# Patient Record
Sex: Male | Born: 1935 | Race: White | Hispanic: No | Marital: Married | Smoking: Former smoker
Health system: Southern US, Community
[De-identification: ages and names within clinical notes are randomized; demographics above are authoritative.]

## PROBLEM LIST (undated history)

## (undated) DIAGNOSIS — I5042 Chronic combined systolic (congestive) and diastolic (congestive) heart failure: Secondary | ICD-10-CM

## (undated) DIAGNOSIS — E785 Hyperlipidemia, unspecified: Secondary | ICD-10-CM

## (undated) DIAGNOSIS — N183 Chronic kidney disease, stage 3 unspecified: Secondary | ICD-10-CM

## (undated) DIAGNOSIS — K219 Gastro-esophageal reflux disease without esophagitis: Secondary | ICD-10-CM

## (undated) DIAGNOSIS — S68119A Complete traumatic metacarpophalangeal amputation of unspecified finger, initial encounter: Secondary | ICD-10-CM

## (undated) DIAGNOSIS — D638 Anemia in other chronic diseases classified elsewhere: Secondary | ICD-10-CM

## (undated) DIAGNOSIS — Z8719 Personal history of other diseases of the digestive system: Secondary | ICD-10-CM

## (undated) DIAGNOSIS — R410 Disorientation, unspecified: Secondary | ICD-10-CM

## (undated) DIAGNOSIS — G510 Bell's palsy: Secondary | ICD-10-CM

## (undated) DIAGNOSIS — I10 Essential (primary) hypertension: Secondary | ICD-10-CM

## (undated) DIAGNOSIS — I4821 Permanent atrial fibrillation: Secondary | ICD-10-CM

## (undated) DIAGNOSIS — J449 Chronic obstructive pulmonary disease, unspecified: Secondary | ICD-10-CM

## (undated) DIAGNOSIS — E119 Type 2 diabetes mellitus without complications: Secondary | ICD-10-CM

## (undated) DIAGNOSIS — J90 Pleural effusion, not elsewhere classified: Secondary | ICD-10-CM

## (undated) DIAGNOSIS — R0789 Other chest pain: Secondary | ICD-10-CM

## (undated) HISTORY — DX: Personal history of other diseases of the digestive system: Z87.19

## (undated) HISTORY — PX: APPENDECTOMY: SHX54

## (undated) HISTORY — DX: Type 2 diabetes mellitus without complications: E11.9

## (undated) HISTORY — DX: Bell's palsy: G51.0

## (undated) HISTORY — DX: Essential (primary) hypertension: I10

## (undated) HISTORY — DX: Chronic combined systolic (congestive) and diastolic (congestive) heart failure: I50.42

## (undated) HISTORY — DX: Hyperlipidemia, unspecified: E78.5

## (undated) HISTORY — DX: Other chest pain: R07.89

## (undated) HISTORY — DX: Chronic kidney disease, stage 3 unspecified: N18.30

## (undated) HISTORY — DX: Chronic kidney disease, stage 3 (moderate): N18.3

## (undated) HISTORY — DX: Complete traumatic metacarpophalangeal amputation of unspecified finger, initial encounter: S68.119A

## (undated) HISTORY — PX: FINGER SURGERY: SHX640

## (undated) HISTORY — DX: Pleural effusion, not elsewhere classified: J90

## (undated) HISTORY — DX: Permanent atrial fibrillation: I48.21

## (undated) HISTORY — DX: Gastro-esophageal reflux disease without esophagitis: K21.9

## (undated) HISTORY — DX: Anemia in other chronic diseases classified elsewhere: D63.8

## (undated) HISTORY — PX: ESOPHAGOGASTRODUODENOSCOPY: SHX1529

---

## 2004-01-12 ENCOUNTER — Ambulatory Visit: Payer: Self-pay | Admitting: Family Medicine

## 2004-06-11 ENCOUNTER — Ambulatory Visit: Payer: Self-pay | Admitting: Family Medicine

## 2004-09-11 ENCOUNTER — Ambulatory Visit: Payer: Self-pay

## 2005-03-28 ENCOUNTER — Ambulatory Visit: Payer: Self-pay | Admitting: Family Medicine

## 2005-06-26 ENCOUNTER — Ambulatory Visit: Payer: Self-pay | Admitting: Family Medicine

## 2005-10-07 ENCOUNTER — Ambulatory Visit: Payer: Self-pay | Admitting: Family Medicine

## 2005-12-16 ENCOUNTER — Ambulatory Visit: Payer: Self-pay

## 2006-04-30 ENCOUNTER — Ambulatory Visit: Payer: Self-pay | Admitting: Family Medicine

## 2006-08-19 ENCOUNTER — Ambulatory Visit: Payer: Self-pay | Admitting: Family Medicine

## 2006-09-11 ENCOUNTER — Ambulatory Visit: Payer: Self-pay | Admitting: Family Medicine

## 2006-09-23 ENCOUNTER — Ambulatory Visit: Payer: Self-pay | Admitting: Family Medicine

## 2006-10-24 ENCOUNTER — Ambulatory Visit: Payer: Self-pay | Admitting: Family Medicine

## 2007-01-05 ENCOUNTER — Ambulatory Visit: Payer: Self-pay | Admitting: Family Medicine

## 2007-04-01 ENCOUNTER — Ambulatory Visit: Payer: Self-pay | Admitting: Family Medicine

## 2008-10-31 ENCOUNTER — Other Ambulatory Visit: Payer: Self-pay | Admitting: Family Medicine

## 2008-12-21 ENCOUNTER — Ambulatory Visit: Payer: Self-pay | Admitting: Gastroenterology

## 2009-03-01 ENCOUNTER — Ambulatory Visit: Payer: Self-pay | Admitting: Gastroenterology

## 2009-04-06 ENCOUNTER — Other Ambulatory Visit: Payer: Self-pay | Admitting: Family Medicine

## 2009-04-20 ENCOUNTER — Other Ambulatory Visit: Payer: Self-pay | Admitting: Family Medicine

## 2009-04-25 ENCOUNTER — Ambulatory Visit: Payer: Self-pay | Admitting: Internal Medicine

## 2009-08-04 ENCOUNTER — Other Ambulatory Visit: Payer: Self-pay | Admitting: Family Medicine

## 2010-04-13 ENCOUNTER — Other Ambulatory Visit: Payer: Self-pay | Admitting: Family Medicine

## 2010-07-13 ENCOUNTER — Emergency Department: Payer: Self-pay | Admitting: Emergency Medicine

## 2010-12-17 ENCOUNTER — Other Ambulatory Visit: Payer: Self-pay | Admitting: Family Medicine

## 2010-12-21 ENCOUNTER — Emergency Department: Payer: Self-pay | Admitting: Emergency Medicine

## 2011-09-23 HISTORY — PX: COLONOSCOPY: SHX174

## 2011-10-11 ENCOUNTER — Ambulatory Visit: Payer: Self-pay | Admitting: Gastroenterology

## 2011-12-29 ENCOUNTER — Inpatient Hospital Stay: Payer: Self-pay | Admitting: Internal Medicine

## 2011-12-29 LAB — URINALYSIS, COMPLETE
Bacteria: NONE SEEN
Bilirubin,UR: NEGATIVE
Glucose,UR: 100 mg/dL (ref 0–75)
Leukocyte Esterase: NEGATIVE
Nitrite: NEGATIVE
RBC,UR: 4 /HPF (ref 0–5)
Specific Gravity: 1.02 (ref 1.003–1.030)
Squamous Epithelial: <1

## 2011-12-29 LAB — CK TOTAL AND CKMB (NOT AT ARMC)
CK, Total: 160 U/L (ref 35–232)
CK-MB: 1.5 ng/mL (ref 0.5–3.6)

## 2011-12-29 LAB — COMPREHENSIVE METABOLIC PANEL
Albumin: 3.2 g/dL — ABNORMAL LOW (ref 3.4–5.0)
Alkaline Phosphatase: 79 U/L (ref 50–136)
Anion Gap: 9 (ref 7–16)
BUN: 17 mg/dL (ref 7–18)
Bilirubin,Total: 0.8 mg/dL (ref 0.2–1.0)
Creatinine: 1.7 mg/dL — ABNORMAL HIGH (ref 0.60–1.30)
EGFR (African American): 44 — ABNORMAL LOW
SGOT(AST): 21 U/L (ref 15–37)
SGPT (ALT): 21 U/L (ref 12–78)
Sodium: 140 mmol/L (ref 136–145)
Total Protein: 7.2 g/dL (ref 6.4–8.2)

## 2011-12-29 LAB — TSH: Thyroid Stimulating Horm: 0.374 u[IU]/mL — ABNORMAL LOW

## 2011-12-29 LAB — T4, FREE: Free Thyroxine: 0.98 ng/dL (ref 0.76–1.46)

## 2011-12-29 LAB — CBC
HGB: 13.8 g/dL (ref 13.0–18.0)
MCH: 31.1 pg (ref 26.0–34.0)
RBC: 4.44 10*6/uL (ref 4.40–5.90)
WBC: 7.6 10*3/uL (ref 3.8–10.6)

## 2011-12-29 LAB — PROTIME-INR: Prothrombin Time: 13.4 secs (ref 11.5–14.7)

## 2011-12-30 DIAGNOSIS — I4891 Unspecified atrial fibrillation: Secondary | ICD-10-CM

## 2011-12-30 LAB — LIPID PANEL
Cholesterol: 97 mg/dL (ref 0–200)
HDL Cholesterol: 26 mg/dL — ABNORMAL LOW (ref 40–60)
Ldl Cholesterol, Calc: 48 mg/dL (ref 0–100)
Triglycerides: 116 mg/dL (ref 0–200)
VLDL Cholesterol, Calc: 23 mg/dL (ref 5–40)

## 2011-12-30 LAB — CBC WITH DIFFERENTIAL/PLATELET
Basophil %: 0.8 %
Eosinophil %: 0.3 %
HCT: 38.6 % — ABNORMAL LOW (ref 40.0–52.0)
Lymphocyte #: 0.8 10*3/uL — ABNORMAL LOW (ref 1.0–3.6)
MCH: 30.8 pg (ref 26.0–34.0)
MCV: 88 fL (ref 80–100)
Monocyte %: 9 %
Neutrophil #: 7.1 10*3/uL — ABNORMAL HIGH (ref 1.4–6.5)
Platelet: 190 10*3/uL (ref 150–440)
RBC: 4.38 10*6/uL — ABNORMAL LOW (ref 4.40–5.90)

## 2011-12-30 LAB — BASIC METABOLIC PANEL
BUN: 18 mg/dL (ref 7–18)
Calcium, Total: 8.2 mg/dL — ABNORMAL LOW (ref 8.5–10.1)
Chloride: 99 mmol/L (ref 98–107)
Co2: 26 mmol/L (ref 21–32)
Osmolality: 278 (ref 275–301)
Potassium: 3.7 mmol/L (ref 3.5–5.1)
Sodium: 136 mmol/L (ref 136–145)

## 2011-12-30 LAB — HEMOGLOBIN A1C: Hemoglobin A1C: 8.1 % — ABNORMAL HIGH (ref 4.2–6.3)

## 2011-12-30 LAB — CK TOTAL AND CKMB (NOT AT ARMC)
CK, Total: 118 U/L (ref 35–232)
CK-MB: 0.9 ng/mL (ref 0.5–3.6)

## 2011-12-30 LAB — URINE CULTURE

## 2011-12-30 LAB — RAPID INFLUENZA A&B ANTIGENS

## 2011-12-30 LAB — TROPONIN I: Troponin-I: 0.04 ng/mL

## 2011-12-31 LAB — MAGNESIUM: Magnesium: 1.9 mg/dL

## 2011-12-31 LAB — BASIC METABOLIC PANEL
Anion Gap: 12 (ref 7–16)
Calcium, Total: 8.4 mg/dL — ABNORMAL LOW (ref 8.5–10.1)
Co2: 25 mmol/L (ref 21–32)
Creatinine: 1.61 mg/dL — ABNORMAL HIGH (ref 0.60–1.30)
EGFR (African American): 47 — ABNORMAL LOW
Glucose: 177 mg/dL — ABNORMAL HIGH (ref 65–99)
Osmolality: 279 (ref 275–301)
Potassium: 3.2 mmol/L — ABNORMAL LOW (ref 3.5–5.1)

## 2012-01-01 LAB — MAGNESIUM: Magnesium: 2.5 mg/dL — ABNORMAL HIGH

## 2012-01-01 LAB — CBC WITH DIFFERENTIAL/PLATELET
Basophil #: 0 10*3/uL (ref 0.0–0.1)
Eosinophil #: 0.1 10*3/uL (ref 0.0–0.7)
Eosinophil %: 2.4 %
HGB: 14.4 g/dL (ref 13.0–18.0)
Lymphocyte #: 1.1 10*3/uL (ref 1.0–3.6)
MCH: 31.3 pg (ref 26.0–34.0)
MCHC: 35.2 g/dL (ref 32.0–36.0)
MCV: 89 fL (ref 80–100)
Monocyte #: 0.7 x10 3/mm (ref 0.2–1.0)
Neutrophil %: 61.4 %
Platelet: 211 10*3/uL (ref 150–440)
RBC: 4.6 10*6/uL (ref 4.40–5.90)
RDW: 14.4 % (ref 11.5–14.5)

## 2012-01-01 LAB — BASIC METABOLIC PANEL
Anion Gap: 9 (ref 7–16)
Calcium, Total: 8.8 mg/dL (ref 8.5–10.1)
Creatinine: 1.72 mg/dL — ABNORMAL HIGH (ref 0.60–1.30)
EGFR (Non-African Amer.): 37 — ABNORMAL LOW
Glucose: 198 mg/dL — ABNORMAL HIGH (ref 65–99)
Osmolality: 290 (ref 275–301)
Potassium: 3.5 mmol/L (ref 3.5–5.1)

## 2012-01-05 LAB — CULTURE, BLOOD (SINGLE)

## 2012-01-15 ENCOUNTER — Ambulatory Visit (INDEPENDENT_AMBULATORY_CARE_PROVIDER_SITE_OTHER): Payer: Medicare Other | Admitting: Cardiovascular Disease

## 2012-01-15 ENCOUNTER — Encounter: Payer: Self-pay | Admitting: Cardiovascular Disease

## 2012-01-15 VITALS — BP 142/58 | HR 68 | Ht 68.0 in | Wt 214.0 lb

## 2012-01-15 DIAGNOSIS — I4819 Other persistent atrial fibrillation: Secondary | ICD-10-CM | POA: Insufficient documentation

## 2012-01-15 DIAGNOSIS — E119 Type 2 diabetes mellitus without complications: Secondary | ICD-10-CM | POA: Insufficient documentation

## 2012-01-15 DIAGNOSIS — I10 Essential (primary) hypertension: Secondary | ICD-10-CM | POA: Insufficient documentation

## 2012-01-15 DIAGNOSIS — E785 Hyperlipidemia, unspecified: Secondary | ICD-10-CM

## 2012-01-15 DIAGNOSIS — E782 Mixed hyperlipidemia: Secondary | ICD-10-CM | POA: Insufficient documentation

## 2012-01-15 DIAGNOSIS — I4891 Unspecified atrial fibrillation: Secondary | ICD-10-CM

## 2012-01-15 NOTE — Progress Notes (Signed)
Patient ID: Patrick [name], male    DOB: 1931/11/02, 78 y.o.   MRN: [id number]  HPI Comments: Mr. Krishnamoorthy is a very pleasant 78 year old gentleman with history of hypertension, diabetes, hyperlipidemia, GERD who presented recently to the hospital October 7 with weakness, dizziness, found to be in atrial fibrillation with RVR. He was started on rate controlling medications and discharged on Cardizem 300 mg daily with anticoagulation (xarelto). He presents to establish care in our office.  He reports that he feels very well. He has been walking with his wife and denies any significant shortness of breath, edema, palpitations or tachycardia. He reports his blood pressure has been well-controlled at home. Overall he has no complaints.  CT scan of the head in the hospital showed no intracranial process Hemoglobin A1c 8.1 Total cholesterol 97, LDL 48 Creatinine 1.61, BUN 30  Echocardiogram showed ejection fraction greater than 55%, mildly dilated left atrium, otherwise normal study  MRI of the brain showed chronic changes without evidence of acute abnormalities  EKG today shows atrial fibrillation with ventricular rate 68 minute, poor R-wave progression through the anterior precordial leads   Outpatient Encounter Prescriptions as of 01/15/2012  Medication Sig Dispense Refill  . aspirin 81 MG tablet Take 81 mg by mouth daily.      Marland Kitchen diltiazem (TIAZAC) 300 MG 24 hr capsule Take 300 mg by mouth daily.      . enalapril (VASOTEC) 20 MG tablet Take 20 mg by mouth daily.      Marland Kitchen glipiZIDE (GLUCOTROL) 10 MG tablet Take 10 mg by mouth 2 (two) times daily before a meal.       . hydrALAZINE (APRESOLINE) 50 MG tablet Take 50 mg by mouth 3 (three) times daily.      . hydrochlorothiazide (HYDRODIURIL) 25 MG tablet Take 25 mg by mouth daily.      . insulin lispro protamine-insulin lispro (HUMALOG 50/50) (50-50) 100 UNIT/ML SUSP Inject 66 Units into the skin 2 (two) times daily before a meal.      .  levofloxacin (LEVAQUIN) 750 MG tablet Take 750 mg by mouth daily.      Marland Kitchen omeprazole (PRILOSEC) 40 MG capsule Take 40 mg by mouth daily.      . pravastatin (PRAVACHOL) 40 MG tablet Take 40 mg by mouth daily.      . Rivaroxaban (XARELTO) 15 MG TABS tablet Take 15 mg by mouth daily.        Review of Systems  Constitutional: Negative.   HENT: Negative.   Eyes: Negative.   Respiratory: Negative.   Cardiovascular: Negative.   Gastrointestinal: Negative.   Musculoskeletal: Negative.   Skin: Negative.   Neurological: Negative.   Hematological: Negative.   Psychiatric/Behavioral: Negative.   All other systems reviewed and are negative.    BP 142/58  Pulse 68  Ht 5\' 8"  (1.727 m)  Wt 214 lb (97.07 kg)  BMI 32.54 kg/m2  Physical Exam  Nursing note and vitals reviewed. Constitutional: He is oriented to person, place, and time. He appears well-developed and well-nourished.  HENT:  Head: Normocephalic.  Nose: Nose normal.  Mouth/Throat: Oropharynx is clear and moist.  Eyes: Conjunctivae normal are normal. Pupils are equal, round, and reactive to light.  Neck: Normal range of motion. Neck supple. No JVD present.  Cardiovascular: Normal rate, S1 normal, S2 normal, normal heart sounds and intact distal pulses.  An irregularly irregular rhythm present. Exam reveals no gallop and no friction rub.   No murmur heard. Pulmonary/Chest: Effort  normal and breath sounds normal. No respiratory distress. He has no wheezes. He has no rales. He exhibits no tenderness.  Abdominal: Soft. Bowel sounds are normal. He exhibits no distension. There is no tenderness.  Musculoskeletal: Normal range of motion. He exhibits no edema and no tenderness.  Lymphadenopathy:    He has no cervical adenopathy.  Neurological: He is alert and oriented to person, place, and time. Coordination normal.  Skin: Skin is warm and dry. No rash noted. No erythema.  Psychiatric: He has a normal mood and affect. His behavior is  normal. Judgment and thought content normal.           Assessment and Plan

## 2012-01-15 NOTE — Assessment & Plan Note (Signed)
We have encouraged continued exercise, careful diet management in an effort to lose weight. He has started walking on a regular basis.

## 2012-01-15 NOTE — Assessment & Plan Note (Signed)
Cholesterol is at goal on the current lipid regimen. No changes to the medications were made.  

## 2012-01-15 NOTE — Assessment & Plan Note (Signed)
Rate is well controlled. He is on anticoagulation. We will meet again with him in one month to discuss options. He has 3 options including medical management as he is doing now, attempted cardioversion pharmacologically, or DC cardioversion. He is currently asymptomatic.

## 2012-01-15 NOTE — Assessment & Plan Note (Signed)
Blood pressure is well controlled on today's visit. No changes made to the medications. 

## 2012-01-15 NOTE — Patient Instructions (Addendum)
You are doing well. No medication changes were made. Call the office for dizziness, shortness of breath, edema  Please call us if you have new issues that need to be addressed before your next appt.  Your physician wants you to follow-up in: 1 months.

## 2012-01-20 ENCOUNTER — Encounter: Payer: Self-pay | Admitting: Cardiovascular Disease

## 2012-01-22 ENCOUNTER — Encounter: Payer: Self-pay | Admitting: Cardiovascular Disease

## 2012-02-11 ENCOUNTER — Encounter: Payer: Self-pay | Admitting: *Deleted

## 2012-02-17 ENCOUNTER — Encounter: Payer: Self-pay | Admitting: Cardiovascular Disease

## 2012-02-17 ENCOUNTER — Ambulatory Visit (INDEPENDENT_AMBULATORY_CARE_PROVIDER_SITE_OTHER): Payer: Medicare Other | Admitting: Cardiovascular Disease

## 2012-02-17 VITALS — BP 160/78 | HR 59 | Ht 68.0 in | Wt 219.4 lb

## 2012-02-17 DIAGNOSIS — E785 Hyperlipidemia, unspecified: Secondary | ICD-10-CM

## 2012-02-17 DIAGNOSIS — E119 Type 2 diabetes mellitus without complications: Secondary | ICD-10-CM

## 2012-02-17 DIAGNOSIS — I4891 Unspecified atrial fibrillation: Secondary | ICD-10-CM

## 2012-02-17 DIAGNOSIS — I10 Essential (primary) hypertension: Secondary | ICD-10-CM

## 2012-02-17 NOTE — Assessment & Plan Note (Signed)
We have encouraged continued exercise, careful diet management in an effort to lose weight. 

## 2012-02-17 NOTE — Progress Notes (Signed)
Patient ID: Patrick [name], male    DOB: 09/15/1931, 78 y.o.   MRN: [id number]  HPI Comments: Patrick [name] is a very pleasant 78 year old gentleman with history of hypertension, diabetes, hyperlipidemia, GERD who presented recently to the hospital December 30 2011 with weakness, dizziness, found to be in atrial fibrillation with RVR. He was started on rate controlling medications and discharged on Cardizem 300 mg daily with anticoagulation (xarelto). He presents for routine followup  He reports that he feels very well. He has been walking with his wife and denies any significant shortness of breath, edema, palpitations or tachycardia.Overall he has no complaints.  He was recently seen by Dr. [name] and told to stop his HCTZ. Instead he stopped his hydralazine. He was taking 50 mg 3 times a day. He does not have any HCTZ in a bag of medications. He does report mild lower extremity edema.  CT scan of the head in the hospital showed no intracranial process Lab work October 2013, creatinine 2.2, BUN 34 Lab work August 2013 , Hemoglobin A1c 8.1 Total cholesterol 97, LDL 48  Echocardiogram showed ejection fraction greater than 55%, mildly dilated left atrium, otherwise normal study MRI of the brain showed chronic changes without evidence of acute abnormalities  EKG today shows atrial fibrillation with ventricular rate 59 minute, nonspecific ST abnormality   Outpatient Encounter Prescriptions as of 02/17/2012  Medication Sig Dispense Refill  . aspirin 81 MG tablet Take 81 mg by mouth daily.      Marland Kitchen diltiazem (TIAZAC) 300 MG 24 hr capsule Take 300 mg by mouth daily.      . enalapril (VASOTEC) 20 MG tablet Take 20 mg by mouth daily.      Marland Kitchen glipiZIDE (GLUCOTROL) 10 MG tablet Take 10 mg by mouth 2 (two) times daily before a meal.       . insulin lispro protamine-insulin lispro (HUMALOG 50/50) (50-50) 100 UNIT/ML SUSP Inject 66 Units into the skin 2 (two) times daily before a meal.      .  omeprazole (PRILOSEC) 40 MG capsule Take 40 mg by mouth daily.      . pravastatin (PRAVACHOL) 40 MG tablet Take 40 mg by mouth daily.      . Rivaroxaban (XARELTO) 15 MG TABS tablet Take 15 mg by mouth daily.      . hydrALAZINE (APRESOLINE) 50 MG tablet Take 1 tablet (50 mg total) by mouth 3 (three) times daily. He held this by accident recently   270 tablet  3    Review of Systems  Constitutional: Negative.   HENT: Negative.   Eyes: Negative.   Respiratory: Negative.   Cardiovascular: Negative.   Gastrointestinal: Negative.   Musculoskeletal: Negative.   Skin: Negative.   Neurological: Negative.   Hematological: Negative.   Psychiatric/Behavioral: Negative.   All other systems reviewed and are negative.    BP 160/78  Pulse 59  Ht 5\' 8"  (1.727 m)  Wt 219 lb 6.4 oz (99.519 kg)  BMI 33.36 kg/m2  Physical Exam  Nursing note and vitals reviewed. Constitutional: He is oriented to person, place, and time. He appears well-developed and well-nourished.  HENT:  Head: Normocephalic.  Nose: Nose normal.  Mouth/Throat: Oropharynx is clear and moist.  Eyes: Conjunctivae normal are normal. Pupils are equal, round, and reactive to light.  Neck: Normal range of motion. Neck supple. No JVD present.  Cardiovascular: Normal rate, S1 normal, S2 normal, normal heart sounds and intact distal pulses.  An irregularly irregular rhythm present.  Exam reveals no gallop and no friction rub.   No murmur heard.      Trace bilateral LE edema   Pulmonary/Chest: Effort normal and breath sounds normal. No respiratory distress. He has no wheezes. He has no rales. He exhibits no tenderness.  Abdominal: Soft. Bowel sounds are normal. He exhibits no distension. There is no tenderness.  Musculoskeletal: Normal range of motion. He exhibits no edema and no tenderness.  Lymphadenopathy:    He has no cervical adenopathy.  Neurological: He is alert and oriented to person, place, and time. Coordination normal.    Skin: Skin is warm and dry. No rash noted. No erythema.  Psychiatric: He has a normal mood and affect. His behavior is normal. Judgment and thought content normal.           Assessment and Plan

## 2012-02-17 NOTE — Patient Instructions (Addendum)
You are doing well. Please go back on hydralazine three times a day  If you get worsening edema, call the office   Please call us if you have new issues that need to be addressed before your next appt.  Your physician wants you to follow-up in: 3 months.  You will receive a reminder letter in the mail two months in advance. If you don't receive a letter, please call our office to schedule the follow-up appointment.

## 2012-02-17 NOTE — Assessment & Plan Note (Signed)
Cholesterol is at goal on the current lipid regimen. No changes to the medications were made.  

## 2012-02-17 NOTE — Assessment & Plan Note (Signed)
Blood pressure is elevated today. We have suggested he restart hydralazine 50 mg 3 times a day. He accidentally stopped this medication thinking it was HCTZ. He was told to stop the HCTZ by Dr. Juleen China.

## 2012-02-17 NOTE — Assessment & Plan Note (Signed)
Again we have discussed management of atrial fibrillation. He is not particularly interested in antiarrhythmic medication or shock/cardioversion. He feels well and we will just monitor him for now on his current medications. We have suggested if his edema gets worse, we would decrease the dose of his calcium channel blocker, possibly start metoprolol instead.

## 2012-02-18 ENCOUNTER — Encounter: Payer: Self-pay | Admitting: Cardiovascular Disease

## 2012-05-05 ENCOUNTER — Other Ambulatory Visit: Payer: Self-pay

## 2012-05-05 MED ORDER — RIVAROXABAN 15 MG PO TABS: 15.0000 mg | ORAL_TABLET | Freq: Every day | ORAL | Status: DC

## 2012-05-22 ENCOUNTER — Ambulatory Visit (INDEPENDENT_AMBULATORY_CARE_PROVIDER_SITE_OTHER): Payer: Medicare PPO | Admitting: Cardiovascular Disease

## 2012-05-22 ENCOUNTER — Encounter: Payer: Self-pay | Admitting: Cardiovascular Disease

## 2012-05-22 VITALS — BP 142/78 | HR 78 | Ht 68.0 in | Wt 232.5 lb

## 2012-05-22 DIAGNOSIS — E785 Hyperlipidemia, unspecified: Secondary | ICD-10-CM

## 2012-05-22 MED ORDER — HYDRALAZINE HCL 100 MG PO TABS: 100.0000 mg | ORAL_TABLET | Freq: Three times a day (TID) | ORAL | Status: DC

## 2012-05-22 MED ORDER — TORSEMIDE 20 MG PO TABS: 20.0000 mg | ORAL_TABLET | Freq: Two times a day (BID) | ORAL | Status: DC | PRN

## 2012-05-22 NOTE — Progress Notes (Signed)
Patient ID: Patrick [name], male    DOB: January 26, 1932, 78 y.o.   MRN: [id number]  HPI Comments: Patrick [name] is a very pleasant 79 year old gentleman with history of hypertension, diabetes, hyperlipidemia, GERD who presented recently to the hospital December 30 2011 with weakness, dizziness, found to be in atrial fibrillation with RVR. He was started on rate controlling medications and discharged on Cardizem 300 mg daily with anticoagulation (xarelto). He presents for routine followup  Overall,He reports that he feels very well. His weight is up 14 pounds from his prior clinic visit. Legs are more swollen. Denies having worsening shortness of breath. Wife did not notice his leg swelling. No other new medications. Reports his blood pressure has been reasonably well controlled.he is not interested in any cardioversion for atrial fibrillation  CT scan of the head in the hospital showed no intracranial process Lab work October 2013, creatinine 2.2, BUN 34 Lab work August 2013 , Hemoglobin A1c 8.1 Total cholesterol 97, LDL 48  Echocardiogram showed ejection fraction greater than 55%, mildly dilated left atrium, otherwise normal study MRI of the brain showed chronic changes without evidence of acute abnormalities  EKG today shows atrial fibrillation with ventricular rate 78 minute, nonspecific ST abnormality   Outpatient Encounter Prescriptions as of 05/22/2012  Medication Sig Dispense Refill  . aspirin 81 MG tablet Take 81 mg by mouth daily.      . cyclobenzaprine (FLEXERIL) 5 MG tablet Take 5 mg by mouth as needed for muscle spasms.      Marland Kitchen diltiazem (TIAZAC) 300 MG 24 hr capsule Take 300 mg by mouth daily.      . enalapril (VASOTEC) 20 MG tablet Take 20 mg by mouth daily.      Marland Kitchen glipiZIDE (GLUCOTROL) 10 MG tablet Take 10 mg by mouth 2 (two) times daily before a meal.       . hydrALAZINE (APRESOLINE) 100 MG tablet Take 1 tablet (100 mg total) by mouth 3 (three) times daily.  270 tablet  3   . insulin lispro protamine-insulin lispro (HUMALOG 50/50) (50-50) 100 UNIT/ML SUSP Inject 60 Units into the skin 2 (two) times daily before a meal.       . omeprazole (PRILOSEC) 40 MG capsule Take 40 mg by mouth daily.      . pravastatin (PRAVACHOL) 40 MG tablet Take 40 mg by mouth daily.      . Rivaroxaban (XARELTO) 15 MG TABS tablet Take 1 tablet (15 mg total) by mouth daily.  30 tablet  3  . Vitamin D, Ergocalciferol, (DRISDOL) 50000 UNITS CAPS Take 50,000 Units by mouth every 30 (thirty) days.      . [DISCONTINUED] hydrALAZINE (APRESOLINE) 50 MG tablet Take 1 tablet (50 mg total) by mouth 3 (three) times daily.  270 tablet  3    Review of Systems  Constitutional: Negative.   HENT: Negative.   Eyes: Negative.   Respiratory: Negative.   Cardiovascular: Positive for leg swelling.  Gastrointestinal: Negative.   Musculoskeletal: Negative.   Skin: Negative.   Neurological: Negative.   Psychiatric/Behavioral: Negative.   All other systems reviewed and are negative.    BP 142/78  Pulse 78  Ht 5\' 8"  (1.727 m)  Wt 232 lb 8 oz (105.461 kg)  BMI 35.36 kg/m2  Physical Exam  Nursing note and vitals reviewed. Constitutional: He is oriented to person, place, and time. He appears well-developed and well-nourished.  HENT:  Head: Normocephalic.  Nose: Nose normal.  Mouth/Throat: Oropharynx is clear and moist.  Eyes: Conjunctivae are normal. Pupils are equal, round, and reactive to light.  Neck: Normal range of motion. Neck supple. No JVD present.  Cardiovascular: Normal rate, S1 normal, S2 normal, normal heart sounds and intact distal pulses.  An irregularly irregular rhythm present. Exam reveals no gallop and no friction rub.   No murmur heard. 1-2+ pitting bilateral LE edema   Pulmonary/Chest: Effort normal and breath sounds normal. No respiratory distress. He has no wheezes. He has no rales. He exhibits no tenderness.  Abdominal: Soft. Bowel sounds are normal. He exhibits no  distension. There is no tenderness.  Musculoskeletal: Normal range of motion. He exhibits no edema and no tenderness.  Lymphadenopathy:    He has no cervical adenopathy.  Neurological: He is alert and oriented to person, place, and time. Coordination normal.  Skin: Skin is warm and dry. No rash noted. No erythema.  Psychiatric: He has a normal mood and affect. His behavior is normal. Judgment and thought content normal.      Assessment and Plan

## 2012-05-22 NOTE — Patient Instructions (Addendum)
Please increase  hydralazine to 100 mg three times a day Please start torsemide one a day with banana one a day for edema (leg swelling) Monitor the weight When you get down 10 pounds, call the office for a lab check   Please call us if you have new issues that need to be addressed before your next appt.  Your physician wants you to follow-up in: 1 month.

## 2012-05-22 NOTE — Assessment & Plan Note (Signed)
Blood pressure is well controlled on today's visit. No changes made to the medications. Adding diuretic.

## 2012-05-22 NOTE — Assessment & Plan Note (Addendum)
Heart rate is reasonably well controlled. I'm concerned about leg edema. Suspect this is from diastolic heart failure. Weight is up 14 pounds. We'll start torsemide 20 mg daily. He will take over-the-counter potassium or banana/citrus. If no improvement with diuresis, we may need to change his diltiazem as this could be a side effect of calcium channel blocker.continue on anticoagulation

## 2012-05-22 NOTE — Assessment & Plan Note (Signed)
We have encouraged continued exercise, careful diet management in an effort to lose weight. 

## 2012-05-22 NOTE — Assessment & Plan Note (Signed)
We have suggested he continue on his pravastatin

## 2012-06-10 ENCOUNTER — Telehealth: Payer: Self-pay

## 2012-06-10 NOTE — Telephone Encounter (Signed)
I called pt to assess weight loss since diuretic med change Says he is down "some" but not down 10 pounds Has appt with [place] 3/31 Will call us should his weight drop 10 pounds for a BMP check  Otherwise we will see him 3/31 as scheduled

## 2012-06-11 ENCOUNTER — Telehealth: Payer: Self-pay | Admitting: *Deleted

## 2012-06-11 NOTE — Telephone Encounter (Signed)
See below

## 2012-06-11 NOTE — Telephone Encounter (Signed)
Pt wife says that he was to call when pt lost 10 lbs he was to call dr. Abbott Pao will lose couple lbs and then gain it back. Wants to know what to do

## 2012-06-11 NOTE — Telephone Encounter (Signed)
Please advise Also, see note from 3/19

## 2012-06-16 NOTE — Telephone Encounter (Signed)
Need to track his weight since starting torsemide If there has been no improvement in his weight and still with significant leg edema, may need to increase torsemide to 20 mg twice a day with KCl 20 daily We'll need basic metabolic panel in 2 weeks Goal is to reduce his edema and keep it there Would minimize fluid intake and call back and or write down daily weights

## 2012-06-17 NOTE — Telephone Encounter (Signed)
No answer

## 2012-06-22 ENCOUNTER — Encounter: Payer: Self-pay | Admitting: Cardiovascular Disease

## 2012-06-22 ENCOUNTER — Ambulatory Visit (INDEPENDENT_AMBULATORY_CARE_PROVIDER_SITE_OTHER): Payer: Medicare PPO | Admitting: Cardiovascular Disease

## 2012-06-22 VITALS — BP 130/60 | HR 81 | Ht 68.0 in | Wt 226.2 lb

## 2012-06-22 DIAGNOSIS — E119 Type 2 diabetes mellitus without complications: Secondary | ICD-10-CM

## 2012-06-22 DIAGNOSIS — I4891 Unspecified atrial fibrillation: Secondary | ICD-10-CM

## 2012-06-22 DIAGNOSIS — I10 Essential (primary) hypertension: Secondary | ICD-10-CM

## 2012-06-22 DIAGNOSIS — E785 Hyperlipidemia, unspecified: Secondary | ICD-10-CM

## 2012-06-22 MED ORDER — METOPROLOL TARTRATE 50 MG PO TABS: 75.0000 mg | ORAL_TABLET | Freq: Two times a day (BID) | ORAL | Status: DC

## 2012-06-22 NOTE — Assessment & Plan Note (Signed)
Continue Pravachol daily

## 2012-06-22 NOTE — Assessment & Plan Note (Addendum)
Chronic atrial fibrillation, adequate rate control.he does have lower stone edema. We will hold his diltiazem when his prescription is done and change him to metoprolol tartrate twice a day. We'll start 75 mg twice a day. If Heart rate or blood pressure runs low, he would decrease the dose to 50 mg twice a day

## 2012-06-22 NOTE — Assessment & Plan Note (Signed)
We have encouraged continued exercise, careful diet management in an effort to lose weight. 

## 2012-06-22 NOTE — Patient Instructions (Addendum)
When you run out of Taztia/diltiazem, do not refill (could cause leg swelling) Then start metoprolol 75 mg twice a day (1 1/2 of the 50 mg pills)  If heart rate is too slow on metoprolol 75 twice a day,  Decrease the dose to 50 mg twice a day  Decrease the torsemide back to one a day  Please call us if you have new issues that need to be addressed before your next appt.  Your physician wants you to follow-up in: 2 months.

## 2012-06-22 NOTE — Assessment & Plan Note (Signed)
We'll hold diltiazem secondary to leg edema and start metoprolol tartrate twice a day

## 2012-06-22 NOTE — Progress Notes (Signed)
Patient ID: Patrick [name], male    DOB: 05/03/1931, 79 y.o.   MRN: [id number]  HPI Comments: Mr. Bemiss is a very pleasant 79 year old gentleman with history of hypertension, diabetes, hyperlipidemia, GERD who presented recently to the hospital December 30 2011 with weakness, dizziness, found to be in atrial fibrillation with RVR. He was started on rate controlling medications and discharged on Cardizem 300 mg daily with anticoagulation (xarelto). He presents for routine followup  On his last clinic visit, he had worsening lower extremity edema. Wife seems to think the edema could have started back in October 2013. We started him on torsemide, increasing the dose to twice a day. No significant improvement in his weight or edema. Continues to have pitting edema. No dramatic shortness of breath. Otherwise he is asymptomatic. He is not interested in cardioversion. Weight is approximately 221 pounds at home.  CT scan of the head in the hospital showed no intracranial process Lab work October 2013, creatinine 2.2, BUN 34 Lab work August 2013 , Hemoglobin A1c 8.1 Total cholesterol 97, LDL 48  Echocardiogram showed ejection fraction greater than 55%, mildly dilated left atrium, otherwise normal study MRI of the brain showed chronic changes without evidence of acute abnormalities  EKG today shows atrial fibrillation with ventricular rate 81 minute, nonspecific ST abnormality   Outpatient Encounter Prescriptions as of 06/22/2012  Medication Sig Dispense Refill  . aspirin 81 MG tablet Take 81 mg by mouth daily.      . cyclobenzaprine (FLEXERIL) 5 MG tablet Take 5 mg by mouth as needed for muscle spasms.      . enalapril (VASOTEC) 20 MG tablet Take 40 mg by mouth daily.       Marland Kitchen glipiZIDE (GLUCOTROL) 10 MG tablet Take 10 mg by mouth 2 (two) times daily before a meal.       . hydrALAZINE (APRESOLINE) 100 MG tablet Take 1 tablet (100 mg total) by mouth 3 (three) times daily.  270 tablet  3  .  insulin lispro protamine-insulin lispro (HUMALOG 50/50) (50-50) 100 UNIT/ML SUSP Inject 70 Units into the skin 2 (two) times daily before a meal.       . omeprazole (PRILOSEC) 40 MG capsule Take 40 mg by mouth daily.      . pravastatin (PRAVACHOL) 40 MG tablet Take 40 mg by mouth daily.      . Rivaroxaban (XARELTO) 15 MG TABS tablet Take 1 tablet (15 mg total) by mouth daily.  30 tablet  3  . torsemide (DEMADEX) 20 MG tablet Take 1 tablet (20 mg total) by mouth 2 (two) times daily as needed.  60 tablet  6  . Vitamin D, Ergocalciferol, (DRISDOL) 50000 UNITS CAPS Take 50,000 Units by mouth every 30 (thirty) days.      Marland Kitchen  diltiazem (TIAZAC) 300 MG 24 hr capsule Take 300 mg by mouth daily.       Review of Systems  Constitutional: Negative.   HENT: Negative.   Eyes: Negative.   Respiratory: Negative.   Cardiovascular: Positive for leg swelling.  Gastrointestinal: Negative.   Musculoskeletal: Negative.   Skin: Negative.   Neurological: Negative.   Psychiatric/Behavioral: Negative.   All other systems reviewed and are negative.    BP 130/60  Pulse 81  Ht 5\' 8"  (1.727 m)  Wt 226 lb 4 oz (102.626 kg)  BMI 34.41 kg/m2  Physical Exam  Nursing note and vitals reviewed. Constitutional: He is oriented to person, place, and time. He appears well-developed and well-nourished.  HENT:  Head: Normocephalic.  Nose: Nose normal.  Mouth/Throat: Oropharynx is clear and moist.  Eyes: Conjunctivae are normal. Pupils are equal, round, and reactive to light.  Neck: Normal range of motion. Neck supple. No JVD present.  Cardiovascular: Normal rate, S1 normal, S2 normal, normal heart sounds and intact distal pulses.  An irregularly irregular rhythm present. Exam reveals no gallop and no friction rub.   No murmur heard. 1- pitting bilateral LE edema   Pulmonary/Chest: Effort normal and breath sounds normal. No respiratory distress. He has no wheezes. He has no rales. He exhibits no tenderness.  Abdominal:  Soft. Bowel sounds are normal. He exhibits no distension. There is no tenderness.  Musculoskeletal: Normal range of motion. He exhibits no edema and no tenderness.  Lymphadenopathy:    He has no cervical adenopathy.  Neurological: He is alert and oriented to person, place, and time. Coordination normal.  Skin: Skin is warm and dry. No rash noted. No erythema.  Psychiatric: He has a normal mood and affect. His behavior is normal. Judgment and thought content normal.      Assessment and Plan

## 2012-06-22 NOTE — Telephone Encounter (Signed)
Pt in office

## 2012-08-20 ENCOUNTER — Emergency Department: Payer: Self-pay | Admitting: Emergency Medicine

## 2012-08-20 LAB — BASIC METABOLIC PANEL
Anion Gap: 7 (ref 7–16)
BUN: 28 mg/dL — ABNORMAL HIGH (ref 7–18)
Co2: 30 mmol/L (ref 21–32)
Creatinine: 2.98 mg/dL — ABNORMAL HIGH (ref 0.60–1.30)
EGFR (African American): 22 — ABNORMAL LOW
EGFR (Non-African Amer.): 19 — ABNORMAL LOW
Osmolality: 285 (ref 275–301)

## 2012-08-20 LAB — CBC
HCT: 26.3 % — ABNORMAL LOW (ref 40.0–52.0)
HGB: 8.3 g/dL — ABNORMAL LOW (ref 13.0–18.0)
MCH: 23.3 pg — ABNORMAL LOW (ref 26.0–34.0)
RBC: 3.56 10*6/uL — ABNORMAL LOW (ref 4.40–5.90)
RDW: 17.7 % — ABNORMAL HIGH (ref 11.5–14.5)
WBC: 7.8 10*3/uL (ref 3.8–10.6)

## 2012-08-24 ENCOUNTER — Encounter: Payer: Self-pay | Admitting: Cardiovascular Disease

## 2012-08-24 ENCOUNTER — Ambulatory Visit (INDEPENDENT_AMBULATORY_CARE_PROVIDER_SITE_OTHER): Payer: Medicare PPO | Admitting: Cardiovascular Disease

## 2012-08-24 VITALS — BP 118/72 | HR 112 | Ht 68.0 in | Wt 225.2 lb

## 2012-08-24 DIAGNOSIS — E119 Type 2 diabetes mellitus without complications: Secondary | ICD-10-CM

## 2012-08-24 DIAGNOSIS — I10 Essential (primary) hypertension: Secondary | ICD-10-CM

## 2012-08-24 DIAGNOSIS — E785 Hyperlipidemia, unspecified: Secondary | ICD-10-CM

## 2012-08-24 DIAGNOSIS — I4891 Unspecified atrial fibrillation: Secondary | ICD-10-CM

## 2012-08-24 MED ORDER — METOPROLOL TARTRATE 50 MG PO TABS: 50.0000 mg | ORAL_TABLET | Freq: Two times a day (BID) | ORAL | Status: DC

## 2012-08-24 NOTE — Assessment & Plan Note (Signed)
Rate is poorly controlled. Will start metoprolol tartrate 50 mg twice a day.  Hold enalapril for now as notes provided by him showed labile blood pressure sometimes down to [id number] systolic

## 2012-08-24 NOTE — Progress Notes (Signed)
Patient ID: Patrick [name], male    DOB: May 11, 1931, 79 y.o.   MRN: [id number]  HPI Comments: Patrick [name] is a very pleasant 79 year old gentleman with history of hypertension, diabetes, hyperlipidemia, GERD who presented to the hospital December 30 2011 with weakness, dizziness, found to be in atrial fibrillation with RVR. He was started on rate controlling medications and discharged on Cardizem 300 mg daily with anticoagulation (xarelto). He presents for routine followup  On a prior clinic visit, he had worsening lower extremity edema. Diltiazem was held and he was started on higher dose metoprolol. Edema has improved in followup today. He is currently not taking metoprolol or diltiazem. Heart rate has been elevated. Recent diagnosis of Bell's palsy and fall. He hit the left shin and has a bandage over this. He continues to take torsemide daily. Weight has been relatively stable in the 220 range Was previously in atrial fibrillation and not interested in cardioversion on previous office visits He has been tolerating anticoagulation at renal dosing.  CT scan of the head in the hospital showed no intracranial process Lab work October 2013, creatinine 2.2, BUN 34 Lab work August 2013 , Hemoglobin A1c 8.1 Total cholesterol 97, LDL 48  Echocardiogram showed ejection fraction greater than 55%, mildly dilated left atrium, otherwise normal study MRI of the brain showed chronic changes without evidence of acute abnormalities  EKG today shows atrial fibrillation with ventricular rate 112 minute, nonspecific ST abnormality   Outpatient Encounter Prescriptions as of 08/24/2012  Medication Sig Dispense Refill  . aspirin 81 MG tablet Take 81 mg by mouth daily.      . cyclobenzaprine (FLEXERIL) 5 MG tablet Take 5 mg by mouth as needed for muscle spasms.      Marland Kitchen erythromycin [facility]) ophthalmic ointment Place 1 application into the left eye 2 (two) times daily. 7 days.      Marland Kitchen glipiZIDE  (GLUCOTROL) 10 MG tablet Take 10 mg by mouth 2 (two) times daily before a meal.       . hydrALAZINE (APRESOLINE) 100 MG tablet Take 1 tablet (100 mg total) by mouth 3 (three) times daily.  270 tablet  3  . insulin lispro protamine-insulin lispro (HUMALOG 50/50) (50-50) 100 UNIT/ML SUSP Inject 70 Units into the skin 2 (two) times daily before a meal.       . omeprazole (PRILOSEC) 40 MG capsule Take 40 mg by mouth daily.      . pravastatin (PRAVACHOL) 40 MG tablet Take two tablets daily.      . Rivaroxaban (XARELTO) 15 MG TABS tablet Take 1 tablet (15 mg total) by mouth daily.  30 tablet  3  . torsemide (DEMADEX) 20 MG tablet Take 1 tablet (20 mg total) by mouth 2 (two) times daily as needed.  60 tablet  6  . valACYclovir (VALTREX) 1000 MG tablet Take 1,000 mg by mouth 3 (three) times daily.       . [DISCONTINUED] enalapril (VASOTEC) 20 MG tablet Take 40 mg by mouth daily.       . metoprolol (LOPRESSOR) 50 MG tablet Take 1 tablet (50 mg total) by mouth 2 (two) times daily.  60 tablet  6  . [DISCONTINUED] metoprolol (LOPRESSOR) 50 MG tablet Take 1.5 tablets (75 mg total) by mouth 2 (two) times daily.  90 tablet  6  . [DISCONTINUED] Vitamin D, Ergocalciferol, (DRISDOL) 50000 UNITS CAPS Take 50,000 Units by mouth every 30 (thirty) days.       No facility-administered encounter medications on  file as of 08/24/2012.    Review of Systems  Constitutional: Negative.   HENT: Negative.   Eyes: Negative.   Respiratory: Negative.   Gastrointestinal: Negative.   Musculoskeletal: Negative.   Skin: Negative.   Neurological: Negative.   Psychiatric/Behavioral: Negative.   All other systems reviewed and are negative.    BP 118/72  Pulse 112  Ht 5\' 8"  (1.727 m)  Wt 225 lb 4 oz (102.173 kg)  BMI 34.26 kg/m2  Physical Exam  Nursing note and vitals reviewed. Constitutional: He is oriented to person, place, and time. He appears well-developed and well-nourished.  HENT:  Head: Normocephalic.  Nose:  Nose normal.  Mouth/Throat: Oropharynx is clear and moist.  Eyes: Conjunctivae are normal. Pupils are equal, round, and reactive to light.  Neck: Normal range of motion. Neck supple. No JVD present.  Cardiovascular: S1 normal, S2 normal, normal heart sounds and intact distal pulses.  An irregularly irregular rhythm present. Tachycardia present.  Exam reveals no gallop and no friction rub.   No murmur heard. 1- pitting bilateral LE edema   Pulmonary/Chest: Effort normal and breath sounds normal. No respiratory distress. He has no wheezes. He has no rales. He exhibits no tenderness.  Abdominal: Soft. Bowel sounds are normal. He exhibits no distension. There is no tenderness.  Musculoskeletal: Normal range of motion. He exhibits no edema and no tenderness.  Lymphadenopathy:    He has no cervical adenopathy.  Neurological: He is alert and oriented to person, place, and time. Coordination normal.  Skin: Skin is warm and dry. No rash noted. No erythema.  Psychiatric: He has a normal mood and affect. His behavior is normal. Judgment and thought content normal.      Assessment and Plan

## 2012-08-24 NOTE — Assessment & Plan Note (Signed)
We have encouraged continued exercise, careful diet management in an effort to lose weight. 

## 2012-08-24 NOTE — Assessment & Plan Note (Signed)
Review of his notes of blood pressure recordings over the past several weeks show a labile pressure from Patrick [name] to 0000000 systolic. We have asked him to start the metoprolol. Call the office if blood pressure continues to run high and we would restart low-dose enalapril

## 2012-08-24 NOTE — Patient Instructions (Addendum)
Hold the enalapril Take metoprolol 50 mg twice a day Monitor the blood pressure  If the blood pressure runs high (>150), call the office We would restart enalapril   Please call us if you have new issues that need to be addressed before your next appt.  Your physician wants you to follow-up in: 3 months.  You will receive a reminder letter in the mail two months in advance. If you don't receive a letter, please call our office to schedule the follow-up appointment.

## 2012-08-24 NOTE — Assessment & Plan Note (Signed)
Suggested he  stay on his pravastatin

## 2012-11-09 ENCOUNTER — Telehealth: Payer: Self-pay

## 2012-11-09 NOTE — Telephone Encounter (Signed)
Pt aware that samples will be placed at the front for pick up.

## 2012-11-09 NOTE — Telephone Encounter (Signed)
Pt wife states pt needs some samples of Xarelto, please call.

## 2012-11-25 ENCOUNTER — Ambulatory Visit: Payer: Medicare PPO | Admitting: Cardiovascular Disease

## 2012-11-26 ENCOUNTER — Encounter: Payer: Self-pay | Admitting: Cardiovascular Disease

## 2012-11-26 ENCOUNTER — Ambulatory Visit (INDEPENDENT_AMBULATORY_CARE_PROVIDER_SITE_OTHER): Payer: Medicare PPO | Admitting: Cardiovascular Disease

## 2012-11-26 VITALS — BP 165/73 | HR 95 | Ht 68.0 in | Wt 227.2 lb

## 2012-11-26 DIAGNOSIS — N189 Chronic kidney disease, unspecified: Secondary | ICD-10-CM

## 2012-11-26 DIAGNOSIS — I509 Heart failure, unspecified: Secondary | ICD-10-CM

## 2012-11-26 DIAGNOSIS — I4891 Unspecified atrial fibrillation: Secondary | ICD-10-CM

## 2012-11-26 DIAGNOSIS — E785 Hyperlipidemia, unspecified: Secondary | ICD-10-CM

## 2012-11-26 DIAGNOSIS — E119 Type 2 diabetes mellitus without complications: Secondary | ICD-10-CM

## 2012-11-26 DIAGNOSIS — I10 Essential (primary) hypertension: Secondary | ICD-10-CM

## 2012-11-26 DIAGNOSIS — I5032 Chronic diastolic (congestive) heart failure: Secondary | ICD-10-CM

## 2012-11-26 MED ORDER — ENALAPRIL MALEATE 20 MG PO TABS: 20.0000 mg | ORAL_TABLET | Freq: Every day | ORAL | Status: DC

## 2012-11-26 NOTE — Patient Instructions (Addendum)
You are doing well. Please restart enalapril 20 mg daily  Take extra torsemide for worsening leg swelling  Please call us if you have new issues that need to be addressed before your next appt.  Your physician wants you to follow-up in: 3 months.  You will receive a reminder letter in the mail two months in advance. If you don't receive a letter, please call our office to schedule the follow-up appointment.

## 2012-11-26 NOTE — Assessment & Plan Note (Signed)
Followed by the renal service, Dr. [name]. Wife reports that she was upset after referral to hematology oncology. Reason for this is uncertain. Wife reports he is feeling better on iron pills.

## 2012-11-26 NOTE — Assessment & Plan Note (Signed)
Recommended that he stay on torsemide twice a day. Weight is essentially stable though he does have pitting edema. We'll not push the torsemide given underlying renal dysfunction. If we climbs any further, edema gets worse, would give additional torsemide in the morning.

## 2012-11-26 NOTE — Progress Notes (Signed)
Patient ID: Patrick [name], male    DOB: 1931-06-14, 79 y.o.   MRN: [id number]  HPI Comments: Mr. Osorto is a very pleasant 79 year old gentleman with history of hypertension, diabetes, hyperlipidemia, GERD who presented to the hospital December 30 2011 with weakness, dizziness, found to be in atrial fibrillation with RVR. He was started on rate controlling medications and discharged on Cardizem 300 mg daily with anticoagulation (xarelto). He presents for routine followup   he had worsening lower extremity edema. Diltiazem was held and he was started on higher dose metoprolol. Previous diagnosis of Bell's palsy and fall.  He continues to take torsemide daily. Weight has been relatively stable in the 220 range  not interested in cardioversion on previous office visits He has been tolerating anticoagulation at renal dosing.  complaining about the cost of the medication. He did not qualify for assistance with xarelto. followed by the renal service in [place], Dr. [name] He was told recently that he needed to see hematology/oncology by renal. He and his wife are uncertain why  this is needed. The wife is quite upset and requesting another physician for a second opinion for his renal issues.  Blood pressures have been running high at home. Enalapril was stopped on his previous clinic visit when metoprolol was increased. Her pressures at that time were labile, sometimes low. Since then, blood pressures have been running high, typically [id number] systolic.  Previous CT scan of the head in the hospital showed no intracranial process Lab work October 2013, creatinine 2.2, BUN 34 Lab work August 2013 , Hemoglobin A1c 8.1 Total cholesterol 97, LDL 48  Echocardiogram showed ejection fraction greater than 55%, mildly dilated left atrium, otherwise normal study MRI of the brain showed chronic changes without evidence of acute abnormalities  EKG today shows atrial fibrillation with ventricular  rate 95 minute, nonspecific ST abnormality   Outpatient Encounter Prescriptions as of 11/26/2012  Medication Sig Dispense Refill  . aspirin 81 MG tablet Take 81 mg by mouth daily.      . cyclobenzaprine (FLEXERIL) 5 MG tablet Take 5 mg by mouth as needed for muscle spasms.      . Ferrous Sulfate (IRON) 325 (65 FE) MG TABS Take by mouth. Take one tablet twice a day.      Marland Kitchen glipiZIDE (GLUCOTROL) 10 MG tablet Take 10 mg by mouth 2 (two) times daily before a meal.       . hydrALAZINE (APRESOLINE) 100 MG tablet Take 1 tablet (100 mg total) by mouth 3 (three) times daily.  270 tablet  3  . insulin lispro protamine-insulin lispro (HUMALOG 50/50) (50-50) 100 UNIT/ML SUSP Inject 70 Units into the skin 2 (two) times daily before a meal.       . omeprazole (PRILOSEC) 40 MG capsule Take 40 mg by mouth daily.      . pravastatin (PRAVACHOL) 40 MG tablet Take two tablets daily.      . Rivaroxaban (XARELTO) 15 MG TABS tablet Take 1 tablet (15 mg total) by mouth daily.  30 tablet  3  . torsemide (DEMADEX) 20 MG tablet Take 1 tablet (20 mg total) by mouth 2 (two) times daily as needed.  60 tablet  6    Review of Systems  Constitutional: Negative.   HENT: Negative.   Eyes: Negative.   Respiratory: Negative.   Cardiovascular: Positive for leg swelling.  Gastrointestinal: Negative.   Musculoskeletal: Negative.   Skin: Negative.   Neurological: Negative.   Psychiatric/Behavioral: Negative.   All  other systems reviewed and are negative.    BP 165/73  Pulse 95  Ht 5\' 8"  (1.727 m)  Wt 227 lb 4 oz (103.08 kg)  BMI 34.56 kg/m2  Physical Exam  Nursing note and vitals reviewed. Constitutional: He is oriented to person, place, and time. He appears well-developed and well-nourished.  HENT:  Head: Normocephalic.  Nose: Nose normal.  Mouth/Throat: Oropharynx is clear and moist.  Eyes: Conjunctivae are normal. Pupils are equal, round, and reactive to light.  Neck: Normal range of motion. Neck supple. No  JVD present.  Cardiovascular: Normal rate, S1 normal, S2 normal, normal heart sounds and intact distal pulses.  An irregularly irregular rhythm present. Exam reveals no gallop and no friction rub.   No murmur heard. 1- pitting bilateral LE edema   Pulmonary/Chest: Effort normal and breath sounds normal. No respiratory distress. He has no wheezes. He has no rales. He exhibits no tenderness.  Abdominal: Soft. Bowel sounds are normal. He exhibits no distension. There is no tenderness.  Musculoskeletal: Normal range of motion. He exhibits no edema and no tenderness.  Lymphadenopathy:    He has no cervical adenopathy.  Neurological: He is alert and oriented to person, place, and time. Coordination normal.  Skin: Skin is warm and dry. No rash noted. No erythema.  Psychiatric: He has a normal mood and affect. His behavior is normal. Judgment and thought content normal.      Assessment and Plan

## 2012-11-26 NOTE — Assessment & Plan Note (Signed)
No changes to the medications were made.

## 2012-11-26 NOTE — Assessment & Plan Note (Signed)
Heart rate better controlled on higher dose metoprolol. No changes made to his metoprolol. He will stay on anticoagulation. We will try to help with samples. Denied by assistance program with Xarelto.

## 2012-11-26 NOTE — Assessment & Plan Note (Signed)
Blood pressure running high. We will restart enalapril 20 mg daily. He has followup with renal service in October. Suggested he closely monitor his blood pressure at home and call our office if systolic pressures run more than 140.

## 2012-11-26 NOTE — Assessment & Plan Note (Signed)
We have encouraged continued exercise, careful diet management in an effort to lose weight. 

## 2012-11-29 ENCOUNTER — Inpatient Hospital Stay: Payer: Self-pay | Admitting: Internal Medicine

## 2012-11-29 LAB — CBC
HCT: 37.9 % — ABNORMAL LOW (ref 40.0–52.0)
HGB: 13.1 g/dL (ref 13.0–18.0)
MCH: 30.5 pg (ref 26.0–34.0)
MCHC: 34.5 g/dL (ref 32.0–36.0)
MCV: 88 fL (ref 80–100)
Platelet: 242 10*3/uL (ref 150–440)
RBC: 4.29 10*6/uL — ABNORMAL LOW (ref 4.40–5.90)
RDW: 22.3 % — ABNORMAL HIGH (ref 11.5–14.5)
WBC: 12.2 10*3/uL — ABNORMAL HIGH (ref 3.8–10.6)

## 2012-11-29 LAB — BASIC METABOLIC PANEL
BUN: 16 mg/dL (ref 7–18)
Calcium, Total: 9 mg/dL (ref 8.5–10.1)
Chloride: 105 mmol/L (ref 98–107)
EGFR (African American): 44 — ABNORMAL LOW
EGFR (Non-African Amer.): 38 — ABNORMAL LOW
Potassium: 3.3 mmol/L — ABNORMAL LOW (ref 3.5–5.1)
Sodium: 141 mmol/L (ref 136–145)

## 2012-11-29 LAB — TROPONIN I: Troponin-I: 0.05 ng/mL

## 2012-11-29 LAB — PRO B NATRIURETIC PEPTIDE: B-Type Natriuretic Peptide: 4711 pg/mL — ABNORMAL HIGH (ref 0–450)

## 2012-11-30 DIAGNOSIS — I509 Heart failure, unspecified: Secondary | ICD-10-CM

## 2012-11-30 DIAGNOSIS — I517 Cardiomegaly: Secondary | ICD-10-CM

## 2012-11-30 DIAGNOSIS — I4891 Unspecified atrial fibrillation: Secondary | ICD-10-CM

## 2012-11-30 LAB — URINALYSIS, COMPLETE
Bacteria: NONE SEEN
Bilirubin,UR: NEGATIVE
Leukocyte Esterase: NEGATIVE
Nitrite: NEGATIVE
Protein: 30
RBC,UR: 2 /HPF (ref 0–5)
WBC UR: NONE SEEN /HPF (ref 0–5)

## 2012-11-30 LAB — CBC WITH DIFFERENTIAL/PLATELET
Basophil #: 0 10*3/uL (ref 0.0–0.1)
Basophil %: 0.5 %
Eosinophil #: 0.1 10*3/uL (ref 0.0–0.7)
Eosinophil %: 0.6 %
HCT: 34.4 % — ABNORMAL LOW (ref 40.0–52.0)
HGB: 12 g/dL — ABNORMAL LOW (ref 13.0–18.0)
MCH: 31 pg (ref 26.0–34.0)
MCHC: 35 g/dL (ref 32.0–36.0)
MCV: 89 fL (ref 80–100)
Monocyte #: 0.6 x10 3/mm (ref 0.2–1.0)
Neutrophil #: 7.3 10*3/uL — ABNORMAL HIGH (ref 1.4–6.5)
Neutrophil %: 79.5 %
RBC: 3.88 10*6/uL — ABNORMAL LOW (ref 4.40–5.90)
WBC: 9.2 10*3/uL (ref 3.8–10.6)

## 2012-11-30 LAB — MAGNESIUM: Magnesium: 1.9 mg/dL

## 2012-11-30 LAB — TROPONIN I: Troponin-I: 0.06 ng/mL — ABNORMAL HIGH

## 2012-11-30 LAB — LIPID PANEL
HDL Cholesterol: 24 mg/dL — ABNORMAL LOW (ref 40–60)
Ldl Cholesterol, Calc: 46 mg/dL (ref 0–100)
Triglycerides: 140 mg/dL (ref 0–200)

## 2012-11-30 LAB — TSH: Thyroid Stimulating Horm: 0.531 u[IU]/mL

## 2012-11-30 LAB — HEMOGLOBIN A1C: Hemoglobin A1C: 9 % — ABNORMAL HIGH (ref 4.2–6.3)

## 2012-12-01 ENCOUNTER — Telehealth: Payer: Self-pay

## 2012-12-01 DIAGNOSIS — I10 Essential (primary) hypertension: Secondary | ICD-10-CM

## 2012-12-01 DIAGNOSIS — I4891 Unspecified atrial fibrillation: Secondary | ICD-10-CM

## 2012-12-01 DIAGNOSIS — I509 Heart failure, unspecified: Secondary | ICD-10-CM

## 2012-12-01 LAB — BASIC METABOLIC PANEL
Anion Gap: 7 (ref 7–16)
BUN: 21 mg/dL — ABNORMAL HIGH (ref 7–18)
Chloride: 103 mmol/L (ref 98–107)
Creatinine: 1.85 mg/dL — ABNORMAL HIGH (ref 0.60–1.30)
EGFR (African American): 39 — ABNORMAL LOW
Glucose: 84 mg/dL (ref 65–99)
Osmolality: 283 (ref 275–301)
Potassium: 2.7 mmol/L — ABNORMAL LOW (ref 3.5–5.1)

## 2012-12-01 NOTE — Telephone Encounter (Signed)
Patient contacted regarding discharge from [facility] on 12/01/12.  Patient understands to follow up with provider Valeria Batman, PA on 12/09/12 at 1:00 at [facility] office. Patient understands discharge instructions? yes Patient understands medications and regiment? yes Patient understands to bring all medications to this visit? yes

## 2012-12-03 ENCOUNTER — Ambulatory Visit: Payer: Self-pay | Admitting: Hematology and Oncology

## 2012-12-03 LAB — CBC CANCER CENTER
Basophil #: 0.1 x10 3/mm (ref 0.0–0.1)
Basophil %: 1.4 %
Eosinophil #: 0.2 x10 3/mm (ref 0.0–0.7)
Eosinophil %: 3 %
HCT: 38.7 % — ABNORMAL LOW (ref 40.0–52.0)
HGB: 12.9 g/dL — ABNORMAL LOW (ref 13.0–18.0)
Lymphocyte #: 1.2 x10 3/mm (ref 1.0–3.6)
Lymphocyte %: 16.6 %
MCH: 30.2 pg (ref 26.0–34.0)
MCV: 91 fL (ref 80–100)
Monocyte #: 0.7 x10 3/mm (ref 0.2–1.0)
Neutrophil #: 5.1 x10 3/mm (ref 1.4–6.5)
Neutrophil %: 69.8 %
RBC: 4.26 10*6/uL — ABNORMAL LOW (ref 4.40–5.90)
RDW: 20.7 % — ABNORMAL HIGH (ref 11.5–14.5)
WBC: 7.3 x10 3/mm (ref 3.8–10.6)

## 2012-12-09 ENCOUNTER — Ambulatory Visit (INDEPENDENT_AMBULATORY_CARE_PROVIDER_SITE_OTHER): Payer: Medicare PPO | Admitting: Physician Assistant

## 2012-12-09 ENCOUNTER — Encounter: Payer: Self-pay | Admitting: Physician Assistant

## 2012-12-09 VITALS — BP 120/64 | HR 77 | Ht 65.0 in | Wt 222.5 lb

## 2012-12-09 DIAGNOSIS — I509 Heart failure, unspecified: Secondary | ICD-10-CM

## 2012-12-09 DIAGNOSIS — I4891 Unspecified atrial fibrillation: Secondary | ICD-10-CM

## 2012-12-09 DIAGNOSIS — I10 Essential (primary) hypertension: Secondary | ICD-10-CM

## 2012-12-09 DIAGNOSIS — E119 Type 2 diabetes mellitus without complications: Secondary | ICD-10-CM

## 2012-12-09 DIAGNOSIS — E785 Hyperlipidemia, unspecified: Secondary | ICD-10-CM

## 2012-12-09 DIAGNOSIS — I5032 Chronic diastolic (congestive) heart failure: Secondary | ICD-10-CM

## 2012-12-09 NOTE — Assessment & Plan Note (Addendum)
Well-compensated. Euvolemic weight ~ 220 lbs. Praised on non-pharmacologic CHF management in salt/fluid restriction and compression stockings. He continues to weigh himself daily. For pharmacologic management, continue torsemide 20mg  BID. Fluid develops in his abdomen and suspect this diuretic is a good option. Advised to take an extra tablet PRN for weight gain (> 3 lbs in one day/> 5 lbs in two days), SOB, abdominal/LE edema. Discussed need to control BP. Advised follow-up with PCP for sleep study. He has poor insight into his personal health issue relying on family for management. Discussed importance and accountability for his own health maintenance. Continue home health to assist with this.

## 2012-12-09 NOTE — Assessment & Plan Note (Signed)
ACEi held on recent admission due to CKD. Advised to stop carvedilol. He takes Lopressor for rate-control. He will monitor BP with these changes. Continue hydralazine. If BP elevates on this regimen, consider adding amlodipine.

## 2012-12-09 NOTE — Patient Instructions (Addendum)
Please continue torsemide as prescribed. Take an extra tablet for weight gain above 220 lbs, for shortness of breath and for abdominal or leg swelling. Continue to limit salt intake (less than 2 gram of sodium/day) and fluid intake (less than 2 liters of water/day).   Stop taking carvedilol. Continue to monitor your blood pressure and ensure it is well-controlled (less than 140/90). If your blood pressure stays elevated consistently, call our office for further management.  Continue Xarelto as prescribed.   Please follow-up with Dr. Nilda Simmer in 1-2 weeks for diabetes management.   Please follow-up with Primary care doctor for sleep study  We will see you back in 1 month.

## 2012-12-09 NOTE — Progress Notes (Addendum)
Patient ID: Patrick [name], male   DOB: Feb 10, 1932, 79 y.o.   MRN: [id number]            Date:  12/09/2012   ID:  Patrick [name], DOB Nov 29, 1931, MRN [id number]  PCP:  Maryland Pink, MD  Primary Cardiologist:  Johnny Bridge, MD   History of Present Illness:  Patrick [name] is a 79 y.o. 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   He was seen in consultation for newly diagnosed a-fib with RVR 12/2011. This was rate-controlled on metoprolol and diltiazem. Xarelto 15mg  PO was started for anticoagulation. Echo at that time indicated EF 50-55%, mild LVH, mild LA dilatation. Torsemide was started earlier this year given 14 lbs weight gain and LE edema. Edema persisted and diltiazem was held. Metoprolol was increased for rate-control as he had remained in a-fib. He has declined DCCV previously. He last followed up with Dr. [name] 9/4. Weight had been stable ~ 220 lbs. BP elevated. ACEi was added.   He developed progressive SOB and PND since that time. He admitted to fluid and salt indiscretion. He was admitted, diuresed with IV Lasix. There was a mild troponemia on blood work which was felt to be secondary to CHF. He denied chest pain. He was counseled on salt restriction, discharged on home torsemide and with home health PT/CHF RN services.   2D echo 12/10/12- EF 60-65%, mod LVH, mild biatrial enlargement.  His nephrologist Dr. [name] recommended hem/onc consult for anemia. He followed up w/ hem/onc 9/11. 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 No treatment initiated.   Labwork at [facility]  TSH 0.531  Hgb A1C 9.0%  LDL 46  HDL 24  TG 140  TC 98   He has felt well since discharge. Weight has been stable- 219 lbs today. He has limited salt and  fluid intake. He continues to wear compression stockings.  He continues to take torsemide and Xarelto as prescribed. He is taking two beta blockers- carvedilol and metoprolol tartrate. BP has been stable at home. No lightheadedness, fatigue or syncope.   EKG: atrial fibrillation, 77 bpm, no ST/T changes  Wt Readings from Last 3 Encounters:  12/09/12 222 lb 8 oz (100.925 kg)  11/26/12 227 lb 4 oz (103.08 kg)  08/24/12 225 lb 4 oz (102.173 kg)     Past Medical History  Diagnosis Date  . Hypertension   . Diabetes mellitus without complication   . Hyperlipidemia   . History of gastroesophageal reflux (GERD)   . Arrhythmia   . A-fib   . Bell's palsy     Current Outpatient Prescriptions  Medication Sig Dispense Refill  . aspirin 81 MG tablet Take 81 mg by mouth daily.      . carvedilol (COREG) 3.125 MG tablet Take 3.125 mg by mouth 2 (two) times daily with a meal.      . cyclobenzaprine (FLEXERIL) 5 MG tablet Take 5 mg by mouth as needed for muscle spasms.      . Ferrous Sulfate (IRON) 325 (65 FE) MG TABS Take by mouth. Take one tablet twice a day.      Marland Kitchen glipiZIDE (GLUCOTROL) 10 MG tablet Take 10 mg by mouth 2 (two) times daily before a meal.       . hydrALAZINE (APRESOLINE) 100 MG tablet Take 1  tablet (100 mg total) by mouth 3 (three) times daily.  270 tablet  3  . insulin lispro protamine-insulin lispro (HUMALOG 50/50) (50-50) 100 UNIT/ML SUSP Inject 70 Units into the skin 2 (two) times daily before a meal.       . metoprolol (LOPRESSOR) 50 MG tablet Take 75 mg by mouth 2 (two) times daily.      Marland Kitchen omeprazole (PRILOSEC) 40 MG capsule Take 40 mg by mouth daily.      . potassium chloride (KLOR-CON) 20 MEQ packet Take 20 mEq by mouth daily.      . pravastatin (PRAVACHOL) 40 MG tablet Take two tablets daily.      . Rivaroxaban (XARELTO) 15 MG TABS tablet Take 1 tablet (15 mg total) by mouth daily.  30 tablet  3  . torsemide (DEMADEX) 20 MG tablet Take 1 tablet (20 mg total) by mouth 2  (two) times daily as needed.  60 tablet  6   No current facility-administered medications for this visit.    Allergies:   No Known Allergies  Social History:  The patient  reports that he quit smoking about 42 years ago. His smoking use included Cigars. He does not have any smokeless tobacco history on file. He reports that he does not drink alcohol or use illicit drugs.   Family History:  Family History  Problem Relation Age of Onset  . Heart attack Brother     Review of Systems: General: negative for chills, fever, night sweats or weight changes.  Cardiovascular: negative for chest pain, dyspnea on exertion, edema, orthopnea, palpitations, paroxysmal nocturnal dyspnea or shortness of breath Dermatological:  negative for rash Respiratory:  negative for cough or wheezing Urologic:  negative for hematuria Abdominal:  negative for nausea, vomiting, diarrhea, bright red blood per rectum, melena, or hematemesis Neurologic:  negative for visual changes, syncope, or dizziness All other systems reviewed and are otherwise negative except as noted above.  PHYSICAL EXAM: VS:  Ht 5\' 5"  (1.651 m)  Wt 222 lb 8 oz (100.925 kg)  BMI 37.03 kg/m2 Well nourished, well developed, in no acute distress HEENT: normal, PERRL Neck: no JVD or bruits Cardiac:  Irregularly irregular, normal S1, S2; no murmur or gallops Lungs:  clear to auscultation bilaterally, no wheezing, rhonchi or rales Abd: soft, nontender, no hepatomegaly, normoactive BS x 4 quads Ext: compression stockings applied, no edema, cyanosis or clubbing Skin: warm and dry, cap refill < 2 sec Neuro:  CNs 2-12 intact, no focal abnormalities noted Musculoskeletal: strength and tone appropriate for age  Psych: normal affect

## 2012-12-09 NOTE — Assessment & Plan Note (Signed)
Well-controlled on recent admission. Continue statin.

## 2012-12-09 NOTE — Assessment & Plan Note (Signed)
Rate-controlled on EKG today. Continue Lopressor and Xarelto.

## 2012-12-09 NOTE — Assessment & Plan Note (Addendum)
Hgb A1C 9.0% on recent admission indicating poor glycemic control. Follow-up w/ Dr. Nilda Simmer for further management.

## 2012-12-10 ENCOUNTER — Encounter: Payer: Self-pay | Admitting: *Deleted

## 2012-12-23 ENCOUNTER — Ambulatory Visit: Payer: Self-pay | Admitting: Hematology and Oncology

## 2012-12-23 ENCOUNTER — Ambulatory Visit: Payer: Self-pay | Admitting: Family Medicine

## 2012-12-25 ENCOUNTER — Emergency Department: Payer: Self-pay | Admitting: Emergency Medicine

## 2013-01-11 ENCOUNTER — Encounter: Payer: Self-pay | Admitting: Cardiovascular Disease

## 2013-01-11 ENCOUNTER — Ambulatory Visit (INDEPENDENT_AMBULATORY_CARE_PROVIDER_SITE_OTHER): Payer: Medicare PPO | Admitting: Cardiovascular Disease

## 2013-01-11 VITALS — BP 120/72 | HR 87 | Ht 66.0 in | Wt 219.2 lb

## 2013-01-11 DIAGNOSIS — I509 Heart failure, unspecified: Secondary | ICD-10-CM

## 2013-01-11 DIAGNOSIS — I10 Essential (primary) hypertension: Secondary | ICD-10-CM

## 2013-01-11 DIAGNOSIS — I4891 Unspecified atrial fibrillation: Secondary | ICD-10-CM

## 2013-01-11 DIAGNOSIS — I5032 Chronic diastolic (congestive) heart failure: Secondary | ICD-10-CM

## 2013-01-11 DIAGNOSIS — E119 Type 2 diabetes mellitus without complications: Secondary | ICD-10-CM

## 2013-01-11 DIAGNOSIS — E785 Hyperlipidemia, unspecified: Secondary | ICD-10-CM

## 2013-01-11 DIAGNOSIS — N189 Chronic kidney disease, unspecified: Secondary | ICD-10-CM

## 2013-01-11 NOTE — Assessment & Plan Note (Signed)
There is a recent climb in his BUN up to 40. We have suggested he continue on torsemide twice a day with only once a day dosing on Saturdays, no diuretics on Sundays.

## 2013-01-11 NOTE — Assessment & Plan Note (Signed)
Heart rate well-controlled. He is on anticoagulation. He's not interested in cardioversion.

## 2013-01-11 NOTE — Progress Notes (Signed)
Patient ID: Patrick [name], male    DOB: 07-Sep-1931, 79 y.o.   MRN: [id number]  HPI Comments: Patrick [name] is a very pleasant 79 year old gentleman with history of hypertension, diabetes, hyperlipidemia, GERD who presented to the hospital December 30 2011 with weakness, dizziness, found to be in atrial fibrillation with RVR. He was started on rate controlling medications and discharged on Cardizem 300 mg daily with anticoagulation (xarelto). He has significant lower extremity edema with Cardizem and this was weaned off within increase in his beta blocker . He presents for routine followup Previous diagnosis of Bell's palsy   Overall he reports he is doing well. Weight has been relatively stable. 102 weeks ago, he did dip down at least 10 pounds, now back up several pounds. He is taking torsemide twice a day. He denies any significant lower extremity edema. Tolerating anticoagulation with no bleeding. He reports his blood pressure and heart rate have been well controlled at home.   He is not interested in cardioversion on previous office visits He has been tolerating anticoagulation at renal dosing.  CT scan of the head in the hospital showed no intracranial process Lab work October 2013, creatinine 2.2, BUN 34 Lab work August 2013 , Hemoglobin A1c 8.1 Total cholesterol 97, LDL 48  recent creatinine 2.2, BUN in the 40s which is higher than his baseline BUN in the 20s. Wife reports he does not drink very much  Echocardiogram showed ejection fraction greater than 55%, mildly dilated left atrium, otherwise normal study MRI of the brain showed chronic changes without evidence of acute abnormalities  EKG today shows atrial fibrillation with ventricular rate 87 minute, nonspecific ST abnormality   Outpatient Encounter Prescriptions as of 01/11/2013  Medication Sig Dispense Refill  . aspirin 81 MG tablet Take 81 mg by mouth daily.      . cyclobenzaprine (FLEXERIL) 5 MG tablet Take 5 mg by  mouth as needed for muscle spasms.      . Ferrous Sulfate (IRON) 325 (65 FE) MG TABS Take by mouth. Take one tablet twice a day.      Marland Kitchen glipiZIDE (GLUCOTROL) 10 MG tablet Take 10 mg by mouth 2 (two) times daily before a meal.       . hydrALAZINE (APRESOLINE) 100 MG tablet Take 1 tablet (100 mg total) by mouth 3 (three) times daily.  270 tablet  3  . insulin lispro protamine-insulin lispro (HUMALOG 50/50) (50-50) 100 UNIT/ML SUSP Inject 70 Units into the skin 2 (two) times daily before a meal.       . metoprolol (LOPRESSOR) 50 MG tablet Take 75 mg by mouth 2 (two) times daily.      Marland Kitchen omeprazole (PRILOSEC) 40 MG capsule Take 40 mg by mouth daily.      . pravastatin (PRAVACHOL) 40 MG tablet Take two tablets daily.      . Rivaroxaban (XARELTO) 15 MG TABS tablet Take 1 tablet (15 mg total) by mouth daily.  30 tablet  3  . torsemide (DEMADEX) 20 MG tablet Take 1 tablet (20 mg total) by mouth 2 (two) times daily as needed.  60 tablet  6  . [DISCONTINUED] carvedilol (COREG) 3.125 MG tablet Take 3.125 mg by mouth 2 (two) times daily with a meal.      . [DISCONTINUED] potassium chloride (KLOR-CON) 20 MEQ packet Take 20 mEq by mouth daily.       No facility-administered encounter medications on file as of 01/11/2013.    Review of Systems  Constitutional:  Negative.   HENT: Negative.   Eyes: Negative.   Respiratory: Negative.   Gastrointestinal: Negative.   Musculoskeletal: Negative.   Skin: Negative.   Neurological: Negative.   Psychiatric/Behavioral: Negative.   All other systems reviewed and are negative.    BP 120/72  Pulse 87  Ht 5\' 6"  (1.676 m)  Wt 219 lb 4 oz (99.451 kg)  BMI 35.4 kg/m2  Physical Exam  Nursing note and vitals reviewed. Constitutional: He is oriented to person, place, and time. He appears well-developed and well-nourished.  HENT:  Head: Normocephalic.  Nose: Nose normal.  Mouth/Throat: Oropharynx is clear and moist.  Eyes: Conjunctivae are normal. Pupils are  equal, round, and reactive to light.  Neck: Normal range of motion. Neck supple. No JVD present.  Cardiovascular: Normal rate, S1 normal, S2 normal, normal heart sounds and intact distal pulses.  An irregularly irregular rhythm present. Exam reveals no gallop and no friction rub.   No murmur heard. 1- pitting bilateral LE edema   Pulmonary/Chest: Effort normal and breath sounds normal. No respiratory distress. He has no wheezes. He has no rales. He exhibits no tenderness.  Abdominal: Soft. Bowel sounds are normal. He exhibits no distension. There is no tenderness.  Musculoskeletal: Normal range of motion. He exhibits no edema and no tenderness.  Lymphadenopathy:    He has no cervical adenopathy.  Neurological: He is alert and oriented to person, place, and time. Coordination normal.  Skin: Skin is warm and dry. No rash noted. No erythema.  Psychiatric: He has a normal mood and affect. His behavior is normal. Judgment and thought content normal.      Assessment and Plan

## 2013-01-11 NOTE — Assessment & Plan Note (Signed)
Encouraged him to stay on his pravastatin 

## 2013-01-11 NOTE — Assessment & Plan Note (Signed)
We have encouraged continued exercise, careful diet management in an effort to lose weight. 

## 2013-01-11 NOTE — Assessment & Plan Note (Signed)
Followed by Dr. [name]. Recent climb in his BUN. Creatinine stable 2.2 Suggested he back down slightly on his diuretics

## 2013-01-11 NOTE — Assessment & Plan Note (Signed)
Blood pressure is well controlled on today's visit. No changes made to the medications. 

## 2013-01-11 NOTE — Patient Instructions (Signed)
Lab work looks like kidneys are getting dry Please take only on torsemide on Saturday, none on Sunday Take the two on Monday through Friday  Please call us if you have new issues that need to be addressed before your next appt.  Your physician wants you to follow-up in: 6 months.  You will receive a reminder letter in the mail two months in advance. If you don't receive a letter, please call our office to schedule the follow-up appointment.

## 2013-02-16 ENCOUNTER — Other Ambulatory Visit: Payer: Self-pay | Admitting: Cardiovascular Disease

## 2013-02-16 ENCOUNTER — Other Ambulatory Visit: Payer: Self-pay | Admitting: *Deleted

## 2013-02-16 MED ORDER — TORSEMIDE 20 MG PO TABS: ORAL_TABLET | ORAL | Status: DC

## 2013-02-16 NOTE — Telephone Encounter (Signed)
Requested Prescriptions   Signed Prescriptions Disp Refills  . torsemide (DEMADEX) 20 MG tablet 60 tablet 6    Sig: TAKE ONE TABLET BY MOUTH TWICE DAILY AS NEEDED    Authorizing Provider: Minna Merritts    Ordering User: Britt Bottom

## 2013-02-25 ENCOUNTER — Ambulatory Visit: Payer: Medicare PPO | Admitting: Cardiovascular Disease

## 2013-04-14 ENCOUNTER — Telehealth: Payer: Self-pay

## 2013-04-14 NOTE — Telephone Encounter (Signed)
Patrick [name] called requesting samples of Xarelto 15 mg. Left samples at front desk for her daughter to pick up tomorrow.

## 2013-04-14 NOTE — Telephone Encounter (Signed)
Pt wife called and has a question regarding pt Xarelto

## 2013-05-10 ENCOUNTER — Encounter: Payer: Self-pay | Admitting: Cardiovascular Disease

## 2013-05-10 ENCOUNTER — Ambulatory Visit (INDEPENDENT_AMBULATORY_CARE_PROVIDER_SITE_OTHER): Payer: Medicare HMO | Admitting: Cardiovascular Disease

## 2013-05-10 VITALS — BP 208/90 | HR 73 | Ht 68.0 in | Wt 230.2 lb

## 2013-05-10 DIAGNOSIS — E785 Hyperlipidemia, unspecified: Secondary | ICD-10-CM

## 2013-05-10 DIAGNOSIS — R0602 Shortness of breath: Secondary | ICD-10-CM

## 2013-05-10 DIAGNOSIS — I4891 Unspecified atrial fibrillation: Secondary | ICD-10-CM

## 2013-05-10 DIAGNOSIS — E119 Type 2 diabetes mellitus without complications: Secondary | ICD-10-CM

## 2013-05-10 DIAGNOSIS — I10 Essential (primary) hypertension: Secondary | ICD-10-CM

## 2013-05-10 DIAGNOSIS — I5032 Chronic diastolic (congestive) heart failure: Secondary | ICD-10-CM

## 2013-05-10 DIAGNOSIS — I509 Heart failure, unspecified: Secondary | ICD-10-CM

## 2013-05-10 MED ORDER — CLONIDINE HCL 0.1 MG PO TABS: 0.1000 mg | ORAL_TABLET | Freq: Two times a day (BID) | ORAL | Status: DC

## 2013-05-10 NOTE — Assessment & Plan Note (Signed)
Improved on recheck. He did not take his morning hydralazine. We have recommended he start clonidine 0.1 mg twice a day

## 2013-05-10 NOTE — Assessment & Plan Note (Signed)
No changes to the medications were made.

## 2013-05-10 NOTE — Progress Notes (Signed)
Patient ID: Patrick [name], male    DOB: 30-Dec-1931, 80 y.o.   MRN: [id number]  HPI Comments: Patrick [name] is a very pleasant 80 year old gentleman with history of hypertension, diabetes, hyperlipidemia, GERD who presented to the hospital December 30 2011 with weakness, dizziness, found to be in atrial fibrillation with RVR. He was started on rate controlling medications and discharged on Cardizem 300 mg daily with anticoagulation (xarelto). He has significant lower extremity edema with Cardizem and this was weaned off within increase in his beta blocker . He presents for routine followup Previous diagnosis of Bell's palsy   In followup today, he reports that he has had periods of shortness of breath. Wife reports that last night he was short of breath. He may have been missing some of his blood pressure medications. Blood pressure today in the office is [id number] systolic. 160 on repeat by myself. He did not take his hydralazine this morning. She has been giving him diuretic twice a day. No significant leg edema, no significant abdominal swelling or PND or orthopnea. Otherwise he feels well. He does report having sadness as previously lost his grandson this month several years ago.  Previously not interested in cardioversion  He has been tolerating anticoagulation at renal dosing.  CT scan of the head in the hospital showed no intracranial process Lab work October 2013, creatinine 2.2, BUN 34 Lab work August 2013 , Hemoglobin A1c 8.1 Total cholesterol 97, LDL 48 creatinine 2.2, BUN in the 40s which is higher than his baseline BUN in the 20s. Wife reports he does not drink very much  Echocardiogram showed ejection fraction greater than 55%, mildly dilated left atrium, otherwise normal study MRI of the brain showed chronic changes without evidence of acute abnormalities  EKG today shows atrial fibrillation with ventricular rate 73 minute, nonspecific ST abnormality   Outpatient Encounter  Prescriptions as of 05/10/2013  Medication Sig  . aspirin 81 MG tablet Take 81 mg by mouth daily.  . cyclobenzaprine (FLEXERIL) 5 MG tablet Take 5 mg by mouth as needed for muscle spasms.  . Ferrous Sulfate (IRON) 325 (65 FE) MG TABS Take by mouth. Take one tablet twice a day.  Marland Kitchen glipiZIDE (GLUCOTROL) 10 MG tablet Take 10 mg by mouth 2 (two) times daily before a meal.   . hydrALAZINE (APRESOLINE) 100 MG tablet Take 1 tablet (100 mg total) by mouth 3 (three) times daily.  . Insulin Lispro Prot & Lispro (HUMALOG MIX 75/25 ) Inject 70 Units into the skin 2 (two) times daily.  . metoprolol (LOPRESSOR) 50 MG tablet Take 75 mg by mouth 2 (two) times daily.  Marland Kitchen omeprazole (PRILOSEC) 40 MG capsule Take 40 mg by mouth daily.  . pravastatin (PRAVACHOL) 40 MG tablet Take two tablets daily.  . Rivaroxaban (XARELTO) 15 MG TABS tablet Take 1 tablet (15 mg total) by mouth daily.  Marland Kitchen torsemide (DEMADEX) 20 MG tablet TAKE ONE TABLET BY MOUTH TWICE DAILY AS NEEDED  . torsemide (DEMADEX) 20 MG tablet TAKE ONE TABLET BY MOUTH TWICE DAILY AS NEEDED  . [DISCONTINUED] insulin lispro protamine-insulin lispro (HUMALOG 50/50) (50-50) 100 UNIT/ML SUSP Inject 70 Units into the skin 2 (two) times daily before a meal.     Review of Systems  Constitutional: Negative.   HENT: Negative.   Eyes: Negative.   Respiratory: Positive for shortness of breath.   Cardiovascular: Negative.   Gastrointestinal: Negative.   Endocrine: Negative.   Musculoskeletal: Negative.   Skin: Negative.   Allergic/Immunologic: Negative.  Neurological: Negative.   Hematological: Negative.   Psychiatric/Behavioral: Negative.        Tearful prior to exam  All other systems reviewed and are negative.    BP 208/90  Pulse 73  Ht 5\' 8"  (1.727 m)  Wt 230 lb 4 oz (104.441 kg)  BMI 35.02 kg/m2  SpO2 94% Repeat blood pressure 160/100 Physical Exam  Nursing note and vitals reviewed. Constitutional: He is oriented to person, place, and time.  He appears well-developed and well-nourished.  Obese  HENT:  Head: Normocephalic.  Nose: Nose normal.  Mouth/Throat: Oropharynx is clear and moist.  Eyes: Conjunctivae are normal. Pupils are equal, round, and reactive to light.  Neck: Normal range of motion. Neck supple. No JVD present.  Cardiovascular: Normal rate, S1 normal, S2 normal, normal heart sounds and intact distal pulses.  An irregularly irregular rhythm present. Exam reveals no gallop and no friction rub.   No murmur heard. Trace pitting bilateral LE edema   Pulmonary/Chest: Effort normal and breath sounds normal. No respiratory distress. He has no wheezes. He has no rales. He exhibits no tenderness.  Abdominal: Soft. Bowel sounds are normal. He exhibits no distension. There is no tenderness.  Musculoskeletal: Normal range of motion. He exhibits edema. He exhibits no tenderness.  Lymphadenopathy:    He has no cervical adenopathy.  Neurological: He is alert and oriented to person, place, and time. Coordination normal.  Skin: Skin is warm and dry. No rash noted. No erythema.  Psychiatric: He has a normal mood and affect. His behavior is normal. Judgment and thought content normal.      Assessment and Plan

## 2013-05-10 NOTE — Assessment & Plan Note (Signed)
We have encouraged continued exercise, careful diet management in an effort to lose weight. 

## 2013-05-10 NOTE — Assessment & Plan Note (Signed)
Recommended that he stay on torsemide twice a day. He continues to have trace lower extremity edema

## 2013-05-10 NOTE — Patient Instructions (Signed)
You are doing well. Please start clonidine one pill twice a day Monitor the blood pressure, Call the office if it runs high  Please call us if you have new issues that need to be addressed before your next appt.  Your physician wants you to follow-up in: 3 months.  You will receive a reminder letter in the mail two months in advance. If you don't receive a letter, please call our office to schedule the follow-up appointment.

## 2013-05-10 NOTE — Assessment & Plan Note (Signed)
Heart rate relatively well controlled. Recommended he stay on his anticoagulation

## 2013-05-13 ENCOUNTER — Encounter: Payer: Self-pay | Admitting: *Deleted

## 2013-07-01 ENCOUNTER — Telehealth: Payer: Self-pay

## 2013-07-01 NOTE — Telephone Encounter (Signed)
Pt wife called has a question regarding Xarelto

## 2013-07-01 NOTE — Telephone Encounter (Signed)
Spoke w/ pt's wife.  She is inquiring if we have any samples of xarelto that we can provide for pt.  Advised her that I am leaving samples of xarelto 15 mg at the front desk to pick up at her convenience.

## 2013-07-16 ENCOUNTER — Other Ambulatory Visit: Payer: Self-pay | Admitting: Cardiovascular Disease

## 2013-07-19 ENCOUNTER — Other Ambulatory Visit: Payer: Self-pay

## 2013-07-19 ENCOUNTER — Other Ambulatory Visit: Payer: Self-pay | Admitting: Cardiovascular Disease

## 2013-07-19 MED ORDER — HYDRALAZINE HCL 100 MG PO TABS: ORAL_TABLET | ORAL | Status: DC

## 2013-07-21 ENCOUNTER — Other Ambulatory Visit: Payer: Self-pay | Admitting: Cardiovascular Disease

## 2013-07-25 ENCOUNTER — Other Ambulatory Visit: Payer: Self-pay | Admitting: Cardiovascular Disease

## 2013-08-10 ENCOUNTER — Encounter: Payer: Self-pay | Admitting: Cardiovascular Disease

## 2013-08-10 ENCOUNTER — Ambulatory Visit (INDEPENDENT_AMBULATORY_CARE_PROVIDER_SITE_OTHER): Payer: Medicare HMO | Admitting: Cardiovascular Disease

## 2013-08-10 VITALS — BP 130/70 | HR 68 | Ht 68.0 in | Wt 220.5 lb

## 2013-08-10 DIAGNOSIS — E119 Type 2 diabetes mellitus without complications: Secondary | ICD-10-CM

## 2013-08-10 DIAGNOSIS — I4891 Unspecified atrial fibrillation: Secondary | ICD-10-CM

## 2013-08-10 DIAGNOSIS — E785 Hyperlipidemia, unspecified: Secondary | ICD-10-CM

## 2013-08-10 DIAGNOSIS — I5032 Chronic diastolic (congestive) heart failure: Secondary | ICD-10-CM

## 2013-08-10 DIAGNOSIS — I10 Essential (primary) hypertension: Secondary | ICD-10-CM

## 2013-08-10 DIAGNOSIS — I509 Heart failure, unspecified: Secondary | ICD-10-CM

## 2013-08-10 NOTE — Patient Instructions (Signed)
You are doing well. No medication changes were made.  Please call us if you have new issues that need to be addressed before your next appt.  Your physician wants you to follow-up in: 6 months.  You will receive a reminder letter in the mail two months in advance. If you don't receive a letter, please call our office to schedule the follow-up appointment.   

## 2013-08-10 NOTE — Assessment & Plan Note (Signed)
Recommended that he continue on his pravastatin. Most recent lipid panel not available

## 2013-08-10 NOTE — Assessment & Plan Note (Signed)
We have encouraged continued exercise, careful diet management in an effort to lose weight. 

## 2013-08-10 NOTE — Progress Notes (Signed)
Patient ID: Patrick [name], male    DOB: [birth year], 80 y.o.   MRN: [id number]  HPI Comments: Mr. Turlington is a very pleasant 80 year old gentleman with history of hypertension, diabetes, hyperlipidemia, GERD who presented to the hospital December 30 2011 with weakness, dizziness, found to be in atrial fibrillation with RVR. He was started on rate controlling medications and discharged on Cardizem 300 mg daily with anticoagulation (xarelto). He has significant lower extremity edema with Cardizem and this was weaned off within increase in his beta blocker . He presents for routine followup Previous diagnosis of Bell's palsy   His last clinic visit, he reported having significant shortness of breath at rest and with exertion. He was started on clonidine 0.1 mg twice a day for his blood pressure. In followup today, he reports that he feels well with no complaints. He denies having significant shortness of breath with exertion. Blood pressure is typically well controlled on his current medications. He denies having any lower extremity edema.   diuretic twice a day. No significant leg edema, no significant  PND or orthopnea.  Previously not interested in cardioversion  He has been tolerating anticoagulation at renal dosing.  CT scan of the head in the hospital showed no intracranial process Lab work October 2013, creatinine 2.2, BUN 34 Lab work August 2013 , Hemoglobin A1c 8.1 Total cholesterol 97, LDL 48 creatinine 2.2, BUN in the 40s which is higher than his baseline BUN in the 20s. Wife reports he does not drink very much  Echocardiogram showed ejection fraction greater than 55%, mildly dilated left atrium, otherwise normal study MRI of the brain showed chronic changes without evidence of acute abnormalities  EKG today shows atrial fibrillation with ventricular rate 68 minute, nonspecific ST abnormality   Outpatient Encounter Prescriptions as of 08/10/2013  Medication Sig  . aspirin  81 MG tablet Take 81 mg by mouth daily.  . cloNIDine (CATAPRES) 0.1 MG tablet Take 1 tablet (0.1 mg total) by mouth 2 (two) times daily.  . enalapril (VASOTEC) 5 MG tablet Take 5 mg by mouth daily.   . Ferrous Sulfate (IRON) 325 (65 FE) MG TABS Take by mouth. Take one tablet twice a day.  Marland Kitchen glipiZIDE (GLUCOTROL) 10 MG tablet Take 10 mg by mouth 2 (two) times daily before a meal.   . hydrALAZINE (APRESOLINE) 100 MG tablet TAKE ONE TABLET BY MOUTH THREE TIMES DAILY  . Insulin Lispro Prot & Lispro (HUMALOG MIX 75/25 [place]) Inject 70 Units into the skin 2 (two) times daily.  . metoprolol (LOPRESSOR) 50 MG tablet TAKE ONE & ONE-HALF TABLETS (75MG ) BY MOUTH TWICE DAILY  . omeprazole (PRILOSEC) 40 MG capsule Take 40 mg by mouth daily.  . pravastatin (PRAVACHOL) 40 MG tablet Take two tablets daily.  . Rivaroxaban (XARELTO) 15 MG TABS tablet Take 1 tablet (15 mg total) by mouth daily.  Marland Kitchen torsemide (DEMADEX) 20 MG tablet TAKE ONE TABLET BY MOUTH TWICE DAILY AS NEEDED  . Vitamin D, Ergocalciferol, (DRISDOL) 50000 UNITS CAPS capsule Take 50,000 Units by mouth every 7 (seven) days.   . [DISCONTINUED] cyclobenzaprine (FLEXERIL) 5 MG tablet Take 5 mg by mouth as needed for muscle spasms.  . [DISCONTINUED] hydrALAZINE (APRESOLINE) 100 MG tablet TAKE ONE TABLET BY MOUTH THREE TIMES DAILY  . [DISCONTINUED] metoprolol (LOPRESSOR) 50 MG tablet Take 75 mg by mouth 2 (two) times daily.  . [DISCONTINUED] metoprolol (LOPRESSOR) 50 MG tablet TAKE ONE & ONE-HALF TABLETS (75MG ) BY MOUTH TWICE DAILY  Review of Systems  Constitutional: Negative.   HENT: Negative.   Eyes: Negative.   Cardiovascular: Negative.   Gastrointestinal: Negative.   Endocrine: Negative.   Musculoskeletal: Negative.   Skin: Negative.   Allergic/Immunologic: Negative.   Neurological: Negative.   Hematological: Negative.   Psychiatric/Behavioral: Negative.   All other systems reviewed and are negative.   BP 130/70  Pulse 68  Ht 5\' 8"   (1.727 m)  Wt 220 lb 8 oz (100.018 kg)  BMI 33.53 kg/m2  Physical Exam  Nursing note and vitals reviewed. Constitutional: He is oriented to person, place, and time. He appears well-developed and well-nourished.  Obese  HENT:  Head: Normocephalic.  Nose: Nose normal.  Mouth/Throat: Oropharynx is clear and moist.  Eyes: Conjunctivae are normal. Pupils are equal, round, and reactive to light.  Neck: Normal range of motion. Neck supple. No JVD present.  Cardiovascular: Normal rate, S1 normal, S2 normal, normal heart sounds and intact distal pulses.  An irregularly irregular rhythm present. Exam reveals no gallop and no friction rub.   No murmur heard. Trace pitting bilateral LE edema   Pulmonary/Chest: Effort normal and breath sounds normal. No respiratory distress. He has no wheezes. He has no rales. He exhibits no tenderness.  Abdominal: Soft. Bowel sounds are normal. He exhibits no distension. There is no tenderness.  Musculoskeletal: Normal range of motion. He exhibits edema. He exhibits no tenderness.  Lymphadenopathy:    He has no cervical adenopathy.  Neurological: He is alert and oriented to person, place, and time. Coordination normal.  Skin: Skin is warm and dry. No rash noted. No erythema.  Psychiatric: He has a normal mood and affect. His behavior is normal. Judgment and thought content normal.      Assessment and Plan

## 2013-08-10 NOTE — Assessment & Plan Note (Signed)
Recommended that he continue on his diuretics. He appears relatively euvolemic

## 2013-08-10 NOTE — Assessment & Plan Note (Signed)
Blood pressure is well controlled on today's visit. No changes made to the medications. 

## 2013-08-10 NOTE — Assessment & Plan Note (Signed)
Heart rate is relatively well-controlled. Tolerating anticoagulation

## 2013-09-22 NOTE — Telephone Encounter (Signed)
This encounter was created in error - please disregard.

## 2013-10-21 ENCOUNTER — Telehealth: Payer: Self-pay

## 2013-10-21 NOTE — Telephone Encounter (Signed)
Would make sure his sleep is adequate as lack of sleep can contribute to poor energy  Could try  General multi vitamin to start with Suggest he have primary care check his testosterone and thyroid level

## 2013-10-21 NOTE — Telephone Encounter (Signed)
Pt thinks he needs some vitamins, due to lack of energy, and is not sure what to get, he gets tired easily. Please call and advise.

## 2013-10-22 NOTE — Telephone Encounter (Signed)
Left detailed message on pt's vm w/ Dr. Donivan Scull recommendation.  Asked pt to call w/ any questions or concerns.

## 2013-11-10 ENCOUNTER — Other Ambulatory Visit: Payer: Self-pay | Admitting: *Deleted

## 2013-11-10 NOTE — Telephone Encounter (Signed)
Error

## 2013-12-23 ENCOUNTER — Ambulatory Visit: Payer: Self-pay | Admitting: Oncology

## 2014-01-02 ENCOUNTER — Inpatient Hospital Stay: Payer: Self-pay | Admitting: Internal Medicine

## 2014-01-02 LAB — COMPREHENSIVE METABOLIC PANEL
ALBUMIN: 3.1 g/dL — AB (ref 3.4–5.0)
ALK PHOS: 92 U/L
ALT: 17 U/L
Anion Gap: 3 — ABNORMAL LOW (ref 7–16)
BILIRUBIN TOTAL: 0.7 mg/dL (ref 0.2–1.0)
BUN: 18 mg/dL (ref 7–18)
CALCIUM: 8.5 mg/dL (ref 8.5–10.1)
CO2: 30 mmol/L (ref 21–32)
Chloride: 108 mmol/L — ABNORMAL HIGH (ref 98–107)
Creatinine: 2 mg/dL — ABNORMAL HIGH (ref 0.60–1.30)
EGFR (African American): 42 — ABNORMAL LOW
EGFR (Non-African Amer.): 34 — ABNORMAL LOW
GLUCOSE: 179 mg/dL — AB (ref 65–99)
Osmolality: 288 (ref 275–301)
Potassium: 3.7 mmol/L (ref 3.5–5.1)
SGOT(AST): 18 U/L (ref 15–37)
Sodium: 141 mmol/L (ref 136–145)
Total Protein: 7.3 g/dL (ref 6.4–8.2)

## 2014-01-02 LAB — CBC
HCT: 26.9 % — AB (ref 40.0–52.0)
HGB: 8.1 g/dL — AB (ref 13.0–18.0)
MCH: 24.6 pg — AB (ref 26.0–34.0)
MCHC: 30 g/dL — ABNORMAL LOW (ref 32.0–36.0)
MCV: 82 fL (ref 80–100)
PLATELETS: 309 10*3/uL (ref 150–440)
RBC: 3.29 10*6/uL — ABNORMAL LOW (ref 4.40–5.90)
RDW: 17.9 % — ABNORMAL HIGH (ref 11.5–14.5)
WBC: 6.6 10*3/uL (ref 3.8–10.6)

## 2014-01-02 LAB — CK TOTAL AND CKMB (NOT AT ARMC)
CK, TOTAL: 74 U/L
CK, Total: 84 U/L
CK-MB: 1.8 ng/mL (ref 0.5–3.6)
CK-MB: 1.6 ng/mL (ref 0.5–3.6)

## 2014-01-02 LAB — TROPONIN I
TROPONIN-I: 0.03 ng/mL
TROPONIN-I: 0.02 ng/mL
Troponin-I: 0.02 ng/mL

## 2014-01-02 LAB — PRO B NATRIURETIC PEPTIDE: B-Type Natriuretic Peptide: 2708 pg/mL — ABNORMAL HIGH (ref 0–450)

## 2014-01-03 ENCOUNTER — Encounter: Payer: Self-pay | Admitting: Nurse Practitioner

## 2014-01-03 ENCOUNTER — Other Ambulatory Visit: Payer: Self-pay | Admitting: Nurse Practitioner

## 2014-01-03 DIAGNOSIS — I5033 Acute on chronic diastolic (congestive) heart failure: Secondary | ICD-10-CM

## 2014-01-03 DIAGNOSIS — D649 Anemia, unspecified: Secondary | ICD-10-CM

## 2014-01-03 DIAGNOSIS — I341 Nonrheumatic mitral (valve) prolapse: Secondary | ICD-10-CM

## 2014-01-03 DIAGNOSIS — I10 Essential (primary) hypertension: Secondary | ICD-10-CM

## 2014-01-03 DIAGNOSIS — N183 Chronic kidney disease, stage 3 (moderate): Secondary | ICD-10-CM

## 2014-01-03 LAB — CBC WITH DIFFERENTIAL/PLATELET
BASOS ABS: 0.1 10*3/uL (ref 0.0–0.1)
BASOS PCT: 1 %
Eosinophil #: 0.2 10*3/uL (ref 0.0–0.7)
Eosinophil %: 2.9 %
HCT: 24.3 % — AB (ref 40.0–52.0)
HGB: 7.6 g/dL — ABNORMAL LOW (ref 13.0–18.0)
Lymphocyte #: 1 10*3/uL (ref 1.0–3.6)
Lymphocyte %: 15.7 %
MCH: 25 pg — ABNORMAL LOW (ref 26.0–34.0)
MCHC: 31.2 g/dL — ABNORMAL LOW (ref 32.0–36.0)
MCV: 80 fL (ref 80–100)
Monocyte #: 0.4 x10 3/mm (ref 0.2–1.0)
Monocyte %: 6.5 %
NEUTROS ABS: 4.5 10*3/uL (ref 1.4–6.5)
NEUTROS PCT: 73.9 %
Platelet: 283 10*3/uL (ref 150–440)
RBC: 3.03 10*6/uL — AB (ref 4.40–5.90)
RDW: 17.8 % — ABNORMAL HIGH (ref 11.5–14.5)
WBC: 6.1 10*3/uL (ref 3.8–10.6)

## 2014-01-03 LAB — COMPREHENSIVE METABOLIC PANEL
Albumin: 2.6 g/dL — ABNORMAL LOW (ref 3.4–5.0)
Alkaline Phosphatase: 75 U/L
Anion Gap: 7 (ref 7–16)
BUN: 16 mg/dL (ref 7–18)
Bilirubin,Total: 0.7 mg/dL (ref 0.2–1.0)
CHLORIDE: 108 mmol/L — AB (ref 98–107)
CO2: 29 mmol/L (ref 21–32)
Calcium, Total: 8.1 mg/dL — ABNORMAL LOW (ref 8.5–10.1)
Creatinine: 2.02 mg/dL — ABNORMAL HIGH (ref 0.60–1.30)
GFR CALC AF AMER: 41 — AB
GFR CALC NON AF AMER: 34 — AB
GLUCOSE: 106 mg/dL — AB (ref 65–99)
OSMOLALITY: 288 (ref 275–301)
POTASSIUM: 3.5 mmol/L (ref 3.5–5.1)
SGOT(AST): 13 U/L — ABNORMAL LOW (ref 15–37)
SGPT (ALT): 12 U/L — ABNORMAL LOW
Sodium: 144 mmol/L (ref 136–145)
Total Protein: 6.4 g/dL (ref 6.4–8.2)

## 2014-01-03 LAB — PRO B NATRIURETIC PEPTIDE: B-Type Natriuretic Peptide: 2853 pg/mL — ABNORMAL HIGH (ref 0–450)

## 2014-01-03 LAB — FERRITIN: Ferritin (ARMC): 48 ng/mL (ref 8–388)

## 2014-01-03 LAB — LIPID PANEL
Cholesterol: 81 mg/dL (ref 0–200)
HDL: 16 mg/dL — AB (ref 40–60)
LDL CHOLESTEROL, CALC: 43 mg/dL (ref 0–100)
Triglycerides: 111 mg/dL (ref 0–200)
VLDL Cholesterol, Calc: 22 mg/dL (ref 5–40)

## 2014-01-03 LAB — IRON AND TIBC
IRON SATURATION: 10 %
IRON: 33 ug/dL — AB (ref 65–175)
Iron Bind.Cap.(Total): 339 ug/dL (ref 250–450)
Unbound Iron-Bind.Cap.: 306 ug/dL

## 2014-01-03 LAB — LACTATE DEHYDROGENASE: LDH: 154 U/L (ref 85–241)

## 2014-01-03 LAB — HEMOGLOBIN A1C: HEMOGLOBIN A1C: 6.3 % (ref 4.2–6.3)

## 2014-01-03 LAB — FOLATE: Folic Acid: 5.4 ng/mL (ref 3.1–100.0)

## 2014-01-03 LAB — TSH: Thyroid Stimulating Horm: 0.851 u[IU]/mL

## 2014-01-04 LAB — CBC WITH DIFFERENTIAL/PLATELET
BASOS ABS: 0.1 10*3/uL (ref 0.0–0.1)
Basophil %: 1.1 %
Eosinophil #: 0.2 10*3/uL (ref 0.0–0.7)
Eosinophil %: 2.9 %
HCT: 24.4 % — AB (ref 40.0–52.0)
HGB: 7.7 g/dL — ABNORMAL LOW (ref 13.0–18.0)
Lymphocyte #: 0.8 10*3/uL — ABNORMAL LOW (ref 1.0–3.6)
Lymphocyte %: 11.6 %
MCH: 25.3 pg — AB (ref 26.0–34.0)
MCHC: 31.7 g/dL — ABNORMAL LOW (ref 32.0–36.0)
MCV: 80 fL (ref 80–100)
MONO ABS: 0.5 x10 3/mm (ref 0.2–1.0)
MONOS PCT: 7.7 %
NEUTROS ABS: 5 10*3/uL (ref 1.4–6.5)
NEUTROS PCT: 76.7 %
PLATELETS: 266 10*3/uL (ref 150–440)
RBC: 3.05 10*6/uL — ABNORMAL LOW (ref 4.40–5.90)
RDW: 17.9 % — ABNORMAL HIGH (ref 11.5–14.5)
WBC: 6.6 10*3/uL (ref 3.8–10.6)

## 2014-01-04 LAB — CREATININE, SERUM: CREATINE, SERUM: 2.3

## 2014-01-04 LAB — BASIC METABOLIC PANEL
ANION GAP: 6 — AB (ref 7–16)
BUN: 22 mg/dL — ABNORMAL HIGH (ref 7–18)
CHLORIDE: 103 mmol/L (ref 98–107)
CO2: 32 mmol/L (ref 21–32)
CREATININE: 2.3 mg/dL — AB (ref 0.60–1.30)
Calcium, Total: 8.2 mg/dL — ABNORMAL LOW (ref 8.5–10.1)
EGFR (Non-African Amer.): 29 — ABNORMAL LOW
GFR CALC AF AMER: 35 — AB
GLUCOSE: 122 mg/dL — AB (ref 65–99)
Osmolality: 286 (ref 275–301)
POTASSIUM: 3.5 mmol/L (ref 3.5–5.1)
SODIUM: 141 mmol/L (ref 136–145)

## 2014-01-07 LAB — CULTURE, BLOOD (SINGLE)

## 2014-01-10 ENCOUNTER — Encounter: Payer: Self-pay | Admitting: Physician Assistant

## 2014-01-10 NOTE — Progress Notes (Signed)
Patient Name: Patrick [name], Patrick [name] 1931-07-03, MRN [id number]  Date of Encounter: 01/12/2014  Primary Care Provider:  Maryland Pink, MD Primary Cardiologist:  Dr. [name], MD  Patient Profile:  80 y.o. male with history below here for hospital follow up after his recent admission to [facility] from 10/11-10/13 for dyspnea and acute on chronic diastolic CHF.    Problem List:   Past Medical History  Diagnosis Date  . Hypertension   . Diabetes mellitus without complication   . Hyperlipidemia   . History of gastroesophageal reflux (GERD)   . CKD (chronic kidney disease), stage III     a. stage III-IV  . Permanent atrial fibrillation     a. Dx 12/2011, Rate-controlled, chronic Xarelto (renal dosing).  . Bell's palsy   . Chronic diastolic CHF (congestive heart failure)     a. 11/2012 Echo: EF 60-65%, mod conc LVH, mildly dil LA/RA, mild Ao sclerosis w/o stenosis; b. stage III-IV    . Anemia of chronic disease     a. plan of oncology to start Procrit - received this during admission 12/2013   Past Surgical History  Procedure Laterality Date  . Finger surgery      right hand  . Colonoscopy  09/2011  . Esophagogastroduodenoscopy    . Appendectomy       Allergies:  No Known Allergies   HPI:  80 y.o. male with the above problem list who presents for hospital follow up.   He has a h/o permanent afib dating back to 12/2011 and has been on renal-dosed xarelto since.  He has been well rate-controlled using metoprolol.  He was previously on diltiazem but did not tolerate it 2/2 worsening lower extremity swelling.  He also has a h/o diast CHF with echo in 11/2012 showing nl LV fxn.  He has a h/o CKD III and is followed by nephrology as an outpt.  His wife indicates that his torsemide dose was cut back recently to 5 days/wk in the setting of rising creatinine.  Pt weighs himself daily and says that he has been running between 215-218, which is normal for him.  He presented to [facility]  on 10/12 with increased DOE. pBNP was found to be 2708. CXR showed mild CHF with bilateral pleural effusions. He was diuresed with IV Lasix 40 mg BID with good response. He does have a narrow euvolemic window 2/2 his CKD. He was restarted back on his home dose of torsemide 20 mg bid with double dose prn weight gain. H/H has been drifting down over the past year, has been seen by Oncology in the past with a plan for Procrit once hgb <10. Procrit and iron were given this past admission.    Today he states he is breathing much better since being discharged from the hospital. He is tolerating his medications without issues. His weight is at baseline. Activity level continues to increase.    Home Medications:  Prior to Admission medications   Medication Sig Start Date End Date Taking? Authorizing Provider  aspirin 81 MG tablet Take 81 mg by mouth daily.    Historical Provider, MD  cloNIDine (CATAPRES) 0.1 MG tablet Take 1 tablet (0.1 mg total) by mouth 2 (two) times daily. 05/10/13   Minna Merritts, MD  enalapril (VASOTEC) 5 MG tablet Take 5 mg by mouth daily.  07/16/13   Historical Provider, MD  Ferrous Sulfate (IRON) 325 (65 FE) MG TABS Take by mouth. Take one tablet twice a day.  Historical Provider, MD  glipiZIDE (GLUCOTROL) 10 MG tablet Take 10 mg by mouth 2 (two) times daily before a meal.     Historical Provider, MD  hydrALAZINE (APRESOLINE) 100 MG tablet TAKE ONE TABLET BY MOUTH THREE TIMES DAILY 07/19/13   Minna Merritts, MD  Insulin Lispro Prot & Lispro (HUMALOG MIX 75/25 [place]) Inject 70 Units into the skin 2 (two) times daily.    Historical Provider, MD  metoprolol (LOPRESSOR) 50 MG tablet TAKE ONE & ONE-HALF TABLETS (75MG ) BY MOUTH TWICE DAILY 07/21/13   Minna Merritts, MD  omeprazole (PRILOSEC) 40 MG capsule Take 40 mg by mouth daily.    Historical Provider, MD  pravastatin (PRAVACHOL) 40 MG tablet Take two tablets daily.    Historical Provider, MD  Rivaroxaban (XARELTO) 15 MG TABS  tablet Take 1 tablet (15 mg total) by mouth daily. 05/05/12   Minna Merritts, MD  torsemide (DEMADEX) 20 MG tablet TAKE ONE TABLET BY MOUTH TWICE DAILY AS NEEDED 02/16/13   Minna Merritts, MD  Vitamin D, Ergocalciferol, (DRISDOL) 50000 UNITS CAPS capsule Take 50,000 Units by mouth every 7 (seven) days.  07/29/13   Historical Provider, MD     Weights: Filed Weights   01/11/14 1512  Weight: 216 lb 8 oz (98.204 kg)     Review of Systems:  All other systems reviewed and are otherwise negative except as noted above.  Physical Exam:  Blood pressure 118/62, pulse 58, height 5\' 8"  (1.727 m), weight 216 lb 8 oz (98.204 kg).  General: Pleasant, NAD Psych: Normal affect. Neuro: Alert and oriented X 3. Moves all extremities spontaneously. HEENT: Normal  Neck: Supple without bruits or JVD. Lungs:  Resp regular and unlabored, CTA. Heart: RRR no s3, s4, or murmurs. Abdomen: Soft, non-tender, non-distended, BS + x 4.  Extremities: No clubbing, cyanosis or edema. DP/PT/Radials 2+ and equal bilaterally.   Accessory Clinical Findings:  EKG - a-fib, 58, nonspecific st/t changes  Assessment & Plan:  1. Chronic diastolic CHF: -Weight is at baseline -Appears euvolemic on exam today -Continue current medications  2. Anemia of chronic disease: -Received iron and Procrit while admitted -Has follow up appointment with Hem/Onc -Start low dose GentlesSorb - further management per Hem/Onc  3. CKD stage III-IV: -Followed by nephrology as an outpatient   4. HTN: -Controlled -Continue current medications  5. HLD: -Pravachol 40 mg   6. DM2: -Per PCP   Christell Faith, PA-C [place], [place] [address] [phone] [place] 01/12/2014, 1:03 PM

## 2014-01-11 ENCOUNTER — Encounter: Payer: Self-pay | Admitting: Physician Assistant

## 2014-01-11 ENCOUNTER — Ambulatory Visit (INDEPENDENT_AMBULATORY_CARE_PROVIDER_SITE_OTHER): Payer: Medicare HMO | Admitting: Physician Assistant

## 2014-01-11 VITALS — BP 118/62 | HR 58 | Ht 68.0 in | Wt 216.5 lb

## 2014-01-11 DIAGNOSIS — E1129 Type 2 diabetes mellitus with other diabetic kidney complication: Secondary | ICD-10-CM

## 2014-01-11 DIAGNOSIS — N058 Unspecified nephritic syndrome with other morphologic changes: Secondary | ICD-10-CM

## 2014-01-11 DIAGNOSIS — I10 Essential (primary) hypertension: Secondary | ICD-10-CM

## 2014-01-11 DIAGNOSIS — I5032 Chronic diastolic (congestive) heart failure: Secondary | ICD-10-CM

## 2014-01-11 DIAGNOSIS — D638 Anemia in other chronic diseases classified elsewhere: Secondary | ICD-10-CM

## 2014-01-11 DIAGNOSIS — E785 Hyperlipidemia, unspecified: Secondary | ICD-10-CM

## 2014-01-11 DIAGNOSIS — N183 Chronic kidney disease, stage 3 unspecified: Secondary | ICD-10-CM

## 2014-01-11 DIAGNOSIS — I4891 Unspecified atrial fibrillation: Secondary | ICD-10-CM

## 2014-01-11 NOTE — Patient Instructions (Signed)
Your physician recommends that you continue on your current medications as directed. Please refer to the Current Medication list given to you today.  Please try Gentlesorb iron 18 mg supplement from the local vitamin store  Please keep your follow up appt w/ Dr. [name] on 11/17

## 2014-01-12 ENCOUNTER — Ambulatory Visit: Payer: Self-pay | Admitting: Family

## 2014-01-23 ENCOUNTER — Ambulatory Visit: Payer: Self-pay | Admitting: Oncology

## 2014-01-25 ENCOUNTER — Ambulatory Visit: Payer: Self-pay | Admitting: Oncology

## 2014-02-02 ENCOUNTER — Ambulatory Visit: Payer: Self-pay | Admitting: Family

## 2014-02-04 DIAGNOSIS — D638 Anemia in other chronic diseases classified elsewhere: Secondary | ICD-10-CM | POA: Insufficient documentation

## 2014-02-07 LAB — CBC CANCER CENTER
BASOS ABS: 0.1 x10 3/mm (ref 0.0–0.1)
BASOS PCT: 1.2 %
Eosinophil #: 0.2 x10 3/mm (ref 0.0–0.7)
Eosinophil %: 3.1 %
HCT: 28.4 % — ABNORMAL LOW (ref 40.0–52.0)
HGB: 9 g/dL — AB (ref 13.0–18.0)
LYMPHS ABS: 0.8 x10 3/mm — AB (ref 1.0–3.6)
Lymphocyte %: 13.3 %
MCH: 26.7 pg (ref 26.0–34.0)
MCHC: 31.6 g/dL — ABNORMAL LOW (ref 32.0–36.0)
MCV: 84 fL (ref 80–100)
MONO ABS: 0.3 x10 3/mm (ref 0.2–1.0)
MONOS PCT: 5 %
Neutrophil #: 4.4 x10 3/mm (ref 1.4–6.5)
Neutrophil %: 77.4 %
PLATELETS: 255 x10 3/mm (ref 150–440)
RBC: 3.36 10*6/uL — ABNORMAL LOW (ref 4.40–5.90)
RDW: 20.5 % — ABNORMAL HIGH (ref 11.5–14.5)
WBC: 5.7 x10 3/mm (ref 3.8–10.6)

## 2014-02-07 LAB — IRON AND TIBC
Iron Bind.Cap.(Total): 374 ug/dL (ref 250–450)
Iron Saturation: 12 %
Iron: 44 ug/dL — ABNORMAL LOW (ref 65–175)
UNBOUND IRON-BIND. CAP.: 330 ug/dL

## 2014-02-07 LAB — FERRITIN: FERRITIN (ARMC): 72 ng/mL (ref 8–388)

## 2014-02-08 ENCOUNTER — Ambulatory Visit (INDEPENDENT_AMBULATORY_CARE_PROVIDER_SITE_OTHER): Payer: Commercial Managed Care - HMO | Admitting: Cardiovascular Disease

## 2014-02-08 ENCOUNTER — Encounter: Payer: Self-pay | Admitting: Cardiovascular Disease

## 2014-02-08 VITALS — BP 140/60 | HR 53 | Ht 68.0 in | Wt 213.5 lb

## 2014-02-08 DIAGNOSIS — I5032 Chronic diastolic (congestive) heart failure: Secondary | ICD-10-CM

## 2014-02-08 DIAGNOSIS — E785 Hyperlipidemia, unspecified: Secondary | ICD-10-CM

## 2014-02-08 DIAGNOSIS — I4891 Unspecified atrial fibrillation: Secondary | ICD-10-CM

## 2014-02-08 DIAGNOSIS — I10 Essential (primary) hypertension: Secondary | ICD-10-CM

## 2014-02-08 DIAGNOSIS — E1159 Type 2 diabetes mellitus with other circulatory complications: Secondary | ICD-10-CM

## 2014-02-08 DIAGNOSIS — D638 Anemia in other chronic diseases classified elsewhere: Secondary | ICD-10-CM

## 2014-02-08 NOTE — Assessment & Plan Note (Signed)
Blood pressure is well controlled on today's visit. No changes made to the medications. 

## 2014-02-08 NOTE — Progress Notes (Signed)
Patient ID: Patrick [name], male    DOB: [birth year], 80 y.o.   MRN: [id number]  HPI Comments: Patrick [name] is a very pleasant 80 year old gentleman with history of hypertension, diabetes, hyperlipidemia, GERD who presented to the hospital December 30 2011 with weakness, dizziness, found to be in atrial fibrillation with RVR,  started on rate controlling medications and discharged on Cardizem 300 mg daily with anticoagulation (xarelto). He has significant lower extremity edema with Cardizem and this was weaned off within increase in his beta blocker . He presents for routine followup Previous diagnosis of Bell's palsy  Hospital admission 01/03/2014 with shortness of breath, diastolic CHF, pleural effusions, anemia, responding well to diuresis  He presents today for routine follow-up of his diastolic CHF and atrial fibrillation His wife reports that his weight has been stable. He has been taking torsemide 20 mg twice a day with occasional extra torsemide for weight gain, leg swelling, abdominal bloating In general he reports that he feels well. Recent blood work was reviewed from 02/07/2014, [facility], showing hematocrit 28, hemoglobin 9.0. Iron level is low He is trying to get preapproved for what sounds like Procrit  Otherwise he denies any significant chest pain, shortness of breath with exertion. Denies any orthostatic type symptoms  Seen by CHF clinic on 01/12/2014. Note was reviewed. No new medication changes at that time  EKG shows atrial fibrillation with ventricular rate 53 bpm, no significant ST or T-wave changes  Past medical history presented to [facility] on 10/12 with increased DOE. pBNP was found to be 2708. CXR showed mild CHF with bilateral pleural effusions. He was diuresed with IV Lasix 40 mg BID with good response. He does have a narrow euvolemic window 2/2 his CKD. He was restarted back on his home dose of torsemide 20 mg bid with double dose prn weight gain. H/H has been  drifting down over the past year, has been seen by Oncology in the past with a plan for Procrit once hgb <10. Procrit and iron were given this past admission.    Previously not interested in cardioversion  He has been tolerating anticoagulation at renal dosing.  CT scan of the head in the hospital showed no intracranial process Lab work October 2013, creatinine 2.2, BUN 34 Lab work August 2013 , Hemoglobin A1c 8.1 Total cholesterol 97, LDL 48 creatinine 2.2, BUN in the 40s which is higher than his baseline BUN in the 20s. Wife reports he does not drink very much  Echocardiogram showed ejection fraction greater than 55%, mildly dilated left atrium, otherwise normal study MRI of the brain showed chronic changes without evidence of acute abnormalities   Outpatient Encounter Prescriptions as of 02/08/2014  Medication Sig  . aspirin 81 MG tablet Take 81 mg by mouth daily.  . cloNIDine (CATAPRES) 0.1 MG tablet Take 1 tablet (0.1 mg total) by mouth 2 (two) times daily.  . enalapril (VASOTEC) 5 MG tablet Take 5 mg by mouth daily.   . Ferrous Sulfate (IRON) 325 (65 FE) MG TABS Take by mouth. Take one tablet twice a day.  Marland Kitchen glipiZIDE (GLUCOTROL) 10 MG tablet Take 10 mg by mouth 2 (two) times daily before a meal.   . hydrALAZINE (APRESOLINE) 100 MG tablet TAKE ONE TABLET BY MOUTH THREE TIMES DAILY  . Insulin Lispro Prot & Lispro (HUMALOG MIX 75/25 [place]) Inject 70 Units into the skin 2 (two) times daily.  . metoprolol (LOPRESSOR) 50 MG tablet Take 1 tablet (50 mg total) by mouth 2 (two)  times daily.  Marland Kitchen omeprazole (PRILOSEC) 40 MG capsule Take 40 mg by mouth daily.  . pravastatin (PRAVACHOL) 40 MG tablet Take two tablets daily.  . Rivaroxaban (XARELTO) 15 MG TABS tablet Take 1 tablet (15 mg total) by mouth daily.  Marland Kitchen torsemide (DEMADEX) 20 MG tablet TAKE ONE TABLET BY MOUTH TWICE DAILY AS NEEDED  . Vitamin D, Ergocalciferol, (DRISDOL) 50000 UNITS CAPS capsule Take 50,000 Units by mouth every 7 (seven)  days.   . [DISCONTINUED] metoprolol (LOPRESSOR) 50 MG tablet TAKE ONE & ONE-HALF TABLETS (75MG ) BY MOUTH TWICE DAILY   Social history  reports that he quit smoking about 43 years ago. His smoking use included Cigars. He does not have any smokeless tobacco history on file. He reports that he does not drink alcohol or use illicit drugs.  Review of Systems  Constitutional: Negative.   HENT: Negative.   Respiratory: Negative.   Cardiovascular: Negative.   Gastrointestinal: Negative.   Musculoskeletal: Negative.   Neurological: Negative.   Psychiatric/Behavioral: Negative.   All other systems reviewed and are negative.   BP 140/60 mmHg  Pulse 53  Ht 5\' 8"  (1.727 m)  Wt 213 lb 8 oz (96.843 kg)  BMI 32.47 kg/m2  Physical Exam  Constitutional: He is oriented to person, place, and time. He appears well-developed and well-nourished.  HENT:  Head: Normocephalic.  Nose: Nose normal.  Mouth/Throat: Oropharynx is clear and moist.  Eyes: Conjunctivae are normal. Pupils are equal, round, and reactive to light.  Neck: Normal range of motion. Neck supple. No JVD present.  Cardiovascular: Normal rate, regular rhythm, S1 normal, S2 normal, normal heart sounds and intact distal pulses.  An irregularly irregular rhythm present. Exam reveals no gallop and no friction rub.   No murmur heard. Pulmonary/Chest: Effort normal and breath sounds normal. No respiratory distress. He has no wheezes. He has no rales. He exhibits no tenderness.  Abdominal: Soft. Bowel sounds are normal. He exhibits no distension. There is no tenderness.  Musculoskeletal: Normal range of motion. He exhibits no edema or tenderness.  Lymphadenopathy:    He has no cervical adenopathy.  Neurological: He is alert and oriented to person, place, and time. Coordination normal.  Skin: Skin is warm and dry. No rash noted. No erythema.  Psychiatric: He has a normal mood and affect. His behavior is normal. Judgment and thought content  normal.      Assessment and Plan   Nursing note and vitals reviewed.

## 2014-02-08 NOTE — Assessment & Plan Note (Signed)
Encouraged him to stay on his pravastatin 

## 2014-02-08 NOTE — Patient Instructions (Signed)
You are doing well.  Please decrease the metoprolol down to 1 pill twice a day (down from 1 12/ pills twice a day)  Please call us if you have new issues that need to be addressed before your next appt.  Your physician wants you to follow-up in: 6 months.  You will receive a reminder letter in the mail two months in advance. If you don't receive a letter, please call our office to schedule the follow-up appointment.

## 2014-02-08 NOTE — Assessment & Plan Note (Signed)
Weight is stable on torsemide 20 mg twice a day with extra torsemide as needed for shortness of breath No further medication changes at this time

## 2014-02-08 NOTE — Assessment & Plan Note (Addendum)
Heart rate is slowed today. We will decrease the metoprolol down to 50 mg twice a day. Continue anticoagulation Details of the EKG were discussed with the patient and his wife

## 2014-02-08 NOTE — Assessment & Plan Note (Signed)
Continues to be anemic with most recent hematocrit 28. He has follow-up with oncology/hematology Recommended he start a low-dose iron supplemental pill

## 2014-02-08 NOTE — Assessment & Plan Note (Signed)
We have encouraged continued exercise, careful diet management in an effort to lose weight. 

## 2014-02-22 ENCOUNTER — Ambulatory Visit: Payer: Self-pay | Admitting: Oncology

## 2014-03-21 ENCOUNTER — Other Ambulatory Visit: Payer: Self-pay | Admitting: *Deleted

## 2014-03-21 MED ORDER — TORSEMIDE 20 MG PO TABS: ORAL_TABLET | ORAL | Status: DC

## 2014-03-21 NOTE — Telephone Encounter (Signed)
Please fill through [facility]

## 2014-04-06 DIAGNOSIS — I10 Essential (primary) hypertension: Secondary | ICD-10-CM | POA: Diagnosis not present

## 2014-04-06 DIAGNOSIS — D631 Anemia in chronic kidney disease: Secondary | ICD-10-CM | POA: Diagnosis not present

## 2014-04-06 DIAGNOSIS — N189 Chronic kidney disease, unspecified: Secondary | ICD-10-CM | POA: Diagnosis not present

## 2014-04-06 DIAGNOSIS — N184 Chronic kidney disease, stage 4 (severe): Secondary | ICD-10-CM | POA: Diagnosis not present

## 2014-04-06 DIAGNOSIS — E1122 Type 2 diabetes mellitus with diabetic chronic kidney disease: Secondary | ICD-10-CM | POA: Diagnosis not present

## 2014-04-11 ENCOUNTER — Ambulatory Visit: Payer: Self-pay | Admitting: Family

## 2014-04-11 DIAGNOSIS — K219 Gastro-esophageal reflux disease without esophagitis: Secondary | ICD-10-CM | POA: Diagnosis not present

## 2014-04-11 DIAGNOSIS — I4891 Unspecified atrial fibrillation: Secondary | ICD-10-CM | POA: Diagnosis not present

## 2014-04-11 DIAGNOSIS — I5042 Chronic combined systolic (congestive) and diastolic (congestive) heart failure: Secondary | ICD-10-CM | POA: Diagnosis not present

## 2014-04-11 DIAGNOSIS — N289 Disorder of kidney and ureter, unspecified: Secondary | ICD-10-CM | POA: Diagnosis not present

## 2014-04-11 DIAGNOSIS — I110 Hypertensive heart disease with heart failure: Secondary | ICD-10-CM | POA: Diagnosis not present

## 2014-04-11 DIAGNOSIS — E785 Hyperlipidemia, unspecified: Secondary | ICD-10-CM | POA: Diagnosis not present

## 2014-04-11 DIAGNOSIS — E119 Type 2 diabetes mellitus without complications: Secondary | ICD-10-CM | POA: Diagnosis not present

## 2014-04-11 DIAGNOSIS — G510 Bell's palsy: Secondary | ICD-10-CM | POA: Diagnosis not present

## 2014-04-11 DIAGNOSIS — D649 Anemia, unspecified: Secondary | ICD-10-CM | POA: Diagnosis not present

## 2014-05-02 ENCOUNTER — Ambulatory Visit: Payer: Self-pay | Admitting: Oncology

## 2014-05-02 DIAGNOSIS — Z79899 Other long term (current) drug therapy: Secondary | ICD-10-CM | POA: Diagnosis not present

## 2014-05-02 DIAGNOSIS — G510 Bell's palsy: Secondary | ICD-10-CM | POA: Diagnosis not present

## 2014-05-02 DIAGNOSIS — D631 Anemia in chronic kidney disease: Secondary | ICD-10-CM | POA: Diagnosis not present

## 2014-05-02 DIAGNOSIS — I4891 Unspecified atrial fibrillation: Secondary | ICD-10-CM | POA: Diagnosis not present

## 2014-05-02 DIAGNOSIS — K219 Gastro-esophageal reflux disease without esophagitis: Secondary | ICD-10-CM | POA: Diagnosis not present

## 2014-05-02 DIAGNOSIS — N189 Chronic kidney disease, unspecified: Secondary | ICD-10-CM | POA: Diagnosis not present

## 2014-05-02 DIAGNOSIS — E119 Type 2 diabetes mellitus without complications: Secondary | ICD-10-CM | POA: Diagnosis not present

## 2014-05-02 DIAGNOSIS — E611 Iron deficiency: Secondary | ICD-10-CM | POA: Diagnosis not present

## 2014-05-02 DIAGNOSIS — E785 Hyperlipidemia, unspecified: Secondary | ICD-10-CM | POA: Diagnosis not present

## 2014-05-02 LAB — CBC CANCER CENTER
BASOS PCT: 1.4 %
Basophil #: 0.1 x10 3/mm (ref 0.0–0.1)
EOS PCT: 3.8 %
Eosinophil #: 0.2 x10 3/mm (ref 0.0–0.7)
HCT: 28.4 % — AB (ref 40.0–52.0)
HGB: 9.4 g/dL — AB (ref 13.0–18.0)
Lymphocyte #: 0.7 x10 3/mm — ABNORMAL LOW (ref 1.0–3.6)
Lymphocyte %: 17.8 %
MCH: 28 pg (ref 26.0–34.0)
MCHC: 33 g/dL (ref 32.0–36.0)
MCV: 85 fL (ref 80–100)
MONO ABS: 0.2 x10 3/mm (ref 0.2–1.0)
Monocyte %: 5.7 %
NEUTROS PCT: 71.3 %
Neutrophil #: 3 x10 3/mm (ref 1.4–6.5)
Platelet: 295 x10 3/mm (ref 150–440)
RBC: 3.34 10*6/uL — AB (ref 4.40–5.90)
RDW: 16.3 % — ABNORMAL HIGH (ref 11.5–14.5)
WBC: 4.1 x10 3/mm (ref 3.8–10.6)

## 2014-05-02 LAB — IRON AND TIBC
IRON BIND. CAP.(TOTAL): 365 ug/dL (ref 250–450)
IRON: 57 ug/dL — AB (ref 65–175)
Iron Saturation: 16 %
UNBOUND IRON-BIND. CAP.: 308 ug/dL

## 2014-05-02 LAB — FERRITIN: Ferritin (ARMC): 93 ng/mL (ref 8–388)

## 2014-05-09 DIAGNOSIS — I10 Essential (primary) hypertension: Secondary | ICD-10-CM | POA: Diagnosis not present

## 2014-05-09 DIAGNOSIS — N184 Chronic kidney disease, stage 4 (severe): Secondary | ICD-10-CM | POA: Diagnosis not present

## 2014-05-16 DIAGNOSIS — I10 Essential (primary) hypertension: Secondary | ICD-10-CM | POA: Diagnosis not present

## 2014-05-16 DIAGNOSIS — R809 Proteinuria, unspecified: Secondary | ICD-10-CM | POA: Diagnosis not present

## 2014-05-16 DIAGNOSIS — E1122 Type 2 diabetes mellitus with diabetic chronic kidney disease: Secondary | ICD-10-CM | POA: Diagnosis not present

## 2014-05-16 DIAGNOSIS — N184 Chronic kidney disease, stage 4 (severe): Secondary | ICD-10-CM | POA: Diagnosis not present

## 2014-05-24 ENCOUNTER — Ambulatory Visit
Admit: 2014-05-24 | Disposition: A | Discharge status: Home / Self Care | Payer: Self-pay | Attending: Oncology | Admitting: Oncology

## 2014-05-26 DIAGNOSIS — E1122 Type 2 diabetes mellitus with diabetic chronic kidney disease: Secondary | ICD-10-CM | POA: Diagnosis not present

## 2014-05-26 DIAGNOSIS — I10 Essential (primary) hypertension: Secondary | ICD-10-CM | POA: Diagnosis not present

## 2014-05-26 DIAGNOSIS — N184 Chronic kidney disease, stage 4 (severe): Secondary | ICD-10-CM | POA: Diagnosis not present

## 2014-05-26 DIAGNOSIS — D631 Anemia in chronic kidney disease: Secondary | ICD-10-CM | POA: Diagnosis not present

## 2014-05-30 DIAGNOSIS — E611 Iron deficiency: Secondary | ICD-10-CM | POA: Diagnosis not present

## 2014-05-30 DIAGNOSIS — I4891 Unspecified atrial fibrillation: Secondary | ICD-10-CM | POA: Diagnosis not present

## 2014-05-30 DIAGNOSIS — Z79899 Other long term (current) drug therapy: Secondary | ICD-10-CM | POA: Diagnosis not present

## 2014-05-30 DIAGNOSIS — E119 Type 2 diabetes mellitus without complications: Secondary | ICD-10-CM | POA: Diagnosis not present

## 2014-05-30 DIAGNOSIS — D631 Anemia in chronic kidney disease: Secondary | ICD-10-CM | POA: Diagnosis not present

## 2014-05-30 DIAGNOSIS — K219 Gastro-esophageal reflux disease without esophagitis: Secondary | ICD-10-CM | POA: Diagnosis not present

## 2014-05-30 DIAGNOSIS — E785 Hyperlipidemia, unspecified: Secondary | ICD-10-CM | POA: Diagnosis not present

## 2014-05-30 DIAGNOSIS — G510 Bell's palsy: Secondary | ICD-10-CM | POA: Diagnosis not present

## 2014-05-30 DIAGNOSIS — N189 Chronic kidney disease, unspecified: Secondary | ICD-10-CM | POA: Diagnosis not present

## 2014-06-08 DIAGNOSIS — E1129 Type 2 diabetes mellitus with other diabetic kidney complication: Secondary | ICD-10-CM | POA: Diagnosis not present

## 2014-06-15 DIAGNOSIS — N184 Chronic kidney disease, stage 4 (severe): Secondary | ICD-10-CM | POA: Diagnosis not present

## 2014-06-15 DIAGNOSIS — E1122 Type 2 diabetes mellitus with diabetic chronic kidney disease: Secondary | ICD-10-CM | POA: Diagnosis not present

## 2014-06-15 DIAGNOSIS — Z794 Long term (current) use of insulin: Secondary | ICD-10-CM | POA: Diagnosis not present

## 2014-06-15 DIAGNOSIS — R634 Abnormal weight loss: Secondary | ICD-10-CM | POA: Diagnosis not present

## 2014-06-23 ENCOUNTER — Ambulatory Visit
Admit: 2014-06-23 | Disposition: A | Discharge status: Home / Self Care | Payer: Self-pay | Attending: Family | Admitting: Family

## 2014-06-23 DIAGNOSIS — Z79899 Other long term (current) drug therapy: Secondary | ICD-10-CM | POA: Diagnosis not present

## 2014-06-23 DIAGNOSIS — K219 Gastro-esophageal reflux disease without esophagitis: Secondary | ICD-10-CM | POA: Diagnosis not present

## 2014-06-23 DIAGNOSIS — I5030 Unspecified diastolic (congestive) heart failure: Secondary | ICD-10-CM | POA: Diagnosis not present

## 2014-06-23 DIAGNOSIS — E785 Hyperlipidemia, unspecified: Secondary | ICD-10-CM | POA: Diagnosis not present

## 2014-06-23 DIAGNOSIS — I10 Essential (primary) hypertension: Secondary | ICD-10-CM | POA: Diagnosis not present

## 2014-06-23 DIAGNOSIS — Z7982 Long term (current) use of aspirin: Secondary | ICD-10-CM | POA: Diagnosis not present

## 2014-06-23 DIAGNOSIS — Z794 Long term (current) use of insulin: Secondary | ICD-10-CM | POA: Diagnosis not present

## 2014-06-23 DIAGNOSIS — I4891 Unspecified atrial fibrillation: Secondary | ICD-10-CM | POA: Diagnosis not present

## 2014-06-23 DIAGNOSIS — E119 Type 2 diabetes mellitus without complications: Secondary | ICD-10-CM | POA: Diagnosis not present

## 2014-06-24 ENCOUNTER — Ambulatory Visit
Admit: 2014-06-24 | Disposition: A | Discharge status: Home / Self Care | Payer: Self-pay | Attending: Oncology | Admitting: Oncology

## 2014-06-27 DIAGNOSIS — E119 Type 2 diabetes mellitus without complications: Secondary | ICD-10-CM | POA: Diagnosis not present

## 2014-06-27 DIAGNOSIS — K219 Gastro-esophageal reflux disease without esophagitis: Secondary | ICD-10-CM | POA: Diagnosis not present

## 2014-06-27 DIAGNOSIS — Z79899 Other long term (current) drug therapy: Secondary | ICD-10-CM | POA: Diagnosis not present

## 2014-06-27 DIAGNOSIS — G510 Bell's palsy: Secondary | ICD-10-CM | POA: Diagnosis not present

## 2014-06-27 DIAGNOSIS — E785 Hyperlipidemia, unspecified: Secondary | ICD-10-CM | POA: Diagnosis not present

## 2014-06-27 DIAGNOSIS — N189 Chronic kidney disease, unspecified: Secondary | ICD-10-CM | POA: Diagnosis not present

## 2014-06-27 DIAGNOSIS — D631 Anemia in chronic kidney disease: Secondary | ICD-10-CM | POA: Diagnosis not present

## 2014-06-27 DIAGNOSIS — E611 Iron deficiency: Secondary | ICD-10-CM | POA: Diagnosis not present

## 2014-06-27 DIAGNOSIS — I4891 Unspecified atrial fibrillation: Secondary | ICD-10-CM | POA: Diagnosis not present

## 2014-06-27 LAB — CANCER CENTER HEMOGLOBIN: HGB: 8.4 g/dL — ABNORMAL LOW (ref 13.0–18.0)

## 2014-07-05 DIAGNOSIS — I10 Essential (primary) hypertension: Secondary | ICD-10-CM | POA: Diagnosis not present

## 2014-07-05 DIAGNOSIS — N184 Chronic kidney disease, stage 4 (severe): Secondary | ICD-10-CM | POA: Diagnosis not present

## 2014-07-05 DIAGNOSIS — R809 Proteinuria, unspecified: Secondary | ICD-10-CM | POA: Diagnosis not present

## 2014-07-05 DIAGNOSIS — N2581 Secondary hyperparathyroidism of renal origin: Secondary | ICD-10-CM | POA: Diagnosis not present

## 2014-07-05 DIAGNOSIS — D631 Anemia in chronic kidney disease: Secondary | ICD-10-CM | POA: Diagnosis not present

## 2014-07-12 NOTE — Discharge Summary (Signed)
PATIENT NAME:  Patrick [name], Patrick [name] MR#:  [id number] DATE OF BIRTH:  [birth year]  DATE OF ADMISSION:  12/29/2011 DATE OF DISCHARGE:  01/01/2012  DISCHARGE DIAGNOSES:  1. Weakness/dizziness with negative complete neurological work-up, improving with therapy. 2. New onset atrial fibrillation, rate controlled on Cardizem and beta blocker, on full dose anticoagulation with Xarelto.  3. Pneumonia, improving with antibiotic.  4. Malignant hypertension, now resolved.  5. Chronic kidney disease stage II to III. Creatinine close to baseline now.  6. Diabetes type 2, Metformin stopped considering poor creatinine clearance. Started on glipizide and recommended outpatient endocrine evaluation.  7. Hyponatremia/hypokalemia repleted and resolved.   SECONDARY DIAGNOSES:  1. Hypertension.  2. Diabetes.  3. Hyperlipidemia.  4. History of gastroesophageal reflux disease.   CONSULTANTS:  1. Gurney Maxin, MD - Neurology. 2. Ida Rogue, MD - Cardiology. 3. Physical Therapy.   PROCEDURES/RADIOLOGICAL DATA: CT scan of the head without contrast on 12/29/2011 showed no acute intracranial process.   Chest x-ray on 12/29/2011 showed possible left-sided pneumonia.   Chest x-ray on 12/30/2011 showed left lower lobe pneumonia.   MRI of the brain without contrast on 12/30/2011 showed chronic and involutional changes without evidence of acute abnormality.   2-D echocardiogram on 12/30/2011 showed normal LV systolic function, ejection fraction more than 55%, and mild concentric left ventricular hypertrophy.   MAJOR LABORATORY PANEL: Urinalysis on admission was negative.   Blood cultures x2 were negative, on 12/30/2011.   Urine culture grew 3000 colonies of gram-positive cocci.   Influenza A and B were negative.   HISTORY AND SHORT HOSPITAL COURSE: The patient is a 81 year old male with the above-mentioned medical problems who was admitted for weakness and dizziness. The patient had acute onset gait  disturbances for which neurological consultation was obtained along with CT scan of the head and MRI of the brain, as per their recommendation, which were all negative. Neurology did not feel etiology for this was related to pure neurological event. They recommended medical management of other problems including pneumonia and atrial fibrillation, which was done. Cardiology consultation was obtained with Dr. [name] considering new onset atrial fibrillation. He recommended anticoagulation with Xarelto and the patient was started on Cardizem. His rate was well controlled on Cardizem and beta blocker. He was doing much better. He also had some malignant hypertension which has resolved. Considering his poor creatinine clearance, his metformin was stopped and was started on new diabetes medication (glipizide). He also had some hyponatremia and hypokalemia which was repleted and resolved. He was doing much better on 01/01/2012 and was discharged home in stable condition.   DISCHARGE PHYSICAL EXAMINATION:   VITAL SIGNS: On the date of discharge his temperature was 98.6, heart rate 81 per minute, respirations 18 per minute, blood pressure 137/74 mmHg, and he was saturating 96% on room air.  CARDIOVASCULAR: Irregularly irregular heart sounds. No murmurs, rubs, or gallops.   LUNGS: Clear to auscultation bilaterally. No wheezing, rales, rhonchi, or crepitation.   ABDOMEN: Soft, benign.   NEUROLOGIC: Nonfocal examination.   All other physical examination remained at baseline.   DISCHARGE MEDICATIONS:  1. Hydrochlorothiazide 25 mg p.o. daily. 2. Cardizem 300 mg p.o. daily. 3. Aspirin 81 mg p.o. daily.  4. Omeprazole 40 mg p.o. daily.  5. Enalapril 20 mg p.o. daily.  6. Pravastatin 40 mg p.o. at bedtime.  7. Xarelto 15 mg p.o. daily. 8. Glipizide 10 mg p.o. daily. 9. Levaquin 750 mg p.o. every 48 hours to finish rest of course. 10. Hydralazine 50 mg  p.o. three times daily.  DISCHARGE DIET: Low sodium,  1800 ADA.   DISCHARGE ACTIVITY: As tolerated.   DISCHARGE INSTRUCTIONS AND FOLLOW-UP: The patient was instructed to follow-up with his primary care physician, Dr. Maryland Pink, in 1 to 2 weeks. He was instructed to follow-up with Dr. [name] from cardiology in 1 to 2 weeks and [place] in 4 to 6 weeks. He was instructed to stop metformin and get CBC and basic metabolic panel check in one week with results forwarded to primary care physician and kidney doctors. He was also                  requested to follow up with [facility] endocrine physician in 2 to 3 weeks for diabetes mellitus evaluation and management.   TOTAL TIME DISCHARGING THIS PATIENT: 55 minutes.  ____________________________ Lucina Mellow. Manuella Ghazi, MD vss:slb D: 01/05/2012 09:33:32 ET T: 01/05/2012 12:56:51 ET JOB#: [id number]  cc: Almir Botts S. Manuella Ghazi, MD, <Dictator> Irven Easterly. Kary Kos, MD Minna Merritts, MD [facility] [place] [facility] [place] Nakayama, MD [facility] Endocrinology Lucina Mellow [facility] MD ELECTRONICALLY SIGNED 01/05/2012 16:09

## 2014-07-12 NOTE — Consult Note (Signed)
Referring Physician:  Epifanio Lesches :   Primary Care Physician:  Epifanio Lesches : [facility], [facility], [address], [place], [place] [address]  Jarome Lamas : [facility], [address] [place], [place] [address], [phone]  Reason for Consult:  Admit Date: 29-Dec-2011   Chief Complaint: weakness   History of Present Illness:  History of Present Illness:   PATIENT NAME:  Patrick [name], Patrick [name] [id number] OF BIRTH:  1931-10-11 OF ADMISSION:  12/29/2011 COMPLAINT: Weakness and dizziness.  OF PRESENT ILLNESS: man with HTN, HLP, and DM presented to  because of generalized weakness and dizziness.   had abrupt onset difficulty walking yesterday.  Normally his walking is normal without assistive device.  This was first noticed when they were coming home from church and the patient did not come in the house.  They found him stuck in his car unable to get out of it on his own power.  The patient was taken to the ER and found to have afib w RVR.  There were no other associated symptoms with his abrupt onset inability to ambulate.  He denies any focal weakness or numbness.  There was no double vision, vision cut, dysarthria.  He says that his legs just felt heavy like he couldnt move them.  Since that time he has had difficulty standing up due to poor balance and generalized weakness.  Nothing seems to make the symptoms better or worse.  The symptoms are constant.  There is no headache.   says he has had events in the past where he loses his balance and starts walking too quickly.       MEDICAL HISTORY:  1. Hypertension. 2. Diabetes. High cholesterol. History of gastroesophageal reflux disease.  SURGICAL HISTORY:  1. Fingers on the right hand. 2. Colonoscopy this year, in July, and esophagogastroduodenoscopy. The patient's colonoscopy showed some diverticula in sigmoid colon and polyps in the descending colon. The patient's EGD showed Barrett's  esophagus. Otherwise normal study. HISTORY: The patient was a previous smoker, quit now. No alcohol. No drugs. Lives with his family.  HISTORY:  No known FH of neurologic disease. MEDICATIONS: 1. Aspirin 81 mg daily.  2. Cardizem 300 mg extended-release daily.  Enalapril 20 mg daily.  HCTZ 25 mg daily. Metformin 1 gram p.o. twice a day. Omeprazole 40 mg daily. Pravastatin 40 mg daily.  No known drug allergies.   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  Normocephalic and atraumatic. exam without pharmacologic dilation shows normal disc size, appearance and C/D ratio.  There is no papilledema. and S2 sounds are within normal limits, without murmurs, gallops, or rubs. - Normal- NormalDrift - Absent bilaterally.- He is able to stand with two assist and cannot balance without holding on to two persons for support.  We did not attempt ambulation given his difficulty with standing.right hand is disfigured due to a prior machine injury while working at [employer]. is excellent.   Shoulder abduction (deltoid/supraspinatus, axillary/suprascapular n, C5)   Elbow flexion (biceps brachii, musculoskeletal n, C5-6)   Elbow extension (triceps, radial n, C7)   Finger adduction (interossei, ulnar n, T1)    Hip flexion (iliopsoas, L1/L2)   Knee flexion (hamstrings, sciatic n, L5/S1)    Knee extension (quadriceps, femoral n, L3/4)   Ankle dorsiflexion (tibialis anterior, deep fibular n, L4/5)   Ankle plantarflexion (gastroc, tibial n, S1)  STATUS.is oriented to person, place and time.  Recent and remote memory are intact.  Attention span  and concentration are intact.  Naming, repetition, comprehension and expressive speech are within normal limits, he is noted to be edentulous, his edentulous speech is normal per his family.  Patient's fund of knowledge is within normal limits for educational level. NERVES.   CN II (normal visual acuity and visual fields)   CN III, IV, VI (extraocular muscles are intact)   CN V  (facial sensation is intact bilaterally)   CN VII (facial strength is intact bilaterally)   CN VIII (hearing is intact bilaterally)   CN IX/X (palate elevates midline, normal phonation)   CN XI (shoulder shrug strength is normal and symmetric)   CN XII (tongue protrudes midline) to pain and temp bilaterally (spinothalamic tracts)to position and vibration bilaterally (dorsal columns) are 1+ throughout bilaterally.   to nose testing is within normal limits bilaterally.  Heel to shin testing is also within normal limits.    MR BRAIN WO CONTRAST  - Dec 30 2011  2:11PM   Multiplanar and multisequence imaging of the brain was without the administration of gadolinium. The diffusion weighted imaging demonstrates no evidence of foci of architectural distortion. There is no evidence of nor extra-axial fluid collections. There is diffuse increased signal within the subcortical, deep, and periventricular white matter A focal area of increased T2 signal projects in the medial  of the basal ganglia on the right. There is no evidence of or tonsillar herniation. There is mild diffuse cortical The sella and parasellar regions and structures as well as the angle regions and visualized portions of the 7th and 7th nerves demonstrate no signal abnormalities. The cerebellum and demonstrate no signal abnormalities. Note; the area of lacunar which appears to project within the medial base of the basal alternatively may project anteriorly within the right lobe of the   Chronic and involutional changes without evidence of acute   81 year old man with HTN, HLP, and DM presented to [facility] because of generalized weakness and dizziness.  He had abrupt onset difficulty walking yesterday.  Normally his walking is normal without assistive device.  The patient does not appear to have any focal neurologic deficits on exam.  His strength is excellent and there are no abnormal cerebellar findings.  He does not have vertigo or headache to  suggest vertiginous migraine or posterior circulation infarction.  The persistence of his symptoms with a normal MRI makes a cerebrovascular etiology unlikely.  There is no sensory level or paraparesis in the lower extremities to suggest a cord lesion such as a cord infarction.  His TSH is slightly low but his thyroxine is within normal limits.  He has been in afib with RVR.  His creatinine was elevated upon admission suggesting dehydration.  He has carried a low grade fever since admission.  His metabolic panel shows elevated blood sugars.   Based on the findings so far, and in the setting of a normal motor, sensory and cerebellar exam, and with a normal brain MRI, that his symptoms are unlikely to be the effect of a primary neurologic condition.  I think his worsening ambulation is more likely to be due to generalized weakness as a result of the combination of viral illness with persistent fevers and dehydration.   Brain MRI/A - I have personally viewed the MRI and agree with findings that there does not appear to be an acute stroke.  There is a focal area of increased T2 signal projects in the medial aspect of the basal ganglia on the right side.  There  is no acute stroke to explain his symptoms.   Please check a CKCarotid doppler to rule out carotid artery stenosisPlease check for any new medications that have been recently addedCycle cardiac enzymesPatient needs PT daily Melrose Nakayama, MD     ROS:   General denies complaints    HEENT no complaints    Lungs no complaints    Cardiac no complaints    GI no complaints    GU no complaints    Musculoskeletal no complaints    Extremities no complaints    Skin no complaints    Psych no complaints   Past Medical/Surgical Hx:  Diabetes:   Right Finger Amputation:   Home Medications: Medication Instructions Last Modified Date/Time  hydrochlorothiazide 25 mg oral tablet 1 tab(s) orally once a day 06-Oct-13 14:56  diltiazem 300 mg/24 hours oral  capsule, extended release 1 cap(s) orally once a day 06-Oct-13 14:56  aspirin 81 mg oral tablet 1 tab(s) orally once a day 06-Oct-13 14:56  omeprazole 40 mg oral delayed release capsule 1 cap(s) orally once a day 06-Oct-13 14:56  enalapril 20 mg oral tablet 1 tab(s) orally once a day 06-Oct-13 14:56  metformin 1000 mg oral tablet 1 tab(s) orally 2 times a day 06-Oct-13 14:56  pravastatin 40 mg oral tablet 1 tab(s) orally once a day (at bedtime) 06-Oct-13 14:56   Allergies:  No Known Allergies:   Vital Signs: **Vital Signs.:   07-Oct-13 15:18   Vital Signs Type Routine   Temperature Temperature (F) 99.2   Celsius 37.3   Temperature Source Oral   Pulse Pulse 72   Respirations Respirations 18   Systolic BP Systolic BP 681   Diastolic BP (mmHg) Diastolic BP (mmHg) 82   Mean BP 106   Pulse Ox % Pulse Ox % 94   Pulse Ox Activity Level  At rest   Oxygen Delivery 4L   Lab Results:  Thyroid:  06-Oct-13 15:19    Thyroxine, Free 0.98 (Result(s) reported on 29 Dec 2011 at 08:21PM.)  Hepatic:  06-Oct-13 15:19    Bilirubin, Total 0.8   Alkaline Phosphatase 79   SGPT (ALT) 21   SGOT (AST) 21   Total Protein, Serum 7.2   Albumin, Serum  3.2  Routine Micro:  06-Oct-13 15:57    Micro Text Report URINE CULTURE   ORGANISM 1                3000 CFU/ML GRAM POSITIVE COCCI   COMMENT                   -   ANTIBIOTIC                        Organism Name GRAM POSITIVE COCCI   Organism Quantity 3000 CFU/ML   Specimen Source CC   Organism 1 3000 CFU/ML GRAM POSITIVE COCCI   Culture Comment - (Result(s) reported on 30 Dec 2011 at 11:19AM.)  07-Oct-13 11:14    Micro Text Report INFLUENZA A+B ANTIGENS   COMMENT                   NEGATIVE FOR INFLUENZA A (ANTIGEN ABSENT)   COMMENT                   NEGATIVE FOR INFLUENZA B (ANTIGEN ABSENT)   ANTIBIOTIC                        Comment  1.. NEGATIVE FOR INFLUENZA A (ANTIGEN ABSENT) A negative result does not exclude influenza. Correlation with  clinical impression is required.   Comment 2.. NEGATIVE FOR INFLUENZA B (ANTIGEN ABSENT)  Result(s) reported on 30 Dec 2011 at 01:04PM.  Cardiology:  06-Oct-13 17:51    Ventricular Rate 106   Atrial Rate 101   QRS Duration 72   QT 338   QTc 448   R Axis 8   T Axis 148   ECG interpretation Atrial fibrillation with rapid ventricular response ST & T wave abnormality, consider lateral ischemia or digitalis effect Abnormal ECG When compared with ECG of 17-Apr-2010 22:44, Atrial fibrillation has replaced Sinus rhythm ST now depressed in Lateral leads Nonspecific T wave abnormality no longer evident in Anterior leads T wave inversion now evident in Lateral leads ----------unconfirmed---------- Confirmed by OVERREAD, NOT ([id number]), editor PEARSON, BARBARA ([id number]) on 12/30/2011 8:47:30 AM  07-Oct-13 08:14    Echo Doppler  Interpretation Summary   Essentially a normal study The left ventricular ejection fraction is  normal. Ejection Fraction = >55%. There is mild concentric left  ventricular hypertrophy. The right ventricular systolic function is  normal. The left atrium is mildly dilated. Right ventricular  systolic pressure is normal.  Left Ventricle   The left ventricle is normal in size.   There is mild concentric left ventricular hypertrophy.   Proximal septal thickening is noted.   The left ventricular wall motion is normal.   The left ventricular ejection fraction is normal.   Ejection Fraction = >55%.   The transmitral spectral Doppler flow pattern is normal for age.  Right Ventricle   The right ventricle is normal size.   The right ventricular systolic function is normal.  Atria   The left atrium is mildly dilated.   Right atrial size is normal.   There is no Doppler evidence for an atrial septal defect.  Mitral Valve   The mitral valve leaflets appear normal. There is no evidence of  stenosis, fluttering, or prolapse.   There is no mitral valve stenosis.   There is trace  mitral regurgitation.  Tricuspid Valve   The tricuspid valve is normal.   There is trace tricuspid regurgitation.   Right ventricular systolic pressure is normal.  Aortic Valve   The aortic valve opens well.   No aortic regurgitation is present.   No hemodynamically significant valvular aortic stenosis.  Pulmonic Valve   The pulmonic valve leaflets are thin and pliable; valve motion is  normal.   Trace pulmonic valvular regurgitation.  Great Vessels   The aortic root is normal size.   The pulmonary is not well visualized.  Pericardium/Pleural   There is no pleural effusion.   No pericardial effusion.  MMode 2D Measurements and Calculations   RVDd: 2.8 cm   IVSd: 1.6 cm   LVIDd: 3.8 cm   LVIDs: 2.6 cm   LVPWd: 1.5 cm   FS: 31 %   EF(Teich): 59 %   Ao root diam: 3.4 cm   LA dimension: 4.6 cm   LVOT diam: 2.0 cm  Doppler Measurements and Calculations   MV E point: 138 cm/sec   MV A point: 61 cm/sec   MV E/A: 2.3    MV dec time: 0.21 sec   Ao V2 max: 105 cm/sec   Ao max PG: 4.0 mmHg   AVA(V,D): 3.1 cm2   LV max PG: 4.0 mmHg   LV V1 max: 105 cm/sec   PA V2 max: 106 cm/sec  PA max PG: 4.34mHg   TR Max vel: 198 cm/sec   TR Max PG: 16 mmHg  Reading Physician: GIda Rogue Sonographer: HSherrie SportInterpreting Physician:  TIda Rogue  electronically signed on  12-30-2011 08:55:08 Requesting Physician: GIda Rogue Routine Chem:  06-Oct-13 15:19    Glucose, Serum  144   BUN 17   Creatinine (comp)  1.70   Sodium, Serum 140   Potassium, Serum 3.5   Chloride, Serum 104   CO2, Serum 27   Calcium (Total), Serum  8.4   Anion Gap 9   Osmolality (calc) 283   eGFR (African American)  44   eGFR (Non-African American)  38 (eGFR values <616mmin/1.73 m2 may be an indication of chronic kidney disease (CKD). Calculated eGFR is useful in patients with stable renal function. The eGFR calculation will not be reliable in acutely ill patients when serum  creatinine is changing rapidly. It is not useful in  patients on dialysis. The eGFR calculation may not be applicable to patients at the low and high extremes of body sizes, pregnant women, and vegetarians.)   Magnesium, Serum  1.4 (1.8-2.4 THERAPEUTIC RANGE: 4-7 mg/dL TOXIC: > 10 mg/dL  -----------------------)    23:27    Result Comment troponin - RESULTS VERIFIED BY REPEAT TESTING.  - mpg c/ kacey woodell @ 0010 12/30/11  - READ-BACK PROCESS PERFORMED.  Result(s) reported on 30 Dec 2011 at 12:10AM.  07-Oct-13 06:17    Glucose, Serum  182   BUN 18   Creatinine (comp)  1.56   Sodium, Serum 136   Potassium, Serum 3.7   Chloride, Serum 99   CO2, Serum 26   Calcium (Total), Serum  8.2   Anion Gap 11   Osmolality (calc) 278   eGFR (African American)  49   eGFR (Non-African American)  42 (eGFR values <602min/1.73 m2 may be an indication of chronic kidney disease (CKD). Calculated eGFR is useful in patients with stable renal function. The eGFR calculation will not be reliable in acutely ill patients when serum creatinine is changing rapidly. It is not useful in  patients on dialysis. The eGFR calculation may not be applicable to patients at the low and high extremes of body sizes, pregnant women, and vegetarians.)   Magnesium, Serum  1.7 (1.8-2.4 THERAPEUTIC RANGE: 4-7 mg/dL TOXIC: > 10 mg/dL  -----------------------)   Hemoglobin A1c ([facility])  8.1 (The American Diabetes Association recommends that a primary goal of therapy should be <7% and that physicians should reevaluate the treatment regimen in patients with HbA1c values consistently >8%.)   Cholesterol, Serum 97   Triglycerides, Serum 116   HDL (INHOUSE)  26   VLDL Cholesterol Calculated 23   LDL Cholesterol Calculated 48 (Result(s) reported on 30 Dec 2011 at 06:58AM.)  Cardiac:  06-Oct-13 15:19    CK, Total 160   CPK-MB, Serum 1.5 (Result(s) reported on 29 Dec 2011 at 04:31PM.)   Troponin I 0.04 (0.00-0.05 0.05  ng/mL or less: NEGATIVE  Repeat testing in 3-6 hrs  if clinically indicated. >0.05 ng/mL: POTENTIAL  MYOCARDIAL INJURY. Repeat  testing in 3-6 hrs if  clinically indicated. NOTE: An increase or decrease  of 30% or more on serial  testing suggests a  clinically important change)    23:27    CK, Total 135   CPK-MB, Serum 0.9 (Result(s) reported on 30 Dec 2011 at 12:01AM.)   Troponin I  0.07 (0.00-0.05 0.05 ng/mL or less: NEGATIVE  Repeat testing in 3-6 hrs  if clinically indicated. >0.05 ng/mL: POTENTIAL  MYOCARDIAL INJURY. Repeat  testing in 3-6 hrs if  clinically indicated. NOTE: An increase or decrease  of 30% or more on serial  testing suggests a  clinically important change)  07-Oct-13 06:17    CK, Total 118   CPK-MB, Serum 0.8 (Result(s) reported on 30 Dec 2011 at 07:03AM.)   Troponin I 0.04 (0.00-0.05 0.05 ng/mL or less: NEGATIVE  Repeat testing in 3-6 hrs  if clinically indicated. >0.05 ng/mL: POTENTIAL  MYOCARDIAL INJURY. Repeat  testing in 3-6 hrs if  clinically indicated. NOTE: An increase or decrease  of 30% or more on serial  testing suggests a  clinically important change)  Routine UA:  06-Oct-13 15:57    Color (UA) Yellow   Clarity (UA) Clear   Glucose (UA) 100 mg/dL   Bilirubin (UA) Negative   Ketones (UA) Negative   Specific Gravity (UA) 1.020   Blood (UA) 1+   pH (UA) 5.0   Protein (UA) 75 mg/dL   Nitrite (UA) Negative   Leukocyte Esterase (UA) Negative (Result(s) reported on 29 Dec 2011 at 04:45PM.)   RBC (UA) 4 /HPF   WBC (UA) 2 /HPF   Bacteria (UA) NONE SEEN   Epithelial Cells (UA) <1 /HPF   Mucous (UA) PRESENT (Result(s) reported on 29 Dec 2011 at 04:45PM.)  Routine Coag:  06-Oct-13 15:19    Prothrombin 13.4   INR 1.0 (INR reference interval applies to patients on anticoagulant therapy. A single INR therapeutic range for coumarins is not optimal for all indications; however, the suggested range for most indications is 2.0 -  3.0. Exceptions to the INR Reference Range may include: Prosthetic heart valves, acute myocardial infarction, prevention of myocardial infarction, and combinations of aspirin and anticoagulant. The need for a higher or lower target INR must be assessed individually. Reference: The Pharmacology and Management of the Vitamin K  antagonists: the seventh ACCP Conference on Antithrombotic and Thrombolytic Therapy. FMBWG.6659 Sept:126 (3suppl): [id number]. A HCT value >55% may artifactually increase the PT.  In one study,  the increase was an average of 25%. Reference:  "Effect on Routine and Special Coagulation Testing Values of Citrate Anticoagulant Adjustment in Patients with High HCT Values." American Journal of Clinical Pathology 2006;126:400-405.)  Routine Hem:  06-Oct-13 15:19    WBC (CBC) 7.6   RBC (CBC) 4.44   Hemoglobin (CBC) 13.8   Hematocrit (CBC)  39.1   Platelet Count (CBC) 201 (Result(s) reported on 29 Dec 2011 at 03:57PM.)   MCV 88   MCH 31.1   MCHC 35.2   RDW 14.2  07-Oct-13 06:17    WBC (CBC) 8.7   RBC (CBC)  4.38   Hemoglobin (CBC) 13.5   Hematocrit (CBC)  38.6   Platelet Count (CBC) 190   MCV 88   MCH 30.8   MCHC 35.0   RDW 14.4   Neutrophil % 81.1   Lymphocyte % 8.8   Monocyte % 9.0   Eosinophil % 0.3   Basophil % 0.8   Neutrophil #  7.1   Lymphocyte #  0.8   Monocyte # 0.8   Eosinophil # 0.0   Basophil # 0.1 (Result(s) reported on 30 Dec 2011 at 06:33AM.)   Radiology Results: MRI:    07-Oct-13 14:11, MRI Brain Without Contrast   MRI Brain Without Contrast    REASON FOR EXAM:    ataxia  COMMENTS:       PROCEDURE: MR  - MR BRAIN WO CONTRAST  -  Dec 30 2011  2:11PM     RESULT:     Technique: Multiplanar and multisequence imaging of the brain was   obtained without the administration of gadolinium.    Findings: The diffusion weighted imaging demonstrates no evidence of   abnormal foci of architectural distortion. There is no evidence of    intra-axial nor extra-axial fluid collections. There is diffuse increased   T2 signal within the subcortical, deep, and periventricular white matter   regions. A focal area of increased T2 signal projects in the medial     aspect of the basal ganglia on the right. There is no evidence of   subfalcine or tonsillar herniation. There is mild diffuse cortical   atrophy. The sella and parasellar regions and structures as well as the   cerebellopontine angle regions and visualized portions of the 7th and 7th   cranial nerves demonstrate no signal abnormalities. The cerebellum and   pons demonstrate no signal abnormalities. Note; the area of lacunar   infarction which appears to project within the medial base of the basal   ganglia alternatively may project anteriorly within the right lobe of the   thalamus.    IMPRESSION:  Chronic and involutional changes without evidence of acute   abnormalities.    Thank you for the opportunity to contribute to the care of your patient.       Verified By: Mikki Santee, M.D., MD  CT:    06-Oct-13 16:18, CT Head Without Contrast   CT Head Without Contrast    REASON FOR EXAM:    weakness dizziness  COMMENTS:       PROCEDURE: CT  - CT HEAD WITHOUT CONTRAST  - Dec 29 2011  4:18PM     RESULT: Comparison:  None    Technique: Multiple axial images from the foramen magnum to the vertex   were obtained without IV contrast.    Findings:      There is no evidence of mass effect, midline shift, or extra-axial fluid   collections.  There is no evidence of a space-occupying lesion or   intracranial hemorrhage. There is no evidence of a cortical-based area of     acute infarction. 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   There is generalized cerebral atrophy. There is periventricular white   matter low attenuation likely secondary to microangiopathy.    The ventricles and sulci are appropriate for the patient's age. The basal    cisterns are patent.    Visualized portions of the orbits are unremarkable. There is mild left   maxillary sinus mucosal thickening. Cerebrovascular atherosclerotic   calcifications are noted.    The osseous structures are unremarkable.    IMPRESSION:      No acute intracranial process.        Dictation Site: 1          Verified By: Jennette Banker, M.D., MD   Electronic Signatures: Anabel Bene (MD)  (Signed 07-Oct-13 18:26)  Authored: REFERRING PHYSICIAN, Primary Care Physician, Consult, History of Present Illness, Review of Systems, PAST MEDICAL/SURGICAL HISTORY, HOME MEDICATIONS, ALLERGIES, NURSING VITAL SIGNS, LAB RESULTS, RADIOLOGY RESULTS   Last Updated: 07-Oct-13 18:26 by Anabel Bene (MD)

## 2014-07-12 NOTE — H&P (Signed)
PATIENT NAME:  Patrick [name], Patrick [name] MR#:  [id number] DATE OF BIRTH:  04-17-1931  DATE OF ADMISSION:  12/29/2011  PRIMARY CARE PHYSICIAN: [place] Pink, MD  ER PHYSICIAN: Arman Filter, MD  CHIEF COMPLAINT: Weakness and dizziness.   HISTORY OF PRESENT ILLNESS: The patient is a 81 year old male with history of hypertension, diabetes, hyperlipidemia, and gastroesophageal reflux disease who came in because he was feeling very weak and dizzy. The patient is actually having the problem weakness and walking very unsteady for two to three weeks. Family went to church and the patient came back home and she was trying to fix his lunch, but the patient never came home. He was trying to get out of the car and could not get up with weakness and the patient's neighbor tried to help him and they recommended him to go to the ER. The patient denies any chest pain, no trouble breathing, no palpitations, no slurred speech, no headache, no nausea or vomiting, felt dizzy, and has been having weakness but right now he feels fine. The patient was evaluated by the ER doctor and the patient was found to be in atrial fibrillation with RVR with heart rate up to 120s. The patient received Cardizem, about 10 mg IV, and he is going in and out of atrial fibrillation during my visit. The patient denies any other complaints. He has been having some cough and low-grade temperature is noted in the emergency room, although the patient denies any fever at home.   PAST MEDICAL HISTORY:  1. Hypertension. 2. Diabetes. 3. High cholesterol. 4. History of gastroesophageal reflux disease.   ALLERGIES: No known drug allergies.   SOCIAL HISTORY: The patient was a previous smoker, quit now. No alcohol. No drugs. Lives with his family.    PAST SURGICAL HISTORY:  1. Fingers on the right hand. 2. Colonoscopy this year, in July, and esophagogastroduodenoscopy. The patient's colonoscopy showed some diverticula in sigmoid colon and polyps in  the descending colon. The patient's EGD showed Barrett's esophagus. Otherwise normal study.  HOME MEDICATIONS: 1. Aspirin 81 mg daily.  2. Cardizem 300 mg extended-release daily.  3. Enalapril 20 mg daily.  4. HCTZ 25 mg daily. 5. Metformin 1 gram p.o. twice a day. 6. Omeprazole 40 mg daily. 7. Pravastatin 40 mg daily.   REVIEW OF SYSTEMS: CONSTITUTIONAL: Denies any fever, fatigue, or weakness. EYES: No blurred vision. ENT: No tinnitus. No ear pain. No epistaxis. No difficulty swallowing. RESPIRATORY: Has been having cough. CARDIOVASCULAR: No chest pain. No orthopnea. The patient does have some pedal edema. GI: Denies nausea, vomiting, or abdominal pain. GU: No dysuria. ENDOCRINE: Has diabetes and referred by primary doctor to Dr. Gabriel Carina. Denies any nocturia. No thyroid problems. No heat or cold intolerance. INTEGUMENT: No skin rashes. MUSCULOSKELETAL: No joint pain. NEUROLOGIC: No numbness or weakness. No history of transient ischemic attacks. No seizures. PSYCH: No anxiety or insomnia.   PHYSICAL EXAMINATION:   VITALS: Temperature initially 100.2, heart rate during my visit was 90, but in between he had an episode of 120 beats per minute, according to the ER doctor. Initial blood pressure 199/90 and repeat 171/83. The patient's blood pressure during my visit was 100/95 and heart rate 105.  GENERAL: Alert, awake, and oriented, answering questions appropriately.   HEAD/EYES: Head atraumatic, normocephalic. Pupils are equal and reacting to light. Extraocular movements intact.   ENT: No tympanic membrane congestion. No turbinate hypertrophy. No oropharyngeal erythema.   NECK: No JVD. No carotid bruit. Normal range of motion. The  patient has no thyroid enlargement and no lymphadenopathy.   LUNGS: Clear to auscultation. No wheeze. No rales.   HEART: S1 and S2 irregularly, irregular. PMI not displaced.   ABDOMEN: Soft, nontender, and nondistended. Bowel sounds present. No hernias. No  organomegaly. The patient has no CVA tenderness.   EXTREMITIES: 1+ pitting edema present up to knees. No cyanosis and no clubbing.   NEUROLOGICAL: Cranial nerves II through XII intact. Power is 5/5 in the upper and lower extremities. Sensation is intact. Deep tendon reflexes 2+ bilaterally.    PSYCH: Mood and affect are within normal limits.   LABS/RADIOLOGIC STUDIES: EKG showed atrial fibrillation with RVR, about beats during my visit.   CAT scan of the head showed no acute process. History of old external capsule lacunar infarct and generalized cerebral atrophy.   Urinalysis: Clear, yellow-colored urine, leukocyte esterase negative, bacteria negative, WBC 7.6, hemoglobin 13.8, hematocrit 39.1, and platelets 201.   Electrolytes: Sodium 140, potassium 3.5, chloride 104, bicarbonate 27, BUN 17, creatinine 1.70, and glucose 144. LFTs are within normal limits. Magnesium 1.4. TSH 0.374. Troponin 0.04. INR 1.   The patient's creatinine last year, in September, was 1.90.   ASSESSMENT AND PLAN:  1. This is a 81 year old male with weakness and dizziness probably secondary to uncontrolled hypertension and also atrial fibrillation with rapid ventricular response. The patient is already on high dose Cardizem at home. We will add small dose beta blockers and also get free T4 and TSH to evaluate for hypothyroidism. Blood pressure medications are restarted which include his HCTZ and enalapril. Monitor him on telemetry and see how he responds. Start the patient on full dose anticoagulation with Lovenox and discuss with cardiologist in the morning, Dr. Kathlyn Sacramento, for consultation for atrial fibrillation with rapid ventricular response. Obtain echocardiogram for atrial fibrillation.  2. Chronic kidney disease. Baseline is about 1.9, so there is not much change, so we will continue lisinopril and HCTZ as well.  3. Diabetes mellitus, type II. Continue metformin along with fingersticks before meals and at  bedtime.  4. Hyperlipidemia. Continue statins.  5. Barrett's esophagus. Continue PPIs.  6. Weakness and dizziness. The patient is unstable. So we will get physical therapy evaluation and see what they say. The patient is going to be admitted to telemetry.   TIME SPENT: About 60 minutes.  ____________________________ Epifanio Lesches, MD sk:slb D: 12/29/2011 19:32:48 ET T: 12/30/2011 07:22:09 ET JOB#: [id number]  cc: Epifanio Lesches, MD, <Dictator> Irven Easterly. Kary Kos, MD Epifanio Lesches MD ELECTRONICALLY SIGNED 01/23/2012 8:37

## 2014-07-12 NOTE — Consult Note (Signed)
General Aspect 81 year old male with history of hypertension, diabetes, hyperlipidemia, and gastroesophageal reflux disease who came in to the hospital because he was feeling very weak and dizzy. Cardiology was consulted for atrial fibrillation.  The family reports he has been weak with  unsteady gait, particularly yesterday. Some SOB, fever, SOB last thursday.   The day of admission, the family went to church and the patient came back home and she was trying to fix his lunch, but the patient never came home. He was trying to get out of the car and could not get up secondary to weakness. The patient's neighbor tried to help him and they recommended him to go to the ER.   The patient denies any chest pain, no trouble breathing, no palpitations, no slurred speech, no headache, no nausea or vomiting. He felt dizzy, and has been having weakness. The patient was evaluated by the ER doctor and the patient was found to be in atrial fibrillation with RVR with heart rate up to 120s. The patient received Diltiazem 10 mg IV. He had cough and low-grade temperature  noted in the emergency room.  Family reports the main changes with him include gait instability (leaning forward), weaklness, cough and fever.    Present Illness . ALLERGIES: No known drug allergies.   SOCIAL HISTORY: The patient was a previous smoker, quit now. No alcohol. No drugs. Lives with his family.   Family hx: NO cardiac dx reported  PAST SURGICAL HISTORY:  1. Fingers on the right hand. 2. Colonoscopy this year, in July, and esophagogastroduodenoscopy. The patient's colonoscopy showed some diverticula in sigmoid colon and polyps in the descending colon. The patient's EGD showed Barrett???s esophagus. Otherwise normal study.   Physical Exam:   GEN well developed, well nourished, no acute distress    HEENT red conjunctivae    NECK supple  No masses    RESP normal resp effort  rhonchi  left base    CARD Irregular rate and  rhythm  No murmur    ABD denies tenderness  soft    EXTR negative edema    SKIN normal to palpation    NEURO motor/sensory function intact    PSYCH alert, A+O to time, place, person   Review of Systems:   Subjective/Chief Complaint cough, weak    General: Weakness    Skin: No Complaints    ENT: No Complaints    Eyes: No Complaints    Neck: No Complaints    Respiratory: No Complaints    Cardiovascular: No Complaints    Gastrointestinal: No Complaints    Genitourinary: No Complaints    Vascular: No Complaints    Musculoskeletal: No Complaints    Neurologic: No Complaints    Hematologic: No Complaints    Endocrine: No Complaints    Psychiatric: No Complaints    Review of Systems: All other systems were reviewed and found to be negative    Medications/Allergies Reviewed Medications/Allergies reviewed     Diabetes:    Right Finger Amputation:        Admit Diagnosis:   AFIB WITH RVR WEAKNESS UNSTEADYGAIT: 30-Dec-2011, Active, AFIB WITH RVR WEAKNESS UNSTEADYGAIT      Chronic Problem:   Tobacco abuse: (305.1) Active, ICD9, Tobacco use disorder  Home Medications: Medication Instructions Status  hydrochlorothiazide 25 mg oral tablet 1 tab(s) orally once a day Active  diltiazem 300 mg/24 hours oral capsule, extended release 1 cap(s) orally once a day Active  aspirin 81 mg oral tablet 1  tab(s) orally once a day Active  omeprazole 40 mg oral delayed release capsule 1 cap(s) orally once a day Active  enalapril 20 mg oral tablet 1 tab(s) orally once a day Active  metformin 1000 mg oral tablet 1 tab(s) orally 2 times a day Active  pravastatin 40 mg oral tablet 1 tab(s) orally once a day (at bedtime) Active   Lab Results:  Thyroid:  06-Oct-13 15:19    Thyroxine, Free 0.98 (Result(s) reported on 29 Dec 2011 at 08:21PM.)   Thyroid Stimulating Hormone  0.374 (0.45-4.50 (International Unit)  ----------------------- Pregnant patients have  different reference   ranges for TSH:  - - - - - - - - - -  Pregnant, first trimetser:  0.36 - 2.50 uIU/mL)  Hepatic:  06-Oct-13 15:19    Bilirubin, Total 0.8   Alkaline Phosphatase 79   SGPT (ALT) 21   SGOT (AST) 21   Total Protein, Serum 7.2   Albumin, Serum  3.2  Routine Chem:  06-Oct-13 15:19    Glucose, Serum  144   BUN 17   Creatinine (comp)  1.70   Sodium, Serum 140   Potassium, Serum 3.5   Chloride, Serum 104   CO2, Serum 27   Calcium (Total), Serum  8.4   Anion Gap 9   Osmolality (calc) 283   eGFR (African American)  44   eGFR (Non-African American)  38 (eGFR values <8m/min/1.73 m2 may be an indication of chronic kidney disease (CKD). Calculated eGFR is useful in patients with stable renal function. The eGFR calculation will not be reliable in acutely ill patients when serum creatinine is changing rapidly. It is not useful in  patients on dialysis. The eGFR calculation may not be applicable to patients at the low and high extremes of body sizes, pregnant women, and vegetarians.)   Magnesium, Serum  1.4 (1.8-2.4 THERAPEUTIC RANGE: 4-7 mg/dL TOXIC: > 10 mg/dL  -----------------------)  Cardiac:  06-Oct-13 15:19    CK, Total 160   CPK-MB, Serum 1.5 (Result(s) reported on 29 Dec 2011 at 04:31PM.)   Troponin I 0.04 (0.00-0.05 0.05 ng/mL or less: NEGATIVE  Repeat testing in 3-6 hrs  if clinically indicated. >0.05 ng/mL: POTENTIAL  MYOCARDIAL INJURY. Repeat  testing in 3-6 hrs if  clinically indicated. NOTE: An increase or decrease  of 30% or more on serial  testing suggests a  clinically important change)  Routine Coag:  06-Oct-13 15:19    Prothrombin 13.4   INR 1.0 (INR reference interval applies to patients on anticoagulant therapy. A single INR therapeutic range for coumarins is not optimal for all indications; however, the suggested range for most indications is 2.0 - 3.0. Exceptions to the INR Reference Range may include: Prosthetic heart valves, acute myocardial  infarction, prevention of myocardial infarction, and combinations of aspirin and anticoagulant. The need for a higher or lower target INR must be assessed individually. Reference: The Pharmacology and Management of the Vitamin K  antagonists: the seventh ACCP Conference on Antithrombotic and Thrombolytic Therapy. CGQBVQ.9450Sept:126 (3suppl): [id number] A HCT value >55% may artifactually increase the PT.  In one study,  the increase was an average of 25%. Reference:  "Effect on Routine and Special Coagulation Testing Values of Citrate Anticoagulant Adjustment in Patients with High HCT Values." American Journal of Clinical Pathology 2006;126:400-405.)  Routine Hem:  06-Oct-13 15:19    WBC (CBC) 7.6   RBC (CBC) 4.44   Hemoglobin (CBC) 13.8   Hematocrit (CBC)  39.1   Platelet Count (CBC)  201 (Result(s) reported on 29 Dec 2011 at 03:57PM.)   MCV 88   MCH 31.1   MCHC 35.2   RDW 14.2   EKG:   Interpretation EKG shows atrial fibrillation with rate 106 bpm, nonspecific ST ABn    No Known Allergies:   Vital Signs/Nurse's Notes: **Vital Signs.:   07-Oct-13 15:18   Vital Signs Type Routine   Temperature Temperature (F) 99.2   Celsius 37.3   Temperature Source Oral   Pulse Pulse 72   Respirations Respirations 18   Systolic BP Systolic BP 492   Diastolic BP (mmHg) Diastolic BP (mmHg) 82   Mean BP 106   Pulse Ox % Pulse Ox % 94   Pulse Ox Activity Level  At rest   Oxygen Delivery 4L     Impression 81 year old male with history of hypertension, diabetes, hyperlipidemia, and gastroesophageal reflux disease who came in to the hospital because he was feeling very weak and dizzy. Cardiology was consulted for atrial fibrillation.  1) Atrial fibrillation: Rate well controlled  He did receive double dose of cardizem last night and this AM (300 mg x 2), also started on metoprolol 50 BID. Rate is slow this evening (low 60s) --Will decrease metoprolol dose for tonight and place hold  parameters. -Hold lovenox, start xarelto renal dose 15 mg daily --Will likely need outpt cardioversion if still in atrial fin in 4 weeks  2) Cough/fever,  CXR with LLL infiltrate concerning for bronchitis Dr. Trena Platt note wanting levaquin Will start ABX tonight  3) gait instability: secondary to dehydration, possible bronchitis Less likely atrial fib though unable to definatively exclude atrial fib as cause --Will likely need hydration (possibly just PO), ABX, if no improvement, will need rehab  4) HTN: Will monitor for now. Cardizem x 2 doses given last night and this AM, metoprolol added to other meds.   Electronic Signatures: Ida Rogue (MD)  (Signed 07-Oct-13 19:04)  Authored: General Aspect/Present Illness, History and Physical Exam, Review of System, Past Medical History, Health Issues, Home Medications, Labs, EKG , Allergies, Vital Signs/Nurse's Notes, Impression/Plan   Last Updated: 07-Oct-13 19:04 by Ida Rogue (MD)

## 2014-07-15 NOTE — Discharge Summary (Signed)
PATIENT NAME:  Patrick [name], Patrick [name] MR#:  [id number] DATE OF BIRTH:  [birth year]  DATE OF ADMISSION:  11/29/2012 DATE OF DISCHARGE:  12/01/2012  DISCHARGE DIAGNOSES: 1. Acute on chronic systolic heart failure secondary to dietary noncompliance with high salt intake. 2.  Chronic atrial fibrillation.   3.  Chronic kidney disease stage III. 4.  Diabetes mellitus type 2.  5.  Obesity.  DISCHARGE MEDICATIONS:  Omeprazole 40 mg p.o. daily, Xarelto 15 mg p.o. daily, pravastatin 40 mg 2 tablets daily, torsemide 20 mg p.o. b.i.d., Humalog  Mix 50-50, 70 units b.i.d.; aspirin 81 mg daily, glipizide XL 10 mg p.o. b.i.d., hydralazine 100 mg p.o. t.i.d., Coreg 3.125 mg p.o. b.i.d., KCl  20 mEq p.o. daily, metoprolol  50 mg  p.o. b.i.d., iron 65 mg p.o. b.i.d.   CONSULTATIONS: Cardiology consult with Dr. [name].  The patient was discharged home with home physical therapy and also home nurse for CHF education.   HOSPITAL COURSE: A 81 year old male patient with history of congestive heart failure, admitted because of shortness of breath.  The patient also had some pedal edema on admission. The patient's chest x-ray was consistent with some pulmonary edema. BNP was 4711, troponins have been negative. The patient was started on IV Lasix, admitted to telemetry, continued on oxygen, continued on Coreg and enalapril and Xarelto. The patient was seen by Dr. [name]. Troponins were slightly up at 0.06, . Echocardiogram showed EF of 60% to 65% with LVH, normal systolic function. Seen by cardiology and recommended  to keep in hospital for a day, which we did.  The patient diuresed well and trouble breathing improved. The patient was discharged home with torsemide and had a nurse for CHF education, and he also was weak, so we had to have a physical therapy evaluation and discharged home with home physical therapy. The patient has a history of dietary noncompliance with consuming a lot of salt and fluid as well.  The patient's  baseline weight is 220 pounds, and when he came in it was 227 pounds. The patient advised to be strict about his diet with less than 2 grams of sodium and less than 2 liters of fluid, and the patient followed up with Dr. [name]. The patient will have further testing as an outpatient. Slight elevation of troponins thought to be secondary to CHF rather than ischemia.  2.  Persistent Afib,  rate controlled.  The patient is on Xarelto and metoprolol. Continue.  3.  Chronic kidney disease stage III, follows up with Dr. Juleen China and stable at 1.8.  The patient is on diuretics and ACE inhibitors.    4.  High blood pressure, stable with Coreg and torsemide and enalapril and hydralazine.   The patient was discharged home in stable condition. Arranged home physical therapy and R.N. services.   TIME SPENT: More than 30 minutes.    ____________________________ Epifanio Lesches, MD sk:dmm D: 12/03/2012 21:36:00 ET T: 12/03/2012 22:14:36 ET JOB#: [id number]  cc: Minna Merritts, MD Irven Easterly. Kary Kos, MD Epifanio Lesches, MD, <Dictator> Epifanio Lesches MD ELECTRONICALLY SIGNED 12/16/2012 22:16

## 2014-07-15 NOTE — H&P (Signed)
PATIENT NAME:  Patrick [name], Patrick [name] MR#:  [id number] DATE OF BIRTH:  Jan 22, 1932  DATE OF ADMISSION:  11/29/2012  PRIMARY CARE PHYSICIAN:  Dr. Kary Kos  REFERRING PHYSICIAN: Dr. Jimmye Norman  CHIEF COMPLAINT: Shortness of breath.   HISTORY OF PRESENT ILLNESS: A 81 year old Caucasian male with a history of AFib, hypertension, diabetes, high cholesterol, GERD, Bell's palsy. Presented in the ED with shortness of breath today. The patient was fine until today when patient went to [employer], suddenly felt shortness of breath, but the patient denies any cough, sputum or hematemesis. No chest pain, palpitation, orthopnea, or nocturnal dyspnea. The patient denies any weight gain or leg edema, but he admitted that he drinks a lot of ice water recently. Has some stress from home.   PAST MEDICAL HISTORY: Hypertension, diabetes, AFib, on Xarelto, high cholesterol, GERD, Bell's palsy.   SURGICAL HISTORY:  Right hand surgery.   ALLERGIES:  None.   SOCIAL HISTORY:  Quit smoking a long time ago. No alcohol drinking or illicit drugs.  FAMILY HISTORY:  Diabetes, hypertension, but no heart attack or stroke.   HOME MEDICATIONS:  Aspirin 81 mg p.o. daily, enalapril 20 mg p.o. daily, glipizide 10 mg p.o. b.i.d., Humalog mix 50/50, 70 units subcu b.i.d., hydralazine 100 mg p.o. t.i.d., omeprazole 40 mg p.o. daily, pravastatin 40 mg p.o. 2 tabs once a day at bedtime, Xarelto 15 mg p.o. once a day, torsemide 20 mg p.o. b.i.d.    REVIEW OF SYSTEMS: CONSTITUTIONAL: The patient denies any fever or chills. No headache or dizziness, but has generalized weakness.  EYES: No double vision or blurry vision. EARS, NOSE, THROAT: No postnasal drip, slurred speech or dysphagia, but has Bell's palsy, which was diagnosed 2 months ago.  CARDIOVASCULAR: No chest pain, palpitation, orthopnea, nocturnal dyspnea. No leg edema.  PULMONARY: No cough, sputum, but has shortness of breath. No hemoptysis.  GASTROINTESTINAL: No abdominal pain,  nausea, vomiting or diarrhea. No melena or bloody stool.  GENITOURINARY: No dysuria, hematuria or incontinence.  SKIN: No rash or jaundice.  NEUROLOGY: No syncope, loss of consciousness, or seizure.  HEMATOLOGY: No easy bruising or bleeding.  ENDOCRINE: No polyuria, polydipsia, heat or cold intolerance.   VITAL SIGNS: Temperature 98.3, blood pressure 142/70, pulse 76, respirations 22, O2 saturation 95% in room air.   PHYSICAL EXAMINATION: GENERAL: The patient is alert, awake, oriented, in no acute distress.  HEENT: Pupils round, equal, react to light and accommodation. Neck is supple. No JVD or carotid bruit. No lymphadenopathy. No thyromegaly. Moist oral mucosa. Clear pharynx. Left side Bell's palsy.  CARDIOVASCULAR: S1, S2. Irregular rate and rhythm. No murmur or gallops.  PULMONARY: Bilateral air entry with recent mild rales bilateral. No use of accessory muscle to breathe.  ABDOMEN: Soft, obese. Bowel sounds present. No distention, no tenderness, no organomegaly.  EXTREMITIES: Bilateral leg edema, but no clubbing or cyanosis. No calf tenderness. Bilateral pedal pulses present.  SKIN: No rash or jaundice.  NEUROLOGY: Alert and oriented x 3. No focal deficit. Power 5/5. Sensation intact.   LABORATORY DATA:  ABG showed a pH of 7.46 pCO2 of 39. Troponin 0.05. WBC 12.2, hemoglobin 13.1, platelets 242. Glucose 188, BUN 16, creatinine 1.67, sodium 141, potassium 3.3, chloride 105. BNP 4711. CK 111. Chest x-ray shows mild cardiomyopathy with pulmonary edema suggestive of congestive heart failure. EKG showed AFib with PVCs at 70 BPM.  IMPRESSIONS: 1.  New onset congestive heart failure.  2.  Atrial fibrillation, controlled.  3.  Possible acute renal failure.  4.  Hyponatremia.  5.  Hypokalemia.  6.  Hypertension.  7.  Diabetes.  8.  Hyperlipidemia.  9.  Obesity.   PLAN OF TREATMENT:   1.  The patient will be admitted to telemetry floor. We will continue O2 by nasal cannula. We will start  Lasix 40 mg IV q. 12 hours. We will add Coreg and continue enalapril, statin, and continue Xarelto. Get echocardiogram and Cardiology consult from Dr. [name].   2.  We will give potassium and follow BMP and magnesium level.   3.  For diabetes, we will start a sliding scale. Continue Humalog, but hold glipizide.   Discussed patient's condition and plan of treatment with the patient, patient's wife and son. The patient wants FULL CODE.   TIME SPENT: About 65 minutes.    ____________________________ Demetrios Loll, MD qc:mr D: 11/29/2012 18:50:09 ET T: 11/29/2012 19:32:59 ET JOB#: [id number]  cc: Demetrios Loll, MD, <Dictator> Demetrios Loll MD ELECTRONICALLY SIGNED 11/30/2012 10:58

## 2014-07-15 NOTE — Consult Note (Signed)
General Aspect Patrick [name] is a 81yo male w/ PMHx s/f persistent/permanent atrial fibrillation, HFpEF, CKD (stage III), uncontrolled DM2, HTN, HLD, GERD and Bell's palsy who was admitted to [facility] yesterday for decompensated CHF.   He was seen in consultation for newly diagnosed a-fib with RVR 12/2011. This was rate-controlled on metorpolol and diltiazem. Xarelto 15mg  PO was started for anticoagulation. Echo at that time indicated EF 50-55%, mild LVH, mild LA dilatation. Torsemide was started earlier this year given 14 lbs weight gain and LE edema. Edema persisted and diltiazem was held. Metoprolol was increased for rate-control as he had remained in a-fib. He has declined DCCV previously. He last followed up with Dr. [name] 4 days ago. Weight had been stable ~ 220 lbs. BP elevated. ACEi was added. His nephrologst Dr. [name] recommended hem/onc consult for unclear reasons. The patient is on iron. The recommendation was made to add extra torsemide in the AM if edema worsened.  Since then, he reports experiencing progressive DOE and PND. LE edema stable. He denies orthopnea. He does not weigh himself daily. BP has been well-controlled at home per wife. HR 70-80s. Denies chest pain, palpitations or syncope. No fevers or chills. Wife reports the patient does snore at night. No prior sleep study. Denies EtOH, tobacco or elicit drug use. He continues to take torsemide and rivaroxaban as prescribed.   Present Illness In the ED, EKG revealed atrial fibrillation, HR 80s, trace PVC. Initial trop-I WNL. BNP elevated at 4711. BUN 16/Cr 1.67 (Cr 2.2 in 12/2011, 2.98 in 07/2012). CXR indicated mild cardiomegaly and pulmonary edema. No evidence of consolidation, pneumonitis felt to be less likely. He was admitted by the medicine service and started on Lasix 40mg  IV BID. UOP thus far: - 1075 mL. TSH returned normal. A subsequent troponin returned mildly elevated at 0.06. LDL 46, HDL 24, TG 140, TC 98. Cardiology has been  consulted for CHF. Echo pending.   PAST MEDICAL HISTORY: Hypertension, diabetes, AFib, on Xarelto, high cholesterol, GERD, Bell???s palsy, h/o R hand trauma/finger amputation   SURGICAL HISTORY:  Right hand surgery.   ALLERGIES:  None.   SOCIAL HISTORY:  Quit smoking 45 years ago. No alcohol drinking or illicit drugs. Used to work in a [employer] until a work accident left him disabled ([name]).  FAMILY HISTORY:  Diabetes, hypertension, but no heart attack or stroke.   Physical Exam:  GEN no acute distress, obese   HEENT pink conjunctivae, PERRL, hearing intact to voice, left facial droop   NECK supple  No masses  trachea midline  JVP 7 cm   RESP normal resp effort  no use of accessory muscles  trace bibasilar rales, no wheezes or rhonchi   CARD Irregular rate and rhythm  Normal, S1, S2  No murmur   ABD denies tenderness  soft  normal BS   EXTR negative cyanosis/clubbing, trace-1+ bilateral pitting pretibial edema   SKIN normal to palpation, No rashes   NEURO follows commands, motor/sensory function intact, left facial droop   PSYCH alert, A+O to time, place, person   Review of Systems:  Subjective/Chief Complaint shortness of breath   General: Fatigue   Respiratory: Short of breath  + PND   Cardiovascular: Dyspnea   Gastrointestinal: No Complaints   Review of Systems: All other systems were reviewed and found to be negative   Home Medications: Medication Instructions Status  omeprazole 40 mg oral delayed release capsule 1 cap(s) orally once a day Active  rivaroxaban 15  mg oral tablet 1 tab(s) orally once a day Active  pravastatin 40 mg oral tablet 2 tabs (80mg ) orally once a day (at bedtime). Active  torsemide 20 mg oral tablet 1 tab(s) orally 2 times a day, As Needed Active  HumaLOG Mix 50/50 50 units-50 units/mL subcutaneous suspension 70 unit(s) subcutaneous 2 times a day Active  aspirin 81 mg oral delayed release tablet 1 tab(s) orally once a day  Active  enalapril 20 mg oral tablet 1 tab(s) orally once a day Active  glipiZIDE extended release 10 mg oral tablet, extended release 1 tab(s) orally 2 times a day (before meals) Active  hydrALAZINE 100 mg oral tablet 1 tab(s) orally 3 times a day Active   Lab Results:  Routine Chem:  07-Sep-14 17:10   B-Type Natriuretic Peptide [facility])  4711 (Result(s) reported on 29 Nov 2012 at 05:53PM.)  08-Sep-14 03:06   Hemoglobin A1c ([facility])  9.0 (The American Diabetes Association recommends that a primary goal of therapy should be <7% and that physicians should reevaluate the treatment regimen in patients with HbA1c values consistently >8%.)  Cardiac:  07-Sep-14 17:10   Troponin I 0.05 (0.00-0.05 0.05 ng/mL or less: NEGATIVE  Repeat testing in 3-6 hrs  if clinically indicated. >0.05 ng/mL: POTENTIAL  MYOCARDIAL INJURY. Repeat  testing in 3-6 hrs if  clinically indicated. NOTE: An increase or decrease  of 30% or more on serial  testing suggests a  clinically important change)  CK, Total 111  CPK-MB, Serum 1.4 (Result(s) reported on 29 Nov 2012 at 05:53PM.)  08-Sep-14 03:06   Troponin I  0.06 (0.00-0.05 0.05 ng/mL or less: NEGATIVE  Repeat testing in 3-6 hrs  if clinically indicated. >0.05 ng/mL: POTENTIAL  MYOCARDIAL INJURY. Repeat  testing in 3-6 hrs if  clinically indicated. NOTE: An increase or decrease  of 30% or more on serial  testing suggests a  clinically important change)  Routine UA:  08-Sep-14 06:58   Epithelial Cells (UA) <1 /HPF   EKG:  Interpretation atrial fibrillation, trace PVC, no ST/T change   Rate 80    No Known Allergies:   Vital Signs/Nurse's Notes: **Vital Signs.:   08-Sep-14 08:06  Vital Signs Type Routine  Temperature Temperature (F) 98.2  Celsius 36.7  Temperature Source oral  Pulse Pulse 75  Pulse source if not from Vital Sign Device per cardiac monitor  Respirations Respirations 18  Systolic BP Systolic BP [id number]  Diastolic BP (mmHg)  Diastolic BP (mmHg) 76  Mean BP 103  Pulse Ox % Pulse Ox % 95  Pulse Ox Activity Level  At rest  Oxygen Delivery 2L    Impression 81yo male w/ PMHx s/f persistent/permanent atrial fibrillation, HFpEF, CKD (stage III), uncontrolled DM2, HTN, HLD, GERD and Bell's palsy who was admitted to [facility] yesterday for decompensated CHF.   1. HFpEF The patient has a history of chronic diastolic CHF, likely secondary to HTN, DM2. EF 50-55%, mild LVH, mild LA dilatation in 12/2011. He was clinically stable on follow-up 4 days ago. Since then, he has developed progessive DOE and PND. He reports dietary indiscretion- consumes a significant amount of salt and fluid. No chest pain, fevers, chills.  A-fib has been rate-controlled. SBP 150-160s. Admission weight 227 lbs (baseline ~ 220 lbs). Started on Lasix IV yesterday. Total I/O - 1075 mL. Weight down 7 lbs. + JVD and bibasilar rales on exam. Suspect the patient's decompensation is related to dietary indiscretion.       -- Give Lasix 40mg  IV x  1, then switch back to home PO torsemide regimen      -- Review 2D echo      -- He will need CHF education to include- < 2 g/day sodium restriction, < 2 L/day fluid restriction, daily weights, compression stockings, leg elevation      -- Treat hypertension      -- Recommend outpatient sleep study to eval for underlying OSA      -- Check one more troponin to monitor trend. Suspect mild elevation is due to CHF over ischemia. If DOE persists, consider outpatient stress testing.  2. Persistent atrial fibrillation Remains rate-controlled on metoprolol. No bleeding on Xarelto. Tolerates atrial fibrillation well. Should this lead to further CHF decompensation or become symptomatic, consider AAD and attempt at [facility].  3. CKD, stage III Actually improved from previous admissions.       -- Continue to monitor renal function with diuretic, ACEi therapy      -- Continued follow-up with nephrology  4. HTN Goal < 140/90. Running  150-160/70-80s.       -- Increase carvedilol to 6.25 mg BID   Plan 5. Type 2 DM Hgb A1C 9.0% indicating poor control. Suspect dietary indiscretion contributing. Metformin discontinued d/t renal function. Consider adding second oral agent or starting long acting insulin.   6. Hyperlipidemia      -- Continue statin   Electronic Signatures for Addendum Section:  Kathlyn Sacramento (MD) (Signed Addendum 08-Sep-14 13:24)  The patient was seen and examined. Agree with the above. He presented with acute on chronic diastolic heart failure. He is still mildly fluid overloaded. No chest pain. Echo showed normal LVSF. I doubt that his A-fib is the cause of decompensation given that he had this since last year.  Continue diuresis. Continue rate control and anticoagulation for A-fib. Consider an outpatient sleep study. Needs heart failure education and low sodium diet.   Electronic Signatures: Meriel Pica (PA-C)  (Signed 08-Sep-14 10:15)  Authored: General Aspect/Present Illness, History and Physical Exam, Review of System, Home Medications, Labs, EKG , Allergies, Vital Signs/Nurse's Notes, Impression/Plan Kathlyn Sacramento (MD)  (Signed 08-Sep-14 13:24)  Co-Signer: General Aspect/Present Illness, Home Medications, Allergies, Impression/Plan   Last Updated: 08-Sep-14 13:24 by Kathlyn Sacramento (MD)

## 2014-07-16 NOTE — Consult Note (Signed)
General Aspect PCP: Irish Lack, MD Primary Cardiologist: Johnny Bridge, MD  _____________  Pt Profile: 81 y/o male with a h/o permanent afib, CKD III, HTN, and chronic diastolic chf, who was admitted with dyspnea and CHF. _____________  Past Medical History ??? Hypertension  ??? Diabetes mellitus without complication  ??? Hyperlipidemia  ??? History of gastroesophageal reflux (GERD)  ??? CKD (chronic kidney disease), stage III  ??? Permanent atrial fibrillation    a. Dx 12/2011, Rate-controlled, chronic Xarelto (renal dosing). ??? Bell's palsy  ??? Chronic diastolic CHF (congestive heart failure)    a. 11/2012 Echo: EF 60-65%, mod conc LVH, mildly dil LA/RA, mild Ao sclerosis w/o stenosis. ??? Anemia of chronic disease                              a. Eval by oncology 11/2012 with plan to initate procrit when Hgb < 10.  Past Surgical History ??? Finger surgery     right hand ??? Colonoscopy  09/2011 ??? Esophagogastroduodenoscopy   ??? Appendectomy    _____________   Family History ??? Heart attack Brother  ??? Hypertension   _____________   Social History ??? Marital Status: Married   Number of Children: N/A ??? Years of Education: N/A  ??? Smoking status: Former Smoker -- 0.25 packs/day for 10 years   Types: Cigars   Quit date: 05/06/1970 ??? Smokeless tobacco: Not on file ??? Alcohol Use: No ??? Drug Use: No  Social History Narrative  Lives in [place] with his wife. _____________   Present Illness 81 y/o male with the above complex problem list.  He has a h/o permanent afib dating back to 12/2011 and has been on renal-dosed xarelto since.  He has been well rate-controlled using metoprolol.  He was previously on diltiazem but did not tolerate it 2/2 worsening lower extremity swelling.  He also has a h/o diast CHF with echo in 11/2012 showing nl LV fxn.  He has a h/o CKD III and is followed by nephrology as an outpt.  His wife indicates that his torsemide dose was cut  back recently to 5 days/wk in the setting of rising creatinine.  Pt weighs himself daily and says that he has been running between 215-218, which is normal for him.  Beginning on Friday, he began to note some DOE, which worsened on Saturday and was worse still on Sunday morning.  His wt hasn't changed and he denies any change to his chronic 2 pillow orthopnea, n, v, dizziness, sycope, edema, early satiety, pnd, or chest pain.  He was very SOB when he was getting ready for church tomorrow and while @ church, his wife asked a local paramedic to check on him.  It was felt that he required hospitalization and he was transported to the ED.  There, ECG was non-acute.  Trop was nl.  BNP was elevated @ 2708.  CXR showed mild CHF with bilat pleural effusions.  He was hypertensive with systolics in the 765'Y.  H/H were 8.1/26.9 respectively (normocytic/hypochromic).  He was admitted for further eval and placed on IV lasix.  Since admission, breathing has improved some but he remains dyspneic with minimal activity (sitting up in bed).   Physical Exam:  GEN thin, pleasant, nad.   HEENT pink conjunctivae, moist oral mucosa   NECK supple  no bruits. jvd to jaw.   RESP normal resp effort  bibasilar crackles.  mildly diminished breath sounds bilat.  CARD Regular rate and rhythm  Normal, S1, S2   ABD denies tenderness  soft  normal BS   EXTR negative cyanosis/clubbing, negative edema   SKIN normal to palpation   NEURO cranial nerves intact, motor/sensory function intact   PSYCH alert, A+O to time, place, person   Review of Systems:  General: Weakness   Skin: No Complaints   ENT: No Complaints   Eyes: No Complaints   Neck: No Complaints   Respiratory: Short of breath   Cardiovascular: Dyspnea  chronic 2 pillow orthopnea.   Gastrointestinal: No Complaints   Genitourinary: polyuria.   Vascular: No Complaints   Musculoskeletal: No Complaints   Neurologic: No Complaints   Hematologic:  No Complaints   Endocrine: No Complaints   Psychiatric: No Complaints   Review of Systems: All other systems were reviewed and found to be negative   Medications/Allergies Reviewed Medications/Allergies reviewed   Home Medications: Medication Instructions Status  Ultram tablet 50 mg 1 tab(s) orally every 4 hours as needed  Active  carvedilol 3.125 mg oral tablet 1 tab(s) orally 2 times a day Active  potassium chloride 20 mEq oral tablet, extended release 1 tab(s) orally once a day Active  omeprazole 40 mg oral delayed release capsule 1 cap(s) orally once a day Active  rivaroxaban 15 mg oral tablet 1 tab(s) orally once a day Active  pravastatin 40 mg oral tablet 2 tabs (46m) orally once a day (at bedtime). Active  torsemide 20 mg oral tablet 1 tab(s) orally 2 times a day, As Needed Active  HumaLOG Mix 50/50 50 units-50 units/mL subcutaneous suspension 70 unit(s) subcutaneous 2 times a day Active  aspirin 81 mg oral delayed release tablet 1 tab(s) orally once a day Active  glipiZIDE extended release 10 mg oral tablet, extended release 1 tab(s) orally 2 times a day (before meals) Active  hydrALAZINE 100 mg oral tablet 1 tab(s) orally 3 times a day Active  metoprolol tartrate 50 mg oral tablet 1.5 tab(s) orally 2 times a day Active  Iron 670m1 tab(s) orally 2 times a day Active   Lab Results:  Thyroid:  12-Oct-15 04:33   Thyroid Stimulating Hormone 0.851 (0.45-4.50 (IU = International Unit)  ----------------------- Pregnant patients have  different reference  ranges for TSH:  - - - - - - - - - -  Pregnant, first trimetser:  0.36 - 2.50 uIU/mL)  Hepatic:  12-Oct-15 04:33   Bilirubin, Total 0.7  Alkaline Phosphatase 75 (46-116 NOTE: New Reference Range 10/12/13)  SGPT (ALT)  12 (14-63 NOTE: New Reference Range 10/12/13)  SGOT (AST)  13  Total Protein, Serum 6.4  Albumin, Serum  2.6  Routine Chem:  12-Oct-15 04:33   Ferritin ([facility]) 48 (Result(s) reported on 03 Jan 2014  at 05:53AM.)  Iron Binding Capacity (TIBC) 339  Unbound Iron Binding Capacity 306  Iron, Serum  33  Iron Saturation 10 (Result(s) reported on 03 Jan 2014 at 0581:85UD  Folic Acid, Serum 5.4 (Result(s) reported on 03 Jan 2014 at 05:53AM.)  LDH, Serum 154 (Result(s) reported on 03 Jan 2014 at 05:53AM.)  Cholesterol, Serum 81  Triglycerides, Serum 111  HDL (INHOUSE)  16  VLDL Cholesterol Calculated 22  LDL Cholesterol Calculated 43 (Result(s) reported on 03 Jan 2014 at 05:53AM.)  Glucose, Serum  106  BUN 16  Creatinine (comp)  2.02  Sodium, Serum 144  Potassium, Serum 3.5  Chloride, Serum  108  CO2, Serum 29  Calcium (Total), Serum  8.1  Osmolality (calc)  288  eGFR (African American)  41  eGFR (Non-African American)  34 (eGFR values <44m/min/1.73 m2 may be an indication of chronic kidney disease (CKD). Calculated eGFR, using the MRDR Study equation, is useful in  patients with stable renal function. The eGFR calculation will not be reliable in acutely ill patients when serum creatinine is changing rapidly. It is not useful in patients on dialysis. The eGFR calculation may not be applicable to patients at the low and high extremes of body sizes, pregnant women, and vetetarians.)  Anion Gap 7  B-Type Natriuretic Peptide ([facility]  2853 (Result(s) reported on 03 Jan 2014 at 05:54AM.)  Cardiac:  11-Oct-15 13:07   Troponin I 0.02 (0.00-0.05 0.05 ng/mL or less: NEGATIVE  Repeat testing in 3-6 hrs  if clinically indicated. >0.05 ng/mL: POTENTIAL  MYOCARDIAL INJURY. Repeat  testing in 3-6 hrs if  clinically indicated. NOTE: An increase or decrease  of 30% or more on serial  testing suggests a  clinically important change)    17:41   Troponin I 0.03 (0.00-0.05 0.05 ng/mL or less: NEGATIVE  Repeat testing in 3-6 hrs  if clinically indicated. >0.05 ng/mL: POTENTIAL  MYOCARDIAL INJURY. Repeat  testing in 3-6 hrs if  clinically indicated. NOTE: An increase or decrease  of 30%  or more on serial  testing suggests a  clinically important change)    21:20   Troponin I 0.02 (0.00-0.05 0.05 ng/mL or less: NEGATIVE  Repeat testing in 3-6 hrs  if clinically indicated. >0.05 ng/mL: POTENTIAL  MYOCARDIAL INJURY. Repeat  testing in 3-6 hrs if  clinically indicated. NOTE: An increase or decrease  of 30% or more on serial  testing suggests a  clinically important change)  Routine Hem:  12-Oct-15 04:33   WBC (CBC) 6.1  RBC (CBC)  3.03  Hemoglobin (CBC)  7.6  Hematocrit (CBC)  24.3  Platelet Count (CBC) 283  MCV 80  MCH  25.0  MCHC  31.2  RDW  17.8  Neutrophil % 73.9  Lymphocyte % 15.7  Monocyte % 6.5  Eosinophil % 2.9  Basophil % 1.0  Neutrophil # 4.5  Lymphocyte # 1.0  Monocyte # 0.4  Eosinophil # 0.2  Basophil # 0.1 (Result(s) reported on 03 Jan 2014 at 05:07AM.)   EKG:  EKG Interp. by me   Interpretation afib, 70, no acute st/t changes.   Radiology Results: XRay:    11-Oct-15 12:00, Chest PA and Lateral  Chest PA and Lateral   REASON FOR EXAM:    Shortness of Breath  COMMENTS:   May transport without cardiac monitor    PROCEDURE: DXR - DXR CHEST PA (OR AP) AND LATERAL  - Jan 02 2014 12:00PM     CLINICAL DATA:  Shortness of breath and history of atrial  fibrillation, hypertension and diabetes.    EXAM:  CHEST - 2 VIEW    COMPARISON:  11/29/2012    FINDINGS:  The heart size is stable and within normal limits. There is evidence  of mild interstitial edema/CHF. There are small bilateral pleural  effusions. No focal airspace consolidation. Mild bibasilar  atelectasis.     IMPRESSION:  Mild CHF with small bilateral pleural effusions.      Electronically Signed    By: GAletta EdouardM.D.    On: 01/02/2014 12:47         Verified By: GAzzie Roup M.D.,    No Known Allergies:   Vital Signs/Nurse's Notes: **Vital Signs.:   12-Oct-15 04:55  Vital  Signs Type Routine  Temperature Temperature (F) 98.2  Celsius 36.7   Temperature Source oral  Pulse Pulse 62  Respirations Respirations 18  Systolic BP Systolic BP 935  Diastolic BP (mmHg) Diastolic BP (mmHg) 76  Mean BP 101  Pulse Ox % Pulse Ox % 96  Pulse Ox Activity Level  At rest  Oxygen Delivery 2L    Impression 1.  Acute on chronic diastolic CHF:  Pt presented with a 2 day h/o progressive DOE and was found to be hypertensive (didn't take his meds yesterday morning) with evidence of volume overload on exam and mild CHF on CXR.  His troponins have been normal and thus far, he has been responding to IV lasix.  He has a h/o CKD III-IV and is followed by nephrology with recent adjustment of his outpt torsemide dosing.  It appears that he has a very narrow euvolemic window, complicated by CKD.  On exam today, he has bibasilar crackles with JVP to his jaw.  BP's are better but remain elevated in the 150's this morning.  HR's stable.  Cont IV diuresis.  I reviewed his home meds with he and his wife. He is not on carvedilol @ home (prev reported in [facility]).  He is on metoprolol, which has been increased to 140m bid here.  He is also on clonidine 0.163mBID @ home, along with enalapril 41m79maily.  Hold off on enalapril in the setting of active diuresis and CKD with plan to resume once euvolemic provided that renal fxn remains wnl.  2.  Acute on chronic anemia:  H/H drifting down over the past year - 13.1/37.9 in 11/2012 and 7.6/24.3 this morning.  Anemia very likely playing a role in dyspnea.  He was seen by oncology in 11/2012 with plan to initiate procrit once Hgb < 10.  He does not appear to have been seen since.  If not already done, in setting of chronic anticoagulation w/ xarelto, would check for FOB and consider GI eval.  3.  HTN Urgency:  Pt presented with BP's in the 190's.  He did not take his meds yesterday morning prior to admission.  See #1.  Resume clonidine.  Cont hydralazine and IV lasix.    4.  CKD III-IV:  Follow creat with diuresis.  Takes torsemide @  home.  ? procrit.  5.  DM II:  Per IM.  6.  HL:  On pravachol.  LDL 43 (total chol only 81).  LFT's wnl.   Electronic Signatures for Addendum Section:  AriKathlyn SacramentoD) (Signed Addendum 12-Oct-15 17:44)  The patient was seen and examined. AGree with the above. He still seems fluid overloaded. Continue IV diuresis.   Electronic Signatures: AriKathlyn SacramentoD)  (Signed 12-Oct-15 17:44)  Co-Signer: General Aspect/Present Illness, History and Physical Exam, Home Medications, Allergies BerRogelia MireP)  (Signed 12-Oct-15 11:26)  Authored: General Aspect/Present Illness, History and Physical Exam, Review of System, Home Medications, Labs, EKG , Radiology, Allergies, Vital Signs/Nurse's Notes, Impression/Plan   Last Updated: 12-Oct-15 17:44 by AriKathlyn SacramentoD)

## 2014-07-16 NOTE — Consult Note (Signed)
Note Type Consult   Subjective: Chief Complaint/Diagnosis:   Anemia secondary to chronic renal insufficiency. HPI:   Patient is an 81 year old male who was admitted to the hospital with increasing shortness of breath for 2 days secondary to underlying congestive heart failure. He continues to have shortness of breath, but has improved since admission. He has chronic weakness and fatigue. He otherwise feels well. He has no neurologic complaints. He denies any weight loss. He denies any recent fevers or illnesses. He denies any chest pain. He has no nausea, vomiting, constipation, or diarrhea. He has no urinary complaints. Patient otherwise feels well and offers no further specific complaints.   Review of Systems:  Performance Status (ECOG): 1  Review of Systems:   As per HPI. Otherwise, 10 point system review was negative.   Allergies:  No Known Allergies:   PFSH: Additional Past Medical and Surgical History: CHF, hypertension, diabetes, atrial fibrillation, hyperlipidemia, GERD, Bell's palsy, chronic renal insufficiency.    Social history: Distant tobacco use, unclear how much. Denies alcohol.    Family history: Diabetes, hypertension.   Home Medications: Medication Instructions Last Modified Date/Time  Ultram tablet 50 mg 1 tab(s) orally every 4 hours as needed  03-Oct-14 09:23  carvedilol 3.125 mg oral tablet 1 tab(s) orally 2 times a day 11-Oct-15 16:12  potassium chloride 20 mEq oral tablet, extended release 1 tab(s) orally once a day 11-Sep-14 15:20  omeprazole 40 mg oral delayed release capsule 1 cap(s) orally once a day 11-Sep-14 15:20  rivaroxaban 15 mg oral tablet 1 tab(s) orally once a day 11-Sep-14 15:20  pravastatin 40 mg oral tablet 2 tabs (69m) orally once a day (at bedtime). 11-Sep-14 15:20  torsemide 20 mg oral tablet 1 tab(s) orally 2 times a day, As Needed 11-Sep-14 15:20  HumaLOG Mix 50/50 50 units-50 units/mL subcutaneous suspension 70 unit(s) subcutaneous 2  times a day 11-Oct-15 16:12  aspirin 81 mg oral delayed release tablet 1 tab(s) orally once a day 11-Oct-15 16:12  glipiZIDE extended release 10 mg oral tablet, extended release 1 tab(s) orally 2 times a day (before meals) 11-Oct-15 16:12  hydrALAZINE 100 mg oral tablet 1 tab(s) orally 3 times a day 11-Sep-14 15:20  metoprolol tartrate 50 mg oral tablet 1.5 tab(s) orally 2 times a day 11-Sep-14 15:20  Iron 681m1 tab(s) orally 2 times a day 11-Sep-14 15:20   Vital Signs:  :: vital signs stable, patient afebrile.   Physical Exam:  General: well developed, well nourished, and in no acute distress  Mental Status: normal affect  Eyes: anicteric sclera  Head, Ears, Nose,Throat: Normocephalic, moist mucous membranes, clear oropharynx without erythema or thrush.  Neck, Thyroid: No palpable lymphadenopathy, thyroid midline without nodules.  Respiratory: clear to auscultation bilaterally  Cardiovascular: regular rate and rhythm, no murmur, rub, or gallop  Gastrointestinal: soft, nondistended, nontender, no organomegaly.  normal active bowel sounds  Musculoskeletal: No edema  Skin: No rash or petechiae noted  Neurological: alert, answering all questions appropriately.  Cranial nerves grossly intact   Laboratory Results: Thyroid:  12-Oct-15 04:33   Thyroid Stimulating Hormone 0.851 (0.45-4.50 (IU = International Unit)  ----------------------- Pregnant patients have  different reference  ranges for TSH:  - - - - - - - - - -  Pregnant, first trimetser:  0.36 - 2.50 uIU/mL)  Hepatic:  12-Oct-15 04:33   Bilirubin, Total 0.7  Alkaline Phosphatase 75 (46-116 NOTE: New Reference Range 10/12/13)  SGPT (ALT)  12 (14-63 NOTE: New Reference Range 10/12/13)  SGOT (AST)  13  Total Protein, Serum 6.4  Albumin, Serum  2.6  Routine Chem:  12-Oct-15 04:33   Ferritin ([facility]) 48 (Result(s) reported on 03 Jan 2014 at 05:53AM.)  Iron Binding Capacity (TIBC) 339  Unbound Iron Binding Capacity 306   Iron, Serum  33  Iron Saturation 10 (Result(s) reported on 03 Jan 2014 at 16:10RU.)  Folic Acid, Serum 5.4 (Result(s) reported on 03 Jan 2014 at 05:53AM.)  LDH, Serum 154 (Result(s) reported on 03 Jan 2014 at 05:53AM.)  Cholesterol, Serum 81  Triglycerides, Serum 111  HDL (INHOUSE)  16  VLDL Cholesterol Calculated 22  LDL Cholesterol Calculated 43 (Result(s) reported on 03 Jan 2014 at 05:53AM.)  Glucose, Serum  106  BUN 16  Creatinine (comp)  2.02  Sodium, Serum 144  Potassium, Serum 3.5  Chloride, Serum  108  CO2, Serum 29  Calcium (Total), Serum  8.1  Osmolality (calc) 288  eGFR (African American)  41  eGFR (Non-African American)  34 (eGFR values <56m/min/1.73 m2 may be an indication of chronic kidney disease (CKD). Calculated eGFR, using the MRDR Study equation, is useful in  patients with stable renal function. The eGFR calculation will not be reliable in acutely ill patients when serum creatinine is changing rapidly. It is not useful in patients on dialysis. The eGFR calculation may not be applicable to patients at the low and high extremes of body sizes, pregnant women, and vetetarians.)  Anion Gap 7  B-Type Natriuretic Peptide ([facility]  2853 (Result(s) reported on 03 Jan 2014 at 05:54AM.)  Hemoglobin A1c ([facility] 6.3 (The American Diabetes Association recommends that a primary goal of therapy should be <7% and that physicians should reevaluate the treatment regimen in patients with HbA1c values consistently >8%.)  Routine Hem:  12-Oct-15 04:33   WBC (CBC) 6.1  RBC (CBC)  3.03  Hemoglobin (CBC)  7.6  Hematocrit (CBC)  24.3  Platelet Count (CBC) 283  MCV 80  MCH  25.0  MCHC  31.2  RDW  17.8  Neutrophil % 73.9  Lymphocyte % 15.7  Monocyte % 6.5  Eosinophil % 2.9  Basophil % 1.0  Neutrophil # 4.5  Lymphocyte # 1.0  Monocyte # 0.4  Eosinophil # 0.2  Basophil # 0.1 (Result(s) reported on 03 Jan 2014 at 05:07AM.)   Assessment and Plan: Impression:   chronic  anemia secondary to renal insufficiency as well as iron deficiency. Plan:   1. Anemia: [place]  Both patient's iron stores and hemoglobin are decreased. B12, folate, and hemolysis labs are negative or within normal limits. He will benefit from 510 mg IV Feraheme today. He will also benefit from 40,000 units subcutaneous Procrit which will be given tomorrow. No further intervention is needed. [place] repeat laboratory work, further evaluation, and consideration of additional Procrit. Patient and his wife expressed understanding and were in agreement with this plan. consult, call with questions.  Electronic Signatures: FDelight Hoh(MD)  (Signed 12-Oct-15 16:09)  Authored: Note Type, CC/HPI, Review of Systems, ALLERGIES, Patient Family Social History, HOME MEDICATIONS, Vital Signs, Physical Exam, Lab Results Review, Assessment and Plan   Last Updated: 12-Oct-15 16:09 by FDelight Hoh(MD)

## 2014-07-16 NOTE — Discharge Summary (Signed)
PATIENT NAME:  Patrick [name], Patrick [name] MR#:  [id number] DATE OF BIRTH:  [birth year]  DATE OF ADMISSION:  01/02/2014 DATE OF DISCHARGE:  01/04/2014  DISCHARGE DIAGNOSES: 1. Acute hypoxic respiratory failure secondary to diastolic congestive heart failure exacerbation.  2. Chronic atrial fibrillation.  3. Diastolic congestive heart failure with preserved ejection fraction.  4. Hypertension.  5. Insulin-dependent diabetes mellitus.  6. Chronic kidney disease stage 4 with a baseline creatinine of 1.9.  7. Anemia of chronic disease due to chronic kidney disease.  8. Generalized weakness.   CONSULTATIONS:  1. Dr. Jacqulyn Cane, cardiology.  2. Dr. Delight Hoh, hematology/oncology.  3. Dr. Ida Rogue, cardiology.   PROCEDURES: Chest x-ray on 01/02/2014 shows mild CHF with small bilateral pleural effusions. Heart size stable and within normal limits.   A 2-D echocardiogram performed on 01/02/2014 shows left ventricular ejection fraction 60% to 65%. Normal global left ventricular systolic function. Normal right ventricular size and systolic function. Does have moderately elevated pulmonary artery systolic pressure of 99991111 mmHg.   BRIEF HISTORY OF PRESENT ILLNESS: This is a very pleasant, 81 year old man with past medical history of diastolic congestive heart failure, chronic atrial fibrillation on Xarelto and chronic kidney disease stage 4, presents to the emergency room with a chief complaint of 2 days of shortness of breath. Reports compliance with medication. No chest pain. Initially hypoxic with oxygen saturations at 83% on room air. Responded well to oxygen via nasal cannula. Chest x-ray showed pulmonary edema. Initially hypertensive with blood pressures in the 193 to 98 range. Admitted for acute congestive heart failure exacerbation.    HOSPITAL COURSE AND TREATMENT: 1. Acute hypoxic respiratory failure secondary to CHF exacerbation. Diuresed well with Lasix 40 mg IV twice a day. On  day of discharge, oxygen saturation greater than 90% on room air at rest and with ambulation.  2. Acute diastolic congestive heart failure exacerbation: Repeat 2-D echocardiogram continues to show preserved ejection fraction with elevated right-sided pressures. He diuresed well as mentioned above with 40 mg Lasix twice a day. He will continue on aspirin, beta blocker, statin and Xarelto. He was followed during this hospitalization by cardiology. He has been referred to the CHF clinic and will benefit from additional monitoring of weights and medication adjustment.  3. Chronic kidney disease stage 4 with a baseline creatinine of 1.9. This patient has a very brittle volume status and walks a fine balance between being in volume overload with CHF exacerbation versus with acute kidney injury. Prior to admission, he had been taking his torsemide twice a day Monday through Friday and holding on Saturday and Sunday. After discussion with cardiology, we agreed we could try torsemide twice a day Monday through Friday with 1 dose on Saturday, holding the second Saturday dose and holding this medication on Sunday. He will be monitored closely by the CHF clinic and by his cardiologist. At the time of discharge, his creatinine was up after aggressive diuresis at 2.3. He will have a BNP checked in 2 to 3 days after discharge to follow renal function.  4. Chronic atrial fibrillation: Rate was controlled throughout hospitalization. He will continue on home regimen of metoprolol and Xarelto.  5. Hypertension: Initially, very hypertensive on presentation, responded well to diuresis. The blood pressure is controlled at the time of discharge on metoprolol, which was increased up to 100 mg p.o. b.i.d. as well as hydralazine.  6. Anemia of chronic disease due to chronic kidney disease stage 4: The patient was seen by hematology/oncology during this  hospitalization and has received Procrit. He will need to follow up with oncology in  1 month to follow hemoglobin levels. Certainly anemia is contributing to his shortness of breath and difficulty with volume status.  7. Generalized weakness. The patient was seen by physical therapy during this admission and home health physical therapy was recommended.   DISCHARGE PHYSICAL EXAMINATION: VITAL SIGNS: Temperature 98.1, pulse 72, respirations 18, blood pressure 150/68, oxygenation 95% at rest with room air and 91% with exertion on room air.  GENERAL: No acute distress.  CARDIOVASCULAR: Irregular, rate controlled. No murmurs, rubs or gallops. No peripheral edema. Peripheral pulses are 1+.  RESPIRATORY: Lungs are clear to auscultation bilaterally with good air movement.  ABDOMEN: Soft, nontender, obese. Bowel sounds are normal.   LABORATORY DATA: Sodium 141, potassium 3.5, chloride 103, bicarbonate 32, BUN 22, creatinine 2.3. LFTs normal with the exception of a decreased serum albumin at 2.6, decreased AST and ALT. Cardiac enzymes negative x 3. TSH normal at 0.85. Hemoglobin 7.7, white blood cells 6.6, MCV 80, platelets 256,000. B12 is 231. Erythropoietin level 230. Haptoglobin 209.   CONDITION ON DISCHARGE: Stable.   DISPOSITION: The patient is discharged to home with home health physical therapy.   DISCHARGE MEDICATIONS: 1. Omeprazole 40 mg 1 capsule once a day.  2. Rivaroxaban 15 mg 1 tablet once a day.  3. Pravastatin 40 mg 2 tablets once a day.  4. Humalog mix 50/50 - 70 units subcutaneously twice a day.  5. Aspirin 81 mg 1 tablet once a day.  6. Glipizide 10 mg oral tablet extended release 1 tablet twice a day before meals.  7. Hydralazine 100 mg 1 tablet orally 3 times a day.  8. Potassium chloride 20 mEq oral tablet 1 tablet once a day.  9. Iron 65 mg 1 tablet orally 2 times a day.  10. Ultram tablet 50 mg 1 tablet every 4 hours as needed.  11. Torsemide 20 mg 1 tablet orally 2 times a day on Monday through Friday and 1 tablet once a day on Saturday and none on Sunday.   12. Clonidine 0.1 mg oral tablet 1 tablet orally twice a day.  13. Metoprolol tartrate 50 mg 2 tablets orally twice a day.   DISCHARGE INSTRUCTIONS: Per CHF clinic for daily weights.   DIET: Low-sodium low-fat diet.   ACTIVITY: Per physical therapy recommendations. No restrictions.   FOLLOW-UP:  1. One to 2 weeks with cardiology.  2. One to 2 weeks with primary care.  3. Four to 6 weeks with hematology/oncology.   TIME SPENT ON DISCHARGE: 40 minutes.      ____________________________ Earleen Newport. Volanda Napoleon, MD cpw:TT D: 01/05/2014 16:06:50 ET T: 01/05/2014 17:31:56 ET JOB#: [id number]  cc: Barnetta Chapel P. Volanda Napoleon, MD, <Dictator> Aldean Jewett MD ELECTRONICALLY SIGNED 01/06/2014 15:02

## 2014-07-16 NOTE — H&P (Signed)
PATIENT NAME:  Patrick [name], VANDERVOORT MR#:  [id number] DATE OF BIRTH:  1931-06-04  DATE OF ADMISSION:  01/02/2014  PRIMARY CARE PHYSICIAN: Lovie Macadamia, MD  EMERGENCY ROOM PHYSICIAN: Latina Craver, MD  PRIMARY CARDIOLOGIST: Minna Merritts, MD/Muhammad A. Fletcher Anon, MD  PRIMARY ONCOLOGIST:  Rae Halsted Ramiah, MD  CHIEF COMPLAINT: Shortness of breath.   HISTORY OF PRESENT ILLNESS: The patient is an 81 year old Caucasian male with past medical history of diastolic congestive heart failure, chronic atrial fibrillation on Xarelto and  multiple other medical problems, is presenting to the ED with a chief complaint of 2 day history of shortness of breath. He reports that he is compliant with his medication. Denies any chest pain. Shortness of breath is progressively getting, though he is complaint with his medications and diet. Today, he became extremely short of breath, but denies any weight gain. The patient is brought into the ED. Initial pulse oximetry was 83% on room air. The patient is placed on 2 liters of oxygen and oxygen went up to 93% to 94%. Chest x-ray has revealed pulmonary edema. The patient was given 1 dose of Lasix IV, and hospitalist team is called to admit the patient. The patient's initial blood pressure is elevated at 193/98. No family members at bedside during my examination. The patient is resting comfortably. He reports that his shortness of breath is better after the Lasix was given. No other complaints.   PAST MEDICAL HISTORY:  1.  Hypertension.  2.  Insulin-dependent diabetes mellitus.  3.  Chronic atrial fibrillation, on Xarelto. 4.  Hyperlipidemia.  5.  GERD. 6.  Bell palsy.  7.  Hyperparathyroidism ? 8.  Chronic renal insufficiency, baseline creatinine seems to be at 1.9.  9.  Chronic history of anemia, probably from underlying chronic kidney disease, recently seen by oncology.   PAST SURGICAL HISTORY: Hernia repair  ALLERGIES: No known drug allergies.    PSYCHOSOCIAL HISTORY: Lives at home with wife. Quit smoking . Denies any alcohol or illicit drug usage.   FAMILY HISTORY: Diabetes mellitus, hypertension runs in his family.   HOME MEDICATIONS: Aspirin 81 mg p.o. once daily, Xarelto 50 mg p.o. once daily, torsemide 20 mg 1 tablet p.o. 2 times a day, Ultram 50 mg p.o. every 4 hours as needed, pravastatin 40 mg 2 tablets p.o. once daily, potassium chloride 20 mEq 1 tablet p.o. once daily, omeprazole 40 mg 1 capsule p.o. once daily, metoprolol tartrate 50 mg 1-1/2 tablet p.o. 2 times a day, iron 65 mg 1 tablet p.o. 2 times a day, hydralazine 100 mg 1 tablet p.o. 3 times a day, Humalog 50/50 seventy units subcutaneously 2 times a day, glipizide extended release 10 mg 1 tablet p.o. 2 times a day, Coreg 3.125 mg 1 tablet p.o. 2 times a day.   REVIEW OF SYSTEMS: CONSTITUTIONAL: Denies any fever. Complaining of fatigue and weakness.  EYES: Denies blurry vision, double vision, glaucoma.  EARS, NOSE AND THROAT: Denies epistaxis, discharge, snoring.  RESPIRATION: Denies cough. Denies any wheezing. Complaining of shortness of breath. Denies any painful respiration.  CARDIOVASCULAR: No chest pain, palpitations or syncope.  GASTROINTESTINAL: Denies nausea, vomiting, diarrhea, abdominal pain.  GENITOURINARY: No dysuria, hematuria, renal calculi.  ENDOCRINE: Denies polyuria, nocturia. According to the records, has a chronic history of hyperparathyroidism.   HEMATOLOGIC AND LYMPHATIC: Has chronic anemia, which is probably from chronic kidney disease. Currently on iron supplements.  INTEGUMENTARY: No acne, rash or lesions.  MUSCULOSKELETAL: No joint pain in the neck and back.  Denies any shoulder pain, gout. NEUROLOGICAL: Denies vertigo, ataxia, dementia.  PSYCHIATRIC: Flat mood and affect.  PHYSICAL EXAMINATION:  VITAL SIGNS: Temperature afebrile, pulse 77, respirations 18, blood pressure 193/98, pulse 96% on 2 liters.  GENERAL APPEARANCE: Not in any acute  distress. Morbidly obese.  HEENT: Normocephalic, atraumatic. Pupils are equally reacting to light and accommodation. No scleral icterus. No conjunctival injection. No sinus tenderness. No postnasal drip. Moist mucous membranes.  NECK: Supple. Positive 2+ JVD. Range of motion is intact.  LUNGS: Positive rhonchi. No anterior chest wall tenderness.  CARDIOVASCULAR: Irregularly irregular. No murmurs.  GASTROINTESTINAL: Soft, obese. Bowel sounds are positive in all 4 quadrants. Nontender, nondistended. No hepatosplenomegaly. No masses felt.  NEUROLOGIC: Awake, alert and oriented x 3. Cranial nerves II-XII are grossly intact. Motor and sensory are intact. Reflexes are 2+.  EXTREMITIES: Trace edema is present. No cyanosis. No clubbing.  SKIN: Warm to touch. Normal turgor. No rashes. No lesions.  MUSCULOSKELETAL: No joint effusion, tenderness, erythema.  PSYCHIATRIC: Normal mood and affect.   LABORATORIES AND IMAGING STUDIES: Troponin 0.02. LFTs are normal except albumin at 3.1. WBC 6.6, hemoglobin 8.1, hematocrit 26.9, platelets are 309,000, MCV 82. Glucose 179. BNP 2708. BUN 18, creatinine 2.00. Sodium and potassium are normal. Chloride is 108, GFR of 42, anion gap is at 3. Serum osmolality and calcium are normal. Chest x-ray: Mild CHF with pulmonary edema. Twelve-lead EKG: Atrial fibrillation at 70, nonspecific anterior changes.   ASSESSMENT AND PLAN: An 81 year old Caucasian male presenting to the ED with a chief complaint of shortness of breath for the past 2 days. The patient was hypoxemic at 83% of pulse oximetry in the ED, ejection fraction of 50% to 55% according to the old echocardiogram.  1.  Acute hypoxic respiratory failure secondary to acute diastolic congestive heart failure. Previous ejection fraction was at 50% to 55%. We will admit him to telemetry. We will provide him Lasix 40 mg IV q. 12 hours. We will obtain 2D echocardiogram as it was not recently done. We will check daily weights and  monitor intake and output strictly. The patient will be benefited with outpatient chf clinic followup. Cardiology consult is placed with Dr. [name]. We will continue aspirin, beta blocker and statin. We will continue Xarelto, which is his home medication. Cycle cardiac biomarkers.  2.  Chronic atrial fibrillation, rate controlled. Continue beta blocker, metoprolol and Xarelto.  3.  Hypertension. Blood pressure is elevated. Metoprolol dose is increased to 100 mg p.o. b.i.d. Continue hydralazine. The patient is also on Coreg, which is also beta blocker, we will hold it. It needs to be clarified by cardiology.  4.  Insulin-dependent diabetes mellitus. We will continue insulin sliding scale and his home medication Humalog 50/50 seventy units subcutaneous q. 12 hours.  5.  Chronic kidney disease. The patient seems to be close to his baseline creatinine of 1.9. Sees nephrology as an outpatient.  6.  Chronic anemia, probably from underlying chronic renal insufficiency. The patient was recently evaluated by [place] regarding chronic anemia. The plan is to start him on Procrit when hemoglobin is less than 10. As hemoglobin is at 8, we will consult oncology at this time.   Diagnosis and plan of care was discussed in detail with the patient. He is aware of  the plan.   TOTAL TIME SPENT ON ADMISSION: Fifty minutes.   He is full code. Wife is the medical power of attorney.   ____________________________ Nicholes Mango, MD ag:TT D: 01/02/2014 15:40:03 ET T:  01/02/2014 16:18:01 ET JOB#: [id number]  cc: Nicholes Mango, MD, <Dictator> Irven Easterly. Kary Kos, MD Mertie Clause. Fletcher Anon, MD  Nicholes Mango MD ELECTRONICALLY SIGNED 01/03/2014 15:49

## 2014-07-25 ENCOUNTER — Ambulatory Visit: Payer: Medicare HMO

## 2014-07-25 ENCOUNTER — Other Ambulatory Visit: Payer: Medicare HMO

## 2014-08-02 DIAGNOSIS — N184 Chronic kidney disease, stage 4 (severe): Secondary | ICD-10-CM | POA: Diagnosis not present

## 2014-08-02 DIAGNOSIS — D631 Anemia in chronic kidney disease: Secondary | ICD-10-CM | POA: Diagnosis not present

## 2014-08-02 DIAGNOSIS — R269 Unspecified abnormalities of gait and mobility: Secondary | ICD-10-CM | POA: Diagnosis not present

## 2014-08-02 DIAGNOSIS — I10 Essential (primary) hypertension: Secondary | ICD-10-CM | POA: Diagnosis not present

## 2014-08-02 DIAGNOSIS — N186 End stage renal disease: Secondary | ICD-10-CM | POA: Diagnosis not present

## 2014-08-02 DIAGNOSIS — E1122 Type 2 diabetes mellitus with diabetic chronic kidney disease: Secondary | ICD-10-CM | POA: Diagnosis not present

## 2014-08-08 ENCOUNTER — Other Ambulatory Visit: Payer: Self-pay | Admitting: Oncology

## 2014-08-08 ENCOUNTER — Encounter: Payer: Self-pay | Admitting: Cardiovascular Disease

## 2014-08-08 ENCOUNTER — Inpatient Hospital Stay: Payer: Commercial Managed Care - HMO

## 2014-08-08 ENCOUNTER — Ambulatory Visit (INDEPENDENT_AMBULATORY_CARE_PROVIDER_SITE_OTHER): Payer: Commercial Managed Care - HMO | Admitting: Cardiovascular Disease

## 2014-08-08 ENCOUNTER — Inpatient Hospital Stay: Payer: Commercial Managed Care - HMO | Attending: Oncology

## 2014-08-08 VITALS — BP 89/55 | HR 67 | Ht 68.0 in | Wt 189.5 lb

## 2014-08-08 VITALS — BP 102/64 | HR 67

## 2014-08-08 DIAGNOSIS — E611 Iron deficiency: Secondary | ICD-10-CM | POA: Diagnosis not present

## 2014-08-08 DIAGNOSIS — Z79899 Other long term (current) drug therapy: Secondary | ICD-10-CM | POA: Diagnosis not present

## 2014-08-08 DIAGNOSIS — N183 Chronic kidney disease, stage 3 (moderate): Secondary | ICD-10-CM

## 2014-08-08 DIAGNOSIS — K219 Gastro-esophageal reflux disease without esophagitis: Secondary | ICD-10-CM | POA: Insufficient documentation

## 2014-08-08 DIAGNOSIS — I5032 Chronic diastolic (congestive) heart failure: Secondary | ICD-10-CM | POA: Insufficient documentation

## 2014-08-08 DIAGNOSIS — D631 Anemia in chronic kidney disease: Secondary | ICD-10-CM | POA: Insufficient documentation

## 2014-08-08 DIAGNOSIS — D638 Anemia in other chronic diseases classified elsewhere: Secondary | ICD-10-CM

## 2014-08-08 DIAGNOSIS — R5382 Chronic fatigue, unspecified: Secondary | ICD-10-CM | POA: Insufficient documentation

## 2014-08-08 DIAGNOSIS — I129 Hypertensive chronic kidney disease with stage 1 through stage 4 chronic kidney disease, or unspecified chronic kidney disease: Secondary | ICD-10-CM | POA: Insufficient documentation

## 2014-08-08 DIAGNOSIS — I951 Orthostatic hypotension: Secondary | ICD-10-CM | POA: Insufficient documentation

## 2014-08-08 DIAGNOSIS — E119 Type 2 diabetes mellitus without complications: Secondary | ICD-10-CM | POA: Insufficient documentation

## 2014-08-08 DIAGNOSIS — Z7982 Long term (current) use of aspirin: Secondary | ICD-10-CM | POA: Insufficient documentation

## 2014-08-08 DIAGNOSIS — N189 Chronic kidney disease, unspecified: Secondary | ICD-10-CM | POA: Diagnosis not present

## 2014-08-08 DIAGNOSIS — I10 Essential (primary) hypertension: Secondary | ICD-10-CM | POA: Diagnosis not present

## 2014-08-08 DIAGNOSIS — I482 Chronic atrial fibrillation: Secondary | ICD-10-CM | POA: Insufficient documentation

## 2014-08-08 DIAGNOSIS — I4891 Unspecified atrial fibrillation: Secondary | ICD-10-CM | POA: Diagnosis not present

## 2014-08-08 DIAGNOSIS — E785 Hyperlipidemia, unspecified: Secondary | ICD-10-CM

## 2014-08-08 DIAGNOSIS — R531 Weakness: Secondary | ICD-10-CM | POA: Insufficient documentation

## 2014-08-08 LAB — HEMOGLOBIN: Hemoglobin: 9.4 g/dL — ABNORMAL LOW (ref 13.0–18.0)

## 2014-08-08 MED ORDER — EPOETIN ALFA 40000 UNIT/ML IJ SOLN: 40000.0000 [IU] | Freq: Once | INTRAMUSCULAR | Status: AC

## 2014-08-08 MED ORDER — EPOETIN ALFA 40000 UNIT/ML IJ SOLN
40000.0000 [IU] | Freq: Once | INTRAMUSCULAR | Status: AC
  Administered 2014-08-08: 40000 [IU] via SUBCUTANEOUS

## 2014-08-08 NOTE — Assessment & Plan Note (Signed)
Heart rate well controlled on metoprolol. Tolerating anticoagulation. Samples provided today

## 2014-08-08 NOTE — Patient Instructions (Signed)
Blood pressure is low today Hold the clonidine and hydralazine and enalapril tonight and tomorrow  Please hold torsemide for weight less than 200 pounds  For weight 200 to 205 take one torsemide daily  For weight 206 to 210, take torsemide 1 pill twice a day For weight >210, call the office   For blood pressure <120, Hold the hydralazine  Please call us if you have new issues that need to be addressed before your next appt.  Your physician wants you to follow-up in: 1 month.

## 2014-08-08 NOTE — Assessment & Plan Note (Signed)
Suggested he stay on his pravastatin

## 2014-08-08 NOTE — Assessment & Plan Note (Signed)
Blood pressure is very low on today's visit, creatinine high, weight has dropped significantly. I suspect he is dehydrated. He almost had syncope in the parking lot. He was caught by his wife when standing up. We have recommended he hold the torsemide for weight less than 200 pounds (that would be up 10 pounds from today's visit) I suspect his baseline creatinine is closer to 2.5 as of was in November 2015. We have recommended he take torsemide 20 mg daily for weight 200 pounds up to 205 pounds. Take torsemide 20 mg twice a day for weight 206 up to 210 and call for weight more than 210 pounds We have given him instructions on holding some of his blood pressure medications today and until his blood pressure improves. He will hold hydralazine for systolic pressure less than 120

## 2014-08-08 NOTE — Assessment & Plan Note (Signed)
Creatinine well above his baseline. I suspect this is from over diuresis. Given low blood pressure, dramatic weight drop, we'll hold his torsemide until weight improves.

## 2014-08-08 NOTE — Progress Notes (Signed)
Patient ID: Patrick [name], male    DOB: Mar 25, 1932, 81 y.o.   MRN: [id number]  HPI Comments: Mr. Valle is a very pleasant 81 year old gentleman with history of hypertension, diabetes, hyperlipidemia, GERD who presented to the hospital December 30 2011 with weakness, dizziness, found to be in atrial fibrillation with RVR,  started on rate controlling medications and discharged on Cardizem 300 mg daily with anticoagulation (xarelto). He has significant lower extremity edema with Cardizem and this was weaned off within increase in his beta blocker . He presents for routine followup of his atrial fibrillation Previous diagnosis of Bell's palsy  Hospital admission 01/03/2014 with shortness of breath, diastolic CHF, pleural effusions, anemia, responding well to diuresis  In follow-up today, he was brought in on wheelchair as he had an accident out in the parking lot. His wife reports that he went down and she was unable to hold him. He did not hurt himself. ER nurses in the parking lot evaluated him and then one of her nurses brought him in.   In examination room, blood pressure is very low. He reports that he feels well with no complaints. He has been on high-dose torsemide since his last clinic visit. Weight is down significantly from 213 pounds down to 190 pounds on today's visit. Systolic pressure [id number], repeat confirmed by myself. Fluids given by mouth in the office. He is scheduled to go to cancer clinic today after his visit with [place]  EKG on today's visit shows atrial fibrillation with ventricular rate 67 bpm, PVCs lab work reviewed with him from 06/08/2014 showing climb in his creatinine up to 4.1, BUN 42. This is above his baseline creatinine 2.5 in November 2015    other past medical history  Seen by CHF clinic on 01/12/2014. Note was reviewed. No new medication changes at that time  presented to [facility] on 10/12 with increased DOE. pBNP was found to be 2708. CXR showed mild CHF with  bilateral pleural effusions. He was diuresed with IV Lasix 40 mg BID with good response. He does have a narrow euvolemic window 2/2 his CKD. He was restarted back on his home dose of torsemide 20 mg bid with double dose prn weight gain. H/H has been drifting down over the past year, has been seen by Oncology in the past with a plan for Procrit once hgb <10. Procrit and iron were given this past admission.    Previously not interested in cardioversion  He has been tolerating anticoagulation at renal dosing.  CT scan of the head in the hospital showed no intracranial process Lab work October 2013, creatinine 2.2, BUN 34 Lab work August 2013 , Hemoglobin A1c 8.1 Total cholesterol 97, LDL 48 creatinine 2.2, BUN in the 40s which is higher than his baseline BUN in the 20s. Wife reports he does not drink very much  Echocardiogram showed ejection fraction greater than 55%, mildly dilated left atrium, otherwise normal study MRI of the brain showed chronic changes without evidence of acute abnormalities  Allergies  Allergen Reactions  . No Known Allergies     Current Outpatient Prescriptions on File Prior to Visit  Medication Sig Dispense Refill  . aspirin 81 MG tablet Take 81 mg by mouth daily.    . cloNIDine (CATAPRES) 0.1 MG tablet Take 1 tablet (0.1 mg total) by mouth 2 (two) times daily. 60 tablet 11  . docusate sodium (COLACE) 100 MG capsule Take 100 mg by mouth 2 (two) times daily.    . enalapril (  VASOTEC) 5 MG tablet Take 5 mg by mouth daily.     . Ferrous Sulfate (IRON) 325 (65 FE) MG TABS Take by mouth. Take one tablet twice a day.    . hydrALAZINE (APRESOLINE) 100 MG tablet TAKE ONE TABLET BY MOUTH THREE TIMES DAILY 270 tablet 0  . Insulin Lispro Prot & Lispro (HUMALOG MIX 75/25 [place]) Inject 70 Units into the skin 2 (two) times daily.    . metoprolol (LOPRESSOR) 50 MG tablet Take 1 tablet (50 mg total) by mouth 2 (two) times daily. 180 tablet 3  . omeprazole (PRILOSEC) 40 MG capsule Take  40 mg by mouth daily.    . pravastatin (PRAVACHOL) 40 MG tablet Take two tablets daily.    . Rivaroxaban (XARELTO) 15 MG TABS tablet Take 1 tablet (15 mg total) by mouth daily. 30 tablet 3  . torsemide (DEMADEX) 20 MG tablet TAKE ONE TABLET BY MOUTH TWICE DAILY AS NEEDED (Patient taking differently: Take 20 mg by mouth. Take one tablet twice daily M-TH, once daily on Friday and Sunday and none on Saturday) 180 tablet 3  . Vitamin D, Ergocalciferol, (DRISDOL) 50000 UNITS CAPS capsule Take 50,000 Units by mouth every 30 (thirty) days.      No current facility-administered medications on file prior to visit.    Past Medical History  Diagnosis Date  . Hypertension   . Diabetes mellitus without complication   . Hyperlipidemia   . History of gastroesophageal reflux (GERD)   . CKD (chronic kidney disease), stage III     a. stage III-IV  . Permanent atrial fibrillation     a. Dx 12/2011, Rate-controlled, chronic Xarelto (renal dosing).  . Bell's palsy   . Chronic diastolic CHF (congestive heart failure)     a. 11/2012 Echo: EF 60-65%, mod conc LVH, mildly dil LA/RA, mild Ao sclerosis w/o stenosis; b. stage III-IV    . Anemia of chronic disease     a. plan of oncology to start Procrit - received this during admission 12/2013  . Atrial fibrillation   . Hyperlipidemia   . GERD (gastroesophageal reflux disease)   . Bell's palsy   . Amputated finger     Past Surgical History  Procedure Laterality Date  . Finger surgery      right hand  . Colonoscopy  09/2011  . Esophagogastroduodenoscopy    . Appendectomy      Social History  reports that he quit smoking about 44 years ago. His smoking use included Cigars. He has never used smokeless tobacco. He reports that he does not drink alcohol or use illicit drugs.  Family History family history includes Heart attack in his brother; Hypertension in an other family member.  Review of Systems  HENT: Negative.   Respiratory: Negative.    Cardiovascular: Negative.   Gastrointestinal: Negative.   Musculoskeletal: Negative.   Neurological: Positive for dizziness and weakness.  Psychiatric/Behavioral: Negative.   All other systems reviewed and are negative.   BP 89/55 mmHg  Pulse 67  Ht 5\' 8"  (1.727 m)  Wt 189 lb 8 oz (85.957 kg)  BMI 28.82 kg/m2  Physical Exam  Constitutional: He is oriented to person, place, and time. He appears well-developed and well-nourished.  HENT:  Head: Normocephalic.  Nose: Nose normal.  Mouth/Throat: Oropharynx is clear and moist.  Eyes: Conjunctivae are normal. Pupils are equal, round, and reactive to light.  Neck: Normal range of motion. Neck supple. No JVD present.  Cardiovascular: Normal rate, S1 normal, S2 normal,  normal heart sounds and intact distal pulses.  An irregularly irregular rhythm present. Exam reveals no gallop and no friction rub.   No murmur heard. Pulmonary/Chest: Effort normal and breath sounds normal. No respiratory distress. He has no wheezes. He has no rales. He exhibits no tenderness.  Abdominal: Soft. Bowel sounds are normal. He exhibits no distension. There is no tenderness.  Musculoskeletal: Normal range of motion. He exhibits no edema or tenderness.  Lymphadenopathy:    He has no cervical adenopathy.  Neurological: He is alert and oriented to person, place, and time. Coordination normal.  Skin: Skin is warm and dry. No rash noted. No erythema.  Psychiatric: He has a normal mood and affect. His behavior is normal. Judgment and thought content normal.      Assessment and Plan   Nursing note and vitals reviewed.

## 2014-08-08 NOTE — Assessment & Plan Note (Signed)
Followed by the cancer clinic. Baseline chronic anemia

## 2014-08-09 ENCOUNTER — Telehealth: Payer: Self-pay | Admitting: *Deleted

## 2014-08-09 NOTE — Telephone Encounter (Signed)
Pt wife calling stating that nurse told pt to call every day with BP readings.  This morning at 6am it was 122/59 Just letting us know.

## 2014-08-10 NOTE — Telephone Encounter (Signed)
S/w wife Vickie and informed her of Dr. Donivan Scull instructions. Pt wife verbalized understanding with no further questions.

## 2014-08-10 NOTE — Telephone Encounter (Signed)
Okay to give enalapril and metoprolol today Would hold the clonidine one more day Hold diuretic, torsemide until weight is more than 200 pounds

## 2014-08-11 ENCOUNTER — Telehealth: Payer: Self-pay | Admitting: *Deleted

## 2014-08-11 NOTE — Telephone Encounter (Signed)
Patient's wife called and his bp is 166/78 am  And 172/90 at 4:33 pm today. Is this a normal bp? Please

## 2014-08-11 NOTE — Telephone Encounter (Signed)
Pt's wife concerned about an elevated BP reading.  She will continue to monitor and report readings as advised.

## 2014-08-12 NOTE — Telephone Encounter (Signed)
Spoke w/ pt's wife.  Advised her of Dr. Donivan Scull recommendation.  She verbalizes understanding and will call back w/ BP readings next week.

## 2014-08-12 NOTE — Telephone Encounter (Signed)
Would go back on his regular blood pressure medications including the ones I stopped on his visit Would continue to hold the torsemide until his weight improved as we talked about or he gets ankle edema that is getting worse  Blood pressure was low before because he was dehydrated Now that he is hydrated more, blood pressure has gone back up If blood pressure spikes through the weekend even on his pills, may have to restart his torsemide daily

## 2014-08-12 NOTE — Telephone Encounter (Signed)
Left message for pt to call back  °

## 2014-08-23 ENCOUNTER — Inpatient Hospital Stay (HOSPITAL_BASED_OUTPATIENT_CLINIC_OR_DEPARTMENT_OTHER): Payer: Commercial Managed Care - HMO | Admitting: Oncology

## 2014-08-23 ENCOUNTER — Inpatient Hospital Stay: Payer: Commercial Managed Care - HMO

## 2014-08-23 VITALS — BP 167/76 | HR 63 | Temp 96.6°F | Resp 20 | Wt 191.0 lb

## 2014-08-23 DIAGNOSIS — E119 Type 2 diabetes mellitus without complications: Secondary | ICD-10-CM

## 2014-08-23 DIAGNOSIS — D631 Anemia in chronic kidney disease: Secondary | ICD-10-CM

## 2014-08-23 DIAGNOSIS — I129 Hypertensive chronic kidney disease with stage 1 through stage 4 chronic kidney disease, or unspecified chronic kidney disease: Secondary | ICD-10-CM | POA: Diagnosis not present

## 2014-08-23 DIAGNOSIS — R531 Weakness: Secondary | ICD-10-CM

## 2014-08-23 DIAGNOSIS — N183 Chronic kidney disease, stage 3 (moderate): Secondary | ICD-10-CM

## 2014-08-23 DIAGNOSIS — I482 Chronic atrial fibrillation: Secondary | ICD-10-CM

## 2014-08-23 DIAGNOSIS — D638 Anemia in other chronic diseases classified elsewhere: Secondary | ICD-10-CM

## 2014-08-23 DIAGNOSIS — E611 Iron deficiency: Secondary | ICD-10-CM | POA: Diagnosis not present

## 2014-08-23 DIAGNOSIS — Z7982 Long term (current) use of aspirin: Secondary | ICD-10-CM

## 2014-08-23 DIAGNOSIS — Z79899 Other long term (current) drug therapy: Secondary | ICD-10-CM | POA: Diagnosis not present

## 2014-08-23 DIAGNOSIS — K219 Gastro-esophageal reflux disease without esophagitis: Secondary | ICD-10-CM

## 2014-08-23 DIAGNOSIS — R5382 Chronic fatigue, unspecified: Secondary | ICD-10-CM

## 2014-08-23 DIAGNOSIS — N189 Chronic kidney disease, unspecified: Secondary | ICD-10-CM | POA: Diagnosis not present

## 2014-08-23 DIAGNOSIS — I5032 Chronic diastolic (congestive) heart failure: Secondary | ICD-10-CM

## 2014-08-23 LAB — HEMOGLOBIN: HEMOGLOBIN: 8.2 g/dL — AB (ref 13.0–18.0)

## 2014-08-23 NOTE — Progress Notes (Signed)
B/p remains elevated. Pt denies any complications. Dr Grayland Ormond notified, pt to skip today and return next week for injection.

## 2014-08-24 ENCOUNTER — Inpatient Hospital Stay: Payer: Commercial Managed Care - HMO | Attending: Oncology

## 2014-08-24 VITALS — BP 113/62 | HR 68 | Resp 18

## 2014-08-24 DIAGNOSIS — Z79899 Other long term (current) drug therapy: Secondary | ICD-10-CM | POA: Insufficient documentation

## 2014-08-24 DIAGNOSIS — D638 Anemia in other chronic diseases classified elsewhere: Secondary | ICD-10-CM

## 2014-08-24 DIAGNOSIS — N183 Chronic kidney disease, stage 3 (moderate): Secondary | ICD-10-CM | POA: Insufficient documentation

## 2014-08-24 MED ORDER — EPOETIN ALFA 40000 UNIT/ML IJ SOLN
40000.0000 [IU] | Freq: Once | INTRAMUSCULAR | Status: AC
  Administered 2014-08-24: 40000 [IU] via SUBCUTANEOUS
  Filled 2014-08-24: qty 1

## 2014-08-25 DIAGNOSIS — R809 Proteinuria, unspecified: Secondary | ICD-10-CM | POA: Diagnosis not present

## 2014-08-25 DIAGNOSIS — N2581 Secondary hyperparathyroidism of renal origin: Secondary | ICD-10-CM | POA: Diagnosis not present

## 2014-08-25 DIAGNOSIS — N184 Chronic kidney disease, stage 4 (severe): Secondary | ICD-10-CM | POA: Diagnosis not present

## 2014-08-25 DIAGNOSIS — I10 Essential (primary) hypertension: Secondary | ICD-10-CM | POA: Diagnosis not present

## 2014-08-25 DIAGNOSIS — D631 Anemia in chronic kidney disease: Secondary | ICD-10-CM | POA: Diagnosis not present

## 2014-08-25 DIAGNOSIS — E1122 Type 2 diabetes mellitus with diabetic chronic kidney disease: Secondary | ICD-10-CM | POA: Diagnosis not present

## 2014-09-19 ENCOUNTER — Inpatient Hospital Stay: Payer: Commercial Managed Care - HMO

## 2014-09-19 ENCOUNTER — Encounter: Payer: Self-pay | Admitting: Cardiovascular Disease

## 2014-09-19 ENCOUNTER — Ambulatory Visit (INDEPENDENT_AMBULATORY_CARE_PROVIDER_SITE_OTHER): Payer: Commercial Managed Care - HMO | Admitting: Cardiovascular Disease

## 2014-09-19 VITALS — BP 106/64 | HR 64

## 2014-09-19 VITALS — BP 124/62 | HR 65 | Ht 68.0 in | Wt 182.0 lb

## 2014-09-19 DIAGNOSIS — D638 Anemia in other chronic diseases classified elsewhere: Secondary | ICD-10-CM

## 2014-09-19 DIAGNOSIS — I5032 Chronic diastolic (congestive) heart failure: Secondary | ICD-10-CM | POA: Diagnosis not present

## 2014-09-19 DIAGNOSIS — I4891 Unspecified atrial fibrillation: Secondary | ICD-10-CM | POA: Diagnosis not present

## 2014-09-19 DIAGNOSIS — Z79899 Other long term (current) drug therapy: Secondary | ICD-10-CM | POA: Diagnosis not present

## 2014-09-19 DIAGNOSIS — I10 Essential (primary) hypertension: Secondary | ICD-10-CM | POA: Diagnosis not present

## 2014-09-19 DIAGNOSIS — N183 Chronic kidney disease, stage 3 (moderate): Secondary | ICD-10-CM

## 2014-09-19 LAB — HEMOGLOBIN: Hemoglobin: 9.5 g/dL — ABNORMAL LOW (ref 13.0–18.0)

## 2014-09-19 MED ORDER — DOXAZOSIN MESYLATE 4 MG PO TABS: 4.0000 mg | ORAL_TABLET | Freq: Every day | ORAL | Status: DC

## 2014-09-19 MED ORDER — EPOETIN ALFA 40000 UNIT/ML IJ SOLN
40000.0000 [IU] | Freq: Once | INTRAMUSCULAR | Status: AC
  Administered 2014-09-19: 40000 [IU] via SUBCUTANEOUS
  Filled 2014-09-19: qty 1

## 2014-09-19 NOTE — Progress Notes (Signed)
Patient ID: Patrick [name], male    DOB: 15-Nov-1931, 81 y.o.   MRN: [id number]  HPI Comments: Patrick [name] is a very pleasant 81 year old gentleman with history of hypertension, diabetes, hyperlipidemia, GERD who presented to the hospital December 30 2011 with weakness, dizziness, found to be in atrial fibrillation with RVR,  started on rate controlling medications and discharged on Cardizem 300 mg daily with anticoagulation (xarelto). He has significant lower extremity edema with Cardizem and this was weaned off within increase in his beta blocker . He presents for routine followup of his atrial fibrillation Previous diagnosis of Bell's palsy  Hospital admission 01/03/2014 with shortness of breath, diastolic CHF, pleural effusions, anemia, responding well to diuresis He presents today for follow-up of his atrial fibrillation  On his last clinic visit, he was hypotensive, weight was down, diuretics were held for suspected dehydration. On today's visit, weight continues to decline down to 182 pounds down from 187 pounds   no further lightheaded episodes, wife reports he is doing well he is not eating very much but is adequately hydrated She is not giving torsemide, only for significant weight gain on none recently He has close follow-up with the [place] for anemia Blood pressure very elevated in the morning, 160 up to 180, improved in the afternoon  EKG on today's visit shows atrial fibrillation with ventricular rate 65 bpm, no significant ST or T-wave changes  06/08/2014 showing climb in his creatinine up to 4.1, BUN 42. This is above his baseline creatinine 2.5 in November 2015    other past medical history  Seen by CHF clinic on 01/12/2014. Note was reviewed. No new medication changes at that time  presented to [facility] on 10/12 with increased DOE. pBNP was found to be 2708. CXR showed mild CHF with bilateral pleural effusions. He was diuresed with IV Lasix 40 mg BID with good  response. He does have a narrow euvolemic window 2/2 his CKD. He was restarted back on his home dose of torsemide 20 mg bid with double dose prn weight gain. H/H has been drifting down over the past year, has been seen by Oncology in the past with a plan for Procrit once hgb <10. Procrit and iron were given this past admission.    Previously not interested in cardioversion  He has been tolerating anticoagulation at renal dosing.  CT scan of the head in the hospital showed no intracranial process Lab work October 2013, creatinine 2.2, BUN 34 Lab work August 2013 , Hemoglobin A1c 8.1 Total cholesterol 97, LDL 48 creatinine 2.2, BUN in the 40s which is higher than his baseline BUN in the 20s. Wife reports he does not drink very much  Echocardiogram showed ejection fraction greater than 55%, mildly dilated left atrium, otherwise normal study MRI of the brain showed chronic changes without evidence of acute abnormalities  Allergies  Allergen Reactions  . No Known Allergies     Current Outpatient Prescriptions on File Prior to Visit  Medication Sig Dispense Refill  . aspirin 81 MG tablet Take 81 mg by mouth daily.    . cloNIDine (CATAPRES) 0.1 MG tablet Take 1 tablet (0.1 mg total) by mouth 2 (two) times daily. 60 tablet 11  . docusate sodium (COLACE) 100 MG capsule Take 100 mg by mouth 2 (two) times daily.    . enalapril (VASOTEC) 5 MG tablet Take 5 mg by mouth daily.     . Ferrous Sulfate (IRON) 325 (65 FE) MG TABS Take by mouth.  Take one tablet twice a day.    . hydrALAZINE (APRESOLINE) 100 MG tablet TAKE ONE TABLET BY MOUTH THREE TIMES DAILY 270 tablet 0  . Insulin Lispro Prot & Lispro (HUMALOG MIX 75/25 [place]) Inject 70 Units into the skin 2 (two) times daily.    . metoprolol (LOPRESSOR) 50 MG tablet Take 1 tablet (50 mg total) by mouth 2 (two) times daily. 180 tablet 3  . omeprazole (PRILOSEC) 40 MG capsule Take 40 mg by mouth daily.    . pravastatin (PRAVACHOL) 40 MG tablet Take two  tablets daily.    . Rivaroxaban (XARELTO) 15 MG TABS tablet Take 1 tablet (15 mg total) by mouth daily. 30 tablet 3  . torsemide (DEMADEX) 20 MG tablet TAKE ONE TABLET BY MOUTH TWICE DAILY AS NEEDED 180 tablet 3  . Vitamin D, Ergocalciferol, (DRISDOL) 50000 UNITS CAPS capsule Take 50,000 Units by mouth every 30 (thirty) days.      Current Facility-Administered Medications on File Prior to Visit  Medication Dose Route Frequency Provider Last Rate Last Dose  . epoetin alfa (EPOGEN,PROCRIT) injection 40,000 Units  40,000 Units Subcutaneous Once Lloyd Huger, MD        Past Medical History  Diagnosis Date  . Hypertension   . Diabetes mellitus without complication   . Hyperlipidemia   . History of gastroesophageal reflux (GERD)   . CKD (chronic kidney disease), stage III     a. stage III-IV  . Permanent atrial fibrillation     a. Dx 12/2011, Rate-controlled, chronic Xarelto (renal dosing).  . Bell's palsy   . Chronic diastolic CHF (congestive heart failure)     a. 11/2012 Echo: EF 60-65%, mod conc LVH, mildly dil LA/RA, mild Ao sclerosis w/o stenosis; b. stage III-IV    . Anemia of chronic disease     a. plan of oncology to start Procrit - received this during admission 12/2013  . Atrial fibrillation   . Hyperlipidemia   . GERD (gastroesophageal reflux disease)   . Bell's palsy   . Amputated finger     Past Surgical History  Procedure Laterality Date  . Finger surgery      right hand  . Colonoscopy  09/2011  . Esophagogastroduodenoscopy    . Appendectomy      Social History  reports that he quit smoking about 44 years ago. His smoking use included Cigars. He has never used smokeless tobacco. He reports that he does not drink alcohol or use illicit drugs.  Family History family history includes Heart attack in his brother; Hypertension in an other family member.  Review of Systems  Respiratory: Negative.   Cardiovascular: Negative.   Gastrointestinal: Negative.    Musculoskeletal: Negative.   Neurological: Negative.   Psychiatric/Behavioral: Negative.   All other systems reviewed and are negative.   BP 124/62 mmHg  Pulse 65  Ht 5\' 8"  (1.727 m)  Wt 182 lb (82.555 kg)  BMI 27.68 kg/m2  Physical Exam  Constitutional: He is oriented to person, place, and time. He appears well-developed and well-nourished.  HENT:  Head: Normocephalic.  Nose: Nose normal.  Mouth/Throat: Oropharynx is clear and moist.  Eyes: Conjunctivae are normal. Pupils are equal, round, and reactive to light.  Neck: Normal range of motion. Neck supple. No JVD present.  Cardiovascular: Normal rate, S1 normal, S2 normal, normal heart sounds and intact distal pulses.  An irregularly irregular rhythm present. Exam reveals no gallop and no friction rub.   No murmur heard. Pulmonary/Chest: Effort normal  and breath sounds normal. No respiratory distress. He has no wheezes. He has no rales. He exhibits no tenderness.  Abdominal: Soft. Bowel sounds are normal. He exhibits no distension. There is no tenderness.  Musculoskeletal: Normal range of motion. He exhibits no edema or tenderness.  Lymphadenopathy:    He has no cervical adenopathy.  Neurological: He is alert and oriented to person, place, and time. Coordination normal.  Skin: Skin is warm and dry. No rash noted. No erythema.  Psychiatric: He has a normal mood and affect. His behavior is normal. Judgment and thought content normal.      Assessment and Plan   Nursing note and vitals reviewed.

## 2014-09-19 NOTE — Assessment & Plan Note (Signed)
Followed by oncology 

## 2014-09-19 NOTE — Assessment & Plan Note (Signed)
He appears relatively euvolemic. Taking torsemide only as needed Weight continues to drop likely from decreased by mouth intake, anorexia

## 2014-09-19 NOTE — Patient Instructions (Addendum)
You are doing well.  We will check labs today, BMP  Please start cardura at night (dozasoxin) for blood pressure and frequent urination  Please call us if you have new issues that need to be addressed before your next appt.  Your physician wants you to follow-up in: 6 months.  You will receive a reminder letter in the mail two months in advance. If you don't receive a letter, please call our office to schedule the follow-up appointment.

## 2014-09-19 NOTE — Assessment & Plan Note (Signed)
Heart rate well controlled. No changes to his medications. Encouraged him to stay on anticoagulation

## 2014-09-19 NOTE — Assessment & Plan Note (Signed)
Blood pressure elevated in the mornings. We'll add Cardura 4 mg daily at bedtime given his nocturia

## 2014-09-20 ENCOUNTER — Ambulatory Visit: Payer: Commercial Managed Care - HMO

## 2014-09-20 ENCOUNTER — Telehealth: Payer: Self-pay | Admitting: Cardiovascular Disease

## 2014-09-20 ENCOUNTER — Other Ambulatory Visit: Payer: Commercial Managed Care - HMO

## 2014-09-20 ENCOUNTER — Ambulatory Visit: Payer: Medicare HMO | Admitting: Family

## 2014-09-20 LAB — BASIC METABOLIC PANEL
BUN/Creatinine Ratio: 9 — ABNORMAL LOW (ref 10–22)
BUN: 23 mg/dL (ref 8–27)
CALCIUM: 9.1 mg/dL (ref 8.6–10.2)
CO2: 23 mmol/L (ref 18–29)
CREATININE: 2.45 mg/dL — AB (ref 0.76–1.27)
Chloride: 103 mmol/L (ref 97–108)
GFR calc Af Amer: 27 mL/min/{1.73_m2} — ABNORMAL LOW (ref >59)
GFR, EST NON AFRICAN AMERICAN: 24 mL/min/{1.73_m2} — AB (ref >59)
Glucose: 150 mg/dL — ABNORMAL HIGH (ref 65–99)
POTASSIUM: 4.4 mmol/L (ref 3.5–5.2)
Sodium: 141 mmol/L (ref 134–144)

## 2014-09-20 NOTE — Progress Notes (Signed)
Patrick [name]  [phone] [phone]  ID: EDIS WARRENFELTZ OB: 09-07-1931  MR#: [id number]  [id number]  Patient Care Team: [place] Pink, MD as PCP - General (Family Medicine)  CHIEF COMPLAINT:  Chief Complaint  Patient presents with  . Follow-up    Anemia, procrit    INTERVAL HISTORY: Patient returns to clinic today for further evaluation, laboratory work, and consideration of additional Procrit. He currently feels well and at his baseline. He does not complain of shortness of breath today. He has chronic weakness and fatigue. He has no neurologic complaints. He denies any weight loss. He denies any recent fevers or illnesses. He denies any chest pain. He has no nausea, vomiting, constipation, or diarrhea. He has no urinary complaints. Patient offers no further specific complaints.   REVIEW OF SYSTEMS:   Review of Systems  Constitutional: Positive for malaise/fatigue.  Respiratory: Negative.   Cardiovascular: Negative.   Neurological: Positive for weakness.    As per HPI. Otherwise, a complete review of systems is negatve.  PAST MEDICAL HISTORY: Past Medical History  Diagnosis Date  . Hypertension   . Diabetes mellitus without complication   . Hyperlipidemia   . History of gastroesophageal reflux (GERD)   . CKD (chronic kidney disease), stage III     a. stage III-IV  . Permanent atrial fibrillation     a. Dx 12/2011, Rate-controlled, chronic Xarelto (renal dosing).  . Bell's palsy   . Chronic diastolic CHF (congestive heart failure)     a. 11/2012 Echo: EF 60-65%, mod conc LVH, mildly dil LA/RA, mild Ao sclerosis w/o stenosis; b. stage III-IV    . Anemia of chronic disease     a. plan of oncology to start Procrit - received this during admission 12/2013  . Atrial fibrillation   . Hyperlipidemia   . GERD (gastroesophageal reflux disease)   . Bell's palsy   . Amputated finger     PAST SURGICAL HISTORY: Past Surgical  History  Procedure Laterality Date  . Finger surgery      right hand  . Colonoscopy  09/2011  . Esophagogastroduodenoscopy    . Appendectomy      FAMILY HISTORY Family History  Problem Relation Age of Onset  . Heart attack Brother   . Hypertension         ADVANCED DIRECTIVES:    HEALTH MAINTENANCE: History  Substance Use Topics  . Smoking status: Former Smoker -- 0.25 packs/day for 10 years    Types: Cigars    Quit date: 05/06/1970  . Smokeless tobacco: Never Used  . Alcohol Use: No     Colonoscopy:  PAP:  Bone density:  Lipid panel:  Allergies  Allergen Reactions  . No Known Allergies     Current Outpatient Prescriptions  Medication Sig Dispense Refill  . aspirin 81 MG tablet Take 81 mg by mouth daily.    . cloNIDine (CATAPRES) 0.1 MG tablet Take 1 tablet (0.1 mg total) by mouth 2 (two) times daily. 60 tablet 11  . docusate sodium (COLACE) 100 MG capsule Take 100 mg by mouth 2 (two) times daily.    . enalapril (VASOTEC) 5 MG tablet Take 5 mg by mouth daily.     . Ferrous Sulfate (IRON) 325 (65 FE) MG TABS Take by mouth. Take one tablet twice a day.    . hydrALAZINE (APRESOLINE) 100 MG tablet TAKE ONE TABLET BY MOUTH THREE TIMES DAILY 270 tablet 0  . Insulin Lispro Prot & Lispro (HUMALOG MIX  75/25 [place]) Inject 70 Units into the skin 2 (two) times daily.    . metoprolol (LOPRESSOR) 50 MG tablet Take 1 tablet (50 mg total) by mouth 2 (two) times daily. 180 tablet 3  . omeprazole (PRILOSEC) 40 MG capsule Take 40 mg by mouth daily.    . pravastatin (PRAVACHOL) 40 MG tablet Take two tablets daily.    . Rivaroxaban (XARELTO) 15 MG TABS tablet Take 1 tablet (15 mg total) by mouth daily. 30 tablet 3  . Vitamin D, Ergocalciferol, (DRISDOL) 50000 UNITS CAPS capsule Take 50,000 Units by mouth every 30 (thirty) days.     Marland Kitchen doxazosin (CARDURA) 4 MG tablet Take 1 tablet (4 mg total) by mouth at bedtime. 90 tablet 3  . torsemide (DEMADEX) 20 MG tablet TAKE ONE TABLET BY MOUTH  TWICE DAILY AS NEEDED 180 tablet 3   No current facility-administered medications for this visit.   Facility-Administered Medications Ordered in Other Visits  Medication Dose Route Frequency Provider Last Rate Last Dose  . epoetin alfa (EPOGEN,PROCRIT) injection 40,000 Units  40,000 Units Subcutaneous Once Lloyd Huger, MD        OBJECTIVE: Filed Vitals:   08/23/14 1535  BP: 167/76  Pulse: 63  Temp: 96.6 F (35.9 C)  Resp: 20     Body mass index is 29.05 kg/(m^2).    ECOG FS:1 - Symptomatic but completely ambulatory  General: Well-developed, well-nourished, no acute distress. Eyes: anicteric sclera. Lungs: Clear to auscultation bilaterally. Heart: Regular rate and rhythm. No rubs, murmurs, or gallops. Abdomen: Soft, nontender, nondistended. No organomegaly noted, normoactive bowel sounds. Musculoskeletal: No edema, cyanosis, or clubbing. Neuro: Alert, answering all questions appropriately. Cranial nerves grossly intact. Skin: No rashes or petechiae noted. Psych: Normal affect.   LAB RESULTS:  Lab Results  Component Value Date   NA 141 09/19/2014   K 4.4 09/19/2014   CL 103 09/19/2014   CO2 23 09/19/2014   GLUCOSE 150* 09/19/2014   BUN 23 09/19/2014   CREATININE 2.45* 09/19/2014   CALCIUM 9.1 09/19/2014   PROT 6.4 01/03/2014   ALBUMIN 2.6* 01/03/2014   AST 13* 01/03/2014   ALT 12* 01/03/2014   ALKPHOS 75 01/03/2014   GFRNONAA 24* 09/19/2014   GFRAA 27* 09/19/2014    Lab Results  Component Value Date   WBC 4.1 05/02/2014   NEUTROABS 3.0 05/02/2014   HGB 9.5* 09/19/2014   HCT 28.4* 05/02/2014   MCV 85 05/02/2014   PLT 295 05/02/2014     STUDIES: No results found.  ASSESSMENT: Chronic anemia secondary to renal insufficiency as well as iron deficiency.  PLAN:    1. Anemia: Patient's hemoglobin is less than 10.0 therefore he will benefit from 40,000 units of subcutaneous Procrit today. Return to clinic every 4 weeks for consideration of Procrit if  his hemoglobin is below 10.0. Patient's iron stores are still mildly decreased and he will likely need IV Feraheme in the future. He did receive one infusion of IV Feraheme in mid October 2015.  Patient and his wife expressed understanding and were in agreement with this plan. 2. Chronic renal insufficiency: Patient's creatinine is approximately his baseline. Treatment per nephrology.   Patient expressed understanding and was in agreement with this plan. He also understands that He can call clinic at any time with any questions, concerns, or complaints.   No matching staging information was found for the patient.  Lloyd Huger, MD   09/20/2014 5:51 PM

## 2014-09-20 NOTE — Telephone Encounter (Signed)
Patient wife wants lab results when available.

## 2014-10-05 ENCOUNTER — Other Ambulatory Visit: Payer: Self-pay

## 2014-10-05 ENCOUNTER — Emergency Department: Payer: Commercial Managed Care - HMO

## 2014-10-05 ENCOUNTER — Encounter: Payer: Self-pay | Admitting: Emergency Medicine

## 2014-10-05 ENCOUNTER — Telehealth: Payer: Self-pay | Admitting: *Deleted

## 2014-10-05 ENCOUNTER — Emergency Department
Admission: EM | Admit: 2014-10-05 | Discharge: 2014-10-05 | Disposition: A | Discharge status: Home / Self Care | Payer: Commercial Managed Care - HMO | Attending: Emergency Medicine | Admitting: Emergency Medicine

## 2014-10-05 DIAGNOSIS — I129 Hypertensive chronic kidney disease with stage 1 through stage 4 chronic kidney disease, or unspecified chronic kidney disease: Secondary | ICD-10-CM | POA: Diagnosis not present

## 2014-10-05 DIAGNOSIS — E119 Type 2 diabetes mellitus without complications: Secondary | ICD-10-CM | POA: Insufficient documentation

## 2014-10-05 DIAGNOSIS — Z87891 Personal history of nicotine dependence: Secondary | ICD-10-CM | POA: Diagnosis not present

## 2014-10-05 DIAGNOSIS — N183 Chronic kidney disease, stage 3 (moderate): Secondary | ICD-10-CM | POA: Insufficient documentation

## 2014-10-05 DIAGNOSIS — J90 Pleural effusion, not elsewhere classified: Secondary | ICD-10-CM | POA: Insufficient documentation

## 2014-10-05 DIAGNOSIS — Z79899 Other long term (current) drug therapy: Secondary | ICD-10-CM | POA: Insufficient documentation

## 2014-10-05 DIAGNOSIS — Z7982 Long term (current) use of aspirin: Secondary | ICD-10-CM | POA: Insufficient documentation

## 2014-10-05 DIAGNOSIS — R0602 Shortness of breath: Secondary | ICD-10-CM | POA: Diagnosis not present

## 2014-10-05 DIAGNOSIS — J810 Acute pulmonary edema: Secondary | ICD-10-CM

## 2014-10-05 LAB — CBC WITH DIFFERENTIAL/PLATELET
Basophils Absolute: 0 10*3/uL (ref 0–0.1)
Basophils Relative: 1 %
EOS PCT: 2 %
Eosinophils Absolute: 0.1 10*3/uL (ref 0–0.7)
HCT: 27.2 % — ABNORMAL LOW (ref 40.0–52.0)
Hemoglobin: 9 g/dL — ABNORMAL LOW (ref 13.0–18.0)
LYMPHS PCT: 18 %
Lymphs Abs: 0.7 10*3/uL — ABNORMAL LOW (ref 1.0–3.6)
MCH: 27.6 pg (ref 26.0–34.0)
MCHC: 33 g/dL (ref 32.0–36.0)
MCV: 83.6 fL (ref 80.0–100.0)
MONOS PCT: 4 %
Monocytes Absolute: 0.2 10*3/uL (ref 0.2–1.0)
NEUTROS ABS: 3 10*3/uL (ref 1.4–6.5)
NEUTROS PCT: 75 %
PLATELETS: 255 10*3/uL (ref 150–440)
RBC: 3.25 MIL/uL — AB (ref 4.40–5.90)
RDW: 17.4 % — ABNORMAL HIGH (ref 11.5–14.5)
WBC: 4 10*3/uL (ref 3.8–10.6)

## 2014-10-05 LAB — BASIC METABOLIC PANEL
Anion gap: 5 (ref 5–15)
BUN: 24 mg/dL — ABNORMAL HIGH (ref 6–20)
CO2: 24 mmol/L (ref 22–32)
Calcium: 8.1 mg/dL — ABNORMAL LOW (ref 8.9–10.3)
Chloride: 109 mmol/L (ref 101–111)
Creatinine, Ser: 2.34 mg/dL — ABNORMAL HIGH (ref 0.61–1.24)
GFR, EST AFRICAN AMERICAN: 28 mL/min — AB (ref >60)
GFR, EST NON AFRICAN AMERICAN: 24 mL/min — AB (ref >60)
Glucose, Bld: 138 mg/dL — ABNORMAL HIGH (ref 65–99)
POTASSIUM: 4 mmol/L (ref 3.5–5.1)
SODIUM: 138 mmol/L (ref 135–145)

## 2014-10-05 LAB — TROPONIN I: Troponin I: <0.03 ng/mL (ref <0.031)

## 2014-10-05 NOTE — Telephone Encounter (Signed)
EMT calling from patients home, stating wife called for pt is been having SOB for the past few days.  He is wanting to come see [place] for this. Please advise.

## 2014-10-05 NOTE — Telephone Encounter (Signed)
Spoke w/ EMT. He advised of pt's vitals and I relayed to Dr. [name] as he was reporting. He reports that pt refuses to go to ED, requests an appt w/ Dr. [name] today. Per Dr. [name], have pt take 1-2 extra torsemide and call back this afternoon, as pt does not need appt to come in to advise him to increase diuretic.  EMT verbalizes understanding and will relay message to pt.  Asked him to have pt call back around 3:00.

## 2014-10-05 NOTE — Discharge Instructions (Signed)
Pleural Effusion The lining covering your lungs and the inside of your chest is called the pleura. Usually, the space between the two pleura contains no air and only a thin layer of fluid. A pleural effusion is an abnormal buildup of fluid in the pleural space. Fluid gathers when there is increased pressure in the lung vessels. This forces fluids out of the lungs and into the pleural space. Vessels may also leak fluids when there are infections, such as pneumonia, or other causes of soreness and redness (inflammation). Fluids leak into the lungs when protein in the blood is low or when certain vessels (lymphatics) are blocked. Finding a pleural effusion is important because it is usually caused by another disease. In order to treat a pleural effusion, your health care provider needs to find its cause. If left untreated, a large amount of fluid can build up and cause collapse of the lung. CAUSES   Heart failure.  Infections (pneumonia, tuberculosis), pulmonary embolism, pulmonary infarction.  Cancer (primary lung and metastatic), asbestosis.  Liver failure (cirrhosis).  Nephrotic syndrome, peritoneal dialysis, kidney problems (uremia).  Collagen vascular disease (systemic lupus erythematosus, rheumatoid arthritis).  Injury (trauma) to the chest or rupture of the digestive tube (esophagus).  Material in the chest or pleural space (hemothorax, chylothorax).  Pancreatitis.  Surgery.  Drug reactions. SYMPTOMS  A pleural effusion can decrease the amount of space available for breathing and make you short of breath. The fluid can become infected, which may cause pain and fever. Often, the pain is worse when taking a deep breath. The underlying disease (heart failure, pneumonia, blood clot, tuberculosis, cancer) may also cause symptoms. DIAGNOSIS   Your health care provider can usually tell what is wrong by talking to you (taking a history), doing an exam, and taking a routine X-ray. If the  X-ray shows fluid in your chest, often fluid is removed from your chest with a needle for testing (diagnostic thoracentesis).  Sometimes, more specialized X-rays may be needed.  Sometimes, a small piece of tissue is removed and examined by a specialist (biopsy). TREATMENT  Treatment varies based on what caused the pleural effusion. Treatments include:  Removing as much fluid as possible using a needle (thoracentesis) to improve the cough and shortness of breath. This is a simple procedure that can be done at bedside. The risks are bleeding, infection, collapse of a lung, or low blood pressure.  Placing a tube in the chest to drain the effusion (tube thoracostomy). This is often used when there is an infection in the fluid. This is a simple procedure that can often be done at bedside or in a clinic. The procedure may be painful. The risks are the same as using a needle to drain the fluid. The chest tube usually remains for a few days and is connected to suction to improve fluid drainage. After placement, the tube usually does not cause much discomfort.  Surgical removal of fibrous debris in and around the pleural space (decortication). This may be done with a flexible telescope (thoracoscope) through a small or large cut (incision). This is helpful for patients who have fibrosis or scar tissue that prevents complete lung expansion. The risks are infection, blood loss, and side effects from general anesthesia.  Sometimes, a procedure called pleurodesis is done. A chest tube is placed and the fluid is drained. Next, an agent (tetracycline, talc powder) is added to the pleural space. This causes the lung and chest wall to stick together (adhesion). This leaves no  potential space for fluid to build up. The risks include infection, blood loss, and side effects from general anesthesia.  If the effusion is caused by infection, it may be treated with antibiotics and may improve without draining. HOME CARE  INSTRUCTIONS   Take any medicines exactly as prescribed.  Follow up with your health care provider as directed.  Monitor your exercise capacity (the amount of walking you can do before you get short of breath).  Do not use any tobacco products including cigarettes, chewing tobacco, or electronic cigarettes. SEEK MEDICAL CARE IF:   Your exercise capacity seems to get worse or does not improve with time.  You do not recover from your illness.  You have drainage, redness, swelling, or pain at any incision or puncture sites. SEEK IMMEDIATE MEDICAL CARE IF:   Shortness of breath or chest pain develops or gets worse.  You have a fever.  You develop a new cough, especially if the mucus (phlegm) is discolored. MAKE SURE YOU:   Understand these instructions.  Will watch your condition.  Will get help right away if you are not doing well or get worse. Document Released: 03/11/2005 Document Revised: 07/26/2013 Document Reviewed: 10/31/2006 [facility] Patient Information 2015 [place], [place]. This information is not intended to replace advice given to you by your health care provider. Make sure you discuss any questions you have with your health care provider.      As we have discussed please take torsemide 20 mg once a day in the morning tomorrow 7/14, and 7/15. After which please take every other day in the morning for the next 7 days. These follow-up with your nephrologist as scheduled tomorrow. He will also need follow-up Monday or Tuesday of this coming week for recheck of her labs including kidney function. Please return to the emergency department for any acute worsening of your trouble breathing, or any chest pain.

## 2014-10-05 NOTE — Telephone Encounter (Signed)
Pt is currently in [facility] ED.

## 2014-10-05 NOTE — ED Notes (Signed)
Pt ambulated down hall and back (66 meters). O2 saturation maintained at 94% or better. Pt stopped on two occasions stating that he was "tired and just needed to rest for a minute".

## 2014-10-05 NOTE — ED Provider Notes (Addendum)
[facility] Emergency Department Provider Note  Time seen: 3:12 PM  I have reviewed the triage vital signs and the nursing notes.   HISTORY  Chief Complaint Shortness of Breath    HPI Patrick [name] is a 81 y.o. male with a past medical history of hypertension, diabetes, hyperlipidemia, CK D stage III, diastolic CHF, atrial fibrillation, who presents to the emergency department with shortness of breath 3 days. According to the patient last 3 days he has had intermittent shortness of breath, somewhat worse with exertion. Denies any chest pain at any time. Patient is currently being followed by nephrology for his chronic kidney disease. He states they recently began talking about dialysis, but they stated he is not quite to that point. Patient denies any increased swelling in his legs. States his shortness breath is moderate at worst. States he can still get around his house without too much difficulty.     Past Medical History  Diagnosis Date  . Hypertension   . Diabetes mellitus without complication   . Hyperlipidemia   . History of gastroesophageal reflux (GERD)   . CKD (chronic kidney disease), stage III     a. stage III-IV  . Permanent atrial fibrillation     a. Dx 12/2011, Rate-controlled, chronic Xarelto (renal dosing).  . Bell's palsy   . Chronic diastolic CHF (congestive heart failure)     a. 11/2012 Echo: EF 60-65%, mod conc LVH, mildly dil LA/RA, mild Ao sclerosis w/o stenosis; b. stage III-IV    . Anemia of chronic disease     a. plan of oncology to start Procrit - received this during admission 12/2013  . Atrial fibrillation   . Hyperlipidemia   . GERD (gastroesophageal reflux disease)   . Bell's palsy   . Amputated finger     Patient Active Problem List   Diagnosis Date Noted  . Orthostatic hypotension 08/08/2014  . Anemia of chronic disease   . Chronic renal disease 11/26/2012  . Chronic diastolic CHF (congestive heart failure)  11/26/2012  . A-fib 01/15/2012  . Hypertension 01/15/2012  . Hyperlipidemia 01/15/2012  . Diabetes mellitus 01/15/2012    Past Surgical History  Procedure Laterality Date  . Finger surgery      right hand  . Colonoscopy  09/2011  . Esophagogastroduodenoscopy    . Appendectomy      Current Outpatient Rx  Name  Route  Sig  Dispense  Refill  . aspirin 81 MG tablet   Oral   Take 81 mg by mouth daily.         . cloNIDine (CATAPRES) 0.1 MG tablet   Oral   Take 1 tablet (0.1 mg total) by mouth 2 (two) times daily.   60 tablet   11   . docusate sodium (COLACE) 100 MG capsule   Oral   Take 100 mg by mouth 2 (two) times daily.         Marland Kitchen doxazosin (CARDURA) 4 MG tablet   Oral   Take 1 tablet (4 mg total) by mouth at bedtime.   90 tablet   3   . enalapril (VASOTEC) 5 MG tablet   Oral   Take 5 mg by mouth daily.          . Ferrous Sulfate (IRON) 325 (65 FE) MG TABS   Oral   Take by mouth. Take one tablet twice a day.         . hydrALAZINE (APRESOLINE) 100 MG tablet  TAKE ONE TABLET BY MOUTH THREE TIMES DAILY   270 tablet   0   . Insulin Lispro Prot & Lispro (HUMALOG MIX 75/25 [place])   Subcutaneous   Inject 70 Units into the skin 2 (two) times daily.         . metoprolol (LOPRESSOR) 50 MG tablet   Oral   Take 1 tablet (50 mg total) by mouth 2 (two) times daily.   180 tablet   3   . omeprazole (PRILOSEC) 40 MG capsule   Oral   Take 40 mg by mouth daily.         . pravastatin (PRAVACHOL) 40 MG tablet      Take two tablets daily.         . Rivaroxaban (XARELTO) 15 MG TABS tablet   Oral   Take 1 tablet (15 mg total) by mouth daily.   30 tablet   3   . torsemide (DEMADEX) 20 MG tablet      TAKE ONE TABLET BY MOUTH TWICE DAILY AS NEEDED   180 tablet   3   . Vitamin D, Ergocalciferol, (DRISDOL) 50000 UNITS CAPS capsule   Oral   Take 50,000 Units by mouth every 30 (thirty) days.            Allergies No known allergies  Family  History  Problem Relation Age of Onset  . Heart attack Brother   . Hypertension      Social History History  Substance Use Topics  . Smoking status: Former Smoker -- 0.25 packs/day for 10 years    Types: Cigars    Quit date: 05/06/1970  . Smokeless tobacco: Never Used  . Alcohol Use: No    Review of Systems Constitutional: Negative for fever. Cardiovascular: Negative for chest pain. Respiratory: Positive for shortness breath. Gastrointestinal: Negative for abdominal pain Musculoskeletal: Negative for back pain. Neurological: Negative for headache 10-point ROS otherwise negative.  ____________________________________________   PHYSICAL EXAM:  VITAL SIGNS: ED Triage Vitals  Enc Vitals Group     BP 10/05/14 1349 118/54 mmHg     Pulse Rate 10/05/14 1349 69     Resp 10/05/14 1349 25     Temp 10/05/14 1349 97.6 F (36.4 C)     Temp Source 10/05/14 1349 Oral     SpO2 10/05/14 1349 97 %     Weight 10/05/14 1349 185 lb (83.915 kg)     Height 10/05/14 1349 5\' 8"  (1.727 m)     Head Cir --      Peak Flow --      Pain Score --      Pain Loc --      Pain Edu? --      Excl. in [place]? --     Constitutional: Alert and oriented. Well appearing and in no distress. Eyes: Normal exam ENT   Mouth/Throat: Mucous membranes are moist. Cardiovascular: Irregular rhythm, with a normal rate. Respiratory: Normal respiratory effort without tachypnea nor retractions. Decreased breath sounds in the left lung fields. No rales or rhonchi. Gastrointestinal: Soft and nontender. No distention.   Musculoskeletal: Nontender with normal range of motion in all extremities. No lower extremity tenderness or edema. Neurologic:  Normal speech and language. No gross focal neurologic deficits Skin:  Skin is warm, dry and intact.  Psychiatric: Mood and affect are normal. Speech and behavior are normal. ____________________________________________    EKG  EKG reviewed and interpreted by myself shows  atrial fibrillation at 72 bpm, narrow QRS, normal  axis, nonspecific ST changes present. No ST elevations noted.  ____________________________________________    RADIOLOGY  Chest x-ray shows new density versus pleural effusion with consolidation in the left lower chest. Also with right lower chest airspace disease versus edema.  ____________________________________________    INITIAL IMPRESSION / ASSESSMENT AND PLAN / ED COURSE  Pertinent labs & imaging results that were available during my care of the patient were reviewed by me and considered in my medical decision making (see chart for details).  Patient with 3 days of worsening shortness breath, especially with exertion. Chest x-ray consistent with edema versus effusion versus pneumonia. Patient denies any cough, congestion, or fever. On my read the chest x-ray looks most consistent with a pleural effusion, possibly with some mild overlying edema. Patient currently satting 96-97% on room air. We will ambulate the patient and reevaluate oxygen saturation. Currently awaiting lab results including troponin.  Labs are largely within normal limits including negative troponin. Patient denies any cough, fever, congestion. Do not suspect pneumonia clinically. Most consistent with pleural effusion with possible mild pulmonary edema. Patient is maintaining a good oxygen saturation 96-98% on room air, did not drop below 97% when ambulating. I reviewed the patient's notes, he is prescribed torsemide 20 mg twice a day as needed by his cardiologist Dr. Candis Musa. They received 1 dose this morning, but had previously not been taking it. We will have the patient take it for the next 2 days daily, and then every other day for the next 1 week. Patient has nephrology follow-up tomorrow, they are to discuss this plan with the nephrologist. Patient is also to return to the nephrologist on Monday for recheck of his lab work. I discussed this with the patient's family  and they're agreeable to this plan. Also discussed strict return precautions to which they're agreeable.  ____________________________________________   FINAL CLINICAL IMPRESSION(S) / ED DIAGNOSES  Dyspnea Pleural effusion Pulmonary edema   Harvest Dark, MD 10/05/14 1625  Harvest Dark, MD 10/05/14 1625

## 2014-10-05 NOTE — Telephone Encounter (Signed)
Left message for pt to call back  °

## 2014-10-05 NOTE — ED Notes (Signed)
Patient arrives from home with c/o shob intermittently for 2 days. Patient has hx of kidney failure without dialysis. (Patient opted not to have HD).

## 2014-10-06 DIAGNOSIS — R809 Proteinuria, unspecified: Secondary | ICD-10-CM | POA: Diagnosis not present

## 2014-10-06 DIAGNOSIS — N184 Chronic kidney disease, stage 4 (severe): Secondary | ICD-10-CM | POA: Diagnosis not present

## 2014-10-06 DIAGNOSIS — D631 Anemia in chronic kidney disease: Secondary | ICD-10-CM | POA: Diagnosis not present

## 2014-10-06 DIAGNOSIS — E1122 Type 2 diabetes mellitus with diabetic chronic kidney disease: Secondary | ICD-10-CM | POA: Diagnosis not present

## 2014-10-06 DIAGNOSIS — I10 Essential (primary) hypertension: Secondary | ICD-10-CM | POA: Diagnosis not present

## 2014-10-18 ENCOUNTER — Ambulatory Visit: Payer: Commercial Managed Care - HMO | Attending: Family | Admitting: Family

## 2014-10-18 ENCOUNTER — Inpatient Hospital Stay: Payer: Commercial Managed Care - HMO

## 2014-10-18 ENCOUNTER — Inpatient Hospital Stay: Payer: Commercial Managed Care - HMO | Attending: Oncology

## 2014-10-18 ENCOUNTER — Encounter: Payer: Self-pay | Admitting: Family

## 2014-10-18 ENCOUNTER — Ambulatory Visit: Payer: Commercial Managed Care - HMO | Admitting: Family

## 2014-10-18 VITALS — BP 154/80 | HR 70 | Resp 18

## 2014-10-18 VITALS — BP 128/60 | HR 78 | Resp 18 | Ht 68.0 in | Wt 180.0 lb

## 2014-10-18 DIAGNOSIS — N183 Chronic kidney disease, stage 3 (moderate): Secondary | ICD-10-CM | POA: Insufficient documentation

## 2014-10-18 DIAGNOSIS — Z79899 Other long term (current) drug therapy: Secondary | ICD-10-CM | POA: Diagnosis not present

## 2014-10-18 DIAGNOSIS — I480 Paroxysmal atrial fibrillation: Secondary | ICD-10-CM

## 2014-10-18 DIAGNOSIS — K219 Gastro-esophageal reflux disease without esophagitis: Secondary | ICD-10-CM | POA: Insufficient documentation

## 2014-10-18 DIAGNOSIS — Z794 Long term (current) use of insulin: Secondary | ICD-10-CM | POA: Insufficient documentation

## 2014-10-18 DIAGNOSIS — E1159 Type 2 diabetes mellitus with other circulatory complications: Secondary | ICD-10-CM

## 2014-10-18 DIAGNOSIS — I10 Essential (primary) hypertension: Secondary | ICD-10-CM

## 2014-10-18 DIAGNOSIS — I5032 Chronic diastolic (congestive) heart failure: Secondary | ICD-10-CM | POA: Insufficient documentation

## 2014-10-18 DIAGNOSIS — E785 Hyperlipidemia, unspecified: Secondary | ICD-10-CM | POA: Diagnosis not present

## 2014-10-18 DIAGNOSIS — E119 Type 2 diabetes mellitus without complications: Secondary | ICD-10-CM | POA: Diagnosis not present

## 2014-10-18 DIAGNOSIS — D638 Anemia in other chronic diseases classified elsewhere: Secondary | ICD-10-CM | POA: Diagnosis not present

## 2014-10-18 DIAGNOSIS — I129 Hypertensive chronic kidney disease with stage 1 through stage 4 chronic kidney disease, or unspecified chronic kidney disease: Secondary | ICD-10-CM | POA: Insufficient documentation

## 2014-10-18 DIAGNOSIS — Z87891 Personal history of nicotine dependence: Secondary | ICD-10-CM | POA: Insufficient documentation

## 2014-10-18 DIAGNOSIS — I4891 Unspecified atrial fibrillation: Secondary | ICD-10-CM | POA: Insufficient documentation

## 2014-10-18 LAB — HEMOGLOBIN: HEMOGLOBIN: 9.3 g/dL — AB (ref 13.0–18.0)

## 2014-10-18 MED ORDER — EPOETIN ALFA 40000 UNIT/ML IJ SOLN
40000.0000 [IU] | Freq: Once | INTRAMUSCULAR | Status: AC
  Administered 2014-10-18: 40000 [IU] via SUBCUTANEOUS
  Filled 2014-10-18: qty 1

## 2014-10-18 NOTE — Progress Notes (Signed)
Subjective:    Patient ID: Patrick [name], male    DOB: February 23, 1932, 81 y.o.   MRN: [id number]  Congestive Heart Failure Presents for follow-up visit. The disease course has been stable. Associated symptoms include fatigue. Pertinent negatives include no abdominal pain, chest pain, chest pressure, edema, palpitations or shortness of breath. The symptoms have been stable. Past treatments include ACE inhibitors, beta blockers and salt and fluid restriction. The treatment provided significant relief. Compliance with prior treatments has been good. His past medical history is significant for DM and HTN.    Past Medical History  Diagnosis Date  . Hypertension   . Diabetes mellitus without complication   . Hyperlipidemia   . History of gastroesophageal reflux (GERD)   . CKD (chronic kidney disease), stage III     a. stage III-IV  . Permanent atrial fibrillation     a. Dx 12/2011, Rate-controlled, chronic Xarelto (renal dosing).  . Bell's palsy   . Chronic diastolic CHF (congestive heart failure)     a. 11/2012 Echo: EF 60-65%, mod conc LVH, mildly dil LA/RA, mild Ao sclerosis w/o stenosis; b. stage III-IV    . Anemia of chronic disease     a. plan of oncology to start Procrit - received this during admission 12/2013  . Atrial fibrillation   . Hyperlipidemia   . GERD (gastroesophageal reflux disease)   . Bell's palsy   . Amputated finger     Past Surgical History  Procedure Laterality Date  . Finger surgery      right hand  . Colonoscopy  09/2011  . Esophagogastroduodenoscopy    . Appendectomy      Family History  Problem Relation Age of Onset  . Heart attack Brother   . Hypertension      History  Substance Use Topics  . Smoking status: Former Smoker -- 0.25 packs/day for 10 years    Types: Cigars    Quit date: 05/06/1970  . Smokeless tobacco: Never Used  . Alcohol Use: No    Allergies  Allergen Reactions  . No Known Allergies     Prior to Admission  medications   Medication Sig Start Date End Date Taking? Authorizing Provider  glipiZIDE (GLUCOTROL XL) 10 MG 24 hr tablet Take 10 mg by mouth 2 (two) times daily.   Yes Historical Provider, MD  aspirin 81 MG tablet Take 81 mg by mouth daily.    Historical Provider, MD  cloNIDine (CATAPRES) 0.1 MG tablet Take 1 tablet (0.1 mg total) by mouth 2 (two) times daily. 05/10/13   Minna Merritts, MD  docusate sodium (COLACE) 100 MG capsule Take 100 mg by mouth 2 (two) times daily.    Historical Provider, MD  enalapril (VASOTEC) 5 MG tablet Take 5 mg by mouth daily.  07/16/13   Historical Provider, MD  Ferrous Sulfate (IRON) 325 (65 FE) MG TABS Take 1 tablet by mouth daily.    Historical Provider, MD  hydrALAZINE (APRESOLINE) 100 MG tablet TAKE ONE TABLET BY MOUTH THREE TIMES DAILY 07/19/13   Minna Merritts, MD  Insulin Lispro Prot & Lispro (HUMALOG MIX 75/25 [place]) Inject 70 Units into the skin 2 (two) times daily.    Historical Provider, MD  metoprolol (LOPRESSOR) 50 MG tablet Take 1 tablet (50 mg total) by mouth 2 (two) times daily. 02/08/14   Minna Merritts, MD  omeprazole (PRILOSEC) 40 MG capsule Take 40 mg by mouth daily.    Historical Provider, MD  pravastatin (PRAVACHOL) 40  MG tablet Take two tablets daily.    Historical Provider, MD  Rivaroxaban (XARELTO) 15 MG TABS tablet Take 1 tablet (15 mg total) by mouth daily. 05/05/12   Minna Merritts, MD  torsemide (DEMADEX) 20 MG tablet TAKE ONE TABLET BY MOUTH TWICE DAILY AS NEEDED 03/21/14   Minna Merritts, MD     Review of Systems  Constitutional: Positive for appetite change (decreased taste buds) and fatigue.  HENT: Negative for congestion, rhinorrhea and sore throat.   Eyes: Negative.   Respiratory: Negative for cough, chest tightness and shortness of breath.   Cardiovascular: Negative for chest pain, palpitations and leg swelling.  Gastrointestinal: Negative for abdominal pain and abdominal distention.  Endocrine: Negative.    Genitourinary: Negative.   Musculoskeletal: Negative for back pain and neck pain.  Skin: Negative.   Allergic/Immunologic: Negative.   Neurological: Negative for dizziness, light-headedness and headaches.  Hematological: Negative for adenopathy. Bruises/bleeds easily.  Psychiatric/Behavioral: Negative for sleep disturbance (sleeping on 2 pillows) and dysphoric mood.       Objective:   Physical Exam  Constitutional: He is oriented to person, place, and time. He appears well-developed and well-nourished.  HENT:  Head: Normocephalic and atraumatic.  Eyes: Conjunctivae are normal. Pupils are equal, round, and reactive to light.  Neck: Normal range of motion. Neck supple.  Cardiovascular: Normal rate.  An irregular rhythm present.  No murmur heard. Pulmonary/Chest: Effort normal. He has no wheezes. He has no rales.  Abdominal: Soft. He exhibits no distension. There is no tenderness.  Musculoskeletal: He exhibits no edema or tenderness.  Neurological: He is alert and oriented to person, place, and time.  Skin: Skin is warm and dry.  Psychiatric: He has a normal mood and affect. His behavior is normal.  Vitals reviewed.   BP 128/60 mmHg  Pulse 78  Resp 18  Ht 5\' 8"  (1.727 m)  Wt 180 lb (81.647 kg)  BMI 27.38 kg/m2       Assessment & Plan:  1: Chronic heart failure with preserved ejection fraction- Patient presents without any fatigue or shortness of breath unless he has to do too much walking. If he does experience symptoms, he will stop what he's doing to rest until symptoms resolve. He feels like he is sleeping well and wakes up feeling rested. He continues to weigh daily and weight chart was reviewed. Patient continues to lose weight and has lost 11 pounds since he was here last. He says that his taste buds are "gone" so nothing tastes good. He does still monitor his sodium intake, however, and his wife continues to read food labels.  2: HTN- Blood pressure looks good today.  Continue medications at this time. 3: Diabetes- His wife says that his glucose in the morning was 66.  4: Atrial fibrillation- Currently rate controlled at this time. Continues on xarelto with some increased bruising noted.   Return in 3 months or sooner for any questions/problems before then.

## 2014-10-18 NOTE — Patient Instructions (Signed)
Continue weighing daily and call for an overnight weight gain of > 2 pounds or a weekly weight gain of >5 pounds.  Use pepper for seasoning.

## 2014-11-07 DIAGNOSIS — N2581 Secondary hyperparathyroidism of renal origin: Secondary | ICD-10-CM | POA: Diagnosis not present

## 2014-11-07 DIAGNOSIS — N184 Chronic kidney disease, stage 4 (severe): Secondary | ICD-10-CM | POA: Diagnosis not present

## 2014-11-07 DIAGNOSIS — D631 Anemia in chronic kidney disease: Secondary | ICD-10-CM | POA: Diagnosis not present

## 2014-11-07 DIAGNOSIS — I10 Essential (primary) hypertension: Secondary | ICD-10-CM | POA: Diagnosis not present

## 2014-11-07 DIAGNOSIS — R809 Proteinuria, unspecified: Secondary | ICD-10-CM | POA: Diagnosis not present

## 2014-11-15 ENCOUNTER — Inpatient Hospital Stay: Payer: Commercial Managed Care - HMO

## 2014-11-21 ENCOUNTER — Inpatient Hospital Stay: Payer: Commercial Managed Care - HMO

## 2014-11-21 ENCOUNTER — Inpatient Hospital Stay: Payer: Commercial Managed Care - HMO | Attending: Oncology

## 2014-11-21 ENCOUNTER — Other Ambulatory Visit: Payer: Self-pay | Admitting: *Deleted

## 2014-11-21 VITALS — BP 142/84 | HR 98 | Resp 18

## 2014-11-21 DIAGNOSIS — D638 Anemia in other chronic diseases classified elsewhere: Secondary | ICD-10-CM

## 2014-11-21 DIAGNOSIS — Z79899 Other long term (current) drug therapy: Secondary | ICD-10-CM | POA: Diagnosis not present

## 2014-11-21 DIAGNOSIS — N183 Chronic kidney disease, stage 3 (moderate): Secondary | ICD-10-CM | POA: Insufficient documentation

## 2014-11-21 LAB — HEMOGLOBIN: Hemoglobin: 9.8 g/dL — ABNORMAL LOW (ref 13.0–18.0)

## 2014-11-21 MED ORDER — EPOETIN ALFA 40000 UNIT/ML IJ SOLN
40000.0000 [IU] | Freq: Once | INTRAMUSCULAR | Status: AC
  Administered 2014-11-21: 40000 [IU] via SUBCUTANEOUS
  Filled 2014-11-21: qty 1

## 2014-11-22 ENCOUNTER — Ambulatory Visit: Payer: Commercial Managed Care - HMO

## 2014-11-22 ENCOUNTER — Other Ambulatory Visit: Payer: Commercial Managed Care - HMO

## 2014-11-25 DIAGNOSIS — E1122 Type 2 diabetes mellitus with diabetic chronic kidney disease: Secondary | ICD-10-CM | POA: Diagnosis not present

## 2014-11-25 DIAGNOSIS — N184 Chronic kidney disease, stage 4 (severe): Secondary | ICD-10-CM | POA: Diagnosis not present

## 2014-12-05 DIAGNOSIS — R634 Abnormal weight loss: Secondary | ICD-10-CM | POA: Diagnosis not present

## 2014-12-05 DIAGNOSIS — N184 Chronic kidney disease, stage 4 (severe): Secondary | ICD-10-CM | POA: Diagnosis not present

## 2014-12-05 DIAGNOSIS — I10 Essential (primary) hypertension: Secondary | ICD-10-CM | POA: Diagnosis not present

## 2014-12-13 ENCOUNTER — Other Ambulatory Visit: Payer: Commercial Managed Care - HMO

## 2014-12-13 ENCOUNTER — Ambulatory Visit: Payer: Commercial Managed Care - HMO | Admitting: Oncology

## 2014-12-13 ENCOUNTER — Ambulatory Visit: Payer: Commercial Managed Care - HMO

## 2014-12-19 DIAGNOSIS — E1122 Type 2 diabetes mellitus with diabetic chronic kidney disease: Secondary | ICD-10-CM | POA: Diagnosis not present

## 2014-12-19 DIAGNOSIS — N184 Chronic kidney disease, stage 4 (severe): Secondary | ICD-10-CM | POA: Diagnosis not present

## 2014-12-19 DIAGNOSIS — R634 Abnormal weight loss: Secondary | ICD-10-CM | POA: Diagnosis not present

## 2014-12-19 DIAGNOSIS — Z794 Long term (current) use of insulin: Secondary | ICD-10-CM | POA: Diagnosis not present

## 2014-12-20 ENCOUNTER — Inpatient Hospital Stay: Payer: Commercial Managed Care - HMO | Admitting: Oncology

## 2014-12-20 ENCOUNTER — Inpatient Hospital Stay: Payer: Commercial Managed Care - HMO

## 2014-12-20 DIAGNOSIS — N184 Chronic kidney disease, stage 4 (severe): Secondary | ICD-10-CM | POA: Diagnosis not present

## 2014-12-20 DIAGNOSIS — N2581 Secondary hyperparathyroidism of renal origin: Secondary | ICD-10-CM | POA: Diagnosis not present

## 2014-12-20 DIAGNOSIS — R809 Proteinuria, unspecified: Secondary | ICD-10-CM | POA: Diagnosis not present

## 2014-12-20 DIAGNOSIS — I10 Essential (primary) hypertension: Secondary | ICD-10-CM | POA: Diagnosis not present

## 2014-12-20 DIAGNOSIS — D631 Anemia in chronic kidney disease: Secondary | ICD-10-CM | POA: Diagnosis not present

## 2014-12-22 ENCOUNTER — Inpatient Hospital Stay
Admission: EM | Admit: 2014-12-22 | Discharge: 2014-12-26 | DRG: 291 | Disposition: A | Discharge status: Home / Self Care | Payer: Commercial Managed Care - HMO | Attending: Internal Medicine | Admitting: Internal Medicine

## 2014-12-22 ENCOUNTER — Encounter: Payer: Self-pay | Admitting: Radiology

## 2014-12-22 ENCOUNTER — Emergency Department: Payer: Commercial Managed Care - HMO

## 2014-12-22 DIAGNOSIS — K219 Gastro-esophageal reflux disease without esophagitis: Secondary | ICD-10-CM | POA: Diagnosis present

## 2014-12-22 DIAGNOSIS — R0600 Dyspnea, unspecified: Secondary | ICD-10-CM

## 2014-12-22 DIAGNOSIS — Z9889 Other specified postprocedural states: Secondary | ICD-10-CM

## 2014-12-22 DIAGNOSIS — Z79899 Other long term (current) drug therapy: Secondary | ICD-10-CM

## 2014-12-22 DIAGNOSIS — Z833 Family history of diabetes mellitus: Secondary | ICD-10-CM

## 2014-12-22 DIAGNOSIS — I429 Cardiomyopathy, unspecified: Secondary | ICD-10-CM | POA: Diagnosis present

## 2014-12-22 DIAGNOSIS — I482 Chronic atrial fibrillation: Secondary | ICD-10-CM | POA: Diagnosis present

## 2014-12-22 DIAGNOSIS — D62 Acute posthemorrhagic anemia: Secondary | ICD-10-CM | POA: Insufficient documentation

## 2014-12-22 DIAGNOSIS — Z7901 Long term (current) use of anticoagulants: Secondary | ICD-10-CM | POA: Diagnosis not present

## 2014-12-22 DIAGNOSIS — N390 Urinary tract infection, site not specified: Secondary | ICD-10-CM | POA: Diagnosis present

## 2014-12-22 DIAGNOSIS — J9811 Atelectasis: Secondary | ICD-10-CM | POA: Diagnosis present

## 2014-12-22 DIAGNOSIS — I200 Unstable angina: Secondary | ICD-10-CM | POA: Diagnosis not present

## 2014-12-22 DIAGNOSIS — F0390 Unspecified dementia without behavioral disturbance: Secondary | ICD-10-CM | POA: Diagnosis present

## 2014-12-22 DIAGNOSIS — J90 Pleural effusion, not elsewhere classified: Secondary | ICD-10-CM | POA: Diagnosis present

## 2014-12-22 DIAGNOSIS — R0789 Other chest pain: Secondary | ICD-10-CM | POA: Diagnosis not present

## 2014-12-22 DIAGNOSIS — I25110 Atherosclerotic heart disease of native coronary artery with unstable angina pectoris: Secondary | ICD-10-CM | POA: Diagnosis present

## 2014-12-22 DIAGNOSIS — R079 Chest pain, unspecified: Secondary | ICD-10-CM | POA: Diagnosis present

## 2014-12-22 DIAGNOSIS — J9621 Acute and chronic respiratory failure with hypoxia: Secondary | ICD-10-CM | POA: Diagnosis present

## 2014-12-22 DIAGNOSIS — G510 Bell's palsy: Secondary | ICD-10-CM | POA: Diagnosis present

## 2014-12-22 DIAGNOSIS — Z794 Long term (current) use of insulin: Secondary | ICD-10-CM

## 2014-12-22 DIAGNOSIS — Z87891 Personal history of nicotine dependence: Secondary | ICD-10-CM | POA: Diagnosis not present

## 2014-12-22 DIAGNOSIS — Z7982 Long term (current) use of aspirin: Secondary | ICD-10-CM | POA: Diagnosis not present

## 2014-12-22 DIAGNOSIS — Z8249 Family history of ischemic heart disease and other diseases of the circulatory system: Secondary | ICD-10-CM

## 2014-12-22 DIAGNOSIS — N170 Acute kidney failure with tubular necrosis: Secondary | ICD-10-CM | POA: Diagnosis present

## 2014-12-22 DIAGNOSIS — I4819 Other persistent atrial fibrillation: Secondary | ICD-10-CM | POA: Diagnosis present

## 2014-12-22 DIAGNOSIS — E1159 Type 2 diabetes mellitus with other circulatory complications: Secondary | ICD-10-CM | POA: Diagnosis not present

## 2014-12-22 DIAGNOSIS — I10 Essential (primary) hypertension: Secondary | ICD-10-CM | POA: Diagnosis not present

## 2014-12-22 DIAGNOSIS — I130 Hypertensive heart and chronic kidney disease with heart failure and stage 1 through stage 4 chronic kidney disease, or unspecified chronic kidney disease: Secondary | ICD-10-CM | POA: Diagnosis present

## 2014-12-22 DIAGNOSIS — E119 Type 2 diabetes mellitus without complications: Secondary | ICD-10-CM | POA: Diagnosis not present

## 2014-12-22 DIAGNOSIS — I4892 Unspecified atrial flutter: Secondary | ICD-10-CM | POA: Diagnosis present

## 2014-12-22 DIAGNOSIS — D638 Anemia in other chronic diseases classified elsewhere: Secondary | ICD-10-CM | POA: Diagnosis present

## 2014-12-22 DIAGNOSIS — E041 Nontoxic single thyroid nodule: Secondary | ICD-10-CM | POA: Diagnosis present

## 2014-12-22 DIAGNOSIS — I5043 Acute on chronic combined systolic (congestive) and diastolic (congestive) heart failure: Principal | ICD-10-CM | POA: Diagnosis present

## 2014-12-22 DIAGNOSIS — E1122 Type 2 diabetes mellitus with diabetic chronic kidney disease: Secondary | ICD-10-CM | POA: Diagnosis present

## 2014-12-22 DIAGNOSIS — Z95828 Presence of other vascular implants and grafts: Secondary | ICD-10-CM

## 2014-12-22 DIAGNOSIS — N184 Chronic kidney disease, stage 4 (severe): Secondary | ICD-10-CM | POA: Diagnosis present

## 2014-12-22 DIAGNOSIS — I959 Hypotension, unspecified: Secondary | ICD-10-CM | POA: Diagnosis present

## 2014-12-22 DIAGNOSIS — J9690 Respiratory failure, unspecified, unspecified whether with hypoxia or hypercapnia: Secondary | ICD-10-CM | POA: Diagnosis not present

## 2014-12-22 DIAGNOSIS — J948 Other specified pleural conditions: Secondary | ICD-10-CM | POA: Diagnosis not present

## 2014-12-22 DIAGNOSIS — N189 Chronic kidney disease, unspecified: Secondary | ICD-10-CM | POA: Diagnosis present

## 2014-12-22 DIAGNOSIS — N179 Acute kidney failure, unspecified: Secondary | ICD-10-CM | POA: Diagnosis present

## 2014-12-22 DIAGNOSIS — R0602 Shortness of breath: Secondary | ICD-10-CM | POA: Diagnosis not present

## 2014-12-22 DIAGNOSIS — Z452 Encounter for adjustment and management of vascular access device: Secondary | ICD-10-CM | POA: Diagnosis not present

## 2014-12-22 DIAGNOSIS — N183 Chronic kidney disease, stage 3 (moderate): Secondary | ICD-10-CM | POA: Diagnosis not present

## 2014-12-22 LAB — CBC WITH DIFFERENTIAL/PLATELET
BASOS ABS: 0.1 10*3/uL (ref 0–0.1)
BASOS PCT: 1 %
EOS ABS: 0.1 10*3/uL (ref 0–0.7)
Eosinophils Relative: 1 %
HCT: 33.9 % — ABNORMAL LOW (ref 40.0–52.0)
HEMOGLOBIN: 11.3 g/dL — AB (ref 13.0–18.0)
Lymphocytes Relative: 8 %
Lymphs Abs: 0.7 10*3/uL — ABNORMAL LOW (ref 1.0–3.6)
MCH: 27.8 pg (ref 26.0–34.0)
MCHC: 33.4 g/dL (ref 32.0–36.0)
MCV: 83.1 fL (ref 80.0–100.0)
Monocytes Absolute: 0.4 10*3/uL (ref 0.2–1.0)
Monocytes Relative: 5 %
NEUTROS PCT: 85 %
Neutro Abs: 6.8 10*3/uL — ABNORMAL HIGH (ref 1.4–6.5)
Platelets: 354 10*3/uL (ref 150–440)
RBC: 4.08 MIL/uL — AB (ref 4.40–5.90)
RDW: 16.2 % — ABNORMAL HIGH (ref 11.5–14.5)
WBC: 8 10*3/uL (ref 3.8–10.6)

## 2014-12-22 LAB — BASIC METABOLIC PANEL
ANION GAP: 8 (ref 5–15)
BUN: 32 mg/dL — ABNORMAL HIGH (ref 6–20)
CHLORIDE: 100 mmol/L — AB (ref 101–111)
CO2: 28 mmol/L (ref 22–32)
CREATININE: 2 mg/dL — AB (ref 0.61–1.24)
Calcium: 9.1 mg/dL (ref 8.9–10.3)
GFR calc non Af Amer: 30 mL/min — ABNORMAL LOW (ref >60)
GFR, EST AFRICAN AMERICAN: 34 mL/min — AB (ref >60)
Glucose, Bld: 232 mg/dL — ABNORMAL HIGH (ref 65–99)
POTASSIUM: 3.9 mmol/L (ref 3.5–5.1)
SODIUM: 136 mmol/L (ref 135–145)

## 2014-12-22 LAB — TROPONIN I: Troponin I: 0.03 ng/mL (ref <0.031)

## 2014-12-22 MED ORDER — IOHEXOL 350 MG/ML SOLN
70.0000 mL | Freq: Once | INTRAVENOUS | Status: AC | PRN
  Administered 2014-12-22: 70 mL via INTRAVENOUS

## 2014-12-22 NOTE — ED Notes (Signed)
PT IN WITH CO CHEST PAIN SINCE 1900 HX OF AFIB

## 2014-12-22 NOTE — ED Provider Notes (Signed)
[facility] Emergency Department Provider Note    ____________________________________________  Time seen: 2220  I have reviewed the triage vital signs and the nursing notes.   HISTORY  Chief Complaint Chest Pain   History limited by: Not Limited   HPI Patrick [name] is a 81 y.o. male who presents to the emergency department today with concerns for sharp chest pain. He states it is located substernally. The patient states that it started roughly 3-1/2 hours ago. It has been constant since then. It does become severe. He rates the pain currently out of 10 out of 10. The patient denies any radiation of the pain to his neck or shoulders. He denies any radiation to his back. He denies any shortness of breath however his family states they do think he is more short of breath. He denies any recent fevers.     Past Medical History  Diagnosis Date  . Hypertension   . Diabetes mellitus without complication   . Hyperlipidemia   . History of gastroesophageal reflux (GERD)   . CKD (chronic kidney disease), stage III     a. stage III-IV  . Permanent atrial fibrillation     a. Dx 12/2011, Rate-controlled, chronic Xarelto (renal dosing).  . Bell's palsy   . Chronic diastolic CHF (congestive heart failure)     a. 11/2012 Echo: EF 60-65%, mod conc LVH, mildly dil LA/RA, mild Ao sclerosis w/o stenosis; b. stage III-IV    . Anemia of chronic disease     a. plan of oncology to start Procrit - received this during admission 12/2013  . Atrial fibrillation   . Hyperlipidemia   . GERD (gastroesophageal reflux disease)   . Bell's palsy   . Amputated finger     Patient Active Problem List   Diagnosis Date Noted  . Orthostatic hypotension 08/08/2014  . Anemia of chronic disease   . Chronic renal disease 11/26/2012  . Chronic diastolic CHF (congestive heart failure) 11/26/2012  . A-fib 01/15/2012  . Hypertension 01/15/2012  . Hyperlipidemia 01/15/2012  .  Diabetes mellitus 01/15/2012    Past Surgical History  Procedure Laterality Date  . Finger surgery      right hand  . Colonoscopy  09/2011  . Esophagogastroduodenoscopy    . Appendectomy      Current Outpatient Rx  Name  Route  Sig  Dispense  Refill  . aspirin 81 MG tablet   Oral   Take 81 mg by mouth daily.         . cloNIDine (CATAPRES) 0.1 MG tablet   Oral   Take 1 tablet (0.1 mg total) by mouth 2 (two) times daily.   60 tablet   11   . docusate sodium (COLACE) 100 MG capsule   Oral   Take 100 mg by mouth 2 (two) times daily.         . enalapril (VASOTEC) 5 MG tablet   Oral   Take 5 mg by mouth daily.          . Ferrous Sulfate (IRON) 325 (65 FE) MG TABS   Oral   Take 1 tablet by mouth daily.         Marland Kitchen glipiZIDE (GLUCOTROL XL) 10 MG 24 hr tablet   Oral   Take 10 mg by mouth 2 (two) times daily.         . hydrALAZINE (APRESOLINE) 100 MG tablet      TAKE ONE TABLET BY MOUTH THREE TIMES DAILY  270 tablet   0   . Insulin Lispro Prot & Lispro (HUMALOG MIX 75/25 [place])   Subcutaneous   Inject 70 Units into the skin 2 (two) times daily.         . metoprolol (LOPRESSOR) 50 MG tablet   Oral   Take 1 tablet (50 mg total) by mouth 2 (two) times daily.   180 tablet   3   . omeprazole (PRILOSEC) 40 MG capsule   Oral   Take 40 mg by mouth daily.         . pravastatin (PRAVACHOL) 40 MG tablet      Take two tablets daily.         . Rivaroxaban (XARELTO) 15 MG TABS tablet   Oral   Take 1 tablet (15 mg total) by mouth daily.   30 tablet   3   . torsemide (DEMADEX) 20 MG tablet      TAKE ONE TABLET BY MOUTH TWICE DAILY AS NEEDED   180 tablet   3     Allergies No known allergies  Family History  Problem Relation Age of Onset  . Heart attack Brother   . Hypertension      Social History Social History  Substance Use Topics  . Smoking status: Former Smoker -- 0.25 packs/day for 10 years    Types: Cigars    Quit date: 05/06/1970  .  Smokeless tobacco: Never Used  . Alcohol Use: No    Review of Systems  Constitutional: Negative for fever. Cardiovascular: Positive for chest pain. Respiratory: Negative for shortness of breath. Gastrointestinal: Negative for abdominal pain, vomiting and diarrhea. Genitourinary: Negative for dysuria. Musculoskeletal: Negative for back pain. Skin: Negative for rash. Neurological: Negative for headaches, focal weakness or numbness.  10-point ROS otherwise negative.  ____________________________________________   PHYSICAL EXAM:  VITAL SIGNS: ED Triage Vitals  Enc Vitals Group     BP 12/22/14 2222 186/82 mmHg     Pulse Rate 12/22/14 2222 112     Resp 12/22/14 2222 18     Temp 12/22/14 2222 98.2 F (36.8 C)     Temp Source 12/22/14 2222 Oral     SpO2 12/22/14 2222 95 %     Weight 12/22/14 2220 166 lb (75.297 kg)     Height --      Head Cir --      Peak Flow --    Constitutional: Alert and oriented. Appears in mild distress Eyes: Conjunctivae are normal. PERRL. Normal extraocular movements. ENT   Head: Normocephalic and atraumatic.   Nose: No congestion/rhinnorhea.   Mouth/Throat: Mucous membranes are moist.   Neck: No stridor. Hematological/Lymphatic/Immunilogical: No cervical lymphadenopathy. Cardiovascular: Normal rate, regular rhythm.  No murmurs, rubs, or gallops. Respiratory: Normal respiratory effort without tachypnea nor retractions. Breath sounds are clear and equal bilaterally. No wheezes/rales/rhonchi. Gastrointestinal: Soft and nontender. No distention.  Genitourinary: Deferred Musculoskeletal: Normal range of motion in all extremities. No joint effusions.  No lower extremity tenderness nor edema. Neurologic:  Normal speech and language. No gross focal neurologic deficits are appreciated. Speech is normal.  Skin:  Skin is warm, dry and intact. No rash noted. Psychiatric: Mood and affect are normal. Speech and behavior are normal. Patient exhibits  appropriate insight and judgment.  ____________________________________________    LABS (pertinent positives/negatives)  Labs Reviewed  CBC WITH DIFFERENTIAL/PLATELET - Abnormal; Notable for the following:    RBC 4.08 (*)    Hemoglobin 11.3 (*)    HCT 33.9 (*)  RDW 16.2 (*)    Neutro Abs 6.8 (*)    Lymphs Abs 0.7 (*)    All other components within normal limits  BASIC METABOLIC PANEL - Abnormal; Notable for the following:    Chloride 100 (*)    Glucose, Bld 232 (*)    BUN 32 (*)    Creatinine, Ser 2.00 (*)    GFR calc non Af Amer 30 (*)    GFR calc Af Amer 34 (*)    All other components within normal limits  TROPONIN I     ____________________________________________   EKG  I, Nance Pear, attending physician, personally viewed and interpreted this EKG  EKG Time: 2226 Rate: 117 Rhythm: afib with RVR Axis: normal Intervals: qtc 440 QRS: narrow ST changes: no st elevation Impression: abnormal ekg   ____________________________________________    RADIOLOGY  CT angio  IMPRESSION: 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 No convincing evidence of acute vascular process.  Moderate LEFT, small RIGHT pleural effusions. Enhancing atelectasis. 14 mm lingular nodular density could represent parenchymal nodule or, round atelectasis.  Mediastinal lymphadenopathy, though this may be reactive, recommend follow-up.  17 mm LEFT thyroid nodule for which follow up thyroid sonogram is recommended on a nonemergent basis.   ____________________________________________   PROCEDURES  Procedure(s) performed: None  Critical Care performed: No  ____________________________________________   INITIAL IMPRESSION / ASSESSMENT AND PLAN / ED COURSE  Pertinent labs & imaging results that were available during my care of the patient were reviewed by me and considered in my medical decision making (see chart for details).  Patient presented to the  emergency department today with concerns for sharp retrosternal chest pain. On exam initially patient was tachycardic and hypertensive and I was concerned for a dissection. CT angiogram dissection did not show any aortic dissection however there is a moderate sized pleural effusion on the left. Patient does have risk factors for ACS however EKG and first troponin without signs of any acute heart injury. Will plan on admission to the hospital for further management and workup.  ____________________________________________   FINAL CLINICAL IMPRESSION(S) / ED DIAGNOSES  Chest pain Pleural Effusion  Nance Pear, MD 12/23/14 0000

## 2014-12-22 NOTE — ED Notes (Signed)
Patient transported to CT 

## 2014-12-23 ENCOUNTER — Inpatient Hospital Stay: Payer: Commercial Managed Care - HMO

## 2014-12-23 ENCOUNTER — Encounter: Payer: Self-pay | Admitting: Internal Medicine

## 2014-12-23 ENCOUNTER — Inpatient Hospital Stay (HOSPITAL_COMMUNITY)
Admit: 2014-12-23 | Discharge: 2014-12-23 | Disposition: A | Discharge status: Home / Self Care | Payer: Commercial Managed Care - HMO | Attending: Internal Medicine | Admitting: Internal Medicine

## 2014-12-23 DIAGNOSIS — D638 Anemia in other chronic diseases classified elsewhere: Secondary | ICD-10-CM | POA: Diagnosis present

## 2014-12-23 DIAGNOSIS — N390 Urinary tract infection, site not specified: Secondary | ICD-10-CM | POA: Diagnosis present

## 2014-12-23 DIAGNOSIS — N170 Acute kidney failure with tubular necrosis: Secondary | ICD-10-CM | POA: Diagnosis present

## 2014-12-23 DIAGNOSIS — I4892 Unspecified atrial flutter: Secondary | ICD-10-CM | POA: Diagnosis present

## 2014-12-23 DIAGNOSIS — I959 Hypotension, unspecified: Secondary | ICD-10-CM | POA: Diagnosis present

## 2014-12-23 DIAGNOSIS — J90 Pleural effusion, not elsewhere classified: Secondary | ICD-10-CM | POA: Diagnosis present

## 2014-12-23 DIAGNOSIS — N184 Chronic kidney disease, stage 4 (severe): Secondary | ICD-10-CM | POA: Diagnosis present

## 2014-12-23 DIAGNOSIS — Z79899 Other long term (current) drug therapy: Secondary | ICD-10-CM | POA: Diagnosis not present

## 2014-12-23 DIAGNOSIS — I25110 Atherosclerotic heart disease of native coronary artery with unstable angina pectoris: Secondary | ICD-10-CM | POA: Diagnosis present

## 2014-12-23 DIAGNOSIS — Z9889 Other specified postprocedural states: Secondary | ICD-10-CM | POA: Diagnosis not present

## 2014-12-23 DIAGNOSIS — N189 Chronic kidney disease, unspecified: Secondary | ICD-10-CM

## 2014-12-23 DIAGNOSIS — I482 Chronic atrial fibrillation: Secondary | ICD-10-CM

## 2014-12-23 DIAGNOSIS — R0789 Other chest pain: Secondary | ICD-10-CM

## 2014-12-23 DIAGNOSIS — N179 Acute kidney failure, unspecified: Secondary | ICD-10-CM | POA: Diagnosis present

## 2014-12-23 DIAGNOSIS — Z7982 Long term (current) use of aspirin: Secondary | ICD-10-CM | POA: Diagnosis not present

## 2014-12-23 DIAGNOSIS — E1159 Type 2 diabetes mellitus with other circulatory complications: Secondary | ICD-10-CM | POA: Diagnosis not present

## 2014-12-23 DIAGNOSIS — N183 Chronic kidney disease, stage 3 (moderate): Secondary | ICD-10-CM

## 2014-12-23 DIAGNOSIS — J948 Other specified pleural conditions: Secondary | ICD-10-CM | POA: Diagnosis not present

## 2014-12-23 DIAGNOSIS — I200 Unstable angina: Secondary | ICD-10-CM | POA: Diagnosis not present

## 2014-12-23 DIAGNOSIS — F0390 Unspecified dementia without behavioral disturbance: Secondary | ICD-10-CM | POA: Diagnosis present

## 2014-12-23 DIAGNOSIS — E1122 Type 2 diabetes mellitus with diabetic chronic kidney disease: Secondary | ICD-10-CM | POA: Diagnosis present

## 2014-12-23 DIAGNOSIS — R079 Chest pain, unspecified: Secondary | ICD-10-CM | POA: Diagnosis not present

## 2014-12-23 DIAGNOSIS — J9621 Acute and chronic respiratory failure with hypoxia: Secondary | ICD-10-CM | POA: Insufficient documentation

## 2014-12-23 DIAGNOSIS — Z794 Long term (current) use of insulin: Secondary | ICD-10-CM | POA: Diagnosis not present

## 2014-12-23 DIAGNOSIS — Z7901 Long term (current) use of anticoagulants: Secondary | ICD-10-CM | POA: Diagnosis not present

## 2014-12-23 DIAGNOSIS — J9811 Atelectasis: Secondary | ICD-10-CM | POA: Diagnosis present

## 2014-12-23 DIAGNOSIS — I130 Hypertensive heart and chronic kidney disease with heart failure and stage 1 through stage 4 chronic kidney disease, or unspecified chronic kidney disease: Secondary | ICD-10-CM | POA: Diagnosis present

## 2014-12-23 DIAGNOSIS — Z87891 Personal history of nicotine dependence: Secondary | ICD-10-CM | POA: Diagnosis not present

## 2014-12-23 DIAGNOSIS — K219 Gastro-esophageal reflux disease without esophagitis: Secondary | ICD-10-CM | POA: Diagnosis present

## 2014-12-23 DIAGNOSIS — I10 Essential (primary) hypertension: Secondary | ICD-10-CM

## 2014-12-23 DIAGNOSIS — E041 Nontoxic single thyroid nodule: Secondary | ICD-10-CM | POA: Diagnosis present

## 2014-12-23 DIAGNOSIS — I5043 Acute on chronic combined systolic (congestive) and diastolic (congestive) heart failure: Principal | ICD-10-CM

## 2014-12-23 DIAGNOSIS — G510 Bell's palsy: Secondary | ICD-10-CM | POA: Diagnosis present

## 2014-12-23 DIAGNOSIS — I429 Cardiomyopathy, unspecified: Secondary | ICD-10-CM | POA: Diagnosis present

## 2014-12-23 DIAGNOSIS — Z833 Family history of diabetes mellitus: Secondary | ICD-10-CM | POA: Diagnosis not present

## 2014-12-23 DIAGNOSIS — D62 Acute posthemorrhagic anemia: Secondary | ICD-10-CM | POA: Diagnosis present

## 2014-12-23 DIAGNOSIS — Z8249 Family history of ischemic heart disease and other diseases of the circulatory system: Secondary | ICD-10-CM | POA: Diagnosis not present

## 2014-12-23 LAB — BLOOD GAS, ARTERIAL
Acid-Base Excess: 3.7 mmol/L — ABNORMAL HIGH (ref 0.0–3.0)
Allens test (pass/fail): POSITIVE — AB
Bicarbonate: 27.7 mEq/L (ref 21.0–28.0)
FIO2: 0.21
O2 SAT: 90.4 %
PCO2 ART: 39 mmHg (ref 32.0–48.0)
PH ART: 7.46 — AB (ref 7.350–7.450)
PO2 ART: 56 mmHg — AB (ref 83.0–108.0)
Patient temperature: 37

## 2014-12-23 LAB — TROPONIN I
TROPONIN I: 0.16 ng/mL — AB (ref <0.031)
Troponin I: 0.03 ng/mL (ref <0.031)
Troponin I: 0.03 ng/mL (ref <0.031)
Troponin I: 0.03 ng/mL (ref <0.031)

## 2014-12-23 LAB — GLUCOSE, CAPILLARY
GLUCOSE-CAPILLARY: 156 mg/dL — AB (ref 65–99)
GLUCOSE-CAPILLARY: 79 mg/dL (ref 65–99)
GLUCOSE-CAPILLARY: 120 mg/dL — AB (ref 65–99)
Glucose-Capillary: 159 mg/dL — ABNORMAL HIGH (ref 65–99)
Glucose-Capillary: 108 mg/dL — ABNORMAL HIGH (ref 65–99)
Glucose-Capillary: 47 mg/dL — ABNORMAL LOW (ref 65–99)
Glucose-Capillary: 50 mg/dL — ABNORMAL LOW (ref 65–99)

## 2014-12-23 LAB — MRSA PCR SCREENING: MRSA BY PCR: NEGATIVE

## 2014-12-23 MED ORDER — PRAVASTATIN SODIUM 20 MG PO TABS
40.0000 mg | ORAL_TABLET | Freq: Every day | ORAL | Status: DC
  Administered 2014-12-23 – 2014-12-26 (×4): 40 mg via ORAL
  Filled 2014-12-23 (×4): qty 2

## 2014-12-23 MED ORDER — SODIUM CHLORIDE 0.9 % IV SOLN
INTRAVENOUS | Status: DC
  Administered 2014-12-23 – 2014-12-24 (×3): via INTRAVENOUS

## 2014-12-23 MED ORDER — FERROUS SULFATE 325 (65 FE) MG PO TABS
325.0000 mg | ORAL_TABLET | Freq: Every day | ORAL | Status: DC
  Administered 2014-12-23 – 2014-12-26 (×4): 325 mg via ORAL
  Filled 2014-12-23 (×4): qty 1

## 2014-12-23 MED ORDER — SODIUM CHLORIDE 0.9 % IJ SOLN: 3.0000 mL | Freq: Two times a day (BID) | INTRAMUSCULAR | Status: DC

## 2014-12-23 MED ORDER — INSULIN LISPRO PROT & LISPRO (75-25 MIX) 100 UNIT/ML ~~LOC~~ SUSP: 50.0000 [IU] | Freq: Two times a day (BID) | SUBCUTANEOUS | Status: DC

## 2014-12-23 MED ORDER — SODIUM CHLORIDE 0.9 % IV BOLUS (SEPSIS): 500.0000 mL | Freq: Once | INTRAVENOUS | Status: AC

## 2014-12-23 MED ORDER — DOCUSATE SODIUM 100 MG PO CAPS
100.0000 mg | ORAL_CAPSULE | Freq: Two times a day (BID) | ORAL | Status: DC
  Administered 2014-12-23 – 2014-12-26 (×6): 100 mg via ORAL
  Filled 2014-12-23 (×6): qty 1

## 2014-12-23 MED ORDER — INSULIN ASPART 100 UNIT/ML ~~LOC~~ SOLN
0.0000 [IU] | Freq: Three times a day (TID) | SUBCUTANEOUS | Status: DC
  Administered 2014-12-23 – 2014-12-24 (×2): 3 [IU] via SUBCUTANEOUS
  Administered 2014-12-24: 2 [IU] via SUBCUTANEOUS
  Administered 2014-12-25: 3 [IU] via SUBCUTANEOUS
  Filled 2014-12-23 (×3): qty 3
  Filled 2014-12-23: qty 2

## 2014-12-23 MED ORDER — CLONIDINE HCL 0.1 MG PO TABS
0.1000 mg | ORAL_TABLET | Freq: Two times a day (BID) | ORAL | Status: DC
  Administered 2014-12-23 (×2): 0.1 mg via ORAL
  Filled 2014-12-23 (×2): qty 1

## 2014-12-23 MED ORDER — HYDRALAZINE HCL 50 MG PO TABS
100.0000 mg | ORAL_TABLET | Freq: Three times a day (TID) | ORAL | Status: DC
  Administered 2014-12-23: 100 mg via ORAL
  Filled 2014-12-23: qty 2

## 2014-12-23 MED ORDER — HYDRALAZINE HCL 50 MG PO TABS: 50.0000 mg | ORAL_TABLET | Freq: Three times a day (TID) | ORAL | Status: DC

## 2014-12-23 MED ORDER — ASPIRIN EC 81 MG PO TBEC: 81.0000 mg | DELAYED_RELEASE_TABLET | Freq: Every day | ORAL | Status: DC

## 2014-12-23 MED ORDER — ONDANSETRON HCL 4 MG PO TABS: 4.0000 mg | ORAL_TABLET | Freq: Four times a day (QID) | ORAL | Status: DC | PRN

## 2014-12-23 MED ORDER — ACETAMINOPHEN 650 MG RE SUPP: 650.0000 mg | Freq: Four times a day (QID) | RECTAL | Status: DC | PRN

## 2014-12-23 MED ORDER — INSULIN ASPART PROT & ASPART (70-30 MIX) 100 UNIT/ML ~~LOC~~ SUSP
50.0000 [IU] | Freq: Two times a day (BID) | SUBCUTANEOUS | Status: DC
  Administered 2014-12-23: 50 [IU] via SUBCUTANEOUS
  Filled 2014-12-23: qty 50

## 2014-12-23 MED ORDER — GLIPIZIDE ER 10 MG PO TB24
10.0000 mg | ORAL_TABLET | Freq: Two times a day (BID) | ORAL | Status: DC
  Administered 2014-12-23 – 2014-12-25 (×4): 10 mg via ORAL
  Filled 2014-12-23 (×2): qty 4
  Filled 2014-12-23: qty 2
  Filled 2014-12-23: qty 1
  Filled 2014-12-23: qty 4
  Filled 2014-12-23: qty 1

## 2014-12-23 MED ORDER — MORPHINE SULFATE (PF) 2 MG/ML IV SOLN
2.0000 mg | Freq: Once | INTRAVENOUS | Status: AC
  Administered 2014-12-23: 2 mg via INTRAVENOUS
  Filled 2014-12-23: qty 1

## 2014-12-23 MED ORDER — ASPIRIN EC 81 MG PO TBEC
81.0000 mg | DELAYED_RELEASE_TABLET | Freq: Every day | ORAL | Status: DC
  Administered 2014-12-23 – 2014-12-26 (×4): 81 mg via ORAL
  Filled 2014-12-23 (×4): qty 1

## 2014-12-23 MED ORDER — ACETAMINOPHEN 325 MG PO TABS: 650.0000 mg | ORAL_TABLET | Freq: Four times a day (QID) | ORAL | Status: DC | PRN

## 2014-12-23 MED ORDER — ONDANSETRON HCL 4 MG/2ML IJ SOLN: 4.0000 mg | Freq: Four times a day (QID) | INTRAMUSCULAR | Status: DC | PRN

## 2014-12-23 MED ORDER — PANTOPRAZOLE SODIUM 40 MG PO TBEC
40.0000 mg | DELAYED_RELEASE_TABLET | Freq: Every day | ORAL | Status: DC
  Administered 2014-12-23 – 2014-12-26 (×4): 40 mg via ORAL
  Filled 2014-12-23 (×4): qty 1

## 2014-12-23 MED ORDER — NEPRO/CARBSTEADY PO LIQD
237.0000 mL | Freq: Three times a day (TID) | ORAL | Status: DC
  Administered 2014-12-24 – 2014-12-26 (×6): 237 mL via ORAL

## 2014-12-23 MED ORDER — SODIUM CHLORIDE 0.9 % IV SOLN: 250.0000 mL | INTRAVENOUS | Status: DC | PRN

## 2014-12-23 MED ORDER — ENALAPRIL MALEATE 5 MG PO TABS
5.0000 mg | ORAL_TABLET | Freq: Every day | ORAL | Status: DC
  Administered 2014-12-23 – 2014-12-24 (×2): 5 mg via ORAL
  Filled 2014-12-23 (×2): qty 1

## 2014-12-23 MED ORDER — SODIUM CHLORIDE 0.9 % IJ SOLN
3.0000 mL | Freq: Two times a day (BID) | INTRAMUSCULAR | Status: DC
  Administered 2014-12-23 – 2014-12-26 (×7): 3 mL via INTRAVENOUS

## 2014-12-23 MED ORDER — ENSURE ENLIVE PO LIQD: 237.0000 mL | Freq: Two times a day (BID) | ORAL | Status: DC

## 2014-12-23 MED ORDER — MORPHINE SULFATE (PF) 2 MG/ML IV SOLN
2.0000 mg | INTRAVENOUS | Status: DC | PRN
  Administered 2014-12-23: 2 mg via INTRAVENOUS
  Filled 2014-12-23: qty 1

## 2014-12-23 MED ORDER — TORSEMIDE 20 MG PO TABS
20.0000 mg | ORAL_TABLET | Freq: Every day | ORAL | Status: DC
  Administered 2014-12-23 – 2014-12-26 (×4): 20 mg via ORAL
  Filled 2014-12-23 (×4): qty 1

## 2014-12-23 MED ORDER — METOPROLOL TARTRATE 50 MG PO TABS
50.0000 mg | ORAL_TABLET | Freq: Two times a day (BID) | ORAL | Status: DC
  Administered 2014-12-23 – 2014-12-26 (×7): 50 mg via ORAL
  Filled 2014-12-23 (×7): qty 1

## 2014-12-23 MED ORDER — SODIUM CHLORIDE 0.9 % IJ SOLN: 3.0000 mL | INTRAMUSCULAR | Status: DC | PRN

## 2014-12-23 MED ORDER — SODIUM CHLORIDE 0.9 % IV BOLUS (SEPSIS)
500.0000 mL | Freq: Once | INTRAVENOUS | Status: AC
  Administered 2014-12-23: 500 mL via INTRAVENOUS

## 2014-12-23 MED ORDER — RIVAROXABAN 15 MG PO TABS
15.0000 mg | ORAL_TABLET | Freq: Every day | ORAL | Status: DC
  Administered 2014-12-23: 15 mg via ORAL
  Filled 2014-12-23 (×2): qty 1

## 2014-12-23 NOTE — Progress Notes (Signed)
Rapid Response Event Note  Overview:   RN was called into room by NT, who reported a blood pressure of 70s/40s, HR in mid-50s. RN entered room, pt was non responsive, eyes open but patient not responding to sternal rub. Other VS and CBG WNL. Charge nurse notified and rapid response called.     Initial Focused Assessment: Pt pale and non responsive to verbal stimulation and sternal rub, though eyes were open. Lung sounds clear, though diminshed.  CBG WNL and VS (except BP and HR, as stated above) WNL.    Interventions: Charge RN notified, IV bolus started. Pt not requiring O2 supplementation, 96% on room air. Dr. Darvin Neighbours paged. Upon rapid response team arrival, decided pt needed to transfer to ICU. Dr. Darvin Neighbours arrived in room, agreed with transfer. Respiratory at bedside, drawing ABG. Pt. tx to [place].   Event Summary:   RN notified of low BP at   11:19am    Dr. Darvin Neighbours paged at 11:20am    Rapid response called at   11:23am  Other notes of import: BP checked prior to morning medications and it was 120s/60s. Wife at bedside confirmed with RN that pt. normally takes all his BP meds at the same time with this BP. Pt received IV morphine for 8/10 chest pain at 0915 this morning and tolerated well. Pt HR had dropped into 50s around an hour prior to this incident, tele clerk notified RN around approx. 1030 am; RN rounded on patient who was resting & sleeping on and off quietly in bed with wife at bedside, at that time there were no concerns for distress. RN notified tele clerk to keep an eye on HR and update RN as HR changed.    Alonna Buckler

## 2014-12-23 NOTE — Progress Notes (Signed)
   12/23/14 1255  Clinical Encounter Type  Visited With Patient and family together  Visit Type Spiritual support  Referral From Chaplain  Consult/Referral To Chaplain  Spiritual Encounters  Spiritual Needs Emotional  Stress Factors  Patient Stress Factors None identified  Family Stress Factors None identified  Chaplain rounded in unit and visited with patient who had been transferred from other unit. On-call Chaplain also informed me of his visit with patient and family. Offered a compassionate presence and additional support as applicable. Chaplain Sonya A. Laws Ext. [phone]

## 2014-12-23 NOTE — Progress Notes (Signed)
*  PRELIMINARY RESULTS* Echocardiogram 2D Echocardiogram has been performed.  Patrick [name] 12/23/2014, 8:09 AM

## 2014-12-23 NOTE — Consult Note (Addendum)
[facility] Consultation     ASSESSMENT/PLAN    PULMONARY  A: Pleural effusion, atypical predominantly left-sided. P:   Would consider a diagnostic thoracentesis on the left side. Once the patient's other acute issues have improved. Currently the patient does not appear to have acute respiratory distress secondary to this effusion and therefore it drainage of this fluid would not appear to be urgent at this time.  CARDIOVASCULAR  A: Diastolic congestive heart failure, history is negative. Echocardiogram on September 30 showed ejection fraction 45 to 50%. -Chronic atrial fibrillation, patient was on [place] at home. -Essential hypertension P:  Continue management per cardiology service. Continue antihypertensives. We will treat conservatively and monitor blood pressure closely as the patient's blood pressure is currently low.  RENAL A:  Chronic kidney disease P:     GASTROINTESTINAL A:  GERD P:   Continue Protonix  HEMATOLOGIC A: Chronic anemia P:    INFECTIOUS   ENDOCRINE A: Diabetes mellitus P:   Continue long-acting insulin plus sliding scale, the patient is also on glipizide.  NEUROLOGIC Dementia   MAJOR EVENTS/TEST RESULTS: -Echocardiograms 12/23/2014 which showed ejection fraction of 45 to 50%. -CT chest* 12/22/2014 showed a moderate left-sided pleural effusion with compressive atelectasis. Also left chronic vertebral artery occlusion. -ABG 12/23/2014 showed a pH of 7.46 with a PCO2 of 39, bicarbonate 27.7, a PO2 of 56  INDWELLING DEVICES::  MICRO DATA: MRSA PCR  Urine  Blood Resp   ANTIMICROBIALS:    ---------------------------------------  ---------------------------------------   Name: Patrick [name] MRN: [id number] DOB: [birth year]    ADMISSION DATE:  12/22/2014 CONSULTATION DATE:  12/23/14  REFERRING MD :  Dr. Vianne Bulls  CHIEF COMPLAINT:  Pleural effusion.    HISTORY OF PRESENT ILLNESS:     81 year old male with history of permanent afib dating back to 12/2011 and has been on renal-dosed xarelto since, chronic diastolic CHF, CKD stage III-IV, anemia of chronic disease, HTN, HLD, DM2, GERD, and Bells' Palsy Patient presented to the hospital with complaints of chest pain as well as progressive exertional dyspnea. Patient was subsequently admitted to the hospital, was seen by cardiology service. He had an EKG and an echocardiogram. The patient is known to have a history of diastolic congestive heart failure, his echocardiogram showed an ejection fraction of 45-50%. Subsequently, he had a CT of the chest, I reviewed the images and the report shows a moderate left-sided pleural effusion with compressive atelectasis and a small right-sided pleural effusion. There was also note of a chronic left subclavian artery stenosis. Only this morning after the patient was evaluated. He was found to be minimally responsive. This is thought to be possibly due to dose of his hypertensive medication or pain medications, which dropped his blood pressure. The patient was minimally to unresponsive at that time, he was urgently brought to the intensive care unit. The patient was given a fluid bolus of 500 mL of normal saline. Subsequently, his mental status is improved. He has no particular complaints at this time. The patient is awake but remains somewhat lethargic. His ABG showed a pH of 7.46 with a PCO2 of 39, bicarbonate 27.7, a PO2 of 56. He is currently on room air with an oxygen saturation of 95%.   PAST MEDICAL HISTORY :  Past Medical History  Diagnosis Date  . Hypertension   . Diabetes mellitus without complication   . Hyperlipidemia   . History of gastroesophageal reflux (GERD)   . CKD (chronic kidney disease),  stage III     a. stage III-IV  . Permanent atrial fibrillation     a. Dx 12/2011, Rate-controlled, chronic Xarelto (renal dosing).  . Bell's palsy   . Chronic diastolic CHF (congestive  heart failure)     a. 11/2012 Echo: EF 60-65%, mod conc LVH, mildly dil LA/RA, mild Ao sclerosis w/o stenosis; b. stage III-IV    . Anemia of chronic disease     a. plan of oncology to start Procrit - received this during admission 12/2013  . Chronic atrial fibrillation   . Hyperlipidemia   . GERD (gastroesophageal reflux disease)   . Bell's palsy   . Amputated finger    Past Surgical History  Procedure Laterality Date  . Finger surgery      right hand  . Colonoscopy  09/2011  . Esophagogastroduodenoscopy    . Appendectomy     Prior to Admission medications   Medication Sig Start Date End Date Taking? Authorizing Selby Foisy  aspirin 81 MG tablet Take 81 mg by mouth daily.    Historical Akima Slaugh, MD  cloNIDine (CATAPRES) 0.1 MG tablet Take 1 tablet (0.1 mg total) by mouth 2 (two) times daily. 05/10/13   Minna Merritts, MD  docusate sodium (COLACE) 100 MG capsule Take 100 mg by mouth 2 (two) times daily.    Historical Magic Mohler, MD  enalapril (VASOTEC) 5 MG tablet Take 5 mg by mouth daily.  07/16/13   Historical Marcelyn Ruppe, MD  Ferrous Sulfate (IRON) 325 (65 FE) MG TABS Take 1 tablet by mouth daily.    Historical Daissy Yerian, MD  glipiZIDE (GLUCOTROL XL) 10 MG 24 hr tablet Take 10 mg by mouth 2 (two) times daily.    Historical Otillia Cordone, MD  hydrALAZINE (APRESOLINE) 100 MG tablet TAKE ONE TABLET BY MOUTH THREE TIMES DAILY 07/19/13   Minna Merritts, MD  Insulin Lispro Prot & Lispro (HUMALOG MIX 75/25 [place]) Inject 70 Units into the skin 2 (two) times daily.    Historical Seynabou Fults, MD  metoprolol (LOPRESSOR) 50 MG tablet Take 1 tablet (50 mg total) by mouth 2 (two) times daily. 02/08/14   Minna Merritts, MD  omeprazole (PRILOSEC) 40 MG capsule Take 40 mg by mouth daily.    Historical Larraine Argo, MD  pravastatin (PRAVACHOL) 40 MG tablet Take two tablets daily.    Historical Morganne Haile, MD  Rivaroxaban (XARELTO) 15 MG TABS tablet Take 1 tablet (15 mg total) by mouth daily. 05/05/12   Minna Merritts, MD   torsemide (DEMADEX) 20 MG tablet TAKE ONE TABLET BY MOUTH TWICE DAILY AS NEEDED 03/21/14   Minna Merritts, MD   Allergies  Allergen Reactions  . No Known Allergies     FAMILY HISTORY:  Family History  Problem Relation Age of Onset  . Heart attack Brother   . Hypertension    . Diabetes Mother   . Diabetes Sister    SOCIAL HISTORY:  reports that he quit smoking about 44 years ago. His smoking use included Cigars. He has never used smokeless tobacco. He reports that he does not drink alcohol or use illicit drugs.  REVIEW OF SYSTEMS:   Constitutional: Feels well. Cardiovascular: No chest pain.  Pulmonary: Denies dyspnea.   The remainder of systems were reviewed and were found to be negative other than what is documented in the HPI.    VITAL SIGNS: Temp:  [98.2 F (36.8 C)-98.4 F (36.9 C)] 98.2 F (36.8 C) (09/30 1119) Pulse Rate:  [54-112] 56 (09/30 1129) Resp:  [16-32]  18 (09/30 1119) BP: (78-186)/(44-83) 85/44 mmHg (09/30 1129) SpO2:  [92 %-98 %] 94 % (09/30 1119) Weight:  [75.297 kg (166 lb)-77.111 kg (170 lb)] 77.111 kg (170 lb) (09/30 0131) HEMODYNAMICS:   VENTILATOR SETTINGS:   INTAKE / OUTPUT:  Intake/Output Summary (Last 24 hours) at 12/23/14 1232 Last data filed at 12/23/14 0933  Gross per 24 hour  Intake      3 ml  Output    100 ml  Net    -97 ml    Physical Examination:   VS: BP 85/44 mmHg  Pulse 56  Temp(Src) 98.2 F (36.8 C) (Oral)  Resp 18  Ht 5\' 7"  (1.702 m)  Wt 77.111 kg (170 lb)  BMI 26.62 kg/m2  SpO2 94%  General Appearance: No distress  Neuro:without focal findings, mental status, speech normal, alert and oriented, cranial nerves 2-12 intact, reflexes normal and symmetric, sensation grossly normal  HEENT: PERRLA, EOM intact, no ptosis, no other lesions noticed;  Pulmonary: normal breath sounds., diaphragmatic excursion normal.No wheezing, No rales;     CardiovascularNormal S1,S2.  No m/r/g.    Abdomen: Benign, Soft, non-tender, No  masses, hepatosplenomegaly, No lymphadenopathy Renal:  No costovertebral tenderness  GU:  Not performed at this time. Endoc: No evident thyromegaly, no signs of acromegaly. Skin:   warm, no rashes, no ecchymosis  Extremities: normal, no cyanosis, clubbing, no edema, warm with normal capillary refill.    LABS: Reviewed   LABORATORY PANEL:   CBC  Recent Labs Lab 12/22/14 2230  WBC 8.0  HGB 11.3*  HCT 33.9*  PLT 354    Chemistries   Recent Labs Lab 12/22/14 2230  NA 136  K 3.9  CL 100*  CO2 28  GLUCOSE 232*  BUN 32*  CREATININE 2.00*  CALCIUM 9.1     Recent Labs Lab 12/23/14 0140 12/23/14 0738 12/23/14 1126  GLUCAP 156* 159* 108*    Recent Labs Lab 12/23/14 1130  PHART 7.46*  PCO2ART 39  PO2ART 56*   No results for input(s): AST, ALT, ALKPHOS, BILITOT, ALBUMIN in the last 168 hours.  Cardiac Enzymes  Recent Labs Lab 12/23/14 0719  TROPONINI 0.03    RADIOLOGY:  Dg Chest Port 1 View  12/23/2014   CLINICAL DATA:  Shortness of breath.  EXAM: PORTABLE CHEST - 1 VIEW  COMPARISON:  CTA chest 12/22/2014.  FINDINGS: The heart is enlarged. Atherosclerotic calcifications are again noted. B left pleural effusion and associated airspace disease are noted. The right lung remains clear apart from minimal basilar atelectasis. The lung volumes are low. The visualized soft tissues and bony thorax are unremarkable.  IMPRESSION: 1. Persistent left pleural effusion and associated airspace disease. While this may represent atelectasis, early infection is not excluded. 2. Minimal atelectasis at the right lung base. 3. Atherosclerosis. 4. Low lung volumes.   Electronically Signed   By: San Morelle M.D.   On: 12/23/2014 12:21   Ct Angio Chest Aorta W/cm &/or Wo/cm  12/22/2014   CLINICAL DATA:  Midsternal chest pain beginning at 1900 hours. History of atrial fibrillation.  EXAM: CT ANGIOGRAPHY CHEST WITH CONTRAST  TECHNIQUE: Multidetector CT imaging of the chest was  performed using the standard protocol during bolus administration of intravenous contrast. Multiplanar CT image reconstructions and MIPs were obtained to evaluate the vascular anatomy.  CONTRAST:  34mL OMNIPAQUE IOHEXOL 350 MG/ML SOLN  COMPARISON:  Chest radiograph October 05, 2014  FINDINGS: MEDIASTINUM: No abnormal density along the thoracic aorta by noncontrast CT. Moderate calcific atherosclerosis  of the aortic arch. The thoracic aorta is normal in caliber, no evidence of dissection, aneurysm, suspicious luminal irregularity or contrast extravasation. Origins of the innominate artery, LEFT subclavian and LEFT Common carotid artery are patent. However, occluded LEFT vertebral artery at the origin. The heart size is mildly enlarged, moderate coronary artery calcifications. Mild pericardial thickening without frank effusion. No central pulmonary embolus though, not tailored for evaluation. Mediastinal lymphadenopathy, 14 mm RIGHT tracheal, 16 mm RIGHT tracheal lymph nodes, as sub carinal 20 mm short axis lymph node, subcentimeter aortopulmonary window lymph nodes.  LUNGS: Tracheobronchial tree is patent, no pneumothorax. Small RIGHT, moderate layering LEFT pleural effusion. Enhancing atelectasis LEFT lower lobe, and to lesser extent lingula, with 14 mm enhancing nodular density, axial 112/162.  SOFT TISSUES AND OSSEOUS STRUCTURES: Punctate splenic granulomas. Thickened appearance of the LEFT adrenal gland without discrete nodule. Mild degenerative change of the thoracic spine. No destructive bony lesions. 17 mm LEFT thyroid nodule.  Review of the MIP images confirms the above findings.  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 No convincing evidence of acute vascular process.  Moderate LEFT, small RIGHT pleural effusions. Enhancing atelectasis. 14 mm lingular nodular density could represent parenchymal nodule or, round atelectasis.  Mediastinal lymphadenopathy, though this may be reactive,  recommend follow-up.  17 mm LEFT thyroid nodule for which follow up thyroid sonogram is recommended on a nonemergent basis.   Electronically Signed   By: Elon Alas M.D.   On: 12/22/2014 23:44       --Marda Stalker, MD.  Board Certified in Internal Medicine, Pulmonary Medicine, [place], and Sleep Medicine.  Pager [phone] [facility] Office Number: [phone]  Patricia Pesa, M.D.  Vilinda Boehringer, M.D.  Merton Border, M.D   12/23/2014, 12:32 PM  Critical Care Attestation.  I have personally obtained a history, examined the patient, evaluated laboratory and imaging results, formulated the assessment and plan and placed orders. The Patient requires high complexity decision making for assessment and support, frequent evaluation and titration of therapies, application of advanced monitoring technologies and extensive interpretation of multiple databases. The patient has critical illness that could lead imminently to failure of 1 or more organ systems and requires the highest level of physician preparedness to intervene.  Critical Care Time devoted to patient care services described in this note is 35 minutes and is exclusive of time spent in procedures.

## 2014-12-23 NOTE — Progress Notes (Signed)
Skin verified with Luanna Salk, RN. Skin is intact, one small abrasion to the right elbow & amputated fingers to the right hand. Conley Simmonds, RN

## 2014-12-23 NOTE — Discharge Instructions (Signed)
[facility] appointment on January 10, 2015 at 1:00pm with Darylene Price, [place]. Please call [phone] to reschedule.

## 2014-12-23 NOTE — Care Management Note (Signed)
Case Management Note  Patient Details  Name: Patrick [name] MRN: [id number] Date of Birth: 25-Jan-1932  Subjective/Objective:    Transferred to ICU following a rapid response for hypotension, HR 50's, unresponsiveness to sternal rub. Noted previous RNCM notes.                 Action/Plan:   Expected Discharge Date:                  Expected Discharge Plan:     In-House Referral:     Discharge planning Services     Post Acute Care Choice:    Choice offered to:     DME Arranged:    DME Agency:     HH Arranged:    [place] Agency:     Status of Service:     Medicare Important Message Given:    Date Medicare IM Given:    Medicare IM give by:    Date Additional Medicare IM Given:    Additional Medicare Important Message give by:     If discussed at [place] of Stay Meetings, dates discussed:    Additional Comments:  Jolly Mango, RN 12/23/2014, 12:06 PM

## 2014-12-23 NOTE — Progress Notes (Signed)
PT Cancellation Note  Patient Details Name: Patrick [name] MRN: [id number] DOB: August 23, 1931   Cancelled Treatment:    Reason Eval/Treat Not Completed: Other (comment) (Chart reviewed pt, do to recent transfer to CCU at 11:48 pt unable to be seen until new order has been placed. Reconsult as medically appropriate. )   Milon Score 12/23/2014, 12:54 PM

## 2014-12-23 NOTE — H&P (Signed)
[facility] [place] NAME: Patrick [name]    MR#:  [id number]  DATE OF BIRTH:  1931/11/19  DATE OF ADMISSION:  12/22/2014  PRIMARY CARE PHYSICIAN: Maryland Pink, MD   REQUESTING/REFERRING PHYSICIAN: Dr. Archie Balboa  CHIEF COMPLAINT:   Chief Complaint  Patient presents with  . Chest Pain    HISTORY OF PRESENT ILLNESS:  Patrick [name]  is a 81 y.o. male with a known history of hypertension, permanent atrial fibrillation on Xarelto, diabetes mellitus type 2, CK D stage III, chronic diastolic congestive heart failure, GERD, and anemia of chronic disease presents to the emergency room with the complaint of acute onset of retrosternal chest pain which started around 7 PM while he was resting at home. Chest pain sharp in character, no radiation of pain, not associated with any shortness of breath, dizziness, palpitations, diaphoresis. Denies any recent fever, cough, abdominal pain, nausea, vomiting, diarrhea, dysuria. No similar symptoms in the past. On arrival to the ED patient was noted to be hypertensive with blood pressure 186/82 and tachycardic with heart rate around 112. Diagnosis of possible aortic dissection was entertained and CT angiogram of the chest was obtained and reported negative for aortic dissection and pulmonary embolism. He was noted to have moderate left pleural effusion and mild right pleural effusion with chronic-appearing lymphadenopathy. Lab work significant for elevated BUN/creatinine of 32/2.0 which are in the stable range, H&H 11.3/33.9, troponin 0.03, blood sugars 232. EKG atrial fibrillation with ventricular of 1 17 bpm, nonspecific T-wave abnormality. Patient received IV morphine following which his chest pain is better but not resolved completely. Hospitalist service was consulted for further management. Currently patient is resting reasonably comfortably in the bed and states his chest pain increases with  respirations.  PAST MEDICAL HISTORY:   Past Medical History  Diagnosis Date  . Hypertension   . Diabetes mellitus without complication   . Hyperlipidemia   . History of gastroesophageal reflux (GERD)   . CKD (chronic kidney disease), stage III     a. stage III-IV  . Permanent atrial fibrillation     a. Dx 12/2011, Rate-controlled, chronic Xarelto (renal dosing).  . Bell's palsy   . Chronic diastolic CHF (congestive heart failure)     a. 11/2012 Echo: EF 60-65%, mod conc LVH, mildly dil LA/RA, mild Ao sclerosis w/o stenosis; b. stage III-IV    . Anemia of chronic disease     a. plan of oncology to start Procrit - received this during admission 12/2013  . Atrial fibrillation   . Hyperlipidemia   . GERD (gastroesophageal reflux disease)   . Bell's palsy   . Amputated finger   . Dysrhythmia     PAST SURGICAL HISTORY:   Past Surgical History  Procedure Laterality Date  . Finger surgery      right hand  . Colonoscopy  09/2011  . Esophagogastroduodenoscopy    . Appendectomy      SOCIAL HISTORY:   Social History  Substance Use Topics  . Smoking status: Former Smoker -- 0.25 packs/day for 10 years    Types: Cigars    Quit date: 05/06/1970  . Smokeless tobacco: Never Used  . Alcohol Use: No    FAMILY HISTORY:   Family History  Problem Relation Age of Onset  . Heart attack Brother   . Hypertension    . Diabetes Mother   . Diabetes Sister     DRUG ALLERGIES:   Allergies  Allergen Reactions  .  No Known Allergies     REVIEW OF SYSTEMS:   Review of Systems  Constitutional: Negative for fever, chills and malaise/fatigue.  HENT: Negative for ear pain, hearing loss, nosebleeds, sore throat and tinnitus.   Eyes: Negative for blurred vision, double vision, pain, discharge and redness.  Respiratory: Negative for cough, hemoptysis, sputum production, shortness of breath and wheezing.   Cardiovascular: Positive for chest pain. Negative for palpitations, orthopnea and  leg swelling.  Gastrointestinal: Negative for nausea, vomiting, abdominal pain, diarrhea, constipation, blood in stool and melena.  Genitourinary: Negative for dysuria, urgency, frequency and hematuria.  Musculoskeletal: Negative for back pain, joint pain and neck pain.  Skin: Negative for itching and rash.  Neurological: Negative for dizziness, tingling, sensory change, focal weakness and seizures.  Endo/Heme/Allergies: Does not bruise/bleed easily.  Psychiatric/Behavioral: Negative for depression. The patient is not nervous/anxious.     MEDICATIONS AT HOME:   Prior to Admission medications   Medication Sig Start Date End Date Taking? Authorizing Provider  aspirin 81 MG tablet Take 81 mg by mouth daily.    Historical Provider, MD  cloNIDine (CATAPRES) 0.1 MG tablet Take 1 tablet (0.1 mg total) by mouth 2 (two) times daily. 05/10/13   Minna Merritts, MD  docusate sodium (COLACE) 100 MG capsule Take 100 mg by mouth 2 (two) times daily.    Historical Provider, MD  enalapril (VASOTEC) 5 MG tablet Take 5 mg by mouth daily.  07/16/13   Historical Provider, MD  Ferrous Sulfate (IRON) 325 (65 FE) MG TABS Take 1 tablet by mouth daily.    Historical Provider, MD  glipiZIDE (GLUCOTROL XL) 10 MG 24 hr tablet Take 10 mg by mouth 2 (two) times daily.    Historical Provider, MD  hydrALAZINE (APRESOLINE) 100 MG tablet TAKE ONE TABLET BY MOUTH THREE TIMES DAILY 07/19/13   Minna Merritts, MD  Insulin Lispro Prot & Lispro (HUMALOG MIX 75/25 [place]) Inject 70 Units into the skin 2 (two) times daily.    Historical Provider, MD  metoprolol (LOPRESSOR) 50 MG tablet Take 1 tablet (50 mg total) by mouth 2 (two) times daily. 02/08/14   Minna Merritts, MD  omeprazole (PRILOSEC) 40 MG capsule Take 40 mg by mouth daily.    Historical Provider, MD  pravastatin (PRAVACHOL) 40 MG tablet Take two tablets daily.    Historical Provider, MD  Rivaroxaban (XARELTO) 15 MG TABS tablet Take 1 tablet (15 mg total) by mouth daily.  05/05/12   Minna Merritts, MD  torsemide (DEMADEX) 20 MG tablet TAKE ONE TABLET BY MOUTH TWICE DAILY AS NEEDED 03/21/14   Minna Merritts, MD      VITAL SIGNS:  Blood pressure 155/83, pulse 104, temperature 98.2 F (36.8 C), temperature source Oral, resp. rate 16, weight 75.297 kg (166 lb), SpO2 92 %.  PHYSICAL EXAMINATION:  Physical Exam  Constitutional: He is oriented to person, place, and time. He appears well-developed and well-nourished. No distress.  HENT:  Head: Normocephalic and atraumatic.  Right Ear: External ear normal.  Left Ear: External ear normal.  Nose: Nose normal.  Mouth/Throat: Oropharynx is clear and moist. No oropharyngeal exudate.  Eyes: EOM are normal. Pupils are equal, round, and reactive to light. No scleral icterus.  Neck: Normal range of motion. Neck supple. No JVD present. No thyromegaly present.  Cardiovascular: Normal rate, normal heart sounds and intact distal pulses.  Exam reveals no friction rub.   No murmur heard. Rhythm is irregularly irregular  Respiratory: Effort normal and  breath sounds normal. No respiratory distress. He has no wheezes. He has no rales. He exhibits no tenderness.  No chest wall tenderness  GI: Soft. Bowel sounds are normal. He exhibits no distension and no mass. There is no tenderness. There is no rebound and no guarding.  Musculoskeletal: Normal range of motion. He exhibits no edema.  Lymphadenopathy:    He has no cervical adenopathy.  Neurological: He is alert and oriented to person, place, and time. He has normal reflexes. He displays normal reflexes. No cranial nerve deficit. He exhibits normal muscle tone.  Skin: Skin is warm. No rash noted. No erythema.  Psychiatric: He has a normal mood and affect. His behavior is normal. Thought content normal.   LABORATORY PANEL:   CBC  Recent Labs Lab 12/22/14 2230  WBC 8.0  HGB 11.3*  HCT 33.9*  PLT 354    ------------------------------------------------------------------------------------------------------------------  Chemistries   Recent Labs Lab 12/22/14 2230  NA 136  K 3.9  CL 100*  CO2 28  GLUCOSE 232*  BUN 32*  CREATININE 2.00*  CALCIUM 9.1   ------------------------------------------------------------------------------------------------------------------  Cardiac Enzymes  Recent Labs Lab 12/22/14 2230  TROPONINI 0.03   ------------------------------------------------------------------------------------------------------------------  RADIOLOGY:  Ct Angio Chest Aorta W/cm &/or Wo/cm  12/22/2014   CLINICAL DATA:  Midsternal chest pain beginning at 1900 hours. History of atrial fibrillation.  EXAM: CT ANGIOGRAPHY CHEST WITH CONTRAST  TECHNIQUE: Multidetector CT imaging of the chest was performed using the standard protocol during bolus administration of intravenous contrast. Multiplanar CT image reconstructions and MIPs were obtained to evaluate the vascular anatomy.  CONTRAST:  35mL OMNIPAQUE IOHEXOL 350 MG/ML SOLN  COMPARISON:  Chest radiograph October 05, 2014  FINDINGS: MEDIASTINUM: No abnormal density along the thoracic aorta by noncontrast CT. Moderate calcific atherosclerosis of the aortic arch. The thoracic aorta is normal in caliber, no evidence of dissection, aneurysm, suspicious luminal irregularity or contrast extravasation. Origins of the innominate artery, LEFT subclavian and LEFT Common carotid artery are patent. However, occluded LEFT vertebral artery at the origin. The heart size is mildly enlarged, moderate coronary artery calcifications. Mild pericardial thickening without frank effusion. No central pulmonary embolus though, not tailored for evaluation. Mediastinal lymphadenopathy, 14 mm RIGHT tracheal, 16 mm RIGHT tracheal lymph nodes, as sub carinal 20 mm short axis lymph node, subcentimeter aortopulmonary window lymph nodes.  LUNGS: Tracheobronchial tree is  patent, no pneumothorax. Small RIGHT, moderate layering LEFT pleural effusion. Enhancing atelectasis LEFT lower lobe, and to lesser extent lingula, with 14 mm enhancing nodular density, axial 112/162.  SOFT TISSUES AND OSSEOUS STRUCTURES: Punctate splenic granulomas. Thickened appearance of the LEFT adrenal gland without discrete nodule. Mild degenerative change of the thoracic spine. No destructive bony lesions. 17 mm LEFT thyroid nodule.  Review of the MIP images confirms the above findings.  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 No convincing evidence of acute vascular process.  Moderate LEFT, small RIGHT pleural effusions. Enhancing atelectasis. 14 mm lingular nodular density could represent parenchymal nodule or, round atelectasis.  Mediastinal lymphadenopathy, though this may be reactive, recommend follow-up.  17 mm LEFT thyroid nodule for which follow up thyroid sonogram is recommended on a nonemergent basis.   Electronically Signed   By: Elon Alas M.D.   On: 12/22/2014 23:44    EKG:   Orders placed or performed in visit on 10/05/14  . EKG 12-Lead  . EKG 12-Lead  . EKG 12-Lead  . EKG 12-Lead  Atrial fibrillation with ventricular rate of 11 7 bpm, nonspecific  T-wave abnormality  IMPRESSION AND PLAN:   1. Chest pain-unspecified. EKG no acute ischemic changes, troponin 1 WNL. CT angio of the chest negative for pulmonary embolism and aortic dissection. Rule out ACS. Rule out pericardial causes,? Musculoskeletal. Plan: Admit to telemetry, continue aspirin and beta blocker, IV pain control medications as needed. Cycle cardiac enzymes. 2-D echocardiogram and cardiology consultation requested for further evaluation and advice. 2. Bilateral pleural effusions on CT chest.? Chronic versus acute. Plan: Pulmonary consultation requested for further evaluation. Continue low-dose diuretics. 3. Permanent atrial flutter ablation, on Eliquis. Heart rate in reasonable control,  no acute problems. Continue home medications including Eliquis. 4. Diabetes mellitus type 2 stable on home medications. Continue same. Follow up blood sugars closely. 5. Hypertension, stable on home medications. Continue same. 6. CK D stage III, stable clinically. Creatinine in stable range. Monitor BMP closely. Avoid nephrotoxic agents.  7. History of chronic diastolic congestive heart failure, stable. Continue home medications.  DVT prophylaxis: Xarelto    All the records are reviewed and case discussed with ED provider. Management plans discussed with the patient, family and they are in agreement.  CODE STATUS: Full code  TOTAL TIME TAKING CARE OF THIS PATIENT: 50 minutes.    Azucena Freed N M.D on 12/23/2014 at 12:44 AM  Between 7am to 6pm - Pager - [phone]  After 6pm go to [email or web address] - password [email or web address] [facility]  [facility]  Office  [phone]  CC: Primary care physician; Maryland Pink, MD

## 2014-12-23 NOTE — Progress Notes (Signed)
[facility] [place] NAME: Patrick [name]    MR#:  [id number]  DATE OF BIRTH:  1931-08-24  SUBJECTIVE:  CHIEF COMPLAINT:   Chief Complaint  Patient presents with  . Chest Pain  Admitted for chest pain. Rapid response called as patient was not responding. On arrival had shallow breathing. Tele showed HR 50-55. BP 85/44. Had chest pain earlier today. Received morphine at 9.15 AM. Got his BP meds in AM.  REVIEW OF SYSTEMS:    Review of Systems  Unable to perform ROS: mental acuity      DRUG ALLERGIES:   Allergies  Allergen Reactions  . No Known Allergies     VITALS:  Blood pressure 85/44, pulse 56, temperature 98.2 F (36.8 C), temperature source Oral, resp. rate 18, height 5\' 7"  (1.702 m), weight 77.111 kg (170 lb), SpO2 94 %.  PHYSICAL EXAMINATION:   Physical Exam  GENERAL:  81 y.o.-year-old patient lying in the bed with no acute distress. Critically ill EYES: Pupils equal, round, reactive to light and accommodation. No scleral icterus. Extraocular muscles intact.  HEENT: Head atraumatic, normocephalic. Oropharynx and nasopharynx clear.  NECK:  Supple, no jugular venous distention. No thyroid enlargement, no tenderness.  LUNGS: Decreased breath sounds bilateral. CARDIOVASCULAR: S1, S2 normal. No murmurs, rubs, or gallops.  ABDOMEN: Soft, nontender, nondistended. Bowel sounds present. No organomegaly or mass.  EXTREMITIES: No cyanosis, clubbing or edema b/l.    NEUROLOGIC: Moves all extremities  PSYCHIATRIC: The patient is drowzy SKIN: No obvious rash   LABORATORY PANEL:   CBC  Recent Labs Lab 12/22/14 2230  WBC 8.0  HGB 11.3*  HCT 33.9*  PLT 354   ------------------------------------------------------------------------------------------------------------------  Chemistries   Recent Labs Lab 12/22/14 2230  NA 136  K 3.9  CL 100*  CO2 28  GLUCOSE 232*  BUN 32*  CREATININE 2.00*  CALCIUM  9.1   ------------------------------------------------------------------------------------------------------------------  Cardiac Enzymes  Recent Labs Lab 12/23/14 0719  TROPONINI 0.03   ------------------------------------------------------------------------------------------------------------------  RADIOLOGY:  Ct Angio Chest Aorta W/cm &/or Wo/cm  12/22/2014   CLINICAL DATA:  Midsternal chest pain beginning at 1900 hours. History of atrial fibrillation.  EXAM: CT ANGIOGRAPHY CHEST WITH CONTRAST  TECHNIQUE: Multidetector CT imaging of the chest was performed using the standard protocol during bolus administration of intravenous contrast. Multiplanar CT image reconstructions and MIPs were obtained to evaluate the vascular anatomy.  CONTRAST:  33mL OMNIPAQUE IOHEXOL 350 MG/ML SOLN  COMPARISON:  Chest radiograph October 05, 2014  FINDINGS: MEDIASTINUM: No abnormal density along the thoracic aorta by noncontrast CT. Moderate calcific atherosclerosis of the aortic arch. The thoracic aorta is normal in caliber, no evidence of dissection, aneurysm, suspicious luminal irregularity or contrast extravasation. Origins of the innominate artery, LEFT subclavian and LEFT Common carotid artery are patent. However, occluded LEFT vertebral artery at the origin. The heart size is mildly enlarged, moderate coronary artery calcifications. Mild pericardial thickening without frank effusion. No central pulmonary embolus though, not tailored for evaluation. Mediastinal lymphadenopathy, 14 mm RIGHT tracheal, 16 mm RIGHT tracheal lymph nodes, as sub carinal 20 mm short axis lymph node, subcentimeter aortopulmonary window lymph nodes.  LUNGS: Tracheobronchial tree is patent, no pneumothorax. Small RIGHT, moderate layering LEFT pleural effusion. Enhancing atelectasis LEFT lower lobe, and to lesser extent lingula, with 14 mm enhancing nodular density, axial 112/162.  SOFT TISSUES AND OSSEOUS STRUCTURES: Punctate splenic  granulomas. Thickened appearance of the LEFT adrenal gland without discrete nodule. Mild degenerative change of the  thoracic spine. No destructive bony lesions. 17 mm LEFT thyroid nodule.  Review of the MIP images confirms the above findings.  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 No convincing evidence of acute vascular process.  Moderate LEFT, small RIGHT pleural effusions. Enhancing atelectasis. 14 mm lingular nodular density could represent parenchymal nodule or, round atelectasis.  Mediastinal lymphadenopathy, though this may be reactive, recommend follow-up.  17 mm LEFT thyroid nodule for which follow up thyroid sonogram is recommended on a nonemergent basis.   Electronically Signed   By: Elon Alas M.D.   On: 12/22/2014 23:44     ASSESSMENT AND PLAN:  81 m with CAD, systolic CHF, CKD 3 here with CP  * Hypotension and syncope Likely from cardiomyopathy Bolus NS. Hold BP meds for now.  * Unstable angina with new wall motion abnormalities on Echo. Lower EF ASA, Imdur, BB, Statin. Cardiology on board. Repeat EKG.  * Bilateral pleural effusions on CT chest. Chronic versus acute. Pulmonary consult pending. Discussed with Dr. Stevenson Clinch. Will repeat CXR STAT if this was involved in his worsenign  * Permanent atrial flutter ablation, on Eliquis. Heart rate in reasonable control, no acute problems. Continue home medications including Eliquis.  * Diabetes mellitus type 2 stable on home medications. Continue same. Follow up blood sugars closely.  * Hypertension, stable on home medications. Continue same.  * CKD3 - Monitor Cr. May need cath and can worsen due to contrast load   * Chronic systolic chf Monitor for fluid overload.  Critically ill with high risk for cardiac arrest and death.  Discussed with Wife as patient unable to talk.  Per patient's wishes she has requested he not be intubated but ok to resuscitate with CPD/meds  All the records are  reviewed and case discussed with Care Management/Social Workerr. Management plans discussed with the patient, family and they are in agreement.  CODE STATUS: Limited code. No Intubation/Vent support.  DVT Prophylaxis: SCDs  TOTAL TIME TAKING CARE OF THIS PATIENT: 35 minutes.     Hillary Bow R M.D on 12/23/2014 at 11:44 AM  Between 7am to 6pm - Pager - [phone]  After 6pm go to [email or web address] - password [email or web address] [facility]  Office  [phone]  CC: Primary care physician; Maryland Pink, MD    Note: This dictation was prepared with Dragon dictation along with smaller phrase technology. Any transcriptional errors that result from this process are unintentional.

## 2014-12-23 NOTE — Progress Notes (Signed)
Pt's 2nd troponin came back positive at 0.16, first troponin was 0.03. Pt not having chest pain currently. MD paged, Dr. Reece Levy made aware. No new orders. Pt is on xarelto & metoprolol. Echo & cardiology consult pending for tomorrow. Will continue to monitor. Conley Simmonds, RN

## 2014-12-23 NOTE — Care Management (Addendum)
Order present for care management consult.  Patient presents from home.  Prior to this admission, patient independent in all adls, denies  problems accessing medical care or obtaining  medications.  Occasionally drives.  Does not have chronic home 02.  Patient is followed by nephrology. Patient and wife state that there has been discussion regarding need for dialysis but patient adamantly declines.  Patient with very decreased appetite over the past year because "my taste buds are gone."  Patient is current with his PCP.  Wife is concerned that she "missed something" before patient proceeding to the hospital.  She had directions to give diuretic in the event that patient had "extra fluid" -  swelling in lower extremities. CXR shows pleural effusions and wife fears she should have known this and given the lasix because "it is fluid."  Assured wife that she would not have known about this and she has managed patient's care appropriately.  Patient and wife are open to home health if it is determined that patient will require this at discharge.  No agency preference.  Pulmonary and cardiology consults are pending.

## 2014-12-23 NOTE — Progress Notes (Signed)
   12/23/14 1125  Clinical Encounter Type  Visited With Patient;Family  Visit Type Code  Referral From Nurse  Consult/Referral To Chaplain  Spiritual Encounters  Spiritual Needs Prayer;Emotional  Stress Factors  Patient Stress Factors Exhausted;Health changes  Family Stress Factors Family relationships;Major life changes  Met w/spouse and then patient. Provided pastoral presence with family members as patient was relocated to [place]. Provided liaison care and prayer.  Chap. Carrington Olazabal G. Hargill

## 2014-12-23 NOTE — Consult Note (Signed)
Cardiology Consultation Note  Patient ID: Patrick [name], MRN: [id number], DOB/AGE: 04/11/1931 81 y.o. Admit date: 12/22/2014   Date of Consult: 12/23/2014 Primary Physician: Maryland Pink, MD Primary Cardiologist: Dr. [name], MD  Chief Complaint: Chest pain Reason for Consult: Chest pain  HPI: 81 year old male with history of permanent afib dating back to 12/2011 and has been on renal-dosed xarelto since, chronic diastolic CHF, CKD stage III-IV, anemia of chronic disease, HTN, HLD, DM2, GERD, and Bells' Palsy who presented to [facility] on 9/29 with complaints of nonexertional chest pain.   He has been well rate-controlled using metoprolol 50 mg bid. He was previously on diltiazem but did not tolerate it 2/2 worsening lower extremity swelling. He also has a h/o diast CHF with echo in 11/2012 showing nl LV fxn. He has a h/o CKD stage III-IV and is followed by nephrology as an outpatient. His wife indicates that his torsemide dose was cut back previously to prn dosing in the setting of rising creatinine. Pt weighs himself daily with weights running around 165 to 166. Most recent weight at home on 9/29 being 166. He just saw his nephrologist on 9/29 and "got a good report."  Prior admission to [facility] in 10/12 with increased DOE. BNP was found to be 2708. CXR showed mild CHF with bilateral pleural effusions. He was diuresed with IV Lasix 40 mg BID with good response. He does have a narrow euvolemic window 2/2 his CKD. He was restarted back on home dose of torsemide 20 mg bid with double dose prn weight gain. Another admission 12/2013 with SOB, pleural effusions, and diastolic CHF. He responded well to diuresis.   He has previously been hypotensive at his recent office visits. His wife is not giving daily torsemide as above, only prn. He continues to follow up closely at the cancer center for his anemia.   He presented to [facility] on 9/29 with nonexertional chest pain. No associated symptoms. His wife  states she feels like he has been doing pretty good.   Upon his arrival he was found to have troponin 0.03-->0.16-->0.03, ECG Afib with RVR, 117 bpm, nonspecific lateral st/t changes, CXR not done, CTA chest showed left vertebral artery chronic occlusion, moderate left and small right pleural effusions, and 17 mm left thyroid nodule with follow up thyroid US recommended. HGB 11.3, SCr 2.00. An echo was ordered and resulted as below, consults were placed for pulmonary and cardiology, and he was continued on his home medications.   Since his admission, he has continued to have intermittent nonexertional chest pain. He has been receiving morphine prn for this with ok results. No associated SOB, diaphoresis, palpitations, nausea, or vomiting. He is currently continues to have intermittent chest pain, and did have some pain this morning requiring morphine. He did have an episode of Afib with RVR at 1:50 AM with hear rates into the 150's. His 2nd troponin (the elevated one) was collected at the same time.           Past Medical History  Diagnosis Date  . Hypertension   . Diabetes mellitus without complication   . Hyperlipidemia   . History of gastroesophageal reflux (GERD)   . CKD (chronic kidney disease), stage III     a. stage III-IV  . Permanent atrial fibrillation     a. Dx 12/2011, Rate-controlled, chronic Xarelto (renal dosing).  . Bell's palsy   . Chronic diastolic CHF (congestive heart failure)     a. 11/2012 Echo: EF  60-65%, mod conc LVH, mildly dil LA/RA, mild Ao sclerosis w/o stenosis; b. stage III-IV    . Anemia of chronic disease     a. plan of oncology to start Procrit - received this during admission 12/2013  . Chronic atrial fibrillation   . Hyperlipidemia   . GERD (gastroesophageal reflux disease)   . Bell's palsy   . Amputated finger       Most Recent Cardiac Studies: Echo 12/23/2014:  Study Conclusions  - Procedure narrative: Transthoracic echocardiography. The  study was technically difficult. - Left ventricle: The cavity size was normal. There was moderate concentric hypertrophy. Systolic function was mildly to moderately reduced. The estimated ejection fraction was in the range of 40% to 45%. Probable hypokinesis of the mid-apicalanteroseptal, anterior, and apical myocardium. Features are consistent with a pseudonormal left ventricular filling pattern, with concomitant abnormal relaxation and increased filling pressure (grade 2 diastolic dysfunction). - Left atrium: The atrium was moderately dilated. - Right atrium: The atrium was mildly dilated.  Echo 11/30/2012:  EF 60-65%, moderate concentric LVH, mild biatrial enlargement, mild aortic sclerosis without stenosis.    Surgical History:  Past Surgical History  Procedure Laterality Date  . Finger surgery      right hand  . Colonoscopy  09/2011  . Esophagogastroduodenoscopy    . Appendectomy       Home Meds: Prior to Admission medications   Medication Sig Start Date End Date Taking? Authorizing Provider  aspirin 81 MG tablet Take 81 mg by mouth daily.    Historical Provider, MD  cloNIDine (CATAPRES) 0.1 MG tablet Take 1 tablet (0.1 mg total) by mouth 2 (two) times daily. 05/10/13   Minna Merritts, MD  docusate sodium (COLACE) 100 MG capsule Take 100 mg by mouth 2 (two) times daily.    Historical Provider, MD  enalapril (VASOTEC) 5 MG tablet Take 5 mg by mouth daily.  07/16/13   Historical Provider, MD  Ferrous Sulfate (IRON) 325 (65 FE) MG TABS Take 1 tablet by mouth daily.    Historical Provider, MD  glipiZIDE (GLUCOTROL XL) 10 MG 24 hr tablet Take 10 mg by mouth 2 (two) times daily.    Historical Provider, MD  hydrALAZINE (APRESOLINE) 100 MG tablet TAKE ONE TABLET BY MOUTH THREE TIMES DAILY 07/19/13   Minna Merritts, MD  Insulin Lispro Prot & Lispro (HUMALOG MIX 75/25 [place]) Inject 70 Units into the skin 2 (two) times daily.    Historical Provider, MD  metoprolol  (LOPRESSOR) 50 MG tablet Take 1 tablet (50 mg total) by mouth 2 (two) times daily. 02/08/14   Minna Merritts, MD  omeprazole (PRILOSEC) 40 MG capsule Take 40 mg by mouth daily.    Historical Provider, MD  pravastatin (PRAVACHOL) 40 MG tablet Take two tablets daily.    Historical Provider, MD  Rivaroxaban (XARELTO) 15 MG TABS tablet Take 1 tablet (15 mg total) by mouth daily. 05/05/12   Minna Merritts, MD  torsemide (DEMADEX) 20 MG tablet TAKE ONE TABLET BY MOUTH TWICE DAILY AS NEEDED 03/21/14   Minna Merritts, MD    Inpatient Medications:  . aspirin EC  81 mg Oral Daily  . cloNIDine  0.1 mg Oral BID  . docusate sodium  100 mg Oral BID  . enalapril  5 mg Oral Daily  . ferrous sulfate  325 mg Oral Daily  . glipiZIDE  10 mg Oral BID  . hydrALAZINE  100 mg Oral TID  . insulin aspart  0-15 Units  Subcutaneous TID WC  . insulin aspart protamine- aspart  50 Units Subcutaneous BID WC  . metoprolol  50 mg Oral BID  . pantoprazole  40 mg Oral QAC breakfast  . pravastatin  40 mg Oral Daily  . Rivaroxaban  15 mg Oral Q supper  . sodium chloride  3 mL Intravenous Q12H  . torsemide  20 mg Oral Daily      Allergies:  Allergies  Allergen Reactions  . No Known Allergies     Social History   Social History  . Marital Status: Married    Spouse Name: N/A  . Number of Children: N/A  . Years of Education: N/A   Occupational History  . Not on file.   Social History Main Topics  . Smoking status: Former Smoker -- 0.25 packs/day for 10 years    Types: Cigars    Quit date: 05/06/1970  . Smokeless tobacco: Never Used  . Alcohol Use: No  . Drug Use: No  . Sexual Activity: Not on file   Other Topics Concern  . Not on file   Social History Narrative   Lives in [place] with his wife.     Family History  Problem Relation Age of Onset  . Heart attack Brother   . Hypertension    . Diabetes Mother   . Diabetes Sister      Review of Systems: Review of Systems  Constitutional:  Positive for weight loss and malaise/fatigue. Negative for fever, chills and diaphoresis.  HENT: Negative for congestion.   Eyes: Negative for discharge and redness.  Respiratory: Positive for shortness of breath. Negative for cough, hemoptysis, sputum production and wheezing.   Cardiovascular: Positive for chest pain. Negative for palpitations, orthopnea, claudication, leg swelling and PND.  Gastrointestinal: Negative for heartburn, nausea and vomiting.  Musculoskeletal: Negative for falls.  Skin: Negative for rash.  Neurological: Positive for weakness. Negative for dizziness, tingling, tremors, sensory change, speech change and focal weakness.  Endo/Heme/Allergies: Does not bruise/bleed easily.  Psychiatric/Behavioral: The patient is not nervous/anxious.   All other systems reviewed and are negative.    Labs:  Recent Labs  12/22/14 2230 12/23/14 0149 12/23/14 0719  TROPONINI 0.03 0.16* 0.03   Lab Results  Component Value Date   WBC 8.0 12/22/2014   HGB 11.3* 12/22/2014   HCT 33.9* 12/22/2014   MCV 83.1 12/22/2014   PLT 354 12/22/2014     Recent Labs Lab 12/22/14 2230  NA 136  K 3.9  CL 100*  CO2 28  BUN 32*  CREATININE 2.00*  CALCIUM 9.1  GLUCOSE 232*   Lab Results  Component Value Date   CHOL 81 01/03/2014   HDL 16* 01/03/2014   LDLCALC 43 01/03/2014   TRIG 111 01/03/2014   No results found for: DDIMER  Radiology/Studies:  Ct Angio Chest Aorta W/cm &/or Wo/cm  12/22/2014   CLINICAL DATA:  Midsternal chest pain beginning at 1900 hours. History of atrial fibrillation.  EXAM: CT ANGIOGRAPHY CHEST WITH CONTRAST  TECHNIQUE: Multidetector CT imaging of the chest was performed using the standard protocol during bolus administration of intravenous contrast. Multiplanar CT image reconstructions and MIPs were obtained to evaluate the vascular anatomy.  CONTRAST:  53mL OMNIPAQUE IOHEXOL 350 MG/ML SOLN  COMPARISON:  Chest radiograph October 05, 2014  FINDINGS: MEDIASTINUM:  No abnormal density along the thoracic aorta by noncontrast CT. Moderate calcific atherosclerosis of the aortic arch. The thoracic aorta is normal in caliber, no evidence of dissection, aneurysm, suspicious luminal irregularity or  contrast extravasation. Origins of the innominate artery, LEFT subclavian and LEFT Common carotid artery are patent. However, occluded LEFT vertebral artery at the origin. The heart size is mildly enlarged, moderate coronary artery calcifications. Mild pericardial thickening without frank effusion. No central pulmonary embolus though, not tailored for evaluation. Mediastinal lymphadenopathy, 14 mm RIGHT tracheal, 16 mm RIGHT tracheal lymph nodes, as sub carinal 20 mm short axis lymph node, subcentimeter aortopulmonary window lymph nodes.  LUNGS: Tracheobronchial tree is patent, no pneumothorax. Small RIGHT, moderate layering LEFT pleural effusion. Enhancing atelectasis LEFT lower lobe, and to lesser extent lingula, with 14 mm enhancing nodular density, axial 112/162.  SOFT TISSUES AND OSSEOUS STRUCTURES: Punctate splenic granulomas. Thickened appearance of the LEFT adrenal gland without discrete nodule. Mild degenerative change of the thoracic spine. No destructive bony lesions. 17 mm LEFT thyroid nodule.  Review of the MIP images confirms the above findings.  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 No convincing evidence of acute vascular process.  Moderate LEFT, small RIGHT pleural effusions. Enhancing atelectasis. 14 mm lingular nodular density could represent parenchymal nodule or, round atelectasis.  Mediastinal lymphadenopathy, though this may be reactive, recommend follow-up.  17 mm LEFT thyroid nodule for which follow up thyroid sonogram is recommended on a nonemergent basis.   Electronically Signed   By: Elon Alas M.D.   On: 12/22/2014 23:44    EKG: Afib with RVR, 117 bpm, nonspecific lateral st/t changes Telemetry: Afib, 70's, brief episode of  Afib with RVR into the 150's at 1:50 AM lasting a few minutes  Weights: [facility] Weights   12/22/14 2220 12/23/14 0131  Weight: 166 lb (75.297 kg) 170 lb (77.111 kg)     Physical Exam: Blood pressure 121/65, pulse 60, temperature 98.4 F (36.9 C), temperature source Oral, resp. rate 18, height 5\' 7"  (1.702 m), weight 170 lb (77.111 kg), SpO2 98 %. Body mass index is 26.62 kg/(m^2). General: Well developed, well nourished, in no acute distress. Head: Normocephalic, atraumatic, sclera non-icteric, no xanthomas, nares are without discharge.  Neck: Negative for carotid bruits. JVD not elevated. Lungs: Bilateral crackles. Breathing is unlabored. Heart: Irregularly-irregular, with S1 S2. No murmurs, rubs, or gallops appreciated. Abdomen: Soft, non-tender, non-distended with normoactive bowel sounds. No hepatomegaly. No rebound/guarding. No obvious abdominal masses. Msk:  Strength and tone appear normal for age. Extremities: No clubbing or cyanosis. No edema.  Distal pedal pulses are 2+ and equal bilaterally. Neuro: Alert and oriented X 3. No facial asymmetry. No focal deficit. Moves all extremities spontaneously. Psych:  Responds to questions appropriately with a normal affect.    Assessment and Plan:  81 year old male with history of permanent afib dating back to 12/2011 and has been on renal-dosed xarelto since, chronic diastolic CHF, CKD stage III-IV, anemia of chronic disease, HTN, HLD, DM2, GERD, and Bells' Palsy who presented to [facility] on 9/29 with complaints of chest pain.  1. Unstable angina: -Patient with new cardiomyopathy with new depressed EF of 40-45% and probable hypokinesis of the mid-apicalanteroseptal, anterior, and apical myocardium -Findings are new when compared to echo on 11/30/2012 -He has CKD stage III-IV which does complicate matters when considering ischemic evaluation, as cardiac catheterization certainly could place him a significantly increased risk of need for dialysis   -Dr. Fletcher Anon, MD plans to discuss treatment options with patient, possible intervention vs medical management -If cardiac cath is pursued will need to hold Xarelto and torsemide  -He continues to have intermittent chest pain and has been receiving prn morphine  -  Add Imdur 30 mg daily  2. Elevated troponin: -Initial troponin negative, 2nd mildly elevated at 1:50 AM in the setting of Afib with RVR, third set negative -Possibly in the setting of demand ischemia secondary to Afib with RVR -Cannot rule out high risk ischemia -Plan to discuss possible intervention as above  3. Acute on chronic combined systolic and diastolic CHF: -Has been taking torsemide 20 mg prn and this has been working well -Pleural effusions possibly in the setting of #1 -Place patient on torsemide 20 mg daily at this time and monitor for urine output and SCr -Monitor for need of supplemental oxygen  -Add Imdur 30 mg daily as above  4. Chronic Afib with RVR: -Currently rate controlled -If intervention is pursued will need to hold Xarelto and place patient on heparin gtt -CHADSVASc at least 5 (CHF, HTN, DM, age x 2)  5. CKD stage III-IV: -Stable currently -Will need to monitor closely with diuresis and if intervention is pursued -If intervention is pursued consider gathering input from nephrology   6. Chronic anemia: -Stable   Signed, Christell Faith, PA-C Pager: [phone] 12/23/2014, 10:47 AM

## 2014-12-23 NOTE — Progress Notes (Signed)
Patient is currently being followed in the outpatient [facility] and has an appointment already scheduled on January 10, 2015 at 1:00pm.

## 2014-12-23 NOTE — Progress Notes (Signed)
Initial Nutrition Assessment   INTERVENTION:   Meals and Snacks: Cater to patient preferences Medical Food Supplement Therapy: recommend Ensure Enlive po BID, each supplement provides 350 kcal and 20 grams of protein   NUTRITION DIAGNOSIS:   Inadequate oral intake related to poor appetite, acute illness as evidenced by per patient/family report.  GOAL:   Patient will meet greater than or equal to 90% of their needs  MONITOR:    (Energy Intake, Anthropometrics, Digestive System, Electrolyte/Renal Profile, Glucose Profile)  REASON FOR ASSESSMENT:   Consult Poor PO  ASSESSMENT:    Pt admitted with chest pain, hypotension, syncope, bilateral pleural effusions rapid response called this AM as pt not responding; pt with CKD, chronic CHF  Past Medical History  Diagnosis Date  . Hypertension   . Diabetes mellitus without complication   . Hyperlipidemia   . History of gastroesophageal reflux (GERD)   . CKD (chronic kidney disease), stage III     a. stage III-IV  . Permanent atrial fibrillation     a. Dx 12/2011, Rate-controlled, chronic Xarelto (renal dosing).  . Bell's palsy   . Chronic diastolic CHF (congestive heart failure)     a. 11/2012 Echo: EF 60-65%, mod conc LVH, mildly dil LA/RA, mild Ao sclerosis w/o stenosis; b. stage III-IV    . Anemia of chronic disease     a. plan of oncology to start Procrit - received this during admission 12/2013  . Chronic atrial fibrillation   . Hyperlipidemia   . GERD (gastroesophageal reflux disease)   . Bell's palsy   . Amputated finger      Diet Order:  Diet heart healthy/carb modified Room service appropriate?: Yes; Fluid consistency:: Thin   Energy Intake: no po intake recorded, did not eat breakfast this AM due to mental status  Food and Nutrition Related history: unable to assess  Nutrition Focused Physical Exam:  Unable to complete Nutrition-Focused physical exam at this time.    Skin:  Reviewed, no issues  Last BM:   9/29   Electrolyte and Renal Profile:  Recent Labs Lab 12/22/14 2230  BUN 32*  CREATININE 2.00*  NA 136  K 3.9   Glucose Profile:   Recent Labs  12/23/14 0140 12/23/14 0738 12/23/14 1126  GLUCAP 156* 159* 108*    Meds: ss novolog, glucotrol, novolog 70/30, NS at 50 ml/hr  Height:   Ht Readings from Last 1 Encounters:  12/23/14 5\' 7"  (1.702 m)    Weight: weight trend as per weight encounters; 21.2% wt loss in past year per weight encounters  Wt Readings from Last 1 Encounters:  12/23/14 170 lb (77.111 kg)    Filed Weights   12/22/14 2220 12/23/14 0131  Weight: 166 lb (75.297 kg) 170 lb (77.111 kg)   Wt Readings from Last 10 Encounters:  12/23/14 170 lb (77.111 kg)  10/18/14 180 lb (81.647 kg)  10/05/14 185 lb (83.915 kg)  09/19/14 182 lb (82.555 kg)  08/23/14 191 lb (86.637 kg)  08/08/14 189 lb 8 oz (85.957 kg)  06/23/14 191 lb (86.637 kg)  06/27/14 198 lb 6.6 oz (90 kg)  02/08/14 213 lb 8 oz (96.843 kg)  01/11/14 216 lb 8 oz (98.204 kg)    BMI:  Body mass index is 26.62 kg/(m^2).  Estimated Nutritional Needs:   Kcal:  [id number] kcals (BEE 1428, 1.2 AF, 1.1-1.3 IF)   Protein:  85-100 g (1.1-1.3 g/kg)   Fluid:  1925-2310 ml (25-30 ml/kg)    HIGH Care Level  Cate  Rhodes MS, [place], LDN [phone] Pager

## 2014-12-23 NOTE — Progress Notes (Signed)
Pharmacy tech called and notified of need for PTA med rec completion.

## 2014-12-23 NOTE — ED Notes (Signed)
MD Reddy at bedside.

## 2014-12-24 DIAGNOSIS — E1159 Type 2 diabetes mellitus with other circulatory complications: Secondary | ICD-10-CM

## 2014-12-24 DIAGNOSIS — J90 Pleural effusion, not elsewhere classified: Secondary | ICD-10-CM | POA: Insufficient documentation

## 2014-12-24 DIAGNOSIS — D62 Acute posthemorrhagic anemia: Secondary | ICD-10-CM

## 2014-12-24 LAB — CBC WITH DIFFERENTIAL/PLATELET
BASOS ABS: 0 10*3/uL (ref 0–0.1)
EOS ABS: 0.2 10*3/uL (ref 0–0.7)
HEMATOCRIT: 26 % — AB (ref 40.0–52.0)
Hemoglobin: 9 g/dL — ABNORMAL LOW (ref 13.0–18.0)
Lymphocytes Relative: 9 %
Lymphs Abs: 0.7 10*3/uL — ABNORMAL LOW (ref 1.0–3.6)
MCH: 28.8 pg (ref 26.0–34.0)
MCHC: 34.7 g/dL (ref 32.0–36.0)
MCV: 82.9 fL (ref 80.0–100.0)
MONO ABS: 0.4 10*3/uL (ref 0.2–1.0)
NEUTROS ABS: 6.6 10*3/uL — AB (ref 1.4–6.5)
Neutrophils Relative %: 83 %
PLATELETS: 269 10*3/uL (ref 150–440)
RBC: 3.14 MIL/uL — ABNORMAL LOW (ref 4.40–5.90)
RDW: 16.5 % — AB (ref 11.5–14.5)
WBC: 8 10*3/uL (ref 3.8–10.6)

## 2014-12-24 LAB — BASIC METABOLIC PANEL
ANION GAP: 4 — AB (ref 5–15)
BUN: 40 mg/dL — ABNORMAL HIGH (ref 6–20)
CALCIUM: 8 mg/dL — AB (ref 8.9–10.3)
CO2: 27 mmol/L (ref 22–32)
CREATININE: 2.4 mg/dL — AB (ref 0.61–1.24)
Chloride: 105 mmol/L (ref 101–111)
GFR, EST AFRICAN AMERICAN: 28 mL/min — AB (ref >60)
GFR, EST NON AFRICAN AMERICAN: 24 mL/min — AB (ref >60)
Glucose, Bld: 99 mg/dL (ref 65–99)
Potassium: 4 mmol/L (ref 3.5–5.1)
SODIUM: 136 mmol/L (ref 135–145)

## 2014-12-24 LAB — GLUCOSE, CAPILLARY
GLUCOSE-CAPILLARY: 95 mg/dL (ref 65–99)
GLUCOSE-CAPILLARY: 156 mg/dL — AB (ref 65–99)
Glucose-Capillary: 161 mg/dL — ABNORMAL HIGH (ref 65–99)
Glucose-Capillary: 132 mg/dL — ABNORMAL HIGH (ref 65–99)

## 2014-12-24 NOTE — Progress Notes (Signed)
[facility] NAME: Patrick [name]    MR#:  [id number]  DATE OF BIRTH:  09/24/1931  SUBJECTIVE:  CHIEF COMPLAINT:   Chief Complaint  Patient presents with  . Chest Pain  Admitted for chest pain. Transferred to ICU 9/30 due to hypotension and rapid response. Now he is awake and back to baseline. No further chest pain. No recent change in meds.  REVIEW OF SYSTEMS:    Review of Systems  Unable to perform ROS: mental acuity    DRUG ALLERGIES:   Allergies  Allergen Reactions  . No Known Allergies     VITALS:  Blood pressure 114/75, pulse 86, temperature 99.3 F (37.4 C), temperature source Oral, resp. rate 28, height 5\' 7"  (1.702 m), weight 80.1 kg (176 lb 9.4 oz), SpO2 98 %.  PHYSICAL EXAMINATION:   Physical Exam  GENERAL:  81 y.o.-year-old patient lying in the bed with no acute distress.  EYES: Pupils equal, round, reactive to light and accommodation. No scleral icterus. Extraocular muscles intact.  HEENT: Head atraumatic, normocephalic. Oropharynx and nasopharynx clear.  NECK:  Supple, no jugular venous distention. No thyroid enlargement, no tenderness.  LUNGS: Normal WOB. Clear. CARDIOVASCULAR: S1, S2 normal. No murmurs, rubs, or gallops.  ABDOMEN: Soft, nontender, nondistended. Bowel sounds present. No organomegaly or mass.  EXTREMITIES: No cyanosis, clubbing or edema b/l.    NEUROLOGIC: Moves all extremities  PSYCHIATRIC: The patient is awake and sitting up SKIN: No obvious rash   LABORATORY PANEL:   CBC  Recent Labs Lab 12/24/14 0502  WBC 8.0  HGB 9.0*  HCT 26.0*  PLT 269   ------------------------------------------------------------------------------------------------------------------  Chemistries   Recent Labs Lab 12/24/14 0502  NA 136  K 4.0  CL 105  CO2 27  GLUCOSE 99  BUN 40*  CREATININE 2.40*  CALCIUM 8.0*    ------------------------------------------------------------------------------------------------------------------  Cardiac Enzymes  Recent Labs Lab 12/23/14 2101  TROPONINI 0.03   ------------------------------------------------------------------------------------------------------------------  RADIOLOGY:  Dg Chest Port 1 View  12/23/2014   CLINICAL DATA:  Status post PICC line placement, history of atrial fibrillation, respiratory failure, diabetes, chronic renal insufficiency  EXAM: PORTABLE CHEST 1 VIEW  COMPARISON:  Chest x-ray of December 23, 2014  FINDINGS: The left-sided PICC line has its tip projecting over the midportion of the SVC. There is no postprocedure pneumothorax. There is a small left pleural effusion which is stable. The pulmonary interstitial markings remain mildly increased bilaterally. The cardiac silhouette is enlarged. The pulmonary vascularity is mildly engorged.  IMPRESSION: There is no postprocedure complication following left-sided PICC line placement.   Electronically Signed   By: David  Martinique M.D.   On: 12/23/2014 17:01   Dg Chest Port 1 View  12/23/2014   CLINICAL DATA:  Shortness of breath.  EXAM: PORTABLE CHEST - 1 VIEW  COMPARISON:  CTA chest 12/22/2014.  FINDINGS: The heart is enlarged. Atherosclerotic calcifications are again noted. B left pleural effusion and associated airspace disease are noted. The right lung remains clear apart from minimal basilar atelectasis. The lung volumes are low. The visualized soft tissues and bony thorax are unremarkable.  IMPRESSION: 1. Persistent left pleural effusion and associated airspace disease. While this may represent atelectasis, early infection is not excluded. 2. Minimal atelectasis at the right lung base. 3. Atherosclerosis. 4. Low lung volumes.   Electronically Signed   By: San Morelle M.D.   On: 12/23/2014 12:21   Ct Angio Chest Aorta W/cm &/or Wo/cm  12/22/2014  CLINICAL DATA:  Midsternal chest pain  beginning at 1900 hours. History of atrial fibrillation.  EXAM: CT ANGIOGRAPHY CHEST WITH CONTRAST  TECHNIQUE: Multidetector CT imaging of the chest was performed using the standard protocol during bolus administration of intravenous contrast. Multiplanar CT image reconstructions and MIPs were obtained to evaluate the vascular anatomy.  CONTRAST:  67mL OMNIPAQUE IOHEXOL 350 MG/ML SOLN  COMPARISON:  Chest radiograph October 05, 2014  FINDINGS: MEDIASTINUM: No abnormal density along the thoracic aorta by noncontrast CT. Moderate calcific atherosclerosis of the aortic arch. The thoracic aorta is normal in caliber, no evidence of dissection, aneurysm, suspicious luminal irregularity or contrast extravasation. Origins of the innominate artery, LEFT subclavian and LEFT Common carotid artery are patent. However, occluded LEFT vertebral artery at the origin. The heart size is mildly enlarged, moderate coronary artery calcifications. Mild pericardial thickening without frank effusion. No central pulmonary embolus though, not tailored for evaluation. Mediastinal lymphadenopathy, 14 mm RIGHT tracheal, 16 mm RIGHT tracheal lymph nodes, as sub carinal 20 mm short axis lymph node, subcentimeter aortopulmonary window lymph nodes.  LUNGS: Tracheobronchial tree is patent, no pneumothorax. Small RIGHT, moderate layering LEFT pleural effusion. Enhancing atelectasis LEFT lower lobe, and to lesser extent lingula, with 14 mm enhancing nodular density, axial 112/162.  SOFT TISSUES AND OSSEOUS STRUCTURES: Punctate splenic granulomas. Thickened appearance of the LEFT adrenal gland without discrete nodule. Mild degenerative change of the thoracic spine. No destructive bony lesions. 17 mm LEFT thyroid nodule.  Review of the MIP images confirms the above findings.  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 No convincing evidence of acute vascular process.  Moderate LEFT, small RIGHT pleural effusions. Enhancing atelectasis.  14 mm lingular nodular density could represent parenchymal nodule or, round atelectasis.  Mediastinal lymphadenopathy, though this may be reactive, recommend follow-up.  17 mm LEFT thyroid nodule for which follow up thyroid sonogram is recommended on a nonemergent basis.   Electronically Signed   By: Elon Alas M.D.   On: 12/22/2014 23:44     ASSESSMENT AND PLAN:  13 m with CAD, systolic CHF, CKD 3 here with CP  * Unstable angina with new wall motion abnormalities on Echo. Lower EF than prior Echo. ASA, BB, Statin. Cardiology on board. Troponin normal. Will need cath for evaluation but has CKD3.  * ARF over CKD3 likely from ATN due to drop in BP yesterday. Hold ACEi. Repeat in AM. Should improve as his BP is better today  * Bilateral pleural effusions on CT chest. Chronic versus acute. Pulmonary consulted. Pleural tap, left when stable.  * Permanent atrial flutter ablation, on Eliquis. Heart rate in reasonable control, no acute problems. Continue home medications including Eliquis.  * Hypotension and syncope - resolved Clonidine held.  * Diabetes mellitus type 2 stable on home medications. Continue same. Follow up blood sugars closely.  * Hypertension, stable on home medications. Continue same.   * Chronic systolic chf Monitor for fluid overload.  Critically ill with high risk for cardiac arrest and death.  Discussed with Wife as patient unable to talk.  Per patient's wishes she has requested he not be intubated but ok to resuscitate with CPD/meds  All the records are reviewed and case discussed with Care Management/Social Workerr. Management plans discussed with the patient, family and they are in agreement.  CODE STATUS: Limited code. No Intubation/Vent support.  DVT Prophylaxis: SCDs  TOTAL TIME TAKING CARE OF THIS PATIENT: 35 minutes.     Hillary Bow R M.D on 12/24/2014 at  10:12 AM  Between 7am to 6pm - Pager - [phone]  After 6pm go to  [email or web address] - password [email or web address] [facility]  Office  [phone]  CC: Primary care physician; [place] Pink, MD    Note: This dictation was prepared with Dragon dictation along with smaller phrase technology. Any transcriptional errors that result from this process are unintentional.

## 2014-12-24 NOTE — Consult Note (Addendum)
[facility] Consultation     ASSESSMENT/PLAN   81 year old male who is transferred to the intensive care unit with acute respiratory failure secondary to pulmonary edema from congestive heart failure, and volume overload. The patient is now doing better. It was also noted to have a moderate size left pleural effusion.  PULMONARY  A: Pleural effusion, atypical predominantly left-sided. P:   -Would consider a diagnostic thoracentesis on the left side. Once the patient's other acute issues have improved. Currently the patient does not appear to have acute respiratory distress secondary to this effusion, family is considering if they want thoracentesis done. If so, will need to hold xarelto and schedule the procedure the following day.   -Patient is stable to be transferred to the floor from respiratory standpoint. We'll order a left-sided diagnostic thoracentesis.  CARDIOVASCULAR  A: Diastolic congestive heart failure, history is negative. Echocardiogram on September 30 showed ejection fraction 45 to 50%. -Chronic atrial fibrillation, patient was on [place] at home. -Essential hypertension P:  Continue management per cardiology service. Continue antihypertensives if tolerated by blood pressure.  RENAL A:  Chronic kidney disease P:     GASTROINTESTINAL A:  GERD P:   Continue Protonix  HEMATOLOGIC A: Chronic anemia P:    INFECTIOUS   ENDOCRINE A: Diabetes mellitus P:   Continue long-acting insulin plus sliding scale, the patient is also on glipizide.  NEUROLOGIC Dementia   MAJOR EVENTS/TEST RESULTS: -Echocardiograms 12/23/2014 which showed ejection fraction of 45 to 50%. -CT chest* 12/22/2014 showed a moderate left-sided pleural effusion with compressive atelectasis. Also left chronic vertebral artery occlusion. -ABG 12/23/2014 showed a pH of 7.46 with a PCO2 of 39, bicarbonate 27.7, a PO2 of 56  INDWELLING DEVICES::  MICRO  DATA: MRSA PCR  Urine  Blood Resp   ANTIMICROBIALS:    ---------------------------------------  ---------------------------------------   Name: LATERRANCE REISTER MRN: [id number] DOB: 02/12/1932    ADMISSION DATE:  12/22/2014 CONSULTATION DATE:  12/23/14  REFERRING MD :  Dr. Vianne Bulls  CHIEF COMPLAINT:  Pleural effusion.    HISTORY OF PRESENT ILLNESS:    Pt looking and feeling better today.   PAST MEDICAL HISTORY :    Prior to Admission medications   Medication Sig Start Date End Date Taking? Authorizing Provider  aspirin 81 MG tablet Take 81 mg by mouth daily.    Historical Provider, MD  cloNIDine (CATAPRES) 0.1 MG tablet Take 1 tablet (0.1 mg total) by mouth 2 (two) times daily. 05/10/13   Minna Merritts, MD  docusate sodium (COLACE) 100 MG capsule Take 100 mg by mouth 2 (two) times daily.    Historical Provider, MD  enalapril (VASOTEC) 5 MG tablet Take 5 mg by mouth daily.  07/16/13   Historical Provider, MD  Ferrous Sulfate (IRON) 325 (65 FE) MG TABS Take 1 tablet by mouth daily.    Historical Provider, MD  glipiZIDE (GLUCOTROL XL) 10 MG 24 hr tablet Take 10 mg by mouth 2 (two) times daily.    Historical Provider, MD  hydrALAZINE (APRESOLINE) 100 MG tablet TAKE ONE TABLET BY MOUTH THREE TIMES DAILY 07/19/13   Minna Merritts, MD  Insulin Lispro Prot & Lispro (HUMALOG MIX 75/25 [place]) Inject 70 Units into the skin 2 (two) times daily.    Historical Provider, MD  metoprolol (LOPRESSOR) 50 MG tablet Take 1 tablet (50 mg total) by mouth 2 (two) times daily. 02/08/14   Minna Merritts, MD  omeprazole (PRILOSEC) 40 MG capsule Take 40  mg by mouth daily.    Historical Provider, MD  pravastatin (PRAVACHOL) 40 MG tablet Take two tablets daily.    Historical Provider, MD  Rivaroxaban (XARELTO) 15 MG TABS tablet Take 1 tablet (15 mg total) by mouth daily. 05/05/12   Minna Merritts, MD  torsemide (DEMADEX) 20 MG tablet TAKE ONE TABLET BY MOUTH TWICE DAILY AS NEEDED 03/21/14    Minna Merritts, MD   Allergies  Allergen Reactions  . No Known Allergies    REVIEW OF SYSTEMS:   Constitutional: Feels well. Cardiovascular: No chest pain.  Pulmonary: Denies dyspnea.   The remainder of systems were reviewed and were found to be negative other than what is documented in the HPI.    VITAL SIGNS: Temp:  [97.8 F (36.6 C)-99.3 F (37.4 C)] 99.3 F (37.4 C) (09/30 1600) Pulse Rate:  [49-94] 86 (10/01 0600) Resp:  [13-32] 28 (10/01 0600) BP: (78-135)/(42-80) 114/75 mmHg (10/01 0600) SpO2:  [89 %-99 %] 98 % (10/01 0600) Weight:  [80.1 kg (176 lb 9.4 oz)] 80.1 kg (176 lb 9.4 oz) (10/01 0500) HEMODYNAMICS:   VENTILATOR SETTINGS:   INTAKE / OUTPUT:  Intake/Output Summary (Last 24 hours) at 12/24/14 1020 Last data filed at 12/24/14 0600  Gross per 24 hour  Intake   1413 ml  Output    150 ml  Net   1263 ml    Physical Examination:   VS: BP 114/75 mmHg  Pulse 86  Temp(Src) 99.3 F (37.4 C) (Oral)  Resp 28  Ht 5\' 7"  (1.702 m)  Wt 80.1 kg (176 lb 9.4 oz)  BMI 27.65 kg/m2  SpO2 98%  General Appearance: No distress  Neuro:without focal findings, mental status, speech normal,   HEENT: PERRLA, EOM intact, no ptosis, no other lesions noticed;  Pulmonary: normal breath sounds., diaphragmatic excursion normal. CardiovascularNormal S1,S2.  No m/r/g.    Abdomen: Benign, Soft, non-tender,  Renal:  No costovertebral tenderness  GU:  Not performed at this time. Endoc: No evident thyromegaly, no signs of acromegaly. Skin:   warm, no rashes, no ecchymosis  Extremities: normal, no cyanosis, clubbing, no edema, warm with normal capillary refill.    LABS: Reviewed   LABORATORY PANEL:   CBC  Recent Labs Lab 12/24/14 0502  WBC 8.0  HGB 9.0*  HCT 26.0*  PLT 269    Chemistries   Recent Labs Lab 12/24/14 0502  NA 136  K 4.0  CL 105  CO2 27  GLUCOSE 99  BUN 40*  CREATININE 2.40*  CALCIUM 8.0*     Recent Labs Lab 12/23/14 1126  12/23/14 1636 12/23/14 1638 12/23/14 1701 12/23/14 2153 12/24/14 0703  GLUCAP 108* 47* 50* 79 120* 95    Recent Labs Lab 12/23/14 1130  PHART 7.46*  PCO2ART 39  PO2ART 56*   No results for input(s): AST, ALT, ALKPHOS, BILITOT, ALBUMIN in the last 168 hours.  Cardiac Enzymes  Recent Labs Lab 12/23/14 2101  TROPONINI 0.03    RADIOLOGY:       --Marda Stalker, MD.  Board Certified in Internal Medicine, Pulmonary Medicine, [place], and Sleep Medicine.  Pager [phone] [facility] Office Number: [phone]  Patricia Pesa, M.D.  Vilinda Boehringer, M.D.  Merton Border, M.D   12/24/2014, 10:20 AM

## 2014-12-24 NOTE — Progress Notes (Signed)
Patient: Patrick [name] / Admit Date: 12/22/2014 / Date of Encounter: 12/24/2014, 11:52 AM   Subjective: Feels well this AM, no significant SOB at rest Foley catheter in place, CT scan showing moderate sized pleural effusion on the left No further chest pain symptoms Yesterday received clonidine and morphine with hypotension, mental status changes, moved to the ICU, now back to baseline  Review of Systems: Review of Systems  Constitutional: Negative.   Respiratory: Positive for cough.   Cardiovascular: Negative.   Gastrointestinal: Negative.   Musculoskeletal: Negative.   Neurological: Negative.   Psychiatric/Behavioral: Negative.   All other systems reviewed and are negative.   Objective: Telemetry: atrial fib Physical Exam: Blood pressure 124/62, pulse 89, temperature 97.7 F (36.5 C), temperature source Oral, resp. rate 26, height 5\' 7"  (1.702 m), weight 176 lb 9.4 oz (80.1 kg), SpO2 96 %. Body mass index is 27.65 kg/(m^2). General: Well developed, well nourished, in no acute distress. Head: Normocephalic, atraumatic, sclera non-icteric, no xanthomas, nares are without discharge. Neck: Negative for carotid bruits. JVP not elevated. Lungs: Dull at the left base 1/2 [place] up Breathing is unlabored. Heart: irregular RR without murmurs, rubs, or gallops.  Abdomen: Soft, non-tender, non-distended with normoactive bowel sounds. No rebound/guarding. Extremities: No clubbing or cyanosis. No edema. Distal pedal pulses are 2+ and equal bilaterally. Neuro: Alert and oriented X 3. Moves all extremities spontaneously. Psych:  Responds to questions appropriately with a normal affect.   Intake/Output Summary (Last 24 hours) at 12/24/14 1152 Last data filed at 12/24/14 1100  Gross per 24 hour  Intake   1663 ml  Output    150 ml  Net   1513 ml    Inpatient Medications:  . aspirin EC  81 mg Oral Daily  . docusate sodium  100 mg Oral BID  . feeding supplement (NEPRO  CARB STEADY)  237 mL Oral TID BM  . ferrous sulfate  325 mg Oral Daily  . glipiZIDE  10 mg Oral BID  . insulin aspart  0-15 Units Subcutaneous TID WC  . insulin aspart protamine- aspart  50 Units Subcutaneous BID WC  . metoprolol  50 mg Oral BID  . pantoprazole  40 mg Oral QAC breakfast  . pravastatin  40 mg Oral Daily  . Rivaroxaban  15 mg Oral Q supper  . sodium chloride  3 mL Intravenous Q12H  . torsemide  20 mg Oral Daily   Infusions:  . sodium chloride 50 mL/hr at 12/23/14 1752    Labs:  Recent Labs  12/22/14 2230 12/24/14 0502  NA 136 136  K 3.9 4.0  CL 100* 105  CO2 28 27  GLUCOSE 232* 99  BUN 32* 40*  CREATININE 2.00* 2.40*  CALCIUM 9.1 8.0*   No results for input(s): AST, ALT, ALKPHOS, BILITOT, PROT, ALBUMIN in the last 72 hours.  Recent Labs  12/22/14 2230 12/24/14 0502  WBC 8.0 8.0  NEUTROABS 6.8* 6.6*  HGB 11.3* 9.0*  HCT 33.9* 26.0*  MCV 83.1 82.9  PLT 354 269    Recent Labs  12/23/14 0719 12/23/14 1307 12/23/14 1722 12/23/14 2101  TROPONINI 0.03 <0.03 0.03 0.03   Invalid input(s): POCBNP No results for input(s): HGBA1C in the last 72 hours.   Weights: Filed Weights   12/22/14 2220 12/23/14 0131 12/24/14 0500  Weight: 166 lb (75.297 kg) 170 lb (77.111 kg) 176 lb 9.4 oz (80.1 kg)     Radiology/Studies:  Dg Chest Port 1 View  12/23/2014  CLINICAL DATA:  Status post PICC line placement, history of atrial fibrillation, respiratory failure, diabetes, chronic renal insufficiency  EXAM: PORTABLE CHEST 1 VIEW  COMPARISON:  Chest x-ray of December 23, 2014  FINDINGS: The left-sided PICC line has its tip projecting over the midportion of the SVC. There is no postprocedure pneumothorax. There is a small left pleural effusion which is stable. The pulmonary interstitial markings remain mildly increased bilaterally. The cardiac silhouette is enlarged. The pulmonary vascularity is mildly engorged.  IMPRESSION: There is no postprocedure complication  following left-sided PICC line placement.   Electronically Signed   By: David  Martinique M.D.   On: 12/23/2014 17:01   Dg Chest Port 1 View  12/23/2014   CLINICAL DATA:    IMPRESSION: 1. Persistent left pleural effusion and associated airspace disease. While this may represent atelectasis, early infection is not excluded. 2. Minimal atelectasis at the right lung base. 3. Atherosclerosis. 4. Low lung volumes.   Electronically Signed   By: San Morelle M.D.   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 No convincing evidence of acute vascular process.  Moderate LEFT, small RIGHT pleural effusions. Enhancing atelectasis. 14 mm lingular nodular density could represent parenchymal nodule or, round atelectasis.  Mediastinal lymphadenopathy, though this may be reactive, recommend follow-up.  17 mm LEFT thyroid nodule for which follow up thyroid sonogram is recommended on a nonemergent basis.   Electronically Signed   By: Elon Alas M.D.   On: 12/22/2014 23:44     Assessment and Plan  81 y.o. male with history of permanent afib dating back to 12/2011 and has been on renal-dosed xarelto since, chronic diastolic CHF, CKD stage III-IV, anemia of chronic disease, HTN, HLD, DM2, GERD, and Bells' Palsy who presented to [facility] on 9/29 with complaints of chest pain.  1) pleural effusion Chronic, likely secondary to anemia, as well as chronic systolic and diastolic CHF. X-ray July 2016 with large effusion, most recent x-ray and CT with moderate effusion (improved) -Patient does report some cough, feels "cold in his back", denies significant shortness of breath. He is sedentary at baseline. -Discussed risk and benefit of thoracentesis with family, talked with Dr. Juanell Fairly, pulmonary. We will hold the anticoagulation tonight, further discussion tomorrow concerning possible thoracentesis on Monday. ----One option would  be for thoracentesis on Monday then starting torsemide 3 days per week for CHF in effort to minimize recurrence of effusion. Currently he only takes torsemide as needed for leg edema and has not been taking very much. We have minimized use of torsemide given underlying renal dysfunction.  --Other option would be to delay thoracentesis, start torsemide on a more regular basis 2-3 times per week, reimage pleural effusion and have patient seen by pulmonary as an outpatient. If thoracentesis needed with no improvement of effusion, this could be scheduled as an outpatient.  2) atrial fibrillation Chronic, on anticoagulation Rate adequately controlled.  Likely contributing to pleural effusion  3)  chest pain , atypical   CT scan with some degree of coronary artery disease,  cardiac enzymes essentially normal Echocardiogram with wall motion abnormality, mildly depressed ejection fraction Could consider outpatient stress test, pharmacological  4) anemia Chronic, managed by hematology Receives Epogen infusion on a regular basis Baseline hematocrit of 9 Likely contributing to pleural effusion  5) chronic kidney disease, stage IV Baseline creatinine of 2 Received torsemide, now 2.4 We'll need to  monitor his renal function closely. Hold torsemide for additional climb in BUN and creatinine   Signed, Esmond Plants. MD [facility] 12/24/2014, 11:52 AM

## 2014-12-25 DIAGNOSIS — N184 Chronic kidney disease, stage 4 (severe): Secondary | ICD-10-CM

## 2014-12-25 DIAGNOSIS — J948 Other specified pleural conditions: Secondary | ICD-10-CM

## 2014-12-25 LAB — URINALYSIS COMPLETE WITH MICROSCOPIC (ARMC ONLY)
Bilirubin Urine: NEGATIVE
GLUCOSE, UA: NEGATIVE mg/dL
Ketones, ur: NEGATIVE mg/dL
Nitrite: NEGATIVE
PH: 5 (ref 5.0–8.0)
Protein, ur: NEGATIVE mg/dL
Specific Gravity, Urine: 1.006 (ref 1.005–1.030)

## 2014-12-25 LAB — COMPREHENSIVE METABOLIC PANEL
ALBUMIN: 2.4 g/dL — AB (ref 3.5–5.0)
ALK PHOS: 63 U/L (ref 38–126)
ALT: 12 U/L — AB (ref 17–63)
AST: 19 U/L (ref 15–41)
Anion gap: 7 (ref 5–15)
BILIRUBIN TOTAL: 0.5 mg/dL (ref 0.3–1.2)
BUN: 46 mg/dL — AB (ref 6–20)
CO2: 25 mmol/L (ref 22–32)
CREATININE: 2.09 mg/dL — AB (ref 0.61–1.24)
Calcium: 8 mg/dL — ABNORMAL LOW (ref 8.9–10.3)
Chloride: 103 mmol/L (ref 101–111)
GFR calc Af Amer: 33 mL/min — ABNORMAL LOW (ref >60)
GFR, EST NON AFRICAN AMERICAN: 28 mL/min — AB (ref >60)
GLUCOSE: 84 mg/dL (ref 65–99)
POTASSIUM: 3.7 mmol/L (ref 3.5–5.1)
Sodium: 135 mmol/L (ref 135–145)
TOTAL PROTEIN: 6.2 g/dL — AB (ref 6.5–8.1)

## 2014-12-25 LAB — GLUCOSE, CAPILLARY
GLUCOSE-CAPILLARY: 94 mg/dL (ref 65–99)
GLUCOSE-CAPILLARY: 166 mg/dL — AB (ref 65–99)
GLUCOSE-CAPILLARY: 75 mg/dL (ref 65–99)
Glucose-Capillary: 82 mg/dL (ref 65–99)

## 2014-12-25 MED ORDER — SODIUM CHLORIDE 0.9 % IJ SOLN
10.0000 mL | Freq: Two times a day (BID) | INTRAMUSCULAR | Status: DC
  Administered 2014-12-25 – 2014-12-26 (×2): 10 mL via INTRAVENOUS

## 2014-12-25 NOTE — Consult Note (Signed)
[facility] Consultation     ASSESSMENT/PLAN   81 year old male who is transferred to the intensive care unit with acute respiratory failure secondary to pulmonary edema from congestive heart failure, and volume overload. The patient is now doing better. He was also noted to have a moderate size left pleural effusion.  PULMONARY  A: Pleural effusion, atypical predominantly left-sided. P:   -I discussed the procedure with the patient and family in detail, they are amenable to proceeding.- -I will place the order for a left-sided thoracentesis to be performed by interventional radiology. The patient can likely be his discharge after the procedure.  CARDIOVASCULAR  A: Diastolic congestive heart failure, history is negative. Echocardiogram on September 30 showed ejection fraction 45 to 50%. -Chronic atrial fibrillation, patient was on [facility] at home. -Essential hypertension P:  Continue management per cardiology service.   RENAL A:  Chronic kidney disease   MAJOR EVENTS/TEST RESULTS: -Echocardiograms 12/23/2014 which showed ejection fraction of 45 to 50%. -CT chest* 12/22/2014 showed a moderate left-sided pleural effusion with compressive atelectasis. Also left chronic vertebral artery occlusion. -ABG 12/23/2014 showed a pH of 7.46 with a PCO2 of 39, bicarbonate 27.7, a PO2 of 56   ---------------------------------------  ---------------------------------------   Name: Patrick [name] MRN: [id number] DOB: 1931/10/09    ADMISSION DATE:  12/22/2014 CONSULTATION DATE:  12/23/14    CHIEF COMPLAINT:  Pleural effusion.    HISTORY OF PRESENT ILLNESS:    Pt looking and feeling better today. He is awake and alert with no particular complaints.     Prior to Admission medications   Medication Sig Start Date End Date Taking? Authorizing Provider  aspirin 81 MG tablet Take 81 mg by mouth daily.    Historical Provider, MD  cloNIDine  (CATAPRES) 0.1 MG tablet Take 1 tablet (0.1 mg total) by mouth 2 (two) times daily. 05/10/13   Minna Merritts, MD  docusate sodium (COLACE) 100 MG capsule Take 100 mg by mouth 2 (two) times daily.    Historical Provider, MD  enalapril (VASOTEC) 5 MG tablet Take 5 mg by mouth daily.  07/16/13   Historical Provider, MD  Ferrous Sulfate (IRON) 325 (65 FE) MG TABS Take 1 tablet by mouth daily.    Historical Provider, MD  glipiZIDE (GLUCOTROL XL) 10 MG 24 hr tablet Take 10 mg by mouth 2 (two) times daily.    Historical Provider, MD  hydrALAZINE (APRESOLINE) 100 MG tablet TAKE ONE TABLET BY MOUTH THREE TIMES DAILY 07/19/13   Minna Merritts, MD  Insulin Lispro Prot & Lispro (HUMALOG MIX 75/25 [place]) Inject 70 Units into the skin 2 (two) times daily.    Historical Provider, MD  metoprolol (LOPRESSOR) 50 MG tablet Take 1 tablet (50 mg total) by mouth 2 (two) times daily. 02/08/14   Minna Merritts, MD  omeprazole (PRILOSEC) 40 MG capsule Take 40 mg by mouth daily.    Historical Provider, MD  pravastatin (PRAVACHOL) 40 MG tablet Take two tablets daily.    Historical Provider, MD  Rivaroxaban (XARELTO) 15 MG TABS tablet Take 1 tablet (15 mg total) by mouth daily. 05/05/12   Minna Merritts, MD  torsemide (DEMADEX) 20 MG tablet TAKE ONE TABLET BY MOUTH TWICE DAILY AS NEEDED 03/21/14   Minna Merritts, MD   Allergies  Allergen Reactions  . No Known Allergies    REVIEW OF SYSTEMS:   Constitutional: Feels well. Cardiovascular: No chest pain.  Pulmonary: Denies dyspnea.   The remainder  of systems were reviewed and were found to be negative other than what is documented in the HPI.    VITAL SIGNS: Temp:  [97.5 F (36.4 C)-101.1 F (38.4 C)] 99.3 F (37.4 C) (10/02 1131) Pulse Rate:  [70-85] 70 (10/02 1127) Resp:  [16-20] 16 (10/02 0839) BP: (139-151)/(69-72) 151/71 mmHg (10/02 1127) SpO2:  [93 %-97 %] 96 % (10/02 1127) Weight:  [80.831 kg (178 lb 3.2 oz)] 80.831 kg (178 lb 3.2 oz) (10/02  [id number]) HEMODYNAMICS:   Intake/Output Summary (Last 24 hours) at 12/25/14 1407 Last data filed at 12/25/14 1302  Gross per 24 hour  Intake 1701.67 ml  Output   3050 ml  Net -1348.33 ml    Physical Examination:   VS: BP 151/71 mmHg  Pulse 70  Temp(Src) 99.3 F (37.4 C) (Oral)  Resp 16  Ht 5\' 7"  (1.702 m)  Wt 80.831 kg (178 lb 3.2 oz)  BMI 27.90 kg/m2  SpO2 96%  General Appearance: No distress  Neuro:without focal findings, mental status, speech normal,   HEENT: PERRLA, EOM intact, no ptosis, no other lesions noticed;  Pulmonary: normal breath sounds., diaphragmatic excursion normal. CardiovascularNormal S1,S2.  No m/r/g.    Abdomen: Benign, Soft, non-tender,  Renal:  No costovertebral tenderness  GU:  Not performed at this time. Endoc: No evident thyromegaly, no signs of acromegaly. Skin:   warm, no rashes, no ecchymosis  Extremities: normal, no cyanosis, clubbing, no edema,    LABS: Reviewed   LABORATORY PANEL:   CBC  Recent Labs Lab 12/24/14 0502  WBC 8.0  HGB 9.0*  HCT 26.0*  PLT 269    Chemistries   Recent Labs Lab 12/24/14 2215  NA 135  K 3.7  CL 103  CO2 25  GLUCOSE 84  BUN 46*  CREATININE 2.09*  CALCIUM 8.0*  AST 19  ALT 12*  ALKPHOS 63  BILITOT 0.5     Recent Labs Lab 12/24/14 0703 12/24/14 1107 12/24/14 1611 12/24/14 2009 12/25/14 0747 12/25/14 1124  GLUCAP 95 161* 132* 156* 82 94    Recent Labs Lab 12/23/14 1130  PHART 7.46*  PCO2ART 39  PO2ART 56*    Recent Labs Lab 12/24/14 2215  AST 19  ALT 12*  ALKPHOS 63  BILITOT 0.5  ALBUMIN 2.4*    Cardiac Enzymes  Recent Labs Lab 12/23/14 2101  TROPONINI 0.03    RADIOLOGY:       --Marda Stalker, MD.  Board Certified in Internal Medicine, Pulmonary Medicine, [place], and Sleep Medicine.  Pager [phone] [facility] Office Number: [phone]  Patricia Pesa, M.D.  Vilinda Boehringer, M.D.  Merton Border,  M.D   12/25/2014, 2:07 PM

## 2014-12-25 NOTE — Progress Notes (Signed)
A&O. Up with assist. IVF infusing. Foley draining well. PICC in place. No s/s distress. Afib on tele.

## 2014-12-25 NOTE — Progress Notes (Signed)
[facility] [place] NAME: Patrick [name]    MR#:  [id number]  DATE OF BIRTH:  1931/12/28  SUBJECTIVE:  CHIEF COMPLAINT:   Chief Complaint  Patient presents with  . Chest Pain  Admitted for chest pain. Transferred to ICU 9/30 due to hypotension and rapid response. Thought to be likely from Clonidine and morphine/ Now he is awake and back to baseline. No further chest pain. No recent change in meds. No concerns regarding breathing. Could you for thoracentesis on 12/26/2014 by Dr. Juanell Fairly.  REVIEW OF SYSTEMS:    Review of Systems  Constitutional: Positive for malaise/fatigue. Negative for fever and chills.  HENT: Negative for sore throat.   Eyes: Negative for blurred vision, double vision and pain.  Respiratory: Negative for cough, hemoptysis, shortness of breath and wheezing.   Cardiovascular: Negative for chest pain, palpitations, orthopnea and leg swelling.  Gastrointestinal: Negative for heartburn, nausea, vomiting, abdominal pain, diarrhea and constipation.  Genitourinary: Negative for dysuria and hematuria.  Musculoskeletal: Negative for back pain and joint pain.  Skin: Negative for rash.  Neurological: Negative for sensory change, speech change, focal weakness and headaches.  Endo/Heme/Allergies: Does not bruise/bleed easily.  Psychiatric/Behavioral: Negative for depression. The patient is not nervous/anxious.     DRUG ALLERGIES:   Allergies  Allergen Reactions  . No Known Allergies     VITALS:  Blood pressure 151/71, pulse 70, temperature 99.3 F (37.4 C), temperature source Oral, resp. rate 16, height 5\' 7"  (1.702 m), weight 80.831 kg (178 lb 3.2 oz), SpO2 96 %.  PHYSICAL EXAMINATION:   Physical Exam  GENERAL:  81 y.o.-year-old patient lying in the bed with no acute distress.  EYES: Pupils equal, round, reactive to light and accommodation. No scleral icterus. Extraocular muscles intact.  HEENT: Head  atraumatic, normocephalic. Oropharynx and nasopharynx clear.  NECK:  Supple, no jugular venous distention. No thyroid enlargement, no tenderness.  LUNGS: Normal WOB. Clear. CARDIOVASCULAR: S1, S2 normal. No murmurs, rubs, or gallops.  ABDOMEN: Soft, nontender, nondistended. Bowel sounds present. No organomegaly or mass.  EXTREMITIES: No cyanosis, clubbing or edema b/l.    NEUROLOGIC: Moves all extremities  PSYCHIATRIC: The patient is awake and sitting up SKIN: No obvious rash   LABORATORY PANEL:   CBC  Recent Labs Lab 12/24/14 0502  WBC 8.0  HGB 9.0*  HCT 26.0*  PLT 269   ------------------------------------------------------------------------------------------------------------------  Chemistries   Recent Labs Lab 12/24/14 2215  NA 135  K 3.7  CL 103  CO2 25  GLUCOSE 84  BUN 46*  CREATININE 2.09*  CALCIUM 8.0*  AST 19  ALT 12*  ALKPHOS 63  BILITOT 0.5   ------------------------------------------------------------------------------------------------------------------  Cardiac Enzymes  Recent Labs Lab 12/23/14 2101  TROPONINI 0.03   ------------------------------------------------------------------------------------------------------------------  RADIOLOGY:  Dg Chest Port 1 View  12/23/2014   CLINICAL DATA:  Status post PICC line placement, history of atrial fibrillation, respiratory failure, diabetes, chronic renal insufficiency  EXAM: PORTABLE CHEST 1 VIEW  COMPARISON:  Chest x-ray of December 23, 2014  FINDINGS: The left-sided PICC line has its tip projecting over the midportion of the SVC. There is no postprocedure pneumothorax. There is a small left pleural effusion which is stable. The pulmonary interstitial markings remain mildly increased bilaterally. The cardiac silhouette is enlarged. The pulmonary vascularity is mildly engorged.  IMPRESSION: There is no postprocedure complication following left-sided PICC line placement.   Electronically Signed   By:  David  Martinique M.D.   On: 12/23/2014  17:01   Dg Chest Port 1 View  12/23/2014   CLINICAL DATA:  Shortness of breath.  EXAM: PORTABLE CHEST - 1 VIEW  COMPARISON:  CTA chest 12/22/2014.  FINDINGS: The heart is enlarged. Atherosclerotic calcifications are again noted. B left pleural effusion and associated airspace disease are noted. The right lung remains clear apart from minimal basilar atelectasis. The lung volumes are low. The visualized soft tissues and bony thorax are unremarkable.  IMPRESSION: 1. Persistent left pleural effusion and associated airspace disease. While this may represent atelectasis, early infection is not excluded. 2. Minimal atelectasis at the right lung base. 3. Atherosclerosis. 4. Low lung volumes.   Electronically Signed   By: San Morelle M.D.   On: 12/23/2014 12:21     ASSESSMENT AND PLAN:  40 m with CAD, systolic CHF, CKD 3 here with CP  * Fever Etiology is unclear. Chest x-ray does have atelectasis which could be the possible cause. We will also check urinalysis. No antibiotics at this time as he has normal white count.  * Unstable angina with new wall motion abnormalities on Echo. Lower EF than prior Echo. ASA, BB, Statin. Cardiology on board. Troponin normal. Will need cath for evaluation but has CKD3.  * ARF over CKD3 likely from ATN due to drop in BP yesterday. Hold ACEi. Repeat in AM. Should improve as his BP is better.  * Bilateral pleural effusions on CT chest. Chronic versus acute. Could be due to congestive heart failure. Pulmonary consulted. Pleural tap tomorrow  * Permanent atrial flutter ablation, on Eliquis. Heart rate in reasonable control, no acute problems. Continue home medications including Eliquis.  * Hypotension and syncope - resolved Clonidine held.  * Diabetes mellitus type 2 stable on home medications. Continue same. Follow up blood sugars closely.  * Hypertension, stable on home medications. Continue same.   *  Chronic systolic chf Monitor for fluid overload.   All the records are reviewed and case discussed with Care Management/Social Workerr. Management plans discussed with the patient, family and they are in agreement.  CODE STATUS: Limited code. No Intubation/Vent support.  DVT Prophylaxis: SCDs  TOTAL TIME TAKING CARE OF THIS PATIENT: 35 minutes.    Hillary Bow R M.D on 12/25/2014 at 11:48 AM  Between 7am to 6pm - Pager - [phone]  After 6pm go to [email or web address] - password [email or web address] [facility]  Office  [phone]  CC: Primary care physician; [place] Pink, MD    Note: This dictation was prepared with Dragon dictation along with smaller phrase technology. Any transcriptional errors that result from this process are unintentional.

## 2014-12-25 NOTE — Progress Notes (Signed)
Patient: Patrick [name] / Admit Date: 12/22/2014 / Date of Encounter: 12/25/2014, 1:34 PM   Subjective: Feels well this AM,  no  SOB at rest No further chest pain symptoms Blood pressure stable   Review of Systems: Review of Systems  Constitutional: Negative.   Respiratory: Positive for cough.   Cardiovascular: Negative.   Gastrointestinal: Negative.   Musculoskeletal: Negative.   Neurological: Negative.   Psychiatric/Behavioral: Negative.   All other systems reviewed and are negative.   Objective: Telemetry: atrial fib Physical Exam: Blood pressure 151/71, pulse 70, temperature 99.3 F (37.4 C), temperature source Oral, resp. rate 16, height 5\' 7"  (1.702 m), weight 178 lb 3.2 oz (80.831 kg), SpO2 96 %. Body mass index is 27.9 kg/(m^2). General: Well developed, well nourished, in no acute distress. Head: Normocephalic, atraumatic, sclera non-icteric, no xanthomas, nares are without discharge. Neck: Negative for carotid bruits. JVP not elevated. Lungs: Dull at the left base 1/3 [place] [address] up Breathing is unlabored. Heart: irregular RR without murmurs, rubs, or gallops.  Abdomen: Soft, non-tender, non-distended with normoactive bowel sounds. No rebound/guarding. Extremities: No clubbing or cyanosis. No edema. Distal pedal pulses are 2+ and equal bilaterally. Neuro: Alert and oriented X 3. Moves all extremities spontaneously. Psych:  Responds to questions appropriately with a normal affect.   Intake/Output Summary (Last 24 hours) at 12/25/14 1334 Last data filed at 12/25/14 1302  Gross per 24 hour  Intake 1701.67 ml  Output   3050 ml  Net -1348.33 ml    Inpatient Medications:  . aspirin EC  81 mg Oral Daily  . docusate sodium  100 mg Oral BID  . feeding supplement (NEPRO CARB STEADY)  237 mL Oral TID BM  . ferrous sulfate  325 mg Oral Daily  . glipiZIDE  10 mg Oral BID  . insulin aspart  0-15 Units Subcutaneous TID WC  . insulin aspart protamine- aspart  50  Units Subcutaneous BID WC  . metoprolol  50 mg Oral BID  . pantoprazole  40 mg Oral QAC breakfast  . pravastatin  40 mg Oral Daily  . sodium chloride  3 mL Intravenous Q12H  . torsemide  20 mg Oral Daily   Infusions:     Labs:  Recent Labs  12/24/14 0502 12/24/14 2215  NA 136 135  K 4.0 3.7  CL 105 103  CO2 27 25  GLUCOSE 99 84  BUN 40* 46*  CREATININE 2.40* 2.09*  CALCIUM 8.0* 8.0*    Recent Labs  12/24/14 2215  AST 19  ALT 12*  ALKPHOS 63  BILITOT 0.5  PROT 6.2*  ALBUMIN 2.4*    Recent Labs  12/22/14 2230 12/24/14 0502  WBC 8.0 8.0  NEUTROABS 6.8* 6.6*  HGB 11.3* 9.0*  HCT 33.9* 26.0*  MCV 83.1 82.9  PLT 354 269    Recent Labs  12/23/14 0719 12/23/14 1307 12/23/14 1722 12/23/14 2101  TROPONINI 0.03 <0.03 0.03 0.03   Invalid input(s): POCBNP No results for input(s): HGBA1C in the last 72 hours.   Weights: Filed Weights   12/24/14 0500 12/24/14 1206 12/25/14 0614  Weight: 176 lb 9.4 oz (80.1 kg) 178 lb 9.6 oz (81.012 kg) 178 lb 3.2 oz (80.831 kg)     Radiology/Studies:  Dg Chest Port 1 View  12/23/2014   CLINICAL DATA:  Status post PICC line placement, history of atrial fibrillation, respiratory failure, diabetes, chronic renal insufficiency  EXAM: PORTABLE CHEST 1 VIEW  COMPARISON:  Chest x-ray of December 23, 2014  FINDINGS: The left-sided PICC line has its tip projecting over the midportion of the SVC. There is no postprocedure pneumothorax. There is a small left pleural effusion which is stable. The pulmonary interstitial markings remain mildly increased bilaterally. The cardiac silhouette is enlarged. The pulmonary vascularity is mildly engorged.  IMPRESSION: There is no postprocedure complication following left-sided PICC line placement.   Electronically Signed   By: David  Martinique M.D.   On: 12/23/2014 17:01   Dg Chest Port 1 View  12/23/2014   CLINICAL DATA:    IMPRESSION: 1. Persistent left pleural effusion and associated airspace  disease. While this may represent atelectasis, early infection is not excluded. 2. Minimal atelectasis at the right lung base. 3. Atherosclerosis. 4. Low lung volumes.   Electronically Signed   By: San Morelle M.D.   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 No convincing evidence of acute vascular process.  Moderate LEFT, small RIGHT pleural effusions. Enhancing atelectasis. 14 mm lingular nodular density could represent parenchymal nodule or, round atelectasis.  Mediastinal lymphadenopathy, though this may be reactive, recommend follow-up.  17 mm LEFT thyroid nodule for which follow up thyroid sonogram is recommended on a nonemergent basis.   Electronically Signed   By: Elon Alas M.D.   On: 12/22/2014 23:44     Assessment and Plan  81 y.o. male with history of permanent afib dating back to 12/2011 and has been on renal-dosed xarelto since, chronic diastolic CHF, CKD stage III-IV, anemia of chronic disease, HTN, HLD, DM2, GERD, and Bells' Palsy who presented [facility] on [facility] with complaints of chest pain.  1) pleural effusion Chronic, likely secondary to anemia, as well as chronic systolic and diastolic CHF. X-ray July 2016 with large effusion, most recent x-ray and CT with moderate effusion (improved) -Again Discussed risk and benefit of thoracentesis with family We will hold the anticoagulation tonight Possible thoracentesis tomorrow or Tuesday   would restart anticoagulation after procedure   would continue diuresis for now  2) atrial fibrillation Chronic, on anticoagulation Rate adequately controlled.  Likely contributing to pleural effusion  3)  chest pain , atypical   CT scan with some degree of coronary artery disease,  cardiac enzymes essentially normal Echocardiogram with wall motion abnormality, mildly depressed ejection fraction Could consider outpatient stress  test, pharmacological  4) anemia Acute on chronic, worse on blood draw today  Receives Epogen infusion on a regular basis Baseline hematocrit of 9 Likely contributing to pleural effusion  5) chronic kidney disease, stage IV Baseline creatinine of 2 Hold torsemide for additional climb in BUN and creatinine   Signed, Esmond Plants. MD [facility] 12/25/2014, 1:34 PM

## 2014-12-26 ENCOUNTER — Inpatient Hospital Stay: Payer: Commercial Managed Care - HMO

## 2014-12-26 ENCOUNTER — Telehealth: Payer: Self-pay

## 2014-12-26 LAB — BODY FLUID CELL COUNT WITH DIFFERENTIAL
EOS FL: 0 %
Lymphs, Fluid: 66 %
Monocyte-Macrophage-Serous Fluid: 21 %
Neutrophil Count, Fluid: 13 %
OTHER CELLS FL: 0 %
WBC FLUID: 489 uL

## 2014-12-26 LAB — LACTATE DEHYDROGENASE, PLEURAL OR PERITONEAL FLUID: LD FL: 123 U/L — AB (ref 3–23)

## 2014-12-26 LAB — URINE CULTURE

## 2014-12-26 LAB — BASIC METABOLIC PANEL
Anion gap: 6 (ref 5–15)
BUN: 42 mg/dL — AB (ref 6–20)
CALCIUM: 8.3 mg/dL — AB (ref 8.9–10.3)
CO2: 27 mmol/L (ref 22–32)
CREATININE: 2.12 mg/dL — AB (ref 0.61–1.24)
Chloride: 105 mmol/L (ref 101–111)
GFR, EST AFRICAN AMERICAN: 32 mL/min — AB (ref >60)
GFR, EST NON AFRICAN AMERICAN: 28 mL/min — AB (ref >60)
Glucose, Bld: 84 mg/dL (ref 65–99)
Potassium: 3.5 mmol/L (ref 3.5–5.1)
SODIUM: 138 mmol/L (ref 135–145)

## 2014-12-26 LAB — GLUCOSE, CAPILLARY
GLUCOSE-CAPILLARY: 84 mg/dL (ref 65–99)
Glucose-Capillary: 78 mg/dL (ref 65–99)

## 2014-12-26 LAB — PROTEIN, BODY FLUID: Total protein, fluid: <3 g/dL

## 2014-12-26 MED ORDER — CEFUROXIME AXETIL 250 MG PO TABS: 250.0000 mg | ORAL_TABLET | Freq: Two times a day (BID) | ORAL | Status: DC

## 2014-12-26 MED ORDER — CEFUROXIME AXETIL 250 MG PO TABS
250.0000 mg | ORAL_TABLET | Freq: Two times a day (BID) | ORAL | Status: DC
  Administered 2014-12-26: 250 mg via ORAL
  Filled 2014-12-26: qty 1

## 2014-12-26 NOTE — Progress Notes (Signed)
PT Cancellation Note  Patient Details Name: Patrick [name] MRN: [id number] DOB: 30-Dec-1931   Cancelled Treatment:    Reason Eval/Treat Not Completed: Patient at procedure or test/unavailable (Nursing reports pt off floor for thoracentesis.)  Will re-attempt PT eval later today as able.   Raquel Sarna Katlyn Muldrew 12/26/2014, 10:37 AM Leitha Bleak, [place]

## 2014-12-26 NOTE — Evaluation (Signed)
Physical Therapy Evaluation Patient Details Name: Patrick [name] MRN: [id number] DOB: Nov 22, 1931 Today's Date: 12/26/2014   History of Present Illness  Pt is an 81 y.o. male presenting to hospital with chest pain.  Pt found to have unstable angina with new wall motion abnormalities on Echo and B pleural effusion.  Pt transferred to CCU with acute respiratory failure secondary to pulmonary edema from CHF and volume overload 12/23/14 and is now transferred back out of CCU.  Pt s/p thoracentesis 12/26/14 (1.3 L obtained).  Clinical Impression  Currently pt demonstrates impairments with strength, activity tolerance, balance, and limitations with functional mobility.  Prior to admission, pt was independent without an AD (although does use a RW if not feeling strong).  Pt lives with his wife and "strong" son in 1 level home with 4 steps to enter with L railing.  Currently pt is min to mod assist to stand with RW but otherwise is CGA with ambulation 110 feet with RW (no loss of balance with ambulation noted).  Pt would benefit from skilled PT to address above noted impairments and functional limitations.  Pt could benefit from short STR stay but pt is refusing STR.  D/t this, recommend pt discharge to home with 24/7 assist for all functional mobility (using RW for transfers and ambulation) and HHPT when medically appropriate.  Pt always has assist for stairs and pt's wife and "strong" son plan on assisting pt on stairs into home; pt's wife also demonstrated appropriate safety assisting pt with transfers and ambulation.     Follow Up Recommendations Supervision/Assistance - 24 hour;Home health PT    Equipment Recommendations       Recommendations for Other Services       Precautions / Restrictions Precautions Precautions: Fall Restrictions Weight Bearing Restrictions: No      Mobility  Bed Mobility Overal bed mobility: Needs Assistance Bed Mobility: Supine to Sit;Sit to Supine      Supine to sit: Supervision;HOB elevated Sit to supine: Supervision;HOB elevated      Transfers Overall transfer level: Needs assistance Equipment used: Rolling walker (2 wheeled) Transfers: Sit to/from Stand Sit to Stand:  (mod assist first trial (with PT assist); min assist 2nd trial (with wife's assist))            Ambulation/Gait Ambulation/Gait assistance: Min guard Ambulation Distance (Feet): 110 Feet Assistive device: Rolling walker (2 wheeled)   Gait velocity: decreased   General Gait Details: decreased B step length/foot clearance/heelstrike; no loss of balance noted  Stairs            Wheelchair Mobility    Modified Rankin (Stroke Patients Only)       Balance Overall balance assessment: Needs assistance Sitting-balance support: Bilateral upper extremity supported;Feet supported Sitting balance-Leahy Scale: Good     Standing balance support: Bilateral upper extremity supported (on RW) Standing balance-Leahy Scale: Good                               Pertinent Vitals/Pain Pain Assessment: No/denies pain  Vitals stable and WFL throughout treatment session.    Home Living Family/patient expects to be discharged to:: Private residence Living Arrangements: Spouse/significant other;Children (Wife and 37 y.o. son) Available Help at Discharge: Family Type of Home: House Home Access: Stairs to enter Entrance Stairs-Rails: Left Entrance Stairs-Number of Steps: 4 Home Layout: One level Home Equipment: Walker - 2 wheels      Prior Function Level of Independence:  Independent with assistive device(s)         Comments: Pt does not usually use any AD but will use RW when not feeling strong.  Pt reports 1 fall in the home (tripped) in last 6 months.     Hand Dominance        Extremity/Trunk Assessment   Upper Extremity Assessment: Generalized weakness           Lower Extremity Assessment: Generalized weakness          Communication      Cognition Arousal/Alertness: Awake/alert Behavior During Therapy: WFL for tasks assessed/performed Overall Cognitive Status: Within Functional Limits for tasks assessed                      General Comments General comments (skin integrity, edema, etc.): dressing in place from thoracentesis  Nursing cleared pt for participation in physical therapy.  Pt agreeable to PT session.  Pt's wife present during session.  Discussed extensively with pt and pt's wife regarding recommended assist levels at home for safe discharge and also using urinal at bedside at night so pt did not have to get up in the middle of the night (pt's wife verbalizing good understanding).    Exercises        Assessment/Plan    PT Assessment Patient needs continued PT services  PT Diagnosis Generalized weakness   PT Problem List Decreased strength;Decreased activity tolerance;Decreased balance;Decreased mobility  PT Treatment Interventions DME instruction;Gait training;Stair training;Functional mobility training;Therapeutic activities;Therapeutic exercise;Balance training;Patient/family education   PT Goals (Current goals can be found in the Care Plan section) Acute Rehab PT Goals Patient Stated Goal: To go home PT Goal Formulation: With patient/family Time For Goal Achievement: 01/09/15 Potential to Achieve Goals: Good    Frequency Min 2X/week   Barriers to discharge        Co-evaluation               End of Session Equipment Utilized During Treatment: Gait belt Activity Tolerance: Patient tolerated treatment well Patient left: in bed;with call bell/phone within reach;with bed alarm set;with family/visitor present Nurse Communication: Mobility status         Time: 1335-1410 PT Time Calculation (min) (ACUTE ONLY): 35 min   Charges:   PT Evaluation $Initial PT Evaluation Tier I: 1 Procedure PT Treatments $Therapeutic Activity: 8-22 mins   PT G CodesLeitha Bleak 12/26/2014, 4:09 PM Leitha Bleak, [place]

## 2014-12-26 NOTE — Progress Notes (Signed)
Spoke with Raquel Sarna, PT and asked that she come see patient, now that he is back from his procedure in order to evaluate what services he will need upon discharge. Raquel Sarna, PT stated she will be able to see him between 1300 - 1400 this afternoon.

## 2014-12-26 NOTE — Discharge Summary (Signed)
[place] [facility] [place] NAME: Patrick [name]    MR#:  [id number]  DATE OF BIRTH:  November 05, 1931  DATE OF ADMISSION:  12/22/2014 ADMITTING PHYSICIAN: Juluis Mire, MD  DATE OF DISCHARGE: 12/26/2014 PRIMARY CARE PHYSICIAN: Maryland Pink, MD    ADMISSION DIAGNOSIS:  Pleural effusion [J90] Chest pain, unspecified chest pain type [R07.9]  DISCHARGE DIAGNOSIS:  Principal Problem:   Chest pain Active Problems:   A-fib (HCC)   Hypertension   Diabetes mellitus (HCC)   Chronic renal disease   Bilateral pleural effusion   CKD (chronic kidney disease)   Pleural effusion   Acute on chronic respiratory failure with hypoxia (HCC)   Pleural effusion on left   Acute posthemorrhagic anemia   SECONDARY DIAGNOSIS:   Past Medical History  Diagnosis Date  . Hypertension   . Diabetes mellitus without complication   . Hyperlipidemia   . History of gastroesophageal reflux (GERD)   . CKD (chronic kidney disease), stage III     a. stage III-IV  . Permanent atrial fibrillation     a. Dx 12/2011, Rate-controlled, chronic Xarelto (renal dosing).  . Bell's palsy   . Chronic diastolic CHF (congestive heart failure)     a. 11/2012 Echo: EF 60-65%, mod conc LVH, mildly dil LA/RA, mild Ao sclerosis w/o stenosis; b. stage III-IV    . Anemia of chronic disease     a. plan of oncology to start Procrit - received this during admission 12/2013  . Chronic atrial fibrillation   . Hyperlipidemia   . GERD (gastroesophageal reflux disease)   . Bell's palsy   . Amputated finger     HOSPITAL COURSE:  81 year old male with apparent atrial fibrillation, diastolic heart failure who presented with chest pain. Further details please refer to H&P.  1. Chest pain: This is atypical in nature. Patient was seen by cardiology. 2-D echocardiogram showed wall motion abnormality, mildly depressed ejection fraction plan is for outpatient stress test.  2. Pleural  effusion, left-sided: This is chronic likely secondary to anemia as well as chronic systolic and diastolic heart failure. Patient underwent thoracentesis on the day of discharge.  3. Atrial fibrillation: This is chronic in nature. Patient is on anticoagulation which was temporarily held for thoracentesis but will be restarted after his procedure. Heart rate was controlled.  4. Urinary tract infection: Patient had a low-grade fever urine analysis was checked which did show urinary tract infection. He was started on Ceftin.  5. Acute renal failure over chronic kidney disease stage III: Creatinine has improved. This was thought to be secondary to ATN due to hypotension.  6. Type 2 diabetes: Patient continues outpatient medications.  7. Essential hypertension: Continue outpatient medications  8. Chronic systolic/diastolic heart failure: Patient is euvolemic and will follow up with cardiology as outpatient  9. Anemia of chronic disease: Patient received Neupogen infusion on a regular basis. His baseline hemoglobin of 9. Continue iron supplements.  DISCHARGE CONDITIONS AND DIET:   patient is to be discharged home in stable condition on a heart healthy/diabetic diet  CONSULTS OBTAINED:  Treatment Team:  Wellington Hampshire, MD Rise Mu, PA-C Vilinda Boehringer, MD Laverle Hobby, MD  DRUG ALLERGIES:   Allergies  Allergen Reactions  . No Known Allergies     DISCHARGE MEDICATIONS:   Current Discharge Medication List    START taking these medications   Details  cefUROXime (CEFTIN) 250 MG tablet Take 1 tablet (250 mg total) by mouth 2 (  two) times daily with a meal. Qty: 10 tablet, Refills: 0      CONTINUE these medications which have NOT CHANGED   Details  aspirin 81 MG tablet Take 81 mg by mouth daily.    cloNIDine (CATAPRES) 0.1 MG tablet Take 1 tablet (0.1 mg total) by mouth 2 (two) times daily. Qty: 60 tablet, Refills: 11    docusate sodium (COLACE) 100 MG capsule Take 100  mg by mouth 2 (two) times daily.    enalapril (VASOTEC) 5 MG tablet Take 5 mg by mouth daily.     Ferrous Sulfate (IRON) 325 (65 FE) MG TABS Take 1 tablet by mouth daily.    glipiZIDE (GLUCOTROL XL) 10 MG 24 hr tablet Take 10 mg by mouth 2 (two) times daily.    hydrALAZINE (APRESOLINE) 100 MG tablet TAKE ONE TABLET BY MOUTH THREE TIMES DAILY Qty: 270 tablet, Refills: 0    Insulin Lispro Prot & Lispro (HUMALOG MIX 75/25 [place]) Inject 70 Units into the skin 2 (two) times daily.    metoprolol (LOPRESSOR) 50 MG tablet Take 1 tablet (50 mg total) by mouth 2 (two) times daily. Qty: 180 tablet, Refills: 3    omeprazole (PRILOSEC) 40 MG capsule Take 40 mg by mouth daily.    pravastatin (PRAVACHOL) 40 MG tablet Take two tablets daily.    Rivaroxaban (XARELTO) 15 MG TABS tablet Take 1 tablet (15 mg total) by mouth daily. Qty: 30 tablet, Refills: 3    torsemide (DEMADEX) 20 MG tablet TAKE ONE TABLET BY MOUTH TWICE DAILY AS NEEDED Qty: 180 tablet, Refills: 3              Today   CHIEF COMPLAINT:   patient is doing well this morning. Patient denies chest pain or shortness of breath.   VITAL SIGNS:  Blood pressure 123/66, pulse 75, temperature 97.8 F (36.6 C), temperature source Oral, resp. rate 16, height 5\' 7"  (1.702 m), weight 78.518 kg (173 lb 1.6 oz), SpO2 98 %.   REVIEW OF SYSTEMS:  Review of Systems  Constitutional: Negative for fever, chills and malaise/fatigue.  HENT: Negative for sore throat.   Eyes: Negative for blurred vision.  Respiratory: Negative for cough, hemoptysis, shortness of breath and wheezing.   Cardiovascular: Negative for chest pain, palpitations and leg swelling.  Gastrointestinal: Negative for nausea, vomiting, abdominal pain, diarrhea and blood in stool.  Genitourinary: Negative for dysuria.  Musculoskeletal: Negative for back pain.  Neurological: Positive for weakness. Negative for dizziness, tremors and headaches.  Endo/Heme/Allergies: Does not  bruise/bleed easily.     PHYSICAL EXAMINATION:  GENERAL:  81 y.o.-year-old patient lying in the bed with no acute distress.  NECK:  Supple, no jugular venous distention. No thyroid enlargement, no tenderness.  LUNGS: decreased breath sounds left base no wheezing, rales,rhonchi  No use of accessory muscles of respiration.  CARDIOVASCULAR: S1, S2 normal. No murmurs, rubs, or gallops.  ABDOMEN: Soft, non-tender, non-distended. Bowel sounds present. No organomegaly or mass.  EXTREMITIES: No pedal edema, cyanosis, or clubbing.  PSYCHIATRIC: The patient is alert and oriented x 3.  SKIN: No obvious rash, lesion, or ulcer.   DATA REVIEW:   CBC  Recent Labs Lab 12/24/14 0502  WBC 8.0  HGB 9.0*  HCT 26.0*  PLT 269    Chemistries   Recent Labs Lab 12/24/14 2215 12/26/14 0402  NA 135 138  K 3.7 3.5  CL 103 105  CO2 25 27  GLUCOSE 84 84  BUN 46* 42*  CREATININE 2.09*  2.12*  CALCIUM 8.0* 8.3*  AST 19  --   ALT 12*  --   ALKPHOS 63  --   BILITOT 0.5  --     Cardiac Enzymes  Recent Labs Lab 12/23/14 1307 12/23/14 1722 12/23/14 2101  TROPONINI <0.03 0.03 0.03    Microbiology Results  @[email or web address] @  RADIOLOGY:  Dg Chest 1 View  12/26/2014   CLINICAL DATA:  Status post left thoracentesis  EXAM: CHEST  1 VIEW  COMPARISON:  Film from earlier in the same day  FINDINGS: Cardiac shadow is mildly enlarged. A left-sided PICC line is noted in the in the distal superior vena cava. The lungs are well aerated. Interval thoracentesis is noted with resolution of left-sided pleural effusion. No pneumothorax is noted. No acute bony abnormality is seen.  IMPRESSION: No evidence of pneumothorax following thoracentesis. There is resolution of the left-sided pleural effusion.   Electronically Signed   By: Inez Catalina M.D.   On: 12/26/2014 10:57   Dg Chest 1 View  12/26/2014   CLINICAL DATA:  Acute respiratory failure and known pleural effusion  EXAM: CHEST  1 VIEW  COMPARISON:  12/23/2014   FINDINGS: Cardiac shadow is at the upper limits of normal in size but stable. Left-sided PICC line is noted in satisfactory position. Persistent small left pleural effusion is noted laterally. No new focal infiltrate is seen. The right lung remains clear.  IMPRESSION: No change in left effusion.  Stable PICC line position.   Electronically Signed   By: Inez Catalina M.D.   On: 12/26/2014 08:49   US Thoracentesis Asp Pleural Space W/img Guide  12/26/2014   CLINICAL DATA:  Left pleural effusion  EXAM: ULTRASOUND GUIDED left THORACENTESIS  PROCEDURE: An ultrasound guided thoracentesis was thoroughly discussed with the patient and questions answered. The benefits, risks, alternatives and complications were also discussed. The patient understands and wishes to proceed with the procedure. Written consent was obtained.  Ultrasound was performed to localize and mark an adequate pocket of fluid in the left chest. The area was then prepped and draped in the normal sterile fashion. 1% Lidocaine was used for local anesthesia. Under ultrasound guidance a 6 French thoracentesis catheter was introduced. Thoracentesis was performed. The catheter was removed and a dressing applied.  COMPLICATIONS: None  FINDINGS: A total of approximately 1.3 L of clear yellow fluid was removed. A fluid sample was sent for laboratory analysis.  IMPRESSION: Successful ultrasound guided right/left thoracentesis yielding 1.3 L of pleural fluid.   Electronically Signed   By: Inez Catalina M.D.   On: 12/26/2014 10:56      Management plans discussed with the patient and he is in agreement. Stable for discharge home with home health care  Patient should follow up with cardiology in 1 week and PCP in one week  CODE STATUS:     Code Status Orders        Start     Ordered   12/23/14 1145  Limited resuscitation (code)   Continuous    Question Answer Comment  In the event of cardiac or respiratory ARREST: Initiate Code Blue, Call Rapid  Response Yes   In the event of cardiac or respiratory ARREST: Perform CPR Yes   In the event of cardiac or respiratory ARREST: Perform Intubation/Mechanical Ventilation No   In the event of cardiac or respiratory ARREST: Use NIPPV/BiPAp only if indicated Yes   In the event of cardiac or respiratory ARREST: Administer ACLS medications if indicated Yes  In the event of cardiac or respiratory ARREST: Perform Defibrillation or Cardioversion if indicated Yes      12/23/14 1144      TOTAL TIME TAKING CARE OF THIS PATIENT: 35 minutes.    Roiza Wiedel M.D on 12/26/2014 at 11:16 AM  Between 7am to 6pm - Pager - [phone] After 6pm go to [email or web address] - password [email or web address] [facility]  [place] [facility]  Office  [phone]  CC: Primary care physician; Maryland Pink, MD

## 2014-12-26 NOTE — Progress Notes (Signed)
Pt is to be discharged home today. Patient is in no acute distress, IV and PICC are out, all paperwork has been reviewed/discussed with patient, and there are no questions/concerns at this time. Case Management has set up home health, and I have verified with the [facility] that they have received his prescriptions and are in the process of filling them. Assessment is unchanged from this morning. Patient is to be accompanied downstairs by staff and family via wheelchair.

## 2014-12-26 NOTE — Telephone Encounter (Signed)
Patient contacted regarding discharge from [facility] on 12/26/14.  Patient understands to follow up with provider Dr. [name] on 10/7 at 3:00pm at [facility] [place] .  Patient understands to bring all medications to this visit: yes  S/w pt who is currently in the hospital awaiting discharge today. Pt states his wife "gets her hair fixed on Fridays at 3pm". Suggested to pt to ask wife if she could change her appt. States he will ask and call back if this date and time is not suitable. Advised pt to call us if any questions regarding discharge instructions.

## 2014-12-26 NOTE — Care Management Note (Signed)
Case Management Note  Patient Details  Name: Patrick [name] MRN: [id number] Date of Birth: [birth year]  Subjective/Objective:                 Patient to be discharged home today post PT assessment.  Patient is adamant that if SNF is recommened that he will refuse.  Home health order placed and medicare face to face completed by MD.  Patient provided home health preference list.  [facility].  Corene Cornea from [facility] notified.  [facility] referral made and information sent to [phone]   Action/Plan: No further needs identified.  RNCM signing off.    Expected Discharge Date:                  Expected Discharge Plan:     In-House Referral:     Discharge planning Services     Post Acute Care Choice:    Choice offered to:     DME Arranged:    DME Agency:     HH Arranged:    [place] Agency:     Status of Service:     Medicare Important Message Given:  Yes-second notification given Date Medicare IM Given:    Medicare IM give by:    Date Additional Medicare IM Given:    Additional Medicare Important Message give by:     If discussed at [place] of Stay Meetings, dates discussed:    Additional Comments:  Beverly Sessions, RN 12/26/2014, 1:38 PM

## 2014-12-26 NOTE — Progress Notes (Addendum)
A&O. Resting in bed through the night. No complaints. ON RA. Afib on tele. PICC line in place. Voiding well. For thoracentesis this AM. Consent signed in chart.

## 2014-12-26 NOTE — Procedures (Signed)
Left thoracentesis with [place]  Complications:  None  Blood Loss: none  See dictation in canopy pacs

## 2014-12-27 DIAGNOSIS — K219 Gastro-esophageal reflux disease without esophagitis: Secondary | ICD-10-CM | POA: Diagnosis not present

## 2014-12-27 DIAGNOSIS — I5043 Acute on chronic combined systolic (congestive) and diastolic (congestive) heart failure: Secondary | ICD-10-CM | POA: Diagnosis not present

## 2014-12-27 DIAGNOSIS — Z87891 Personal history of nicotine dependence: Secondary | ICD-10-CM | POA: Diagnosis not present

## 2014-12-27 DIAGNOSIS — E785 Hyperlipidemia, unspecified: Secondary | ICD-10-CM | POA: Diagnosis not present

## 2014-12-27 DIAGNOSIS — D638 Anemia in other chronic diseases classified elsewhere: Secondary | ICD-10-CM | POA: Diagnosis not present

## 2014-12-27 DIAGNOSIS — Z794 Long term (current) use of insulin: Secondary | ICD-10-CM | POA: Diagnosis not present

## 2014-12-27 DIAGNOSIS — I4891 Unspecified atrial fibrillation: Secondary | ICD-10-CM | POA: Diagnosis not present

## 2014-12-27 DIAGNOSIS — N183 Chronic kidney disease, stage 3 (moderate): Secondary | ICD-10-CM | POA: Diagnosis not present

## 2014-12-27 DIAGNOSIS — E119 Type 2 diabetes mellitus without complications: Secondary | ICD-10-CM | POA: Diagnosis not present

## 2014-12-27 LAB — CYTOLOGY - NON PAP

## 2014-12-27 NOTE — Patient Outreach (Signed)
[place] [facility]) Care Management  12/27/2014  MALEEK BOUGHTON 1931-09-16 [id number]   Referral from [facility], assigned to Mariann Laster, RN and Harlow Asa, PharmD for patient outreach.  Quaneshia Wareing L. Kamilah Correia, [place] Care Management Assistant

## 2014-12-28 ENCOUNTER — Other Ambulatory Visit: Payer: Self-pay | Admitting: Pharmacist

## 2014-12-28 ENCOUNTER — Other Ambulatory Visit: Payer: Self-pay

## 2014-12-28 DIAGNOSIS — N183 Chronic kidney disease, stage 3 (moderate): Secondary | ICD-10-CM | POA: Diagnosis not present

## 2014-12-28 DIAGNOSIS — I4891 Unspecified atrial fibrillation: Secondary | ICD-10-CM | POA: Diagnosis not present

## 2014-12-28 DIAGNOSIS — Z794 Long term (current) use of insulin: Secondary | ICD-10-CM | POA: Diagnosis not present

## 2014-12-28 DIAGNOSIS — E785 Hyperlipidemia, unspecified: Secondary | ICD-10-CM | POA: Diagnosis not present

## 2014-12-28 DIAGNOSIS — K219 Gastro-esophageal reflux disease without esophagitis: Secondary | ICD-10-CM | POA: Diagnosis not present

## 2014-12-28 DIAGNOSIS — E119 Type 2 diabetes mellitus without complications: Secondary | ICD-10-CM | POA: Diagnosis not present

## 2014-12-28 DIAGNOSIS — Z87891 Personal history of nicotine dependence: Secondary | ICD-10-CM | POA: Diagnosis not present

## 2014-12-28 DIAGNOSIS — D638 Anemia in other chronic diseases classified elsewhere: Secondary | ICD-10-CM | POA: Diagnosis not present

## 2014-12-28 DIAGNOSIS — I5043 Acute on chronic combined systolic (congestive) and diastolic (congestive) heart failure: Secondary | ICD-10-CM | POA: Diagnosis not present

## 2014-12-28 NOTE — Patient Outreach (Signed)
Patrick [name] is a 81 y.o. male referred to pharmacy for medication management. Called to speak with patient. Reached his wife who reports that he is sleeping. Left my name and phone number and message that I would call back on 12/30/14. Will follow up with Patrick [name] at that time.  Harlow Asa, PharmD Clinical Pharmacist [place] Management [phone]

## 2014-12-28 NOTE — Patient Outreach (Addendum)
[place] [facility]) Care Management  12/28/2014  Patrick [name] 1931/11/20 [id number]   Telephonic Care Management Note  Referral Date:  12/27/14 Referral Source:  [facility]  Referral Issue:  Patient was discharged from [facility] on 12/26/14 for HF, DM and Renal failure.  Patient also needs med management.  (Referral saved under Media tab).   Admissions:  1 12/22/14 - 12/25/13 CHF; acute respiratory failure secondary to pulmonary edema from congestive heart failure, and volume overload ED:  1 Insurance:  [place]  [id number] PCP:  Dr. Maryland Pink [facility]:  [place] Referral sent 12/27/14.   Outreach call #1 to patient. Patient's wife reached and states patient is not available at this time due to working with [facility] PT services.  RN CM instructed that RN CM will reschedule call for a later time today.    Mariann Laster, RN, BSN, [facility], CCM  [employer] Management Coordinator [phone] Direct [phone] Cell [phone] Office [phone] Fax

## 2014-12-29 ENCOUNTER — Other Ambulatory Visit: Payer: Self-pay

## 2014-12-29 DIAGNOSIS — E785 Hyperlipidemia, unspecified: Secondary | ICD-10-CM | POA: Diagnosis not present

## 2014-12-29 DIAGNOSIS — Z87891 Personal history of nicotine dependence: Secondary | ICD-10-CM | POA: Diagnosis not present

## 2014-12-29 DIAGNOSIS — I5043 Acute on chronic combined systolic (congestive) and diastolic (congestive) heart failure: Secondary | ICD-10-CM | POA: Diagnosis not present

## 2014-12-29 DIAGNOSIS — E119 Type 2 diabetes mellitus without complications: Secondary | ICD-10-CM | POA: Diagnosis not present

## 2014-12-29 DIAGNOSIS — K219 Gastro-esophageal reflux disease without esophagitis: Secondary | ICD-10-CM | POA: Diagnosis not present

## 2014-12-29 DIAGNOSIS — N183 Chronic kidney disease, stage 3 (moderate): Secondary | ICD-10-CM | POA: Diagnosis not present

## 2014-12-29 DIAGNOSIS — Z794 Long term (current) use of insulin: Secondary | ICD-10-CM | POA: Diagnosis not present

## 2014-12-29 DIAGNOSIS — I4891 Unspecified atrial fibrillation: Secondary | ICD-10-CM | POA: Diagnosis not present

## 2014-12-29 DIAGNOSIS — D638 Anemia in other chronic diseases classified elsewhere: Secondary | ICD-10-CM | POA: Diagnosis not present

## 2014-12-29 NOTE — Patient Outreach (Signed)
[place] [facility]) Care Management  12/29/2014  Patrick [name] [birth year] [id number]   Telephonic Care Management Note  Referral Date: 12/27/14 Referral Source: [facility]  Referral Issue: Patient was discharged from [facility] on 12/26/14 for HF, DM and Renal failure. Patient also needs med management. (Referral saved under Media tab).   Admissions: (1)  12/22/14 - 12/25/13 CHF; acute respiratory failure secondary to pulmonary edema from congestive heart failure, and volume overload ED: (1) Insurance: [place] [id number] PCP: Dr. Maryland Pink [facility]: [place] Referral sent 12/27/14.   Outreach call #2 to patient. Patient not reached.   RN CM left HIPAA compliant voice message with name and call back #.  RN CM rescheduled for next outreach call within one week.   Mariann Laster, RN, BSN, [facility], CCM  [employer] Management Coordinator [phone] Direct [phone] Cell [phone] Office [phone] Fax

## 2014-12-30 ENCOUNTER — Other Ambulatory Visit: Payer: Self-pay | Admitting: Pharmacist

## 2014-12-30 ENCOUNTER — Encounter: Payer: Commercial Managed Care - HMO | Admitting: Cardiovascular Disease

## 2014-12-30 LAB — BODY FLUID CULTURE: Culture: NO GROWTH

## 2014-12-30 LAB — COMP PANEL: LEUKEMIA/LYMPHOMA

## 2014-12-30 LAB — PH, BODY FLUID: PH, BODY FLUID: 7.9

## 2014-12-30 NOTE — Patient Outreach (Signed)
Patrick [name] is a 81 y.o. male referred to pharmacy for medication management. Called to speak with patient. Call attempt #2. Left a HIPAA compliant message on the patient's voicemail. If have not heard from patient by next week, will give him another call at that time.   Harlow Asa, PharmD Clinical Pharmacist [place] Management [phone]

## 2015-01-03 ENCOUNTER — Telehealth: Payer: Self-pay | Admitting: *Deleted

## 2015-01-03 DIAGNOSIS — J90 Pleural effusion, not elsewhere classified: Secondary | ICD-10-CM

## 2015-01-03 DIAGNOSIS — D638 Anemia in other chronic diseases classified elsewhere: Secondary | ICD-10-CM | POA: Diagnosis not present

## 2015-01-03 DIAGNOSIS — I4891 Unspecified atrial fibrillation: Secondary | ICD-10-CM | POA: Diagnosis not present

## 2015-01-03 DIAGNOSIS — Z794 Long term (current) use of insulin: Secondary | ICD-10-CM | POA: Diagnosis not present

## 2015-01-03 DIAGNOSIS — E119 Type 2 diabetes mellitus without complications: Secondary | ICD-10-CM | POA: Diagnosis not present

## 2015-01-03 DIAGNOSIS — N183 Chronic kidney disease, stage 3 (moderate): Secondary | ICD-10-CM | POA: Diagnosis not present

## 2015-01-03 DIAGNOSIS — Z87891 Personal history of nicotine dependence: Secondary | ICD-10-CM | POA: Diagnosis not present

## 2015-01-03 DIAGNOSIS — E785 Hyperlipidemia, unspecified: Secondary | ICD-10-CM | POA: Diagnosis not present

## 2015-01-03 DIAGNOSIS — I5043 Acute on chronic combined systolic (congestive) and diastolic (congestive) heart failure: Secondary | ICD-10-CM | POA: Diagnosis not present

## 2015-01-03 DIAGNOSIS — K219 Gastro-esophageal reflux disease without esophagitis: Secondary | ICD-10-CM | POA: Diagnosis not present

## 2015-01-03 NOTE — Telephone Encounter (Signed)
appt scheduled with with and informed he needs CXR prior to visit. Nothing further needed.

## 2015-01-03 NOTE — Telephone Encounter (Signed)
-----   Message from Laverle Hobby, MD sent at 01/02/2015  2:11 PM EDT ----- Regarding: follow up Pt needs follow up with me in about 1 month with 2 view CXR for left pleural effusion.

## 2015-01-04 DIAGNOSIS — R079 Chest pain, unspecified: Secondary | ICD-10-CM | POA: Diagnosis not present

## 2015-01-04 DIAGNOSIS — J90 Pleural effusion, not elsewhere classified: Secondary | ICD-10-CM | POA: Diagnosis not present

## 2015-01-04 DIAGNOSIS — I10 Essential (primary) hypertension: Secondary | ICD-10-CM | POA: Diagnosis not present

## 2015-01-05 ENCOUNTER — Other Ambulatory Visit: Payer: Self-pay

## 2015-01-05 DIAGNOSIS — I4891 Unspecified atrial fibrillation: Secondary | ICD-10-CM | POA: Diagnosis not present

## 2015-01-05 DIAGNOSIS — E785 Hyperlipidemia, unspecified: Secondary | ICD-10-CM | POA: Diagnosis not present

## 2015-01-05 DIAGNOSIS — D638 Anemia in other chronic diseases classified elsewhere: Secondary | ICD-10-CM | POA: Diagnosis not present

## 2015-01-05 DIAGNOSIS — I5043 Acute on chronic combined systolic (congestive) and diastolic (congestive) heart failure: Secondary | ICD-10-CM | POA: Diagnosis not present

## 2015-01-05 DIAGNOSIS — E119 Type 2 diabetes mellitus without complications: Secondary | ICD-10-CM | POA: Diagnosis not present

## 2015-01-05 DIAGNOSIS — Z87891 Personal history of nicotine dependence: Secondary | ICD-10-CM | POA: Diagnosis not present

## 2015-01-05 DIAGNOSIS — Z794 Long term (current) use of insulin: Secondary | ICD-10-CM | POA: Diagnosis not present

## 2015-01-05 DIAGNOSIS — K219 Gastro-esophageal reflux disease without esophagitis: Secondary | ICD-10-CM | POA: Diagnosis not present

## 2015-01-05 DIAGNOSIS — N183 Chronic kidney disease, stage 3 (moderate): Secondary | ICD-10-CM | POA: Diagnosis not present

## 2015-01-05 NOTE — Patient Outreach (Signed)
[facility]) Care Management  01/05/2015  Patrick [name] 1931/11/18 [id number]   Telephonic Care Management Note  Referral Date: 12/27/14 Referral Source: [facility]  Referral Issue: Patient was discharged from [facility] on 12/26/14 for HF, DM and Renal failure. Patient also needs med management. (Referral saved under Media tab).   Admissions: (1) 12/22/14 - 12/25/13 CHF; acute respiratory failure secondary to pulmonary edema from congestive heart failure, and volume overload ED: (1) Insurance: [place] [id number] PCP: Dr. Maryland Pink - patient states last appt was last week 12/2014. HH: [facility] Referral sent 12/27/14.   Outreach call #3 to patient. Patient reached. Screening completed.   Social:  Patient lives in his home with his wife.  Ambulates with walker.  Home alone during the day while wife works at school; wife gets home around 2pm.  Pain:  No  Caregiver:  Wife/Vicki Transportation:  Self DME:  Environmental consultant, Glucometer, BP cuff  THN Conditions:  DM, Chronic Diastolic HF,  Other:  CKD, HTN, A-Fib Patient stated he does not have HF.  Weight 173 on 12/26/14 discharge Height 5'7" Self checks Blood sugars twice a day.  States morning blood sugar was 100.  States wife monitors his BP.   Medications:  More than 10  Wife manages medications and uses a medication box.   Co-pays:  States affordable.   Pharmacy:  Mail order.  RN CM instructed patient that [facility] Pharmacist is attempting to reach patient by phone and please take call.   Consent Patient gave verbal consent for [facility] services and permission to discuss PHI with wife/Vicki.    Plan:  RN CM instructed patient that [facility] Pharmacist is attempting to reach patient by phone and please take call.  (Pharmacist updated via in-basket communication.) RN CM will contact MD for confirmation on HF diagnosis and provide HF education.  RN CM notified [place] Assistant: agreed to services/case opened. RN CM mailed successful outreach letter, [facility] package and consent for service form to patient.  RN CM instructed in next follow-up call within 30 days for initial assessment. RN CM advised to please notify MD of any changes in condition prior to scheduled appt's.   RN CM provided contact name and # [phone] or main office # [phone] and 24-hour nurse line # [phone]. RN CM confirmed patient is aware of 911 services for urgent emergency needs.  Mariann Laster, RN, BSN, [facility] CCM  [employer] Management Coordinator [phone] Direct [phone] Cell [phone] Office [phone] Fax

## 2015-01-06 ENCOUNTER — Other Ambulatory Visit: Payer: Self-pay | Admitting: Pharmacist

## 2015-01-06 NOTE — Patient Outreach (Addendum)
TRAJON MATHES is a 81 y.o. male referred to pharmacy for medication management. Called and spoke with Mr. Weick. Mr. Banducci reports that his wife manages his medications for him. Patient asks that I speak with his wife instead about his medications. Reports that his wife is currently at work and works each day until about 2 pm. Mr. Para reports that he has no medication questions for me at this time.  Schedule with Mr. Wiedenhoeft that I will call back next Wednesday, 01/11/15 after 3 pm to speak with his wife.  Harlow Asa, PharmD Clinical Pharmacist [place] [facility] [phone]

## 2015-01-09 DIAGNOSIS — K219 Gastro-esophageal reflux disease without esophagitis: Secondary | ICD-10-CM | POA: Diagnosis not present

## 2015-01-09 DIAGNOSIS — Z87891 Personal history of nicotine dependence: Secondary | ICD-10-CM | POA: Diagnosis not present

## 2015-01-09 DIAGNOSIS — N183 Chronic kidney disease, stage 3 (moderate): Secondary | ICD-10-CM | POA: Diagnosis not present

## 2015-01-09 DIAGNOSIS — Z794 Long term (current) use of insulin: Secondary | ICD-10-CM | POA: Diagnosis not present

## 2015-01-09 DIAGNOSIS — E119 Type 2 diabetes mellitus without complications: Secondary | ICD-10-CM | POA: Diagnosis not present

## 2015-01-09 DIAGNOSIS — I5043 Acute on chronic combined systolic (congestive) and diastolic (congestive) heart failure: Secondary | ICD-10-CM | POA: Diagnosis not present

## 2015-01-09 DIAGNOSIS — E785 Hyperlipidemia, unspecified: Secondary | ICD-10-CM | POA: Diagnosis not present

## 2015-01-09 DIAGNOSIS — D638 Anemia in other chronic diseases classified elsewhere: Secondary | ICD-10-CM | POA: Diagnosis not present

## 2015-01-09 DIAGNOSIS — I4891 Unspecified atrial fibrillation: Secondary | ICD-10-CM | POA: Diagnosis not present

## 2015-01-10 ENCOUNTER — Inpatient Hospital Stay: Payer: Commercial Managed Care - HMO | Attending: Oncology

## 2015-01-10 ENCOUNTER — Inpatient Hospital Stay (HOSPITAL_BASED_OUTPATIENT_CLINIC_OR_DEPARTMENT_OTHER): Payer: Commercial Managed Care - HMO | Admitting: Oncology

## 2015-01-10 ENCOUNTER — Inpatient Hospital Stay: Payer: Commercial Managed Care - HMO

## 2015-01-10 ENCOUNTER — Ambulatory Visit: Payer: Commercial Managed Care - HMO | Attending: Family | Admitting: Family

## 2015-01-10 ENCOUNTER — Encounter: Payer: Self-pay | Admitting: Family

## 2015-01-10 VITALS — BP 90/60 | HR 83 | Resp 20 | Ht 68.0 in | Wt 174.0 lb

## 2015-01-10 VITALS — BP 104/62 | HR 78 | Temp 96.7°F | Resp 16

## 2015-01-10 DIAGNOSIS — D631 Anemia in chronic kidney disease: Secondary | ICD-10-CM | POA: Diagnosis not present

## 2015-01-10 DIAGNOSIS — E119 Type 2 diabetes mellitus without complications: Secondary | ICD-10-CM | POA: Insufficient documentation

## 2015-01-10 DIAGNOSIS — E785 Hyperlipidemia, unspecified: Secondary | ICD-10-CM | POA: Insufficient documentation

## 2015-01-10 DIAGNOSIS — N183 Chronic kidney disease, stage 3 (moderate): Secondary | ICD-10-CM

## 2015-01-10 DIAGNOSIS — I482 Chronic atrial fibrillation, unspecified: Secondary | ICD-10-CM

## 2015-01-10 DIAGNOSIS — Z7982 Long term (current) use of aspirin: Secondary | ICD-10-CM | POA: Insufficient documentation

## 2015-01-10 DIAGNOSIS — D638 Anemia in other chronic diseases classified elsewhere: Secondary | ICD-10-CM

## 2015-01-10 DIAGNOSIS — I4891 Unspecified atrial fibrillation: Secondary | ICD-10-CM | POA: Insufficient documentation

## 2015-01-10 DIAGNOSIS — N189 Chronic kidney disease, unspecified: Principal | ICD-10-CM

## 2015-01-10 DIAGNOSIS — Z794 Long term (current) use of insulin: Secondary | ICD-10-CM | POA: Insufficient documentation

## 2015-01-10 DIAGNOSIS — K219 Gastro-esophageal reflux disease without esophagitis: Secondary | ICD-10-CM | POA: Diagnosis not present

## 2015-01-10 DIAGNOSIS — I5042 Chronic combined systolic (congestive) and diastolic (congestive) heart failure: Secondary | ICD-10-CM | POA: Insufficient documentation

## 2015-01-10 DIAGNOSIS — I129 Hypertensive chronic kidney disease with stage 1 through stage 4 chronic kidney disease, or unspecified chronic kidney disease: Secondary | ICD-10-CM | POA: Insufficient documentation

## 2015-01-10 DIAGNOSIS — I5032 Chronic diastolic (congestive) heart failure: Secondary | ICD-10-CM

## 2015-01-10 DIAGNOSIS — I952 Hypotension due to drugs: Secondary | ICD-10-CM

## 2015-01-10 DIAGNOSIS — Z7901 Long term (current) use of anticoagulants: Secondary | ICD-10-CM | POA: Insufficient documentation

## 2015-01-10 DIAGNOSIS — E1159 Type 2 diabetes mellitus with other circulatory complications: Secondary | ICD-10-CM

## 2015-01-10 DIAGNOSIS — Z87891 Personal history of nicotine dependence: Secondary | ICD-10-CM | POA: Diagnosis not present

## 2015-01-10 DIAGNOSIS — Z79899 Other long term (current) drug therapy: Secondary | ICD-10-CM

## 2015-01-10 DIAGNOSIS — I959 Hypotension, unspecified: Secondary | ICD-10-CM | POA: Insufficient documentation

## 2015-01-10 DIAGNOSIS — D509 Iron deficiency anemia, unspecified: Secondary | ICD-10-CM

## 2015-01-10 LAB — HEMOGLOBIN: HEMOGLOBIN: 9.8 g/dL — AB (ref 13.0–18.0)

## 2015-01-10 MED ORDER — EPOETIN ALFA 40000 UNIT/ML IJ SOLN
40000.0000 [IU] | Freq: Once | INTRAMUSCULAR | Status: AC
  Administered 2015-01-10: 40000 [IU] via SUBCUTANEOUS
  Filled 2015-01-10: qty 1

## 2015-01-10 NOTE — Patient Instructions (Addendum)
Continue weighing daily and call for an overnight weight gain of > 2 pounds or a weekly weight gain of >5 pounds.  Should the blood pressure remain low would recommend a provider stop either clonidine or hydralazine.

## 2015-01-10 NOTE — Progress Notes (Signed)
Subjective:    Patient ID: Patrick [name], male    DOB: Aug 26, 1931, 81 y.o.   MRN: [id number]  Congestive Heart Failure Presents for follow-up visit. The disease course has been stable. Associated symptoms include edema. Pertinent negatives include no abdominal pain, chest pain, fatigue, orthopnea, palpitations or shortness of breath. The symptoms have been stable. Past treatments include ACE inhibitors, beta blockers and salt and fluid restriction. The treatment provided moderate relief. Compliance with prior treatments has been good. His past medical history is significant for anemia, arrhythmia, DM and HTN. He has one 1st degree relative with heart disease. Compliance with total regimen is 76-100%.  Atrial Fibrillation Presents for follow-up visit. Symptoms include hypotension. Symptoms are negative for bradycardia, chest pain, dizziness, palpitations, shortness of breath and weakness. The symptoms have been stable. Past treatments include beta blockers, aspirin and anticoagulant. Compliance with prior treatments has been good. Past medical history includes atrial fibrillation, CHF, HTN and hyperlipidemia.   Past Medical History  Diagnosis Date  . Hypertension   . Diabetes mellitus without complication ([place])   . Hyperlipidemia   . History of gastroesophageal reflux (GERD)   . CKD (chronic kidney disease), stage III     a. stage III-IV  . Permanent atrial fibrillation ([place])     a. Dx 12/2011, Rate-controlled, chronic Xarelto (renal dosing).  . Bell's palsy   . Chronic diastolic CHF (congestive heart failure) ([employer] [employer])     a. 11/2012 Echo: EF 60-65%, mod conc LVH, mildly dil LA/RA, mild Ao sclerosis w/o stenosis; b. stage III-IV    . Anemia of chronic disease     a. plan of oncology to start Procrit - received this during admission 12/2013  . Chronic atrial fibrillation ([place])   . Hyperlipidemia   . GERD (gastroesophageal reflux disease)   . Bell's palsy   . Amputated finger      Past Surgical History  Procedure Laterality Date  . Finger surgery      right hand  . Colonoscopy  09/2011  . Esophagogastroduodenoscopy    . Appendectomy      Family History  Problem Relation Age of Onset  . Heart attack Brother   . Hypertension    . Diabetes Mother   . Diabetes Sister     Social History  Substance Use Topics  . Smoking status: Former Smoker -- 0.25 packs/day for 10 years    Types: Cigars    Quit date: 05/06/1970  . Smokeless tobacco: Never Used  . Alcohol Use: No    Allergies  Allergen Reactions  . No Known Allergies     Prior to Admission medications   Medication Sig Start Date End Date Taking? Authorizing Provider  aspirin 81 MG tablet Take 81 mg by mouth daily.   Yes Historical Provider, MD  cefUROXime (CEFTIN) 250 MG tablet Take 1 tablet (250 mg total) by mouth 2 (two) times daily with a meal. 12/26/14  Yes Bettey Costa, MD  cloNIDine (CATAPRES) 0.1 MG tablet Take 1 tablet (0.1 mg total) by mouth 2 (two) times daily. 05/10/13  Yes Minna Merritts, MD  docusate sodium (COLACE) 100 MG capsule Take 100 mg by mouth 2 (two) times daily.   Yes Historical Provider, MD  enalapril (VASOTEC) 5 MG tablet Take 5 mg by mouth daily.  07/16/13  Yes Historical Provider, MD  Ferrous Sulfate (IRON) 325 (65 FE) MG TABS Take 1 tablet by mouth daily.   Yes Historical Provider, MD  glipiZIDE (GLUCOTROL XL) 10 MG  24 hr tablet Take 10 mg by mouth 2 (two) times daily.   Yes Historical Provider, MD  hydrALAZINE (APRESOLINE) 100 MG tablet TAKE ONE TABLET BY MOUTH THREE TIMES DAILY 07/19/13  Yes Minna Merritts, MD  Insulin Lispro Prot & Lispro (HUMALOG MIX 75/25 [place]) Inject 70 Units into the skin 2 (two) times daily.   Yes Historical Provider, MD  metoprolol (LOPRESSOR) 50 MG tablet Take 1 tablet (50 mg total) by mouth 2 (two) times daily. 02/08/14  Yes Minna Merritts, MD  omeprazole (PRILOSEC) 40 MG capsule Take 40 mg by mouth daily.   Yes Historical Provider, MD   pravastatin (PRAVACHOL) 40 MG tablet Take two tablets daily.   Yes Historical Provider, MD  Rivaroxaban (XARELTO) 15 MG TABS tablet Take 1 tablet (15 mg total) by mouth daily. 05/05/12  Yes Minna Merritts, MD  torsemide (DEMADEX) 20 MG tablet TAKE ONE TABLET BY MOUTH TWICE DAILY AS NEEDED Patient taking differently: 20 mg daily.  03/21/14  Yes Minna Merritts, MD      Review of Systems  Constitutional: Negative for appetite change and fatigue.  HENT: Negative for congestion, postnasal drip and sore throat.   Eyes: Negative.   Respiratory: Positive for cough (little bit). Negative for chest tightness and shortness of breath.   Cardiovascular: Negative for chest pain, palpitations and leg swelling.  Gastrointestinal: Negative for abdominal pain and abdominal distention.  Endocrine: Negative.   Genitourinary: Negative.   Musculoskeletal: Negative for back pain and neck pain.  Skin: Negative.   Allergic/Immunologic: Negative.   Neurological: Negative for dizziness, weakness, light-headedness and headaches.  Hematological: Negative for adenopathy. Bruises/bleeds easily.  Psychiatric/Behavioral: Positive for sleep disturbance (sleeping on 2 pillows; napping during the day). Negative for dysphoric mood. The patient is not nervous/anxious.        Objective:   Physical Exam  Constitutional: He is oriented to person, place, and time. He appears well-developed and well-nourished.  HENT:  Head: Normocephalic and atraumatic.  Eyes: Conjunctivae are normal. Pupils are equal, round, and reactive to light.  Neck: Normal range of motion. Neck supple.  Cardiovascular: Normal rate.  An irregular rhythm present.  Pulmonary/Chest: Effort normal. He has no wheezes. He has no rales.  Abdominal: Soft. He exhibits no distension. There is no tenderness.  Musculoskeletal: He exhibits edema (trace in bilateral lower legs). He exhibits no tenderness.  Neurological: He is alert and oriented to person,  place, and time.  Skin: Skin is warm and dry.  Psychiatric: He has a normal mood and affect. His behavior is normal. Thought content normal.  Nursing note and vitals reviewed.   BP 90/60 mmHg  Pulse 83  Resp 20  Ht 5\' 8"  (1.727 m)  Wt 174 lb (78.926 kg)  BMI 26.46 kg/m2  SpO2 96%       Assessment & Plan:  1: Chronic heart failure with preserved ejection fraction- Patient presents currently without endorsing any fatigue, shortness of breath or edema at this time. He continues to weigh himself daily and home weight is gradually increasing as his appetite has improved. By our scale, he's lost 6 pounds since he was last here. Reminded his wife to call for an overnight weight gain of >2 pounds or a weekly weight gain of >5 pounds. He is not adding any salt to his food and is trying to watch the sodium and potassium content of foods. He has been in the hospital since he was last here due to a pleural effusion.  Has an appointment with the pulmonologist on 02/01/15. 2: Atrial fibrillation- Currently rate controlled at this time. Is taking metoprolol and rivaroxaban.  3: Hypotension- Blood pressure low even on recheck although patient currently denies any dizziness. His wife says that his blood pressure at home this morning was 176/95. Instructed her to continue to monitor this and should it consistently remain low or he begins to feel dizzy with position changes, that his clonidine or hydralazine may need to be decreased.  4: Diabetes- Fating glucose this morning was 111. Continues to take his insulin daily along with glipizide.   Return in 3 months or sooner for any questions/problems before the next office visit.

## 2015-01-11 ENCOUNTER — Other Ambulatory Visit: Payer: Self-pay | Admitting: Pharmacist

## 2015-01-11 ENCOUNTER — Ambulatory Visit: Payer: Commercial Managed Care - HMO | Admitting: Pharmacist

## 2015-01-11 DIAGNOSIS — E785 Hyperlipidemia, unspecified: Secondary | ICD-10-CM | POA: Diagnosis not present

## 2015-01-11 DIAGNOSIS — I5043 Acute on chronic combined systolic (congestive) and diastolic (congestive) heart failure: Secondary | ICD-10-CM | POA: Diagnosis not present

## 2015-01-11 DIAGNOSIS — K219 Gastro-esophageal reflux disease without esophagitis: Secondary | ICD-10-CM | POA: Diagnosis not present

## 2015-01-11 DIAGNOSIS — E119 Type 2 diabetes mellitus without complications: Secondary | ICD-10-CM | POA: Diagnosis not present

## 2015-01-11 DIAGNOSIS — I4891 Unspecified atrial fibrillation: Secondary | ICD-10-CM | POA: Diagnosis not present

## 2015-01-11 DIAGNOSIS — Z794 Long term (current) use of insulin: Secondary | ICD-10-CM | POA: Diagnosis not present

## 2015-01-11 DIAGNOSIS — D638 Anemia in other chronic diseases classified elsewhere: Secondary | ICD-10-CM | POA: Diagnosis not present

## 2015-01-11 DIAGNOSIS — Z87891 Personal history of nicotine dependence: Secondary | ICD-10-CM | POA: Diagnosis not present

## 2015-01-11 DIAGNOSIS — N183 Chronic kidney disease, stage 3 (moderate): Secondary | ICD-10-CM | POA: Diagnosis not present

## 2015-01-11 NOTE — Patient Outreach (Signed)
Patrick [name] is a 81 y.o. male referred to pharmacy for medication management. Called to follow up with patient after 3pm per his request in order to speak with both him and his wife. Unable to reach patient on either his home or mobile number. Left a HIPAA compliant message on the patient's voicemail. If have not heard from patient by 01/13/15, will give him another call at that time.    Harlow Asa, PharmD Clinical Pharmacist [employer] [facility] [phone]

## 2015-01-13 ENCOUNTER — Other Ambulatory Visit: Payer: Self-pay | Admitting: Pharmacist

## 2015-01-13 NOTE — Patient Outreach (Signed)
Patrick [name]) Care Management  Patrick [name]   01/13/2015  Patrick [name] 1932-01-11 [id number]  Subjective: Patrick [name] is a 81 y.o. male referred to pharmacy for medication management. Called to follow up with patient and, per patient's request, speak with his wife regarding his medications.  Patrick [name] reports that she organizes the patient's medications in a weekly pillbox and then hands the patient each dose. Reports that she goes with patient to each of his medical visits. Review with Patrick [name] each of the patient's medications including name, dose, indication and administration.   Patrick [name] reports that the patient saw a provider this week who advised them to decrease his metoprolol tartrate dose from twice daily to once daily. Patrick [name] reports that the patient has an appointment with his Cardiologist, Patrick [name], on Monday and that she is planning to discuss this change with him at that time. Discuss with Patrick [name] how this form of metoprolol, the tartrate form, is typically dosed twice daily for all day coverage. Patrick [name] states that she will discuss this with Patrick [name] on Monday. Patrick [name] reports that she is also planning to discuss the patient's torsemide dose. Reports that the patient's torsemide is co-managed by his Cardiologist and kidney doctor. Reports that he is currently taking 1 tablet once daily. However, reports that Patrick [name] will re-evaluate this dose at [name] appointment.  Patrick [name] reports that she keeps a blood pressure log for the patient, checking his blood pressure daily. Reports a blood pressure this morning of 140/79 with pulse of 71 and yesterday a blood pressure of 105/65 with pulse of 63.   Reports she monitors the patient's blood sugar twice daily. Reports that he had a fasting blood sugar this morning of 111 mg/dL. Reports a blood sugar last night, about 30 minutes  after supper or 148 mg/dL Reports that patient is taking both the Humalog mix twice daily before meals as well as glipizide ER twice daily.  Reports patient takes Xarelto once daily, but not always with a meal. Counseled Patrick [name] on the importance of the patient taking Xarelto each day with the same meal, preferably his evening meal. Patrick [name] verbalized understanding. Reviewed with Patrick [name] signs of bleeding and bruising. Counseled Patrick [name] that patient is at an increased risk of bleeding by taking aspirin with Xarelto. Patrick [name] reports that Patrick [name] has been on this combination for years. Counseled Patrick [name] to discuss the need for aspirin, in addition to Xarelto, with Patrick [name] on Monday.  Objective:   Current Medications: Current Outpatient Prescriptions  Medication Sig Dispense Refill  . aspirin 81 MG tablet Take 81 mg by mouth daily.    . cloNIDine (CATAPRES) 0.1 MG tablet Take 1 tablet (0.1 mg total) by mouth 2 (two) times daily. 60 tablet 11  . docusate sodium (COLACE) 100 MG capsule Take 100 mg by mouth 2 (two) times daily.    . enalapril (VASOTEC) 5 MG tablet Take 5 mg by mouth daily.     . Ferrous Sulfate (IRON) 325 (65 FE) MG TABS Take 1 tablet by mouth daily.    Marland Kitchen glipiZIDE (GLUCOTROL XL) 10 MG 24 hr tablet Take 10 mg by mouth 2 (two) times daily.    . hydrALAZINE (APRESOLINE) 100 MG tablet TAKE ONE TABLET BY MOUTH THREE TIMES DAILY 270 tablet 0  . Insulin Lispro Prot & Lispro (HUMALOG MIX 75/25 [place]) Inject 70 Units into the skin 2 (two) times daily.    Marland Kitchen  metoprolol (LOPRESSOR) 50 MG tablet Take 1 tablet (50 mg total) by mouth 2 (two) times daily. 180 tablet 3  . omeprazole (PRILOSEC) 40 MG capsule Take 40 mg by mouth daily.    . pravastatin (PRAVACHOL) 40 MG tablet Take two tablets daily.    . Rivaroxaban (XARELTO) 15 MG TABS tablet Take 1 tablet (15 mg total) by mouth daily. 30 tablet 3  . torsemide (DEMADEX) 20 MG tablet TAKE  ONE TABLET BY MOUTH TWICE DAILY AS NEEDED (Patient taking differently: 20 mg daily. ) 180 tablet 3   No current facility-administered medications for this visit.   Facility-Administered Medications Ordered in Other Visits  Medication Dose Route Frequency Provider Last Rate Last Dose  . epoetin alfa (EPOGEN,PROCRIT) injection 40,000 Units  40,000 Units Subcutaneous Once Patrick Huger, MD        Functional Status: In your present state of health, do you have any difficulty performing the following activities: 01/10/2015 12/23/2014  Hearing? N N  Vision? N N  Difficulty concentrating or making decisions? N N  Walking or climbing stairs? N Y  Dressing or bathing? N N  Doing errands, shopping? N Y    Fall/Depression Screening: PHQ 2/9 Scores 01/10/2015 10/18/2014  PHQ - 2 Score 0 0    Assessment:  1) Patient currently taking metoprolol TARTRATE 50 mg once daily. Given the duration of action of this form of metoprolol, it is most appropriate that the dose be split into twice daily dosing for around-the-clock effect.  2) Patrick [name] is currently taking both Xarelto 15 mg once daily and aspirin 81 mg daily. Per Xarelto labeling, concomitant aspirin use with Xarelto has been identified as an independent risk factor for major bleeding in efficacy trials. Patient also high fall risk.  3) Patrick [name] most recent eGFR from 12/26/14 was 28 ml/min. Pravastatin is partially renally cleared.   4) Patient is on both gipizide ER twice daily and Humalog Mix 75/25 twice daily. Note that this combination does not offer much benefit and may put the patient at increased risk of hypoglycemia. Per note in Care Everywhere from patient's Endocrinologist, Patrick [name], from 12/19/14, this physician did not note patient to be taking glipizide at that time, only Humalog Mix for blood sugar control. Per 06/15/14 note from the same provider, Patrick [name] discontinued the patient's glipizide at that time due  to concern about risk of hypoglycemia with ongoing use, particularly in setting of severe kidney disease.   Plan:  1) Mrs. Magallon and Patrick [name] to follow up with patient's Cardiologist about patient's metoprolol dose, patient's use of aspirin with Xarelto and patient's dose of torsemide.  2) Will follow up with patient's Endocrinologist, Patrick [name], to confirm that patient whether the patient should have stopped the glipizide.  3) Given Mr. Granquist most recent eGFR from 12/26/14 of 28 ml/min and that pravastatin is partially renally cleared, will contact patient's Cardiologist to recommend changing patient to an alternative such as atorvastatin.  4) Let Mr and Mrs. Lacivita know that I will follow up with them by phone in 1 week.    Harlow Asa, PharmD Clinical Pharmacist [place] [facility] [phone]

## 2015-01-16 ENCOUNTER — Telehealth: Payer: Self-pay

## 2015-01-16 ENCOUNTER — Other Ambulatory Visit: Payer: Self-pay | Admitting: Pharmacist

## 2015-01-16 NOTE — Patient Outreach (Signed)
Called follow up with patient's Endocrinologist, Dr. Gabriel Carina, to confirm that patient whether the patient should have stopped the glipizide.  Note that patient is on both gipizide ER twice daily and Humalog Mix 75/25 twice daily. Note that this combination does not offer much benefit and may put the patient at increased risk of hypoglycemia. Per note in Care Everywhere from patient's Endocrinologist, Dr. Lucilla Lame, from 12/19/14, this physician did not note patient to be taking glipizide at that time, only Humalog Mix for blood sugar control. Per 06/15/14 note from the same provider, Dr. Gabriel Carina discontinued the patient's glipizide at that time due to concern about risk of hypoglycemia with ongoing use, particularly in setting of severe kidney disease.  Left message with Nate in Dr. Joycie Peek office. Requested confirmation of discontinuation of glipzide and call to the patient for update and call back to myself. If have not heard back from Dr. Joycie Peek office by 01/18/15, will follow up again at that time.   Harlow Asa, PharmD Clinical Pharmacist [place] [facility] [phone]

## 2015-01-16 NOTE — Telephone Encounter (Signed)
Spoke w/ pt.  He states that he is unsure of what med he takes, as his wife handles all of his meds. She is currently at work, he states that she should be home w/in the hour.  Asked him to have her call back when she gets in.

## 2015-01-16 NOTE — Patient Outreach (Signed)
Received a call back from Harrison at Dr. Joycie Peek office, patient's Endocrinologist. Janett Billow reports that he patient was to have stopped glipizide as of back in March. Janett Billow states that she has placed a call to and left a message with the patient to let him know.   Will follow up with Mr and Mrs. Spadaro on 01/18/15.  Harlow Asa, PharmD Clinical Pharmacist [place] [facility] [phone]

## 2015-01-16 NOTE — Telephone Encounter (Signed)
Would recommend that he hold the asprin        If he is taking metoprolol tartrate 50 mg daily,    Would change to 25 mg twice a day        thx    TG        ----- Message -----     From: Vella Raring, RPH     Sent: 01/14/2015  9:41 PM      To: Minna Merritts, MD

## 2015-01-17 ENCOUNTER — Ambulatory Visit (INDEPENDENT_AMBULATORY_CARE_PROVIDER_SITE_OTHER): Payer: Commercial Managed Care - HMO | Admitting: Nurse Practitioner

## 2015-01-17 ENCOUNTER — Encounter: Payer: Self-pay | Admitting: Nurse Practitioner

## 2015-01-17 VITALS — BP 124/86 | HR 71 | Ht 68.0 in | Wt 176.5 lb

## 2015-01-17 DIAGNOSIS — D638 Anemia in other chronic diseases classified elsewhere: Secondary | ICD-10-CM | POA: Diagnosis not present

## 2015-01-17 DIAGNOSIS — J90 Pleural effusion, not elsewhere classified: Secondary | ICD-10-CM

## 2015-01-17 DIAGNOSIS — I482 Chronic atrial fibrillation, unspecified: Secondary | ICD-10-CM

## 2015-01-17 DIAGNOSIS — N183 Chronic kidney disease, stage 3 unspecified: Secondary | ICD-10-CM | POA: Insufficient documentation

## 2015-01-17 DIAGNOSIS — I5042 Chronic combined systolic (congestive) and diastolic (congestive) heart failure: Secondary | ICD-10-CM

## 2015-01-17 DIAGNOSIS — J948 Other specified pleural conditions: Secondary | ICD-10-CM

## 2015-01-17 DIAGNOSIS — Z794 Long term (current) use of insulin: Secondary | ICD-10-CM | POA: Diagnosis not present

## 2015-01-17 DIAGNOSIS — I10 Essential (primary) hypertension: Secondary | ICD-10-CM | POA: Diagnosis not present

## 2015-01-17 DIAGNOSIS — K219 Gastro-esophageal reflux disease without esophagitis: Secondary | ICD-10-CM | POA: Diagnosis not present

## 2015-01-17 DIAGNOSIS — E119 Type 2 diabetes mellitus without complications: Secondary | ICD-10-CM | POA: Diagnosis not present

## 2015-01-17 DIAGNOSIS — E785 Hyperlipidemia, unspecified: Secondary | ICD-10-CM | POA: Diagnosis not present

## 2015-01-17 DIAGNOSIS — R0789 Other chest pain: Secondary | ICD-10-CM

## 2015-01-17 DIAGNOSIS — I5043 Acute on chronic combined systolic (congestive) and diastolic (congestive) heart failure: Secondary | ICD-10-CM | POA: Diagnosis not present

## 2015-01-17 DIAGNOSIS — Z87891 Personal history of nicotine dependence: Secondary | ICD-10-CM | POA: Diagnosis not present

## 2015-01-17 DIAGNOSIS — I4891 Unspecified atrial fibrillation: Secondary | ICD-10-CM | POA: Diagnosis not present

## 2015-01-17 NOTE — Patient Instructions (Addendum)
Medication Instructions:  Your physician recommends that you continue on your current medications as directed. Please refer to the Current Medication list given to you today.   Labwork: none  Testing/Procedures: none  Follow-Up: Your physician recommends that you schedule a follow-up appointment with [facility]    Any Other Special Instructions Will Be Listed Below (If Applicable).     If you need a refill on your cardiac medications before your next appointment, please call your pharmacy.

## 2015-01-17 NOTE — Telephone Encounter (Signed)
Pt has appt to see Dr. [name] today.

## 2015-01-17 NOTE — Progress Notes (Signed)
Patient Name: Patrick [name] Date of Encounter: 01/17/2015  Primary Care Provider:  Maryland Pink, MD Primary Cardiologist:  Johnny Bridge, MD   Chief Complaint  81 y/o male with a h/o persistent AF, HTN, CKD III-IV, and chronic combined syst/diast CHF, who presents for f/u after recent hospitalization 2/2 atypical chest pain, dyspnea, and left pleural effusion req thoracentesis.  Past Medical History   Past Medical History  Diagnosis Date  . Hypertension   . Diabetes mellitus without complication ([place])   . Hyperlipidemia   . History of gastroesophageal reflux (GERD)   . CKD (chronic kidney disease), stage III     a. stage III-IV  . Permanent atrial fibrillation ([place])     a. Dx 12/2011, Rate-controlled, chronic Xarelto (renal dosing) - CHA2DS2VASc = 5.  . Bell's palsy   . Chronic combined systolic and diastolic CHF (congestive heart failure) ([place])     a. 11/2012 Echo: EF 60-65%, mod conc LVH, mildly dil LA/RA, mild Ao sclerosis w/o stenosis; b. 12/2014 Echo: EF 40-45%, prob mid-apicalanteroseptal, ant, apical HK, Gr2 DD, mod dil LA, mildly dil RA (technically difficult study).  . Anemia of chronic disease     a. plan of oncology to start Procrit - received during admission 12/2013  . Hyperlipidemia   . GERD (gastroesophageal reflux disease)   . Bell's palsy   . Amputated finger   . Pleural effusion, left     a. 12/2014 s/p thoracentesis - protein <3, LDH 123, no malignancy.  . Atypical chest pain     a. 12/2014 Neg CE - in setting of L pleural effusion.   Past Surgical History  Procedure Laterality Date  . Finger surgery      right hand  . Colonoscopy  09/2011  . Esophagogastroduodenoscopy    . Appendectomy      Allergies  Allergies  Allergen Reactions  . No Known Allergies     HPI  81 y/o male with the above complex problem list.  He has a h/o chronic combined syst/diast CHF, AF on xarelto, HTN, HL, anemia of chronic dzs followed by heme-onc, and CKD  III-IV.  In early October, he was admitted with atypical chest pain that was worse with inspiration and was found to have a moderate, chronic, left pleural effusion.  He was also dx with UTI.  CE were negative.  He underwent L sided thoracentesis with improvement in pleuritic c/p.  Effusion was felt to be transudative in the setting of chronic CHF and anemia.  He was subsequently d/c'd home.  Since d/c, he has been doing well.  He denies any recurrence of chest pain or dyspnea.  He occasionally notes left mid-back pain that resolves with lying down.  He thinks it may be related to his thoracentesis site though pain is not present with palpation.  He weighs himself daily and his wts have been stable.  He denies palpitations, pnd, orthopnea, n, v, dizziness, syncope, edema, weight gain, or early satiety.   Home Medications  Prior to Admission medications   Medication Sig Start Date End Date Taking? Authorizing Provider  aspirin 81 MG tablet Take 81 mg by mouth daily.   Yes Historical Provider, MD  cloNIDine (CATAPRES) 0.1 MG tablet Take 1 tablet (0.1 mg total) by mouth 2 (two) times daily. 05/10/13  Yes Minna Merritts, MD  docusate sodium (COLACE) 100 MG capsule Take 100 mg by mouth 2 (two) times daily.   Yes Historical Provider, MD  enalapril (VASOTEC) 5 MG  tablet Take 5 mg by mouth daily.  07/16/13  Yes Historical Provider, MD  Ferrous Sulfate (IRON) 325 (65 FE) MG TABS Take 1 tablet by mouth daily.   Yes Historical Provider, MD  glipiZIDE (GLUCOTROL XL) 10 MG 24 hr tablet Take 10 mg by mouth 2 (two) times daily.   Yes Historical Provider, MD  hydrALAZINE (APRESOLINE) 100 MG tablet TAKE ONE TABLET BY MOUTH THREE TIMES DAILY 07/19/13  Yes Minna Merritts, MD  Insulin Lispro Prot & Lispro (HUMALOG MIX 75/25 [place]) Inject 70 Units into the skin 2 (two) times daily.   Yes Historical Provider, MD  metoprolol (LOPRESSOR) 50 MG tablet Take 1 tablet (50 mg total) by mouth 2 (two) times daily. 02/08/14  Yes  Minna Merritts, MD  omeprazole (PRILOSEC) 40 MG capsule Take 40 mg by mouth daily.   Yes Historical Provider, MD  pravastatin (PRAVACHOL) 40 MG tablet Take two tablets daily.   Yes Historical Provider, MD  Rivaroxaban (XARELTO) 15 MG TABS tablet Take 1 tablet (15 mg total) by mouth daily. 05/05/12  Yes Minna Merritts, MD  torsemide (DEMADEX) 20 MG tablet TAKE ONE TABLET BY MOUTH TWICE DAILY AS NEEDED Patient taking differently: 20 mg daily.  03/21/14  Yes Minna Merritts, MD    Review of Systems  Doing well as above.  He denies chest pain, palpitations, dyspnea, pnd, orthopnea, n, v, dizziness, syncope, edema, weight gain, or early satiety.  All other systems reviewed and are otherwise negative except as noted above.  Physical Exam  VS:  BP 124/86 mmHg  Pulse 71  Ht 5\' 8"  (1.727 m)  Wt 176 lb 8 oz (80.06 kg)  BMI 26.84 kg/m2  SpO2 91% , BMI Body mass index is 26.84 kg/(m^2). GEN: Well nourished, well developed, in no acute distress. HEENT: normal. Neck: Supple, no JVD, carotid bruits, or masses. Cardiac: IR, IR, no murmurs, rubs, or gallops. No clubbing, cyanosis, edema.  Radials/DP/PT 2+ and equal bilaterally.  Respiratory:  Respirations regular and unlabored, clear to auscultation bilaterally. GI: Soft, nontender, nondistended, BS + x 4. MS: no deformity or atrophy. Skin: warm and dry, no rash. Neuro:  Strength and sensation are intact. Psych: Normal affect.  Accessory Clinical Findings  ECG - AF, 71, no acute st/t changes.  Assessment & Plan  1.  Chronic combined systolic and diastolic CHF:  He has been doing well since d/c with dyspnea or wt gain.  He remains on bb, acei, clonidine, hydralazine and torsemide with good HR and BP control.  He is followed in CHF clinic.  We discussed the importance of daily weights, sodium restriction, medication compliance, and symptom reporting and he verbalizes understanding.  2.  Left Pleural Effusion:  S/p thoracentesis earlier this  month.  Transudative (protein <3, LDH 123) in setting of CHF and chronic anemia.  No evidence for malignancy on path. Doing well.  Site has completely healed and looks good.  He has f/u with pulmonology in early November.  3.  Essential HTN:  Stable.  4.  Atypical Chest Pain:  Pt had pleuritic c/p in setting of L sided pleural effusion.  No recurrence.  CE negative during admission.  No indication for stress testing @ this time.  5.  Persistent AFib:  Stable rate.  Cont bb/xarelto (CHADS2VASc = 5).  6.  DM II:  Insulin per IM.  7.  HL:  On statin.  LDL 43 in 12/2013 w/ nl LFT's earlier this month.  8.  CKD III-IV:  Creat stable during recent admission.  Cont acei.  9.  Anemia of chronic dzs:  Followed by hematology with scheduled procrit injections. Hgb 9.8 on 10/18.  10.  Dispo:  F/u in CHF clinic as scheduled.   Murray Hodgkins, NP 01/17/2015, 3:43 PM

## 2015-01-18 ENCOUNTER — Other Ambulatory Visit: Payer: Self-pay | Admitting: Pharmacist

## 2015-01-18 NOTE — Patient Outreach (Signed)
Called to follow up with Mr and Mrs. Devincentis about Mr. Krass appointment with his Cardiologist and the patient's glipizide. Mrs. Bessler states that she has stopped the patient's aspirin per instruction from Cardiology. However, Mrs. Sarafian then informs me that she is busy as her son just got out of the hospital today. Offer to call back on Friday.  Will call the patient and his wife back on 01/20/15.  Harlow Asa, PharmD Clinical Pharmacist [facility] [phone]

## 2015-01-19 ENCOUNTER — Other Ambulatory Visit: Payer: Commercial Managed Care - HMO

## 2015-01-19 DIAGNOSIS — E785 Hyperlipidemia, unspecified: Secondary | ICD-10-CM | POA: Diagnosis not present

## 2015-01-19 DIAGNOSIS — D638 Anemia in other chronic diseases classified elsewhere: Secondary | ICD-10-CM | POA: Diagnosis not present

## 2015-01-19 DIAGNOSIS — I4891 Unspecified atrial fibrillation: Secondary | ICD-10-CM | POA: Diagnosis not present

## 2015-01-19 DIAGNOSIS — Z794 Long term (current) use of insulin: Secondary | ICD-10-CM | POA: Diagnosis not present

## 2015-01-19 DIAGNOSIS — K219 Gastro-esophageal reflux disease without esophagitis: Secondary | ICD-10-CM | POA: Diagnosis not present

## 2015-01-19 DIAGNOSIS — I5043 Acute on chronic combined systolic (congestive) and diastolic (congestive) heart failure: Secondary | ICD-10-CM | POA: Diagnosis not present

## 2015-01-19 DIAGNOSIS — Z87891 Personal history of nicotine dependence: Secondary | ICD-10-CM | POA: Diagnosis not present

## 2015-01-19 DIAGNOSIS — N183 Chronic kidney disease, stage 3 (moderate): Secondary | ICD-10-CM | POA: Diagnosis not present

## 2015-01-19 DIAGNOSIS — E119 Type 2 diabetes mellitus without complications: Secondary | ICD-10-CM | POA: Diagnosis not present

## 2015-01-19 NOTE — Patient Outreach (Signed)
[facility]) Care Management  01/19/2015  TYCE TRINER 09-18-1931 [id number]  Telephonic Care Management Note  Outreach call #1 for Initial Assessment.   Patient not reached.   Plan:  RN CM left HIPAA compliant voice message with name and call back #.  RN CM will schedule for next outreach call within 1 week.   Mariann Laster, RN, BSN, [facility], CCM  [employer] Management Coordinator [phone] Direct [phone] Cell [phone] Office [phone] Fax

## 2015-01-20 ENCOUNTER — Other Ambulatory Visit: Payer: Self-pay | Admitting: Pharmacist

## 2015-01-20 NOTE — Patient Outreach (Signed)
Called to follow up with Mr and Mrs. Cerveny about Mr. Kil appointment with his Cardiologist and the patient's glipizide. No answer. Will call the patient and his wife back again next week.  Harlow Asa, PharmD Clinical Pharmacist [facility] [phone]

## 2015-01-22 NOTE — Progress Notes (Signed)
Patrick [name]  [phone] [phone]  ID: [name] OB: 11-06-1931  MR#: [id number]  [id number]  Patient Care Team: Maryland Pink, MD as PCP - General (Family Medicine) Alisa Graff, FNP as Nurse Practitioner (Family Medicine) Rise Mu, PA-C as Physician Assistant (Cardiology) Standley Brooking, RN as [place], [facility] as [place] [facility] (Pharmacist) Lloyd Huger, MD as Consulting Physician (Hematology)  CHIEF COMPLAINT:  Chief Complaint  Patient presents with  . Anemia    INTERVAL HISTORY: Patient returns to clinic today for further evaluation, laboratory work, and consideration of additional Procrit. He currently feels well and at his baseline. He does not complain of shortness of breath today. He has chronic weakness and fatigue. He has no neurologic complaints. He denies any weight loss. He denies any recent fevers or illnesses. He denies any chest pain. He has no nausea, vomiting, constipation, or diarrhea. He has no urinary complaints. Patient offers no further specific complaints.   REVIEW OF SYSTEMS:   Review of Systems  Constitutional: Positive for malaise/fatigue. Negative for fever.  Respiratory: Positive for shortness of breath.   Cardiovascular: Negative.   Gastrointestinal: Negative for blood in stool and melena.  Musculoskeletal: Negative.   Neurological: Negative.     As per HPI. Otherwise, a complete review of systems is negatve.  PAST MEDICAL HISTORY: Past Medical History  Diagnosis Date  . Hypertension   . Diabetes mellitus without complication ([place])   . Hyperlipidemia   . History of gastroesophageal reflux (GERD)   . CKD (chronic kidney disease), stage III     a. stage III-IV  . Permanent atrial fibrillation ([place])     a. Dx 12/2011, Rate-controlled, chronic Xarelto (renal dosing) - CHA2DS2VASc = 5.  . Bell's  palsy   . Chronic combined systolic and diastolic CHF (congestive heart failure) ([place])     a. 11/2012 Echo: EF 60-65%, mod conc LVH, mildly dil LA/RA, mild Ao sclerosis w/o stenosis; b. 12/2014 Echo: EF 40-45%, prob mid-apicalanteroseptal, ant, apical HK, Gr2 DD, mod dil LA, mildly dil RA (technically difficult study).  . Anemia of chronic disease     a. plan of oncology to start Procrit - received during admission 12/2013  . GERD (gastroesophageal reflux disease)   . Bell's palsy   . Amputated finger   . Pleural effusion, left     a. 12/2014 s/p thoracentesis - protein <3, LDH 123, no malignancy.  . Atypical chest pain     a. 12/2014 Neg CE - in setting of L pleural effusion.    PAST SURGICAL HISTORY: Past Surgical History  Procedure Laterality Date  . Finger surgery      right hand  . Colonoscopy  09/2011  . Esophagogastroduodenoscopy    . Appendectomy      FAMILY HISTORY Family History  Problem Relation Age of Onset  . Heart attack Brother   . Hypertension    . Diabetes Mother   . Diabetes Sister        ADVANCED DIRECTIVES:    HEALTH MAINTENANCE: Social History  Substance Use Topics  . Smoking status: Former Smoker -- 0.25 packs/day for 10 years    Types: Cigars    Quit date: 05/06/1970  . Smokeless tobacco: Never Used  . Alcohol Use: No     Colonoscopy:  PAP:  Bone density:  Lipid panel:  Allergies  Allergen Reactions  . No Known Allergies  Current Outpatient Prescriptions  Medication Sig Dispense Refill  . aspirin 81 MG tablet Take 81 mg by mouth daily.    . cloNIDine (CATAPRES) 0.1 MG tablet Take 1 tablet (0.1 mg total) by mouth 2 (two) times daily. 60 tablet 11  . docusate sodium (COLACE) 100 MG capsule Take 100 mg by mouth 2 (two) times daily.    . enalapril (VASOTEC) 5 MG tablet Take 5 mg by mouth daily.     . Ferrous Sulfate (IRON) 325 (65 FE) MG TABS Take 1 tablet by mouth daily.    Marland Kitchen glipiZIDE (GLUCOTROL XL) 10 MG 24 hr tablet Take 10 mg by  mouth 2 (two) times daily.    . hydrALAZINE (APRESOLINE) 100 MG tablet TAKE ONE TABLET BY MOUTH THREE TIMES DAILY 270 tablet 0  . Insulin Lispro Prot & Lispro (HUMALOG MIX 75/25 [place]) Inject 70 Units into the skin 2 (two) times daily.    . metoprolol (LOPRESSOR) 50 MG tablet Take 1 tablet (50 mg total) by mouth 2 (two) times daily. 180 tablet 3  . omeprazole (PRILOSEC) 40 MG capsule Take 40 mg by mouth daily.    . pravastatin (PRAVACHOL) 40 MG tablet Take two tablets daily.    . Rivaroxaban (XARELTO) 15 MG TABS tablet Take 1 tablet (15 mg total) by mouth daily. 30 tablet 3  . torsemide (DEMADEX) 20 MG tablet TAKE ONE TABLET BY MOUTH TWICE DAILY AS NEEDED (Patient taking differently: 20 mg daily. ) 180 tablet 3   No current facility-administered medications for this visit.   Facility-Administered Medications Ordered in Other Visits  Medication Dose Route Frequency Provider Last Rate Last Dose  . epoetin alfa (EPOGEN,PROCRIT) injection 40,000 Units  40,000 Units Subcutaneous Once Lloyd Huger, MD        OBJECTIVE: Filed Vitals:   01/10/15 1447  BP: 104/62  Pulse: 78  Temp: 96.7 F (35.9 C)  Resp: 16     There is no weight on file to calculate BMI.    ECOG FS:1 - Symptomatic but completely ambulatory  General: Well-developed, well-nourished, no acute distress. Eyes: anicteric sclera. Lungs: Clear to auscultation bilaterally. Heart: Regular rate and rhythm. No rubs, murmurs, or gallops. Abdomen: Soft, nontender, nondistended. No organomegaly noted, normoactive bowel sounds. Musculoskeletal: No edema, cyanosis, or clubbing. Neuro: Alert, answering all questions appropriately. Cranial nerves grossly intact. Skin: No rashes or petechiae noted. Psych: Normal affect.   LAB RESULTS:  Lab Results  Component Value Date   NA 138 12/26/2014   K 3.5 12/26/2014   CL 105 12/26/2014   CO2 27 12/26/2014   GLUCOSE 84 12/26/2014   BUN 42* 12/26/2014   CREATININE 2.12* 12/26/2014    CALCIUM 8.3* 12/26/2014   PROT 6.2* 12/24/2014   ALBUMIN 2.4* 12/24/2014   AST 19 12/24/2014   ALT 12* 12/24/2014   ALKPHOS 63 12/24/2014   BILITOT 0.5 12/24/2014   GFRNONAA 28* 12/26/2014   GFRAA 32* 12/26/2014    Lab Results  Component Value Date   WBC 8.0 12/24/2014   NEUTROABS 6.6* 12/24/2014   HGB 9.8* 01/10/2015   HCT 26.0* 12/24/2014   MCV 82.9 12/24/2014   PLT 269 12/24/2014     STUDIES: Dg Chest 1 View  12/26/2014  CLINICAL DATA:  Status post left thoracentesis EXAM: CHEST  1 VIEW COMPARISON:  Film from earlier in the same day FINDINGS: Cardiac shadow is mildly enlarged. A left-sided PICC line is noted in the in the distal superior vena cava. The lungs are well  aerated. Interval thoracentesis is noted with resolution of left-sided pleural effusion. No pneumothorax is noted. No acute bony abnormality is seen. IMPRESSION: No evidence of pneumothorax following thoracentesis. There is resolution of the left-sided pleural effusion. Electronically Signed   By: Inez Catalina M.D.   On: 12/26/2014 10:57   Dg Chest 1 View  12/26/2014  CLINICAL DATA:  Acute respiratory failure and known pleural effusion EXAM: CHEST  1 VIEW COMPARISON:  12/23/2014 FINDINGS: Cardiac shadow is at the upper limits of normal in size but stable. Left-sided PICC line is noted in satisfactory position. Persistent small left pleural effusion is noted laterally. No new focal infiltrate is seen. The right lung remains clear. IMPRESSION: No change in left effusion. Stable PICC line position. Electronically Signed   By: Inez Catalina M.D.   On: 12/26/2014 08:49   US Thoracentesis Asp Pleural Space W/img Guide  12/26/2014  CLINICAL DATA:  Left pleural effusion EXAM: ULTRASOUND GUIDED left THORACENTESIS PROCEDURE: An ultrasound guided thoracentesis was thoroughly discussed with the patient and questions answered. The benefits, risks, alternatives and complications were also discussed. The patient understands and wishes  to proceed with the procedure. Written consent was obtained. Ultrasound was performed to localize and mark an adequate pocket of fluid in the left chest. The area was then prepped and draped in the normal sterile fashion. 1% Lidocaine was used for local anesthesia. Under ultrasound guidance a 6 French thoracentesis catheter was introduced. Thoracentesis was performed. The catheter was removed and a dressing applied. COMPLICATIONS: None FINDINGS: A total of approximately 1.3 L of clear yellow fluid was removed. A fluid sample was sent for laboratory analysis. IMPRESSION: Successful ultrasound guided right/left thoracentesis yielding 1.3 L of pleural fluid. Electronically Signed   By: Inez Catalina M.D.   On: 12/26/2014 10:56    ASSESSMENT: Chronic anemia secondary to renal insufficiency as well as iron deficiency.  PLAN:    1. Anemia: Patient's hemoglobin is less than 10.0 therefore he will benefit from 40,000 units of subcutaneous Procrit today. Return to clinic every 4 weeks for consideration of Procrit if his hemoglobin is below 10.0. Patient's iron stores are still mildly decreased and he will likely need IV Feraheme in the future. He did receive one infusion of IV Feraheme in mid October 2015.  Patient and his wife expressed understanding and were in agreement with this plan. 2. Chronic renal insufficiency: Patient's creatinine is approximately his baseline. Treatment per nephrology.   Patient expressed understanding and was in agreement with this plan. He also understands that He can call clinic at any time with any questions, concerns, or complaints.    Lloyd Huger, MD   01/22/2015 10:59 PM

## 2015-01-23 DIAGNOSIS — I4891 Unspecified atrial fibrillation: Secondary | ICD-10-CM | POA: Diagnosis not present

## 2015-01-23 DIAGNOSIS — Z794 Long term (current) use of insulin: Secondary | ICD-10-CM | POA: Diagnosis not present

## 2015-01-23 DIAGNOSIS — K219 Gastro-esophageal reflux disease without esophagitis: Secondary | ICD-10-CM | POA: Diagnosis not present

## 2015-01-23 DIAGNOSIS — D638 Anemia in other chronic diseases classified elsewhere: Secondary | ICD-10-CM | POA: Diagnosis not present

## 2015-01-23 DIAGNOSIS — E785 Hyperlipidemia, unspecified: Secondary | ICD-10-CM | POA: Diagnosis not present

## 2015-01-23 DIAGNOSIS — N183 Chronic kidney disease, stage 3 (moderate): Secondary | ICD-10-CM | POA: Diagnosis not present

## 2015-01-23 DIAGNOSIS — E119 Type 2 diabetes mellitus without complications: Secondary | ICD-10-CM | POA: Diagnosis not present

## 2015-01-23 DIAGNOSIS — I5043 Acute on chronic combined systolic (congestive) and diastolic (congestive) heart failure: Secondary | ICD-10-CM | POA: Diagnosis not present

## 2015-01-23 DIAGNOSIS — Z87891 Personal history of nicotine dependence: Secondary | ICD-10-CM | POA: Diagnosis not present

## 2015-01-25 ENCOUNTER — Other Ambulatory Visit: Payer: Self-pay | Admitting: Pharmacist

## 2015-01-25 NOTE — Patient Outreach (Signed)
Called to follow up with Mr and Mrs. Vanderzwaag about Mr. Murie appointment with his Cardiologist and the patient's glipizide. Again Mrs. Redhead states that the patient's Cardiologist did instruct the patient to discontinue his daily aspirin. Mrs. Sandler also states that they were instructed to hold the patient's metoprolol until his next appointment in 1 month. Reports that she continues to take his blood pressure daily. Reports that she just took his blood pressure and it is 167/80, with a pulse of 83. Reports that it was 163/85 this morning, 146/79 yesterday and 113/62 the day before. Mrs. Colla attributes the patient's recent blood pressure elevation to the patient's sister visiting and staying with them. Encourage patient's wife to continue to monitor the patient's blood pressure closely and if stays elevated, to follow up with patient's Cardiologist.  Reports that they also received the message from patient's Endocrinologst that he was to no longer be taking the glipizide and that they have now stopped this medication. Reports that she just checked the patient's blood sugar before dinner and it was 135 mg/dL.   Reports that patient has been feeling well recently, with improved appetite.  States that they have no further medication questions or concerns for me at this time. Confirmed that they have my phone number.   Let patient and his wife know that I will stop following them for now, but to please call me if they have further questions.  Harlow Asa, PharmD Clinical Pharmacist [place] [facility] [phone]

## 2015-01-26 ENCOUNTER — Other Ambulatory Visit: Payer: Commercial Managed Care - HMO

## 2015-01-26 DIAGNOSIS — D638 Anemia in other chronic diseases classified elsewhere: Secondary | ICD-10-CM | POA: Diagnosis not present

## 2015-01-26 DIAGNOSIS — Z87891 Personal history of nicotine dependence: Secondary | ICD-10-CM | POA: Diagnosis not present

## 2015-01-26 DIAGNOSIS — I4891 Unspecified atrial fibrillation: Secondary | ICD-10-CM | POA: Diagnosis not present

## 2015-01-26 DIAGNOSIS — I5043 Acute on chronic combined systolic (congestive) and diastolic (congestive) heart failure: Secondary | ICD-10-CM | POA: Diagnosis not present

## 2015-01-26 DIAGNOSIS — Z794 Long term (current) use of insulin: Secondary | ICD-10-CM | POA: Diagnosis not present

## 2015-01-26 DIAGNOSIS — E119 Type 2 diabetes mellitus without complications: Secondary | ICD-10-CM | POA: Diagnosis not present

## 2015-01-26 DIAGNOSIS — E785 Hyperlipidemia, unspecified: Secondary | ICD-10-CM | POA: Diagnosis not present

## 2015-01-26 DIAGNOSIS — N183 Chronic kidney disease, stage 3 (moderate): Secondary | ICD-10-CM | POA: Diagnosis not present

## 2015-01-26 DIAGNOSIS — K219 Gastro-esophageal reflux disease without esophagitis: Secondary | ICD-10-CM | POA: Diagnosis not present

## 2015-01-26 NOTE — Patient Outreach (Signed)
[facility]) Care Management  01/26/2015  Patrick [name] 1931/03/26 [id number]   Outreach call #2 to patient for Initial Assessment. Contact/wife states patient is not available for call.     Plan:  RN CM left HIPAA compliant voice message with name and call back #.  RN CM will schedule for next outreach call within 1 week.   Mariann Laster, RN, BSN, [facility], CCM  [employer] [facility] Coordinator [phone] Direct [phone] Cell [phone] Office [phone] Fax

## 2015-02-01 ENCOUNTER — Ambulatory Visit (INDEPENDENT_AMBULATORY_CARE_PROVIDER_SITE_OTHER): Payer: Commercial Managed Care - HMO | Admitting: Internal Medicine

## 2015-02-01 ENCOUNTER — Ambulatory Visit
Admission: RE | Admit: 2015-02-01 | Discharge: 2015-02-01 | Disposition: A | Discharge status: Home / Self Care | Payer: Commercial Managed Care - HMO | Source: Ambulatory Visit | Attending: Internal Medicine | Admitting: Internal Medicine

## 2015-02-01 ENCOUNTER — Encounter: Payer: Self-pay | Admitting: Internal Medicine

## 2015-02-01 ENCOUNTER — Ambulatory Visit: Payer: Commercial Managed Care - HMO

## 2015-02-01 VITALS — BP 128/68 | HR 100 | Ht 68.0 in | Wt 175.8 lb

## 2015-02-01 DIAGNOSIS — I482 Chronic atrial fibrillation, unspecified: Secondary | ICD-10-CM

## 2015-02-01 DIAGNOSIS — I517 Cardiomegaly: Secondary | ICD-10-CM | POA: Diagnosis not present

## 2015-02-01 DIAGNOSIS — I5022 Chronic systolic (congestive) heart failure: Secondary | ICD-10-CM | POA: Diagnosis not present

## 2015-02-01 DIAGNOSIS — J90 Pleural effusion, not elsewhere classified: Secondary | ICD-10-CM | POA: Insufficient documentation

## 2015-02-01 NOTE — Addendum Note (Signed)
Addended by: Maryanna Shape A on: 02/01/2015 11:09 AM   Modules accepted: Orders

## 2015-02-01 NOTE — Progress Notes (Addendum)
* [facility]     Assessment and Plan:  Left-sided pleural effusion -The patient had a predominantly lymphocytic left-sided pleural effusion which was drained 1.3 L 12/26/2014. All testing on this fluid was negative, the fluid met criteria for exudative effusion. However, he was being diuresis. At that time. Therefore, this may have been a transudative effusion. -We will recheck chest x-ray. The pleural effusion recurs will consider repeating thoracentesis versus continued monitoring. -We'll recheck chest x-ray in 6 months and follow-up at that time.  Systolic congestive heart failure. -Ejection fraction of 45-50%.  Atrial fibrillation.  Chronic kidney disease.  Essential hypertension.  Addendum, 02/06/2015: Chest x-ray  from 02/01/2015 was reviewed. This showed small left basal pleural effusion, would not appear to be amenable for thoracentesis at this time, we'll continue to monitor with repeat chest x-ray in 6 months as planned. If the patient should develop increasing dyspnea before that time can check a chest x-ray when necessary.    Date: 02/01/2015  MRN# [id number] Patrick [name] 08/10/1931   Patrick [name] is a 81 y.o. old male seen in follow up for chief complaint of  No chief complaint on file.    HPI:  The patient is a 81 year old male who was seen in the hospital with congestive heart failure and atypical left-sided pleural effusion. Patient subsequently underwent a thoracentesis with removal of 1.3 L which was diagnostic and therapeutic in nature, and asked to follow up for results. He also has a history of diastolic congestive heart failure as well as some systolic dysfunction with an ejection fraction of 45-50%. He has chronic atrial fibrillation and essential hypertension.  Today his family was present with him, says that he is feeling and doing well. The patient has no particular complaints. He likely of dyspnea. They say that he  has been ambulatory without problems with breathing.  The patient was ambulated approximately 300 feet today. Beginning O2 sat was 95% with a heart rate of 85. He had a short shuffling gait, minimal dyspnea after the tests, his oxygen saturation was 92% with a heart rate of 118.  Review of testing. -Echocardiogram 12/23/2014 which showed ejection fraction of 45 to 50%. -CT chest* 12/22/2014 showed a moderate left-sided pleural effusion with compressive atelectasis. Also left chronic vertebral artery occlusion. -ABG 12/23/2014 showed a pH of 7.46 with a PCO2 of 39, bicarbonate 27.7, a PO2 of 56 -Review of the thoracentesis performed on 12/26/2014 showed a pleural fluid LDH of 123 and protein less than 3. Serum protein was 6.2. Cytology and flow time cytometry were negative, the fluid was predominantly lymphocytic, total of 1.3 L was removed.  No flowsheet data found.  Pulmonary Functions Testing Results:  No results found for: FEV1, FVC, FEV1FVC, TLC, DLCO   Medication:   Outpatient Encounter Prescriptions as of 02/01/2015  Medication Sig  . aspirin 81 MG tablet Take 81 mg by mouth daily.  . cloNIDine (CATAPRES) 0.1 MG tablet Take 1 tablet (0.1 mg total) by mouth 2 (two) times daily.  Marland Kitchen docusate sodium (COLACE) 100 MG capsule Take 100 mg by mouth 2 (two) times daily.  . enalapril (VASOTEC) 5 MG tablet Take 5 mg by mouth daily.   . Ferrous Sulfate (IRON) 325 (65 FE) MG TABS Take 1 tablet by mouth daily.  Marland Kitchen glipiZIDE (GLUCOTROL XL) 10 MG 24 hr tablet Take 10 mg by mouth 2 (two) times daily.  . hydrALAZINE (APRESOLINE) 100 MG tablet TAKE ONE TABLET BY MOUTH THREE TIMES DAILY  .  Insulin Lispro Prot & Lispro (HUMALOG MIX 75/25 [place]) Inject 70 Units into the skin 2 (two) times daily.  . metoprolol (LOPRESSOR) 50 MG tablet Take 1 tablet (50 mg total) by mouth 2 (two) times daily.  Marland Kitchen omeprazole (PRILOSEC) 40 MG capsule Take 40 mg by mouth daily.  . pravastatin (PRAVACHOL) 40 MG tablet Take two  tablets daily.  . Rivaroxaban (XARELTO) 15 MG TABS tablet Take 1 tablet (15 mg total) by mouth daily.  Marland Kitchen torsemide (DEMADEX) 20 MG tablet TAKE ONE TABLET BY MOUTH TWICE DAILY AS NEEDED (Patient taking differently: 20 mg daily. )   Facility-Administered Encounter Medications as of 02/01/2015  Medication  . epoetin alfa (EPOGEN,PROCRIT) injection 40,000 Units     Allergies:  No known allergies  Review of Systems: Gen:  Denies  fever, sweats. HEENT: Denies blurred vision. Cvc:  No dizziness, chest pain or heaviness Resp:   Denies cough or sputum porduction. Gi: Denies swallowing difficulty, stomach pain. constipation, bowel incontinence Gu:  Denies bladder incontinence, burning urine Ext:   No Joint pain, stiffness. Skin: No skin rash, easy bruising. Endoc:  No polyuria, polydipsia. Psych: No depression, insomnia. Other:  All other systems were reviewed and found to be negative other than what is mentioned in the HPI.   Physical Examination:   VS: There were no vitals taken for this visit.  General Appearance: No distress  Neuro:without focal findings,  speech normal,  HEENT: PERRLA, EOM intact. Pulmonary: normal breath sounds, No wheezing.   CardiovascularNormal S1,S2.  No m/r/g.   Abdomen: Benign, Soft, non-tender. Renal:  No costovertebral tenderness  GU:  Not performed at this time. Endoc: No evident thyromegaly, no signs of acromegaly. Skin:   warm, no rash. Extremities: normal, no cyanosis, clubbing.   LABORATORY PANEL:   CBC No results for input(s): WBC, HGB, HCT, PLT in the last 168 hours. ------------------------------------------------------------------------------------------------------------------  Chemistries  No results for input(s): NA, K, CL, CO2, GLUCOSE, BUN, CREATININE, CALCIUM, MG, AST, ALT, ALKPHOS, BILITOT in the last 168 hours.  Invalid input(s):  GFRCGP ------------------------------------------------------------------------------------------------------------------  Cardiac Enzymes No results for input(s): TROPONINI in the last 168 hours. ------------------------------------------------------------  RADIOLOGY:   No results found for this or any previous visit. Results for orders placed in visit on 12/28/12  DG Chest 2 View   ------------------------------------------------------------------------------------------------------------------  Thank  you for allowing [facility] to assist in the care of your patient. Our recommendations are noted above.  Please contact us if we can be of further service.   Marda Stalker, MD.  [facility] Office Number: [phone]  Patricia Pesa, M.D.  Vilinda Boehringer, M.D.  Merton Border, M.D

## 2015-02-01 NOTE — Patient Instructions (Addendum)
-  We'll check a two-view chest x-ray. -Call earlier than next scheduled appointment if you're having increased trouble breathing or increasing chest pain on the left side. --Repeat CXR in 6 months and follow up at that time.

## 2015-02-06 ENCOUNTER — Other Ambulatory Visit: Payer: Commercial Managed Care - HMO

## 2015-02-06 NOTE — Patient Outreach (Signed)
[facility]) Care Management  02/06/2015  QUESHAWN SIMCIK 12-20-1931 [id number]   Outreach call #2 to patient for Initial Assessment. Contact/wife states patient is not available for call; currently outside.    Plan:  HF diagnosis verification:  Per EPIC MR note 02/01/15:  Laverle Hobby, MD notes patient has "history of diastolic congestive heart failure as well as some systolic dysfunction with an ejection fraction of 45-50%. He has chronic atrial fibrillation and essential hypertension." RN CM left HIPAA compliant voice message with name and call back #.  RN CM sent unsuccessful outreach letter to patient.  RN CM will review case in 2 weeks and close if no response received.    Mariann Laster, RN, BSN, [facility], CCM  [employer] Management Coordinator [phone] Direct [phone] Cell [phone] Office [phone] Fax

## 2015-02-07 ENCOUNTER — Inpatient Hospital Stay: Payer: Commercial Managed Care - HMO | Attending: Oncology

## 2015-02-07 ENCOUNTER — Inpatient Hospital Stay: Payer: Commercial Managed Care - HMO

## 2015-02-07 VITALS — BP 149/82 | HR 96 | Resp 18

## 2015-02-07 DIAGNOSIS — Z79899 Other long term (current) drug therapy: Secondary | ICD-10-CM | POA: Insufficient documentation

## 2015-02-07 DIAGNOSIS — N183 Chronic kidney disease, stage 3 (moderate): Secondary | ICD-10-CM

## 2015-02-07 DIAGNOSIS — D631 Anemia in chronic kidney disease: Secondary | ICD-10-CM | POA: Insufficient documentation

## 2015-02-07 DIAGNOSIS — N189 Chronic kidney disease, unspecified: Secondary | ICD-10-CM

## 2015-02-07 DIAGNOSIS — D509 Iron deficiency anemia, unspecified: Secondary | ICD-10-CM | POA: Insufficient documentation

## 2015-02-07 DIAGNOSIS — D638 Anemia in other chronic diseases classified elsewhere: Secondary | ICD-10-CM

## 2015-02-07 LAB — CBC WITH DIFFERENTIAL/PLATELET
BASOS ABS: 0.1 10*3/uL (ref 0–0.1)
BASOS PCT: 2 %
Eosinophils Absolute: 0.1 10*3/uL (ref 0–0.7)
Eosinophils Relative: 3 %
HEMATOCRIT: 25.7 % — AB (ref 40.0–52.0)
HEMOGLOBIN: 8.6 g/dL — AB (ref 13.0–18.0)
Lymphocytes Relative: 20 %
Lymphs Abs: 0.7 10*3/uL — ABNORMAL LOW (ref 1.0–3.6)
MCH: 28.3 pg (ref 26.0–34.0)
MCHC: 33.6 g/dL (ref 32.0–36.0)
MCV: 84.2 fL (ref 80.0–100.0)
Monocytes Absolute: 0.2 10*3/uL (ref 0.2–1.0)
Monocytes Relative: 5 %
NEUTROS ABS: 2.4 10*3/uL (ref 1.4–6.5)
NEUTROS PCT: 70 %
Platelets: 385 10*3/uL (ref 150–440)
RBC: 3.06 MIL/uL — ABNORMAL LOW (ref 4.40–5.90)
RDW: 18.7 % — AB (ref 11.5–14.5)
WBC: 3.4 10*3/uL — AB (ref 3.8–10.6)

## 2015-02-07 LAB — IRON AND TIBC
IRON: 38 ug/dL — AB (ref 45–182)
Saturation Ratios: 12 % — ABNORMAL LOW (ref 17.9–39.5)
TIBC: 311 ug/dL (ref 250–450)
UIBC: 273 ug/dL

## 2015-02-07 LAB — FERRITIN: Ferritin: 100 ng/mL (ref 24–336)

## 2015-02-07 MED ORDER — EPOETIN ALFA 40000 UNIT/ML IJ SOLN
40000.0000 [IU] | Freq: Once | INTRAMUSCULAR | Status: AC
  Administered 2015-02-07: 40000 [IU] via SUBCUTANEOUS
  Filled 2015-02-07: qty 1

## 2015-02-08 DIAGNOSIS — D638 Anemia in other chronic diseases classified elsewhere: Secondary | ICD-10-CM | POA: Diagnosis not present

## 2015-02-08 DIAGNOSIS — Z794 Long term (current) use of insulin: Secondary | ICD-10-CM | POA: Diagnosis not present

## 2015-02-08 DIAGNOSIS — E119 Type 2 diabetes mellitus without complications: Secondary | ICD-10-CM | POA: Diagnosis not present

## 2015-02-08 DIAGNOSIS — I5043 Acute on chronic combined systolic (congestive) and diastolic (congestive) heart failure: Secondary | ICD-10-CM | POA: Diagnosis not present

## 2015-02-08 DIAGNOSIS — K219 Gastro-esophageal reflux disease without esophagitis: Secondary | ICD-10-CM | POA: Diagnosis not present

## 2015-02-08 DIAGNOSIS — Z87891 Personal history of nicotine dependence: Secondary | ICD-10-CM | POA: Diagnosis not present

## 2015-02-08 DIAGNOSIS — N183 Chronic kidney disease, stage 3 (moderate): Secondary | ICD-10-CM | POA: Diagnosis not present

## 2015-02-08 DIAGNOSIS — E785 Hyperlipidemia, unspecified: Secondary | ICD-10-CM | POA: Diagnosis not present

## 2015-02-08 DIAGNOSIS — I4891 Unspecified atrial fibrillation: Secondary | ICD-10-CM | POA: Diagnosis not present

## 2015-02-15 ENCOUNTER — Telehealth: Payer: Self-pay | Admitting: *Deleted

## 2015-02-15 NOTE — Telephone Encounter (Signed)
Patient called wanting results on lung biopsy.

## 2015-02-20 ENCOUNTER — Other Ambulatory Visit: Payer: Self-pay

## 2015-02-20 DIAGNOSIS — I5042 Chronic combined systolic (congestive) and diastolic (congestive) heart failure: Secondary | ICD-10-CM | POA: Insufficient documentation

## 2015-02-20 NOTE — Patient Outreach (Addendum)
[facility]) Care Management  02/20/2015  Patrick [name] 1931/12/20 [id number]   Referral Date: 12/27/14 Referral Source: [facility]  Referral Issue: Patient was discharged from [facility] on 12/26/14 for HF, DM and Renal failure. Patient also needs med management. (Referral saved under Media tab).   Outreach call #4 to patient. Patient reached for Initial Assessment.   Providers:  PCP: Dr. Maryland Pink - patient states last appt was last week 12/2014. Pulmonologist:  Laverle Hobby, MD - last appt 02/01/15 HH: [place]: [employer] Cecilia [id number]  Social:  Patient lives in his home with his wife. Ambulates with walker. Home alone during the day while wife works at school; wife gets home after 3pm.  Patient states wife manages all his care and gives permission to discuss any PHI or calls with wife as needed.  Pain: No  Caregiver: Wife, Dimarco Holstad.  Transportation: Self DME: Gilford Rile, Glucometer, BP cuff, scales  Admissions:(1)  12/22/14 - 12/26/14  CHF; acute respiratory failure secondary to pulmonary edema from congestive heart failure, and volume overload ED: (1)  H/o DM,  HF, CKD, HTN, A-Fib Patient stated he did not have HF on previous contact but MD clarified on 02/01/2015 office visit that patient does have CHF.  Systolic congestive heart failure with Ejection fraction of 45-50%. Weight 168.5   Height 5'7" Self checks Blood sugars twice a day. States morning blood sugar was around 100's this morning.  States wife monitors his BP.   Medications:   More than 10  Wife manages medications and uses a medication box.  Co-pays: States affordable.  Pharmacy: Mail order.  RN CM instructed patient that [facility] Pharmacist is attempting to reach patient by phone and please take call.   Consent Patient gave verbal consent to continue with [facility] services and permission to discuss PHI with wife/Vicki as  needed. Written consent not received yet; states his wife manages "all that."   Plan:  RN CM notified MD via physician involvement letter and Initial Assessment via Epic route.  RN CM closed to [facility] RN CM Level 3 due to no care coordination needs identified but CHF education is needed. RN CM sent referral to Health Coach for disease management education.  RN CM instructed in next follow-up call within 30 days with Health Coach.  RN CM advised to please notify MD of any changes in condition prior to scheduled appt's.  RN CM provided contact name and # [phone] or main office # [phone] and 24-hour nurse line # [phone]. RN CM confirmed patient is aware of 911 services for urgent emergency needs.  Mariann Laster, RN, BSN, [facility], CCM  [employer] Management Coordinator [phone] Direct [phone] Cell [phone] Office [phone] Fax

## 2015-02-21 NOTE — Progress Notes (Signed)
This encounter was created in error - please disregard.

## 2015-02-23 ENCOUNTER — Other Ambulatory Visit: Payer: Self-pay | Admitting: *Deleted

## 2015-02-23 NOTE — Patient Outreach (Signed)
[facility]) Care Management  02/23/2015  Patrick [name] [birth year] [id number]  Telephone call to patient. Patient doesn't know the signs and symptoms of congestive heart failure. Per patient his blood sugar was 146 today. Patient stated he is not using a walker or cane. Per patient he has not fallen within the last 3 months. Per patient his wife keeps up with everything and could I call her. Per patient the wife comes in after 3. [place] Care Management [phone]

## 2015-02-28 ENCOUNTER — Other Ambulatory Visit: Payer: Self-pay | Admitting: *Deleted

## 2015-03-01 ENCOUNTER — Encounter: Payer: Self-pay | Admitting: *Deleted

## 2015-03-01 NOTE — Patient Outreach (Signed)
[facility]) Care Management  02/28/2015   TAIO BUSCHMAN December 16, 1931 [id number]  RN Health Coach telephone call to patient.  Hipaa compliance verified. Spoke with patient wife Jocelyn Lamer. Per wife he is walking around the house. Patient has a cane and walker. Per wife t he doesn't use very often. The patient  Hold onto his wife arm when going up the stairs. The patient has not had any falls. The patient is dependant on the wife for sorting and giving medications. Per wife she is weighing the patient, talking his blood pressure and checking his blood glucose everyday.  This patient A1C was 6.3 on 01/04/2015.  Patient and wife are not familiar with the zones and the action plans of CHF.Per wife the patient appetite is not very good. Wife is giving patient Nepro supplement. Per patient wife his weight went from 200 to 159 over several months. Patient and wife has agreed to follow up outreach calls from [employer].  Assessment Knowledge deficit in the self management of CHF This patient would benefit from [employer] outreach telephonic calls for education and support of CHF self  management  Poor appetite    Plan RN Health Coach will send EMMI on What is Heart Failure [place] will send EMMI on Keeping Track of your Weight Each Day [place] will send EMMI on When to call the Doctor or [place] will send EMMI on Heart Failure working with your Doctor [place] will send EMMI on Understanding [place] will send EMMI on How to be salt smart [place] will send a chart on foods high and low in salt [place] will follow up with patient within a month RN will send a 2017 calendar book  [place] Management [phone];

## 2015-03-02 ENCOUNTER — Encounter: Payer: Self-pay | Admitting: *Deleted

## 2015-03-06 ENCOUNTER — Other Ambulatory Visit: Payer: Self-pay | Admitting: *Deleted

## 2015-03-06 DIAGNOSIS — D638 Anemia in other chronic diseases classified elsewhere: Secondary | ICD-10-CM

## 2015-03-07 ENCOUNTER — Inpatient Hospital Stay: Payer: Commercial Managed Care - HMO

## 2015-03-08 ENCOUNTER — Inpatient Hospital Stay: Payer: Commercial Managed Care - HMO | Attending: Oncology

## 2015-03-08 DIAGNOSIS — D509 Iron deficiency anemia, unspecified: Secondary | ICD-10-CM | POA: Insufficient documentation

## 2015-03-08 DIAGNOSIS — N183 Chronic kidney disease, stage 3 (moderate): Secondary | ICD-10-CM | POA: Insufficient documentation

## 2015-03-08 DIAGNOSIS — D631 Anemia in chronic kidney disease: Secondary | ICD-10-CM | POA: Insufficient documentation

## 2015-03-08 DIAGNOSIS — D638 Anemia in other chronic diseases classified elsewhere: Secondary | ICD-10-CM

## 2015-03-08 DIAGNOSIS — Z79899 Other long term (current) drug therapy: Secondary | ICD-10-CM | POA: Insufficient documentation

## 2015-03-08 LAB — CBC WITH DIFFERENTIAL/PLATELET
BASOS PCT: 1 %
Basophils Absolute: 0 10*3/uL (ref 0–0.1)
EOS ABS: 0.1 10*3/uL (ref 0–0.7)
Eosinophils Relative: 3 %
HCT: 31.3 % — ABNORMAL LOW (ref 40.0–52.0)
HEMOGLOBIN: 10.4 g/dL — AB (ref 13.0–18.0)
LYMPHS ABS: 0.6 10*3/uL — AB (ref 1.0–3.6)
Lymphocytes Relative: 13 %
MCH: 27.9 pg (ref 26.0–34.0)
MCHC: 33.3 g/dL (ref 32.0–36.0)
MCV: 83.7 fL (ref 80.0–100.0)
Monocytes Absolute: 0.2 10*3/uL (ref 0.2–1.0)
Monocytes Relative: 4 %
NEUTROS PCT: 79 %
Neutro Abs: 3.5 10*3/uL (ref 1.4–6.5)
Platelets: 329 10*3/uL (ref 150–440)
RBC: 3.74 MIL/uL — AB (ref 4.40–5.90)
RDW: 17.8 % — ABNORMAL HIGH (ref 11.5–14.5)
WBC: 4.4 10*3/uL (ref 3.8–10.6)

## 2015-03-08 LAB — IRON AND TIBC
Iron: 37 ug/dL — ABNORMAL LOW (ref 45–182)
SATURATION RATIOS: 11 % — AB (ref 17.9–39.5)
TIBC: 335 ug/dL (ref 250–450)
UIBC: 298 ug/dL

## 2015-03-08 LAB — FERRITIN: Ferritin: 86 ng/mL (ref 24–336)

## 2015-03-14 ENCOUNTER — Telehealth: Payer: Self-pay | Admitting: Cardiovascular Disease

## 2015-03-14 NOTE — Telephone Encounter (Signed)
3 attempts to schedule fu from recall list.   LMOV to schedule with office.   Deleting recall.

## 2015-03-21 DIAGNOSIS — N184 Chronic kidney disease, stage 4 (severe): Secondary | ICD-10-CM | POA: Diagnosis not present

## 2015-03-21 DIAGNOSIS — I129 Hypertensive chronic kidney disease with stage 1 through stage 4 chronic kidney disease, or unspecified chronic kidney disease: Secondary | ICD-10-CM | POA: Diagnosis not present

## 2015-03-21 DIAGNOSIS — D631 Anemia in chronic kidney disease: Secondary | ICD-10-CM | POA: Diagnosis not present

## 2015-03-21 DIAGNOSIS — E1122 Type 2 diabetes mellitus with diabetic chronic kidney disease: Secondary | ICD-10-CM | POA: Diagnosis not present

## 2015-03-31 ENCOUNTER — Ambulatory Visit: Payer: Self-pay | Admitting: *Deleted

## 2015-04-04 ENCOUNTER — Inpatient Hospital Stay: Payer: Commercial Managed Care - HMO

## 2015-04-04 ENCOUNTER — Other Ambulatory Visit: Payer: Self-pay | Admitting: *Deleted

## 2015-04-04 NOTE — Patient Outreach (Signed)
[facility]) Care Management  [facility] Care Manager  [name]) Care Management  04/04/2015  Patrick [name] 12-27-1931 [id number]  RN Health Coach telephone call to patient.  Hipaa compliance verified. RN Health Coach spoke with wife Jocelyn Lamer. Patient weight is 168.5 this am. Wife is weighing patient each morning and documenting weight and time weighed. Patient is very dependant on wife to assist with medications and meals and documenting his weight and blood sugars. Patient does not have any swelling in his extremities. Per wife no shortness of breath noted. Per wife the patient is a little depressed because he want to go outside and shovel snow. Per wife she has explained that he can't do this. RN Health Coach explained the risk of heart attacks with the cold and his co morbidities.  Patient appetite is poor and he is supplementing his diet with nephro ,and has been approved by the renal Dr to use ensure and boost alternating. Per wife it is hard to afford. Family is helping out and the coupons provided by [facility] for the supplemental nutrition. Wife is able to verbalize the zones and the action plan for CHF. Wife did very well with teach back. Patient blood sugar is 110.  Wife and patient agree to follow up outreach calls.  Assessment Poor appetite Patient has elevated cholesterol Patient does not exercise on a regular basis Patient would continue to benefit from [employer] outreach support and educational material on CHF,Diabetes and hyperlipidemia.  Plan RN Health Coach will send EMMI information on seasonal cold, flu and pneumonia RN Health Coach will send EMMI information on Heart Failure Rest, Exercise and Blood Pressure RN Health Coach will send EMMI information on Heart Failure working with your Doctor RN Health Coach will send EMMI information on [place] will send EMMI information on  Heart Failure and Stroke [place] will send EMMI information on Diabetes preventing Heart Attack and Stroke [place] will send a chart on reading food labels [place] will send EMMI information on High Cholesterol and Understanding Cholesterol Results [place] will provide ongoing education for patient on congestive heart failure,diabetes and hyperlipidemia through phone calls and sending printed information to patient for further discussion.  [place] Care Management [phone]

## 2015-04-06 ENCOUNTER — Encounter: Payer: Self-pay | Admitting: *Deleted

## 2015-04-06 ENCOUNTER — Inpatient Hospital Stay: Payer: Commercial Managed Care - HMO | Attending: Oncology

## 2015-04-06 ENCOUNTER — Inpatient Hospital Stay: Payer: Commercial Managed Care - HMO

## 2015-04-06 DIAGNOSIS — Z79899 Other long term (current) drug therapy: Secondary | ICD-10-CM | POA: Diagnosis not present

## 2015-04-06 DIAGNOSIS — N189 Chronic kidney disease, unspecified: Secondary | ICD-10-CM

## 2015-04-06 DIAGNOSIS — N183 Chronic kidney disease, stage 3 (moderate): Secondary | ICD-10-CM | POA: Insufficient documentation

## 2015-04-06 DIAGNOSIS — D631 Anemia in chronic kidney disease: Secondary | ICD-10-CM | POA: Diagnosis not present

## 2015-04-06 LAB — HEMOGLOBIN: HEMOGLOBIN: 11 g/dL — AB (ref 13.0–18.0)

## 2015-04-12 ENCOUNTER — Ambulatory Visit: Payer: Commercial Managed Care - HMO | Admitting: Family

## 2015-04-20 DIAGNOSIS — H35033 Hypertensive retinopathy, bilateral: Secondary | ICD-10-CM | POA: Diagnosis not present

## 2015-04-20 DIAGNOSIS — I10 Essential (primary) hypertension: Secondary | ICD-10-CM | POA: Diagnosis not present

## 2015-04-20 DIAGNOSIS — Z0100 Encounter for examination of eyes and vision without abnormal findings: Secondary | ICD-10-CM | POA: Diagnosis not present

## 2015-05-01 ENCOUNTER — Other Ambulatory Visit: Payer: Self-pay | Admitting: *Deleted

## 2015-05-01 DIAGNOSIS — D638 Anemia in other chronic diseases classified elsewhere: Secondary | ICD-10-CM

## 2015-05-02 ENCOUNTER — Inpatient Hospital Stay: Payer: Commercial Managed Care - HMO

## 2015-05-02 ENCOUNTER — Inpatient Hospital Stay: Payer: Commercial Managed Care - HMO | Attending: Oncology

## 2015-05-02 ENCOUNTER — Ambulatory Visit: Payer: Commercial Managed Care - HMO | Admitting: Family

## 2015-05-02 ENCOUNTER — Inpatient Hospital Stay (HOSPITAL_BASED_OUTPATIENT_CLINIC_OR_DEPARTMENT_OTHER): Payer: Commercial Managed Care - HMO | Admitting: Oncology

## 2015-05-02 VITALS — BP 147/86 | HR 84 | Temp 97.8°F | Resp 16 | Wt 182.3 lb

## 2015-05-02 DIAGNOSIS — D638 Anemia in other chronic diseases classified elsewhere: Secondary | ICD-10-CM

## 2015-05-02 DIAGNOSIS — I129 Hypertensive chronic kidney disease with stage 1 through stage 4 chronic kidney disease, or unspecified chronic kidney disease: Secondary | ICD-10-CM

## 2015-05-02 DIAGNOSIS — J90 Pleural effusion, not elsewhere classified: Secondary | ICD-10-CM | POA: Insufficient documentation

## 2015-05-02 DIAGNOSIS — N183 Chronic kidney disease, stage 3 (moderate): Secondary | ICD-10-CM

## 2015-05-02 DIAGNOSIS — K219 Gastro-esophageal reflux disease without esophagitis: Secondary | ICD-10-CM | POA: Insufficient documentation

## 2015-05-02 DIAGNOSIS — Z79899 Other long term (current) drug therapy: Secondary | ICD-10-CM | POA: Insufficient documentation

## 2015-05-02 DIAGNOSIS — Z7901 Long term (current) use of anticoagulants: Secondary | ICD-10-CM | POA: Diagnosis not present

## 2015-05-02 DIAGNOSIS — D631 Anemia in chronic kidney disease: Secondary | ICD-10-CM

## 2015-05-02 DIAGNOSIS — Z794 Long term (current) use of insulin: Secondary | ICD-10-CM | POA: Insufficient documentation

## 2015-05-02 DIAGNOSIS — Z87891 Personal history of nicotine dependence: Secondary | ICD-10-CM | POA: Insufficient documentation

## 2015-05-02 DIAGNOSIS — Z7982 Long term (current) use of aspirin: Secondary | ICD-10-CM | POA: Diagnosis not present

## 2015-05-02 DIAGNOSIS — D509 Iron deficiency anemia, unspecified: Secondary | ICD-10-CM

## 2015-05-02 DIAGNOSIS — I482 Chronic atrial fibrillation: Secondary | ICD-10-CM | POA: Insufficient documentation

## 2015-05-02 DIAGNOSIS — E119 Type 2 diabetes mellitus without complications: Secondary | ICD-10-CM | POA: Insufficient documentation

## 2015-05-02 DIAGNOSIS — E785 Hyperlipidemia, unspecified: Secondary | ICD-10-CM | POA: Diagnosis not present

## 2015-05-02 DIAGNOSIS — I5042 Chronic combined systolic (congestive) and diastolic (congestive) heart failure: Secondary | ICD-10-CM | POA: Insufficient documentation

## 2015-05-02 LAB — CBC WITH DIFFERENTIAL/PLATELET
Basophils Absolute: 0.1 10*3/uL (ref 0–0.1)
Basophils Relative: 1 %
EOS ABS: 0.1 10*3/uL (ref 0–0.7)
Eosinophils Relative: 2 %
HEMATOCRIT: 32.7 % — AB (ref 40.0–52.0)
HEMOGLOBIN: 11.1 g/dL — AB (ref 13.0–18.0)
LYMPHS ABS: 0.6 10*3/uL — AB (ref 1.0–3.6)
LYMPHS PCT: 8 %
MCH: 28.7 pg (ref 26.0–34.0)
MCHC: 34 g/dL (ref 32.0–36.0)
MCV: 84.5 fL (ref 80.0–100.0)
MONOS PCT: 5 %
Monocytes Absolute: 0.4 10*3/uL (ref 0.2–1.0)
NEUTROS ABS: 6.4 10*3/uL (ref 1.4–6.5)
NEUTROS PCT: 84 %
Platelets: 379 10*3/uL (ref 150–440)
RBC: 3.87 MIL/uL — AB (ref 4.40–5.90)
RDW: 16.6 % — ABNORMAL HIGH (ref 11.5–14.5)
WBC: 7.7 10*3/uL (ref 3.8–10.6)

## 2015-05-02 LAB — FERRITIN: Ferritin: 73 ng/mL (ref 24–336)

## 2015-05-02 LAB — IRON AND TIBC
Iron: 49 ug/dL (ref 45–182)
Saturation Ratios: 14 % — ABNORMAL LOW (ref 17.9–39.5)
TIBC: 345 ug/dL (ref 250–450)
UIBC: 296 ug/dL

## 2015-05-02 NOTE — Progress Notes (Signed)
[place]  [phone] [phone]  ID: Patrick [name] OB: 15-Aug-1931  MR#: [id number]  [id number]  Patient Care Team: Maryland Pink, MD as PCP - General (Family Medicine) Alisa Graff, FNP as Nurse Practitioner (Family Medicine) Rise Mu, PA-C as Physician Assistant (Cardiology) Lloyd Huger, MD as Consulting Physician (Hematology) Verlin Grills, RN as [place] Management  CHIEF COMPLAINT:  Chief Complaint  Patient presents with  . Anemia    INTERVAL HISTORY: Patient returns to clinic today for further evaluation, laboratory work, and consideration of additional Procrit. He currently feels well and at his baseline. He does not complain of shortness of breath today. He states he does not feel fatigued. He has no neurologic complaints. He denies any weight loss. He denies any recent fevers or illnesses. He denies any chest pain. He has no nausea, vomiting, constipation, or diarrhea. He has no urinary complaints. Patient offers no further specific complaints.   REVIEW OF SYSTEMS:   Review of Systems  Constitutional: Negative.   Respiratory: Negative.   Cardiovascular: Negative.   Gastrointestinal: Negative.   Musculoskeletal: Negative.   Neurological: Negative.     As per HPI. Otherwise, a complete review of systems is negatve.  PAST MEDICAL HISTORY: Past Medical History  Diagnosis Date  . Hypertension   . Diabetes mellitus without complication ([place])   . Hyperlipidemia   . History of gastroesophageal reflux (GERD)   . CKD (chronic kidney disease), stage III     a. stage III-IV  . Permanent atrial fibrillation ([place])     a. Dx 12/2011, Rate-controlled, chronic Xarelto (renal dosing) - CHA2DS2VASc = 5.  . Bell's palsy   . Chronic combined systolic and diastolic CHF (congestive heart failure) ([place])     a. 11/2012 Echo: EF 60-65%, mod conc LVH, mildly dil LA/RA, mild Ao sclerosis w/o  stenosis; b. 12/2014 Echo: EF 40-45%, prob mid-apicalanteroseptal, ant, apical HK, Gr2 DD, mod dil LA, mildly dil RA (technically difficult study).  . Anemia of chronic disease     a. plan of oncology to start Procrit - received during admission 12/2013  . GERD (gastroesophageal reflux disease)   . Bell's palsy   . Amputated finger   . Pleural effusion, left     a. 12/2014 s/p thoracentesis - protein <3, LDH 123, no malignancy.  . Atypical chest pain     a. 12/2014 Neg CE - in setting of L pleural effusion.    PAST SURGICAL HISTORY: Past Surgical History  Procedure Laterality Date  . Finger surgery      right hand  . Colonoscopy  09/2011  . Esophagogastroduodenoscopy    . Appendectomy      FAMILY HISTORY Family History  Problem Relation Age of Onset  . Heart attack Brother   . Hypertension    . Diabetes Mother   . Diabetes Sister        ADVANCED DIRECTIVES:    HEALTH MAINTENANCE: Social History  Substance Use Topics  . Smoking status: Former Smoker -- 0.25 packs/day for 10 years    Types: Cigars    Quit date: 05/06/1966  . Smokeless tobacco: Never Used  . Alcohol Use: No     Colonoscopy:  PAP:  Bone density:  Lipid panel:  Allergies  Allergen Reactions  . No Known Allergies     Current Outpatient Prescriptions  Medication Sig Dispense Refill  . aspirin 81 MG tablet Take 81 mg by mouth daily.    Marland Kitchen  cloNIDine (CATAPRES) 0.1 MG tablet Take 1 tablet (0.1 mg total) by mouth 2 (two) times daily. 60 tablet 11  . docusate sodium (COLACE) 100 MG capsule Take 100 mg by mouth 2 (two) times daily.    . enalapril (VASOTEC) 5 MG tablet Take 5 mg by mouth daily.     . Ferrous Sulfate (IRON) 325 (65 FE) MG TABS Take 1 tablet by mouth daily.    Marland Kitchen glipiZIDE (GLUCOTROL XL) 10 MG 24 hr tablet Take 10 mg by mouth 2 (two) times daily.    . hydrALAZINE (APRESOLINE) 100 MG tablet TAKE ONE TABLET BY MOUTH THREE TIMES DAILY 270 tablet 0  . Insulin Lispro Prot & Lispro (HUMALOG MIX  75/25 [place]) Inject 70 Units into the skin 2 (two) times daily.    . metoprolol (LOPRESSOR) 50 MG tablet Take 1 tablet (50 mg total) by mouth 2 (two) times daily. 180 tablet 3  . omeprazole (PRILOSEC) 40 MG capsule Take 40 mg by mouth daily.    . pravastatin (PRAVACHOL) 40 MG tablet Take two tablets daily.    . Rivaroxaban (XARELTO) 15 MG TABS tablet Take 1 tablet (15 mg total) by mouth daily. 30 tablet 3  . torsemide (DEMADEX) 20 MG tablet TAKE ONE TABLET BY MOUTH TWICE DAILY AS NEEDED (Patient taking differently: 20 mg daily. ) 180 tablet 3   No current facility-administered medications for this visit.   Facility-Administered Medications Ordered in Other Visits  Medication Dose Route Frequency Provider Last Rate Last Dose  . epoetin alfa (EPOGEN,PROCRIT) injection 40,000 Units  40,000 Units Subcutaneous Once Lloyd Huger, MD        OBJECTIVE: Filed Vitals:   05/02/15 1336  BP: 147/86  Pulse: 84  Temp: 97.8 F (36.6 C)  Resp: 16     Body mass index is 28.55 kg/(m^2).    ECOG FS:1 - Symptomatic but completely ambulatory  General: Well-developed, well-nourished, no acute distress. Eyes: anicteric sclera. Lungs: Clear to auscultation bilaterally. Heart: Regular rate and rhythm. No rubs, murmurs, or gallops. Abdomen: Soft, nontender, nondistended. No organomegaly noted, normoactive bowel sounds. Musculoskeletal: No edema, cyanosis, or clubbing. Neuro: Alert, answering all questions appropriately. Cranial nerves grossly intact. Skin: No rashes or petechiae noted. Psych: Normal affect.   LAB RESULTS:  Lab Results  Component Value Date   NA 138 12/26/2014   K 3.5 12/26/2014   CL 105 12/26/2014   CO2 27 12/26/2014   GLUCOSE 84 12/26/2014   BUN 42* 12/26/2014   CREATININE 2.12* 12/26/2014   CALCIUM 8.3* 12/26/2014   PROT 6.2* 12/24/2014   ALBUMIN 2.4* 12/24/2014   AST 19 12/24/2014   ALT 12* 12/24/2014   ALKPHOS 63 12/24/2014   BILITOT 0.5 12/24/2014   GFRNONAA 28*  12/26/2014   GFRAA 32* 12/26/2014    Lab Results  Component Value Date   WBC 7.7 05/02/2015   NEUTROABS 6.4 05/02/2015   HGB 11.1* 05/02/2015   HCT 32.7* 05/02/2015   MCV 84.5 05/02/2015   PLT 379 05/02/2015     STUDIES: No results found.  ASSESSMENT: Chronic anemia secondary to renal insufficiency as well as iron deficiency.  PLAN:    1. Anemia: Patient's hemoglobin is 11.1, therefore he will not need Procrit today. Return to clinic in 3 months for consideration of Procrit if his hemoglobin is below 10.0. His iron stores are improved.  He did receive one infusion of IV Feraheme in mid October 2015. 2. Chronic renal insufficiency: Patient's creatinine is approximately his baseline. Treatment  per nephrology.   Patient expressed understanding and was in agreement with this plan. He also understands that He can call clinic at any time with any questions, concerns, or complaints.    Mayra Reel, NP   05/02/2015 3:57 PM

## 2015-05-02 NOTE — Progress Notes (Signed)
Patient does not offer any problems today.  

## 2015-05-08 ENCOUNTER — Other Ambulatory Visit: Payer: Self-pay | Admitting: *Deleted

## 2015-05-08 NOTE — Patient Outreach (Signed)
[place] [facility]) Care Management  [name]  05/08/2015   Patrick [name] 1931-06-21 [id number]  Subjective: RN Health Coach telephone call to patient.  Hipaa compliance verified. Rn spoke with patient and wife. The patient is starting to exercise. He is walking in the evening 1/2 mile a day. Per wife his appetite is fair. Some days he says the food tastes like cardboard. Per wife he does like applesauce.  The wife is taking his blood pressure each day and doing daily weight and blood sugar. Per wife the patient went to the oncologist and didn't require any procrit. Per wife the patient is anemic and she requested additional information on anemia.  Objective:   Current Medications:  Current Outpatient Prescriptions  Medication Sig Dispense Refill  . aspirin 81 MG tablet Take 81 mg by mouth daily.    . cloNIDine (CATAPRES) 0.1 MG tablet Take 1 tablet (0.1 mg total) by mouth 2 (two) times daily. 60 tablet 11  . docusate sodium (COLACE) 100 MG capsule Take 100 mg by mouth 2 (two) times daily.    . enalapril (VASOTEC) 5 MG tablet Take 5 mg by mouth daily.     . Ferrous Sulfate (IRON) 325 (65 FE) MG TABS Take 1 tablet by mouth daily.    Marland Kitchen glipiZIDE (GLUCOTROL XL) 10 MG 24 hr tablet Take 10 mg by mouth 2 (two) times daily.    . hydrALAZINE (APRESOLINE) 100 MG tablet TAKE ONE TABLET BY MOUTH THREE TIMES DAILY 270 tablet 0  . Insulin Lispro Prot & Lispro (HUMALOG MIX 75/25 [place]) Inject 70 Units into the skin 2 (two) times daily.    . metoprolol (LOPRESSOR) 50 MG tablet Take 1 tablet (50 mg total) by mouth 2 (two) times daily. 180 tablet 3  . omeprazole (PRILOSEC) 40 MG capsule Take 40 mg by mouth daily.    . pravastatin (PRAVACHOL) 40 MG tablet Take two tablets daily.    . Rivaroxaban (XARELTO) 15 MG TABS tablet Take 1 tablet (15 mg total) by mouth daily. 30 tablet 3  . torsemide (DEMADEX) 20 MG tablet TAKE ONE TABLET BY MOUTH TWICE DAILY AS NEEDED (Patient taking  differently: 20 mg daily. ) 180 tablet 3   No current facility-administered medications for this visit.   Facility-Administered Medications Ordered in Other Visits  Medication Dose Route Frequency Provider Last Rate Last Dose  . epoetin alfa (EPOGEN,PROCRIT) injection 40,000 Units  40,000 Units Subcutaneous Once Lloyd Huger, MD        Functional Status:  In your present state of health, do you have any difficulty performing the following activities: 05/08/2015 04/04/2015  Hearing? N N  Vision? N N  Difficulty concentrating or making decisions? N Y  Walking or climbing stairs? N Y  Dressing or bathing? N N  Doing errands, shopping? Tempie Donning  Preparing Food and eating ? Y Y  Using the Toilet? - N  In the past six months, have you accidently leaked urine? N N  Do you have problems with loss of bowel control? N N  Managing your Medications? N Y  Managing your Finances? Tempie Donning  Housekeeping or managing your Housekeeping? Tempie Donning    Fall/Depression Screening: PHQ 2/9 Scores 05/08/2015 04/04/2015 02/28/2015 02/20/2015 01/10/2015 10/18/2014  PHQ - 2 Score 1 1 0 0 0 0   THN CM Care Plan Problem One        Most Recent Value   Role Documenting the Problem One  Health Coach  Care Plan for Problem One  Active   THN CM Short Term Goal #3 Start Date  -- [not met/ Per wife several days patient states that thefood has no taste. He likes applesauce and eats that alot]   THN CM Short Term Goal #4 Start Date  -- [noy met/ rn will go over results again with wife and use teach back]   THN CM Short Term Goal #5 (0-30 days)  Patient will increase exercise within the next 30 days.   THN CM Short Term Goal #5 Start Date  05/08/15   Interventions for Short Term Goal #5  RN discussed with patient about increasing exercises. RN sent educational information about the importance of exercise.     Assessment: The patient will continue to benefit from Health Coach telephonic outreach for education and support for  congestive heart failure self management.  Plan: RN will send [facility] information as wife requested on chronic anemia RN will send [facility] information Healthy Grocery Shopping RN will send [facility] information Diabetes when you are sick RN will send [facility] information diabetes taking care of yourself throughout the year RN will follow up with patient and wife within a month for follow up outreach.  [place] [facility] [phone]

## 2015-05-09 ENCOUNTER — Ambulatory Visit: Payer: Commercial Managed Care - HMO | Admitting: *Deleted

## 2015-05-09 ENCOUNTER — Encounter: Payer: Self-pay | Admitting: *Deleted

## 2015-05-09 NOTE — Patient Outreach (Signed)
Erroneous encounter

## 2015-05-25 DIAGNOSIS — L259 Unspecified contact dermatitis, unspecified cause: Secondary | ICD-10-CM | POA: Diagnosis not present

## 2015-06-12 DIAGNOSIS — N183 Chronic kidney disease, stage 3 (moderate): Secondary | ICD-10-CM | POA: Diagnosis not present

## 2015-06-12 DIAGNOSIS — D631 Anemia in chronic kidney disease: Secondary | ICD-10-CM | POA: Diagnosis not present

## 2015-06-12 DIAGNOSIS — I129 Hypertensive chronic kidney disease with stage 1 through stage 4 chronic kidney disease, or unspecified chronic kidney disease: Secondary | ICD-10-CM | POA: Diagnosis not present

## 2015-06-12 DIAGNOSIS — R809 Proteinuria, unspecified: Secondary | ICD-10-CM | POA: Diagnosis not present

## 2015-06-12 DIAGNOSIS — E1122 Type 2 diabetes mellitus with diabetic chronic kidney disease: Secondary | ICD-10-CM | POA: Diagnosis not present

## 2015-06-12 DIAGNOSIS — N2581 Secondary hyperparathyroidism of renal origin: Secondary | ICD-10-CM | POA: Diagnosis not present

## 2015-06-14 DIAGNOSIS — Z794 Long term (current) use of insulin: Secondary | ICD-10-CM | POA: Diagnosis not present

## 2015-06-14 DIAGNOSIS — E1122 Type 2 diabetes mellitus with diabetic chronic kidney disease: Secondary | ICD-10-CM | POA: Diagnosis not present

## 2015-06-14 DIAGNOSIS — N184 Chronic kidney disease, stage 4 (severe): Secondary | ICD-10-CM | POA: Diagnosis not present

## 2015-06-20 DIAGNOSIS — Z794 Long term (current) use of insulin: Secondary | ICD-10-CM | POA: Diagnosis not present

## 2015-06-20 DIAGNOSIS — N183 Chronic kidney disease, stage 3 (moderate): Secondary | ICD-10-CM | POA: Diagnosis not present

## 2015-06-20 DIAGNOSIS — E1122 Type 2 diabetes mellitus with diabetic chronic kidney disease: Secondary | ICD-10-CM | POA: Diagnosis not present

## 2015-06-21 ENCOUNTER — Ambulatory Visit: Payer: Commercial Managed Care - HMO | Attending: Family | Admitting: Family

## 2015-06-21 VITALS — BP 149/81 | HR 84 | Resp 20 | Ht 68.0 in | Wt 183.0 lb

## 2015-06-21 DIAGNOSIS — E1122 Type 2 diabetes mellitus with diabetic chronic kidney disease: Secondary | ICD-10-CM | POA: Diagnosis not present

## 2015-06-21 DIAGNOSIS — I129 Hypertensive chronic kidney disease with stage 1 through stage 4 chronic kidney disease, or unspecified chronic kidney disease: Secondary | ICD-10-CM | POA: Diagnosis not present

## 2015-06-21 DIAGNOSIS — I482 Chronic atrial fibrillation: Secondary | ICD-10-CM | POA: Insufficient documentation

## 2015-06-21 DIAGNOSIS — Z7901 Long term (current) use of anticoagulants: Secondary | ICD-10-CM | POA: Insufficient documentation

## 2015-06-21 DIAGNOSIS — E785 Hyperlipidemia, unspecified: Secondary | ICD-10-CM | POA: Diagnosis not present

## 2015-06-21 DIAGNOSIS — Z89029 Acquired absence of unspecified finger(s): Secondary | ICD-10-CM | POA: Insufficient documentation

## 2015-06-21 DIAGNOSIS — Z79899 Other long term (current) drug therapy: Secondary | ICD-10-CM | POA: Insufficient documentation

## 2015-06-21 DIAGNOSIS — Z833 Family history of diabetes mellitus: Secondary | ICD-10-CM | POA: Diagnosis not present

## 2015-06-21 DIAGNOSIS — J90 Pleural effusion, not elsewhere classified: Secondary | ICD-10-CM | POA: Diagnosis not present

## 2015-06-21 DIAGNOSIS — N183 Chronic kidney disease, stage 3 unspecified: Secondary | ICD-10-CM

## 2015-06-21 DIAGNOSIS — Z794 Long term (current) use of insulin: Secondary | ICD-10-CM | POA: Diagnosis not present

## 2015-06-21 DIAGNOSIS — Z9889 Other specified postprocedural states: Secondary | ICD-10-CM | POA: Diagnosis not present

## 2015-06-21 DIAGNOSIS — Z8249 Family history of ischemic heart disease and other diseases of the circulatory system: Secondary | ICD-10-CM | POA: Diagnosis not present

## 2015-06-21 DIAGNOSIS — Z7982 Long term (current) use of aspirin: Secondary | ICD-10-CM | POA: Diagnosis not present

## 2015-06-21 DIAGNOSIS — D649 Anemia, unspecified: Secondary | ICD-10-CM | POA: Diagnosis not present

## 2015-06-21 DIAGNOSIS — G510 Bell's palsy: Secondary | ICD-10-CM | POA: Diagnosis not present

## 2015-06-21 DIAGNOSIS — K219 Gastro-esophageal reflux disease without esophagitis: Secondary | ICD-10-CM | POA: Diagnosis not present

## 2015-06-21 DIAGNOSIS — I5032 Chronic diastolic (congestive) heart failure: Secondary | ICD-10-CM | POA: Diagnosis not present

## 2015-06-21 DIAGNOSIS — Z87891 Personal history of nicotine dependence: Secondary | ICD-10-CM | POA: Insufficient documentation

## 2015-06-21 DIAGNOSIS — I110 Hypertensive heart disease with heart failure: Secondary | ICD-10-CM | POA: Diagnosis not present

## 2015-06-21 DIAGNOSIS — I10 Essential (primary) hypertension: Secondary | ICD-10-CM

## 2015-06-21 DIAGNOSIS — E1159 Type 2 diabetes mellitus with other circulatory complications: Secondary | ICD-10-CM

## 2015-06-21 NOTE — Progress Notes (Signed)
Subjective:    Patient ID: Patrick [name], male    DOB: 09-Feb-1932, 82 y.o.   MRN: [id number]  Congestive Heart Failure Presents for follow-up visit. The disease course has been stable. Associated symptoms include fatigue. Pertinent negatives include no abdominal pain, chest pain, edema, orthopnea, palpitations or shortness of breath. The symptoms have been stable. Past treatments include ACE inhibitors, beta blockers and salt and fluid restriction. The treatment provided moderate relief. Compliance with prior treatments has been good. His past medical history is significant for anemia, arrhythmia, DM and HTN. There is no history of CVA.  Hypertension This is a chronic problem. The current episode started more than 1 year ago. The problem is unchanged. The problem is controlled. Pertinent negatives include no chest pain, headaches, neck pain, palpitations, peripheral edema or shortness of breath. There are no associated agents to hypertension. Risk factors for coronary artery disease include diabetes mellitus, dyslipidemia, family history and male gender. Past treatments include beta blockers, ACE inhibitors, diuretics and lifestyle changes. The current treatment provides moderate improvement. Compliance problems include exercise.  Hypertensive end-organ damage includes kidney disease and heart failure.   Past Medical History  Diagnosis Date  . Hypertension   . Diabetes mellitus without complication ([place])   . Hyperlipidemia   . History of gastroesophageal reflux (GERD)   . CKD (chronic kidney disease), stage III     a. stage III-IV  . Permanent atrial fibrillation ([place])     a. Dx 12/2011, Rate-controlled, chronic Xarelto (renal dosing) - CHA2DS2VASc = 5.  . Bell's palsy   . Chronic combined systolic and diastolic CHF (congestive heart failure) ([place])     a. 11/2012 Echo: EF 60-65%, mod conc LVH, mildly dil LA/RA, mild Ao sclerosis w/o stenosis; b. 12/2014 Echo: EF 40-45%, prob  mid-apicalanteroseptal, ant, apical HK, Gr2 DD, mod dil LA, mildly dil RA (technically difficult study).  . Anemia of chronic disease     a. plan of oncology to start Procrit - received during admission 12/2013  . GERD (gastroesophageal reflux disease)   . Bell's palsy   . Amputated finger   . Pleural effusion, left     a. 12/2014 s/p thoracentesis - protein <3, LDH 123, no malignancy.  . Atypical chest pain     a. 12/2014 Neg CE - in setting of L pleural effusion.    Past Surgical History  Procedure Laterality Date  . Finger surgery      right hand  . Colonoscopy  09/2011  . Esophagogastroduodenoscopy    . Appendectomy      Family History  Problem Relation Age of Onset  . Heart attack Brother   . Hypertension    . Diabetes Mother   . Diabetes Sister     Social History  Substance Use Topics  . Smoking status: Former Smoker -- 0.25 packs/day for 10 years    Types: Cigars    Quit date: 05/06/1966  . Smokeless tobacco: Never Used  . Alcohol Use: No    Allergies  Allergen Reactions  . No Known Allergies     Prior to Admission medications   Medication Sig Start Date End Date Taking? Authorizing Provider  aspirin 81 MG tablet Take 81 mg by mouth daily.   Yes Historical Provider, MD  cloNIDine (CATAPRES) 0.1 MG tablet Take 1 tablet (0.1 mg total) by mouth 2 (two) times daily. 05/10/13  Yes Minna Merritts, MD  enalapril (VASOTEC) 5 MG tablet Take 5 mg by mouth daily.  07/16/13  Yes Historical Provider, MD  glipiZIDE (GLUCOTROL XL) 10 MG 24 hr tablet Take 10 mg by mouth 2 (two) times daily.   Yes Historical Provider, MD  hydrALAZINE (APRESOLINE) 100 MG tablet TAKE ONE TABLET BY MOUTH THREE TIMES DAILY 07/19/13  Yes Minna Merritts, MD  hydrOXYzine (ATARAX/VISTARIL) 10 MG tablet Take 10 mg by mouth at bedtime as needed for itching.   Yes Historical Provider, MD  Insulin Lispro Prot & Lispro (HUMALOG MIX 75/25 [place]) Inject 75 Units into the skin 2 (two) times daily.    Yes  Historical Provider, MD  metoprolol (LOPRESSOR) 50 MG tablet Take 1 tablet (50 mg total) by mouth 2 (two) times daily. 02/08/14  Yes Minna Merritts, MD  omeprazole (PRILOSEC) 40 MG capsule Take 40 mg by mouth daily.   Yes Historical Provider, MD  pravastatin (PRAVACHOL) 40 MG tablet Take two tablets daily.   Yes Historical Provider, MD  Rivaroxaban (XARELTO) 15 MG TABS tablet Take 1 tablet (15 mg total) by mouth daily. 05/05/12  Yes Minna Merritts, MD  triamcinolone cream (KENALOG) 0.1 % Apply 1 application topically 2 (two) times daily.   Yes Historical Provider, MD  docusate sodium (COLACE) 100 MG capsule Take 100 mg by mouth 2 (two) times daily. Reported on 06/21/2015    Historical Provider, MD  Ferrous Sulfate (IRON) 325 (65 FE) MG TABS Take 1 tablet by mouth daily. Reported on 06/21/2015    Historical Provider, MD  torsemide (DEMADEX) 20 MG tablet TAKE ONE TABLET BY MOUTH TWICE DAILY AS NEEDED Patient taking differently: 20 mg daily.  03/21/14   Minna Merritts, MD      Review of Systems  Constitutional: Positive for appetite change and fatigue.  HENT: Negative for congestion, postnasal drip and sore throat.   Eyes: Negative.   Respiratory: Negative for cough, chest tightness and shortness of breath.   Cardiovascular: Negative for chest pain, palpitations and leg swelling.  Gastrointestinal: Negative for abdominal pain and abdominal distention.  Endocrine: Negative.   Genitourinary: Negative.   Musculoskeletal: Negative for back pain and neck pain.  Skin: Negative.   Allergic/Immunologic: Negative.   Neurological: Negative for dizziness, light-headedness and headaches.  Hematological: Negative for adenopathy. Bruises/bleeds easily.  Psychiatric/Behavioral: Negative for sleep disturbance (sleeping on 2 pillows) and dysphoric mood. The patient is not nervous/anxious.        Objective:   Physical Exam  Constitutional: He is oriented to person, place, and time. He appears  well-developed and well-nourished.  HENT:  Head: Normocephalic and atraumatic.  Eyes: Conjunctivae are normal. Pupils are equal, round, and reactive to light.  Neck: Normal range of motion. Neck supple.  Cardiovascular: Normal rate and regular rhythm.   Pulmonary/Chest: Effort normal. He has no wheezes. He has no rales.  Abdominal: Soft. He exhibits no distension. There is no tenderness.  Musculoskeletal: He exhibits no edema or tenderness.  Neurological: He is alert and oriented to person, place, and time.  Skin: Skin is warm and dry.  Psychiatric: He has a normal mood and affect. His behavior is normal. Thought content normal.  Nursing note and vitals reviewed.    BP 149/81 mmHg  Pulse 84  Resp 20  Ht 5\' 8"  (1.727 m)  Wt 183 lb (83.008 kg)  BMI 27.83 kg/m2  SpO2 100%      Assessment & Plan:  1: Chronic heart failure with preserved ejection fraction- Patient presents with fatigue upon exertion. He denied having symptoms upon walking into the office. He  denies any shortness of breath or edema. He continues to weigh himself and says that his weight has gradually increased due to his eating. By our scale, he's gained 9.2 pounds since he was here October 2016. Reminded to call for an overnight weight gain of >2 pounds or a weekly weight gain of >5 pounds. He is not adding any salt to his food and does try to eat low sodium foods. He is not drinking much water but does feel like he's staying hydrated. 2: HTN- Blood pressure looks good today. Continue medications at this time. 3: Diabetes- His wife says that his glucose this morning was 142. He's currently taking 75 units twice daily. Sees his PCP in July 2017. 4: Chronic kidney disease- He follows closely with nephrology. Still is not interested in dialysis.  Return here in 3 months or sooner for any questions/problems before then.

## 2015-06-21 NOTE — Patient Instructions (Signed)
Continue weighing daily and call for an overnight weight gain of > 2 pounds or a weekly weight gain of >5 pounds. 

## 2015-06-22 ENCOUNTER — Encounter: Payer: Self-pay | Admitting: Family

## 2015-07-11 DIAGNOSIS — R21 Rash and other nonspecific skin eruption: Secondary | ICD-10-CM | POA: Diagnosis not present

## 2015-07-14 ENCOUNTER — Other Ambulatory Visit: Payer: Self-pay | Admitting: *Deleted

## 2015-07-14 ENCOUNTER — Encounter: Payer: Self-pay | Admitting: *Deleted

## 2015-07-14 NOTE — Patient Outreach (Signed)
[facility]) Care Management  [place]  07/14/2015   SALAR KOLBA Oct 30, 1931 [id number]  Subjective: RN Health Coach telephone call to patient.  Hipaa compliance verified. He is in the green zone. Patient weight is 181 pounds. Patient appetite is poor. Per patient food has no taste at all. Patient is using supplemental nutrition and maintaining his weight. Per patient he is walking much better and only has to hold onto his wife arm if going up stairs.Patient has a walker and a cane but he feels he doesn't need them. Patient has not had any falls.  Per patient he is driving himself to the Dr and around town. Per patient he has not increased his activity more. Patient does have a membership at the [facility] but stated he is not interested in going. Per patient the only problem he is having is some itching around his head and scalp. Dr has prescribed some Kenalog cream and is seeing if this will improve.    Objective:   Encounter Medications:  Outpatient Encounter Prescriptions as of 07/14/2015  Medication Sig Note  . aspirin 81 MG tablet Take 81 mg by mouth daily.   . cloNIDine (CATAPRES) 0.1 MG tablet Take 1 tablet (0.1 mg total) by mouth 2 (two) times daily.   Marland Kitchen docusate sodium (COLACE) 100 MG capsule Take 100 mg by mouth 2 (two) times daily. Reported on 06/21/2015 01/13/2015: Uses maybe two or three times a week for constipation  . enalapril (VASOTEC) 5 MG tablet Take 5 mg by mouth daily.  08/10/2013: Received from: External Pharmacy  . Ferrous Sulfate (IRON) 325 (65 FE) MG TABS Take 1 tablet by mouth daily. Reported on 06/21/2015   . glipiZIDE (GLUCOTROL XL) 10 MG 24 hr tablet Take 10 mg by mouth 2 (two) times daily.   . hydrALAZINE (APRESOLINE) 100 MG tablet TAKE ONE TABLET BY MOUTH THREE TIMES DAILY   . hydrOXYzine (ATARAX/VISTARIL) 10 MG tablet Take 10 mg by mouth at bedtime as needed for itching.   . Insulin Lispro Prot & Lispro (HUMALOG MIX 75/25 [place]) Inject  75 Units into the skin 2 (two) times daily.  01/13/2015: 50 units subcutaneously before breakfast and supper  . metoprolol (LOPRESSOR) 50 MG tablet Take 1 tablet (50 mg total) by mouth 2 (two) times daily. 01/13/2015: Taking once daily.  Marland Kitchen omeprazole (PRILOSEC) 40 MG capsule Take 40 mg by mouth daily.   . pravastatin (PRAVACHOL) 40 MG tablet Take two tablets daily.   . Rivaroxaban (XARELTO) 15 MG TABS tablet Take 1 tablet (15 mg total) by mouth daily. 01/13/2015: Taking with meal  . torsemide (DEMADEX) 20 MG tablet TAKE ONE TABLET BY MOUTH TWICE DAILY AS NEEDED (Patient taking differently: 20 mg daily. )   . triamcinolone cream (KENALOG) 0.1 % Apply 1 application topically 2 (two) times daily.    Facility-Administered Encounter Medications as of 07/14/2015  Medication  . epoetin alfa (EPOGEN,PROCRIT) injection 40,000 Units    Functional Status:  In your present state of health, do you have any difficulty performing the following activities: 07/14/2015 06/21/2015  Hearing? N N  Vision? N Y  Difficulty concentrating or making decisions? N Y  Walking or climbing stairs? Y N  Dressing or bathing? N N  Doing errands, shopping? N Y  Conservation officer, nature and eating ? Y -  Using the Toilet? N -  In the past six months, have you accidently leaked urine? N -  Do you have problems with loss of bowel  control? N -  Managing your Medications? N -  Managing your Finances? Y -  Housekeeping or managing your Housekeeping? Y -    Fall/Depression Screening: PHQ 2/9 Scores 07/14/2015 06/21/2015 05/08/2015 04/04/2015 02/28/2015 02/20/2015 01/10/2015  PHQ - 2 Score 1 0 1 1 0 0 0   THN CM Care Plan Problem One        Most Recent Value   Care Plan Problem One  Knowledge deficit in self management of congestive heart failure   Role Documenting the Problem One  [place] for Problem One  Active   THN Long Term Goal (31-90 days)  Patient will continue to not have any readmissions in the next 90 days    THN Long Term Goal Start Date  07/14/15   Interventions for Problem One [place] is  providing ongoing educational  information on CHF, [place]  encourages patient and wife to review zones and know what and when to report to doctor to prevent readmissions.   THN CM Short Term Goal #1 (0-30 days)  Patient will report weighing daily and documenting weights within 90 days   THN CM Short Term Goal #1 Start Date  07/14/15   Interventions for Short Term Goal #1  RN Health Coach sent patient EMMI information on keeping track of your weights each day.  RN sent  EMMI information on Understanding water pills and thirst. [place] sent EMMI information on How to be salt smart [place] will continue to use teach back and discussion with the patient and wife..   THN CM Short Term Goal #3 (0-30 days)  Patient appetite will increase within 30 days with additional supplemental  nutrition   THN CM Short Term Goal #3 Start Date  07/14/15 [ongoing per patient he still can't taste the food. The supplemental nutrition is helping him to maintain and gain a little weight.]   Interventions for Short Tern Goal #3  [place] sent patient supplemental coupons for additional nutrition. RN Health Coach making sure patient is looking at CHF zones to be able to differentiate nutritional  vs symptomatic chf weight gain. RN Health Coach will also send a chart on how to read the nutritional labels on food   THN CM Short Term Goal #4 (0-30 days)  Patient and wife will be able to verbalize the meaning of elevated cholesterol and understand the good cholesterol from the bad   [facility] CM Short Term Goal #4 Start Date  07/14/15 Maida Sale continuing to help patient and wife understand. Patient is to go for physical in [place]   Interventions for Short Term Goal #4  [place] will send EMMI information on How to read cholesterol results and what they mean. RN Health Coach will have follow up  discussion within a month   THN CM Short Term Goal #5 (0-30 days)  Patient will increase exercise within the next 30 days.   THN CM Short Term Goal #5 Start Date  07/14/15 [ongoing.]   Interventions for Short Term Goal #5  RN discussed with patient about increasing exercises. RN sent educational information about the importance of exercise. Patientt stated that he has membership at [employer]. RN will  encourage patient to utlize     Assessment:  Patient will benefit from Health Coach telephonic outreach for education and support for diabetes self management.  Plan:  RN will send patient educational material on his Physical  Health about eye checks, vaccinations, dental appointments, eye care RN will send patient educational material on a low sodium diet plan RN will send patient educational material on a Dash diet RN will send patient additional supplemental nutritional discount coupons  [place] Management [phone]

## 2015-08-01 ENCOUNTER — Inpatient Hospital Stay: Payer: Commercial Managed Care - HMO | Attending: Oncology

## 2015-08-01 ENCOUNTER — Inpatient Hospital Stay (HOSPITAL_BASED_OUTPATIENT_CLINIC_OR_DEPARTMENT_OTHER): Payer: Commercial Managed Care - HMO | Admitting: Oncology

## 2015-08-01 ENCOUNTER — Inpatient Hospital Stay: Payer: Commercial Managed Care - HMO

## 2015-08-01 VITALS — BP 175/69 | HR 90 | Temp 98.5°F | Resp 18 | Wt 186.7 lb

## 2015-08-01 DIAGNOSIS — D638 Anemia in other chronic diseases classified elsewhere: Secondary | ICD-10-CM

## 2015-08-01 DIAGNOSIS — Z7901 Long term (current) use of anticoagulants: Secondary | ICD-10-CM | POA: Insufficient documentation

## 2015-08-01 DIAGNOSIS — Z79899 Other long term (current) drug therapy: Secondary | ICD-10-CM | POA: Insufficient documentation

## 2015-08-01 DIAGNOSIS — Z87891 Personal history of nicotine dependence: Secondary | ICD-10-CM | POA: Diagnosis not present

## 2015-08-01 DIAGNOSIS — N183 Chronic kidney disease, stage 3 (moderate): Secondary | ICD-10-CM

## 2015-08-01 DIAGNOSIS — I482 Chronic atrial fibrillation: Secondary | ICD-10-CM | POA: Insufficient documentation

## 2015-08-01 DIAGNOSIS — K219 Gastro-esophageal reflux disease without esophagitis: Secondary | ICD-10-CM | POA: Insufficient documentation

## 2015-08-01 DIAGNOSIS — D509 Iron deficiency anemia, unspecified: Secondary | ICD-10-CM | POA: Diagnosis not present

## 2015-08-01 DIAGNOSIS — I129 Hypertensive chronic kidney disease with stage 1 through stage 4 chronic kidney disease, or unspecified chronic kidney disease: Secondary | ICD-10-CM

## 2015-08-01 DIAGNOSIS — I5042 Chronic combined systolic (congestive) and diastolic (congestive) heart failure: Secondary | ICD-10-CM | POA: Insufficient documentation

## 2015-08-01 DIAGNOSIS — E785 Hyperlipidemia, unspecified: Secondary | ICD-10-CM | POA: Diagnosis not present

## 2015-08-01 DIAGNOSIS — E119 Type 2 diabetes mellitus without complications: Secondary | ICD-10-CM | POA: Diagnosis not present

## 2015-08-01 DIAGNOSIS — D631 Anemia in chronic kidney disease: Secondary | ICD-10-CM

## 2015-08-01 DIAGNOSIS — N189 Chronic kidney disease, unspecified: Principal | ICD-10-CM

## 2015-08-01 DIAGNOSIS — Z794 Long term (current) use of insulin: Secondary | ICD-10-CM | POA: Diagnosis not present

## 2015-08-01 DIAGNOSIS — Z7982 Long term (current) use of aspirin: Secondary | ICD-10-CM | POA: Insufficient documentation

## 2015-08-01 LAB — CBC WITH DIFFERENTIAL/PLATELET
BASOS ABS: 0.1 10*3/uL (ref 0–0.1)
BASOS PCT: 1 %
EOS ABS: 0.2 10*3/uL (ref 0–0.7)
EOS PCT: 3 %
HCT: 38.6 % — ABNORMAL LOW (ref 40.0–52.0)
Hemoglobin: 13.4 g/dL (ref 13.0–18.0)
Lymphocytes Relative: 10 %
Lymphs Abs: 0.7 10*3/uL — ABNORMAL LOW (ref 1.0–3.6)
MCH: 31.2 pg (ref 26.0–34.0)
MCHC: 34.7 g/dL (ref 32.0–36.0)
MCV: 89.9 fL (ref 80.0–100.0)
MONO ABS: 0.6 10*3/uL (ref 0.2–1.0)
Monocytes Relative: 9 %
Neutro Abs: 5.3 10*3/uL (ref 1.4–6.5)
Neutrophils Relative %: 77 %
PLATELETS: 247 10*3/uL (ref 150–440)
RBC: 4.29 MIL/uL — AB (ref 4.40–5.90)
RDW: 14.7 % — AB (ref 11.5–14.5)
WBC: 6.8 10*3/uL (ref 3.8–10.6)

## 2015-08-01 NOTE — Progress Notes (Signed)
[place]  [phone]) [phone] [phone]) [phone]  ID: OSBON SVAY OB: [birth year]  MR#: [id number]  [id number]  Patient Care Team: [place] Pink, MD as PCP - General (Family Medicine) Alisa Graff, FNP as Nurse Practitioner (Family Medicine) Rise Mu, PA-C as Physician Assistant (Cardiology) Lloyd Huger, MD as Consulting Physician (Hematology) Verlin Grills, RN as [place] [facility]  CHIEF COMPLAINT:  Chief Complaint  Patient presents with  . Anemia    INTERVAL HISTORY: Patient returns to clinic today for further evaluation, laboratory work, and consideration of additional Procrit. He currently feels well and at his baseline. He does not complain of shortness of breath today. He states he does not feel fatigued. He has no neurologic complaints. He denies any weight loss. He denies any recent fevers or illnesses. He denies any chest pain. He has no nausea, vomiting, constipation, or diarrhea. He has no urinary complaints. Patient offers no further specific complaints.   REVIEW OF SYSTEMS:   Review of Systems  Constitutional: Negative.   Respiratory: Negative.   Cardiovascular: Negative.   Gastrointestinal: Negative.   Musculoskeletal: Negative.   Neurological: Negative.     As per HPI. Otherwise, a complete review of systems is negatve.  PAST MEDICAL HISTORY: Past Medical History  Diagnosis Date  . Hypertension   . Diabetes mellitus without complication ([place])   . Hyperlipidemia   . History of gastroesophageal reflux (GERD)   . CKD (chronic kidney disease), stage III     a. stage III-IV  . Permanent atrial fibrillation ([place])     a. Dx 12/2011, Rate-controlled, chronic Xarelto (renal dosing) - CHA2DS2VASc = 5.  . Bell's palsy   . Chronic combined systolic and diastolic CHF (congestive heart failure) ([place])     a. 11/2012 Echo: EF 60-65%, mod conc LVH, mildly dil LA/RA, mild Ao sclerosis w/o  stenosis; b. 12/2014 Echo: EF 40-45%, prob mid-apicalanteroseptal, ant, apical HK, Gr2 DD, mod dil LA, mildly dil RA (technically difficult study).  . Anemia of chronic disease     a. plan of oncology to start Procrit - received during admission 12/2013  . GERD (gastroesophageal reflux disease)   . Bell's palsy   . Amputated finger   . Pleural effusion, left     a. 12/2014 s/p thoracentesis - protein <3, LDH 123, no malignancy.  . Atypical chest pain     a. 12/2014 Neg CE - in setting of L pleural effusion.    PAST SURGICAL HISTORY: Past Surgical History  Procedure Laterality Date  . Finger surgery      right hand  . Colonoscopy  09/2011  . Esophagogastroduodenoscopy    . Appendectomy      FAMILY HISTORY Family History  Problem Relation Age of Onset  . Heart attack Brother   . Hypertension    . Diabetes Mother   . Diabetes Sister        ADVANCED DIRECTIVES:    HEALTH MAINTENANCE: Social History  Substance Use Topics  . Smoking status: Former Smoker -- 0.25 packs/day for 10 years    Types: Cigars    Quit date: 05/06/1966  . Smokeless tobacco: Never Used  . Alcohol Use: No    Allergies  Allergen Reactions  . No Known Allergies     Current Outpatient Prescriptions  Medication Sig Dispense Refill  . aspirin 81 MG tablet Take 81 mg by mouth daily.    . cloNIDine (CATAPRES) 0.1 MG tablet Take 1 tablet (0.1 mg  total) by mouth 2 (two) times daily. 60 tablet 11  . docusate sodium (COLACE) 100 MG capsule Take 100 mg by mouth 2 (two) times daily. Reported on 06/21/2015    . enalapril (VASOTEC) 5 MG tablet Take 5 mg by mouth daily.     . Ferrous Sulfate (IRON) 325 (65 FE) MG TABS Take 1 tablet by mouth daily. Reported on 06/21/2015    . glipiZIDE (GLUCOTROL XL) 10 MG 24 hr tablet Take 10 mg by mouth 2 (two) times daily.    . hydrALAZINE (APRESOLINE) 100 MG tablet TAKE ONE TABLET BY MOUTH THREE TIMES DAILY 270 tablet 0  . hydrOXYzine (ATARAX/VISTARIL) 10 MG tablet Take 10  mg by mouth at bedtime as needed for itching.    . Insulin Lispro Prot & Lispro (HUMALOG MIX 75/25 [place]) Inject 75 Units into the skin 2 (two) times daily.     . metoprolol (LOPRESSOR) 50 MG tablet Take 1 tablet (50 mg total) by mouth 2 (two) times daily. 180 tablet 3  . omeprazole (PRILOSEC) 40 MG capsule Take 40 mg by mouth daily.    . pravastatin (PRAVACHOL) 40 MG tablet Take two tablets daily.    . Rivaroxaban (XARELTO) 15 MG TABS tablet Take 1 tablet (15 mg total) by mouth daily. 30 tablet 3  . torsemide (DEMADEX) 20 MG tablet TAKE ONE TABLET BY MOUTH TWICE DAILY AS NEEDED (Patient taking differently: 20 mg daily. ) 180 tablet 3  . triamcinolone cream (KENALOG) 0.1 % Apply 1 application topically 2 (two) times daily.     No current facility-administered medications for this visit.   Facility-Administered Medications Ordered in Other Visits  Medication Dose Route Frequency Provider Last Rate Last Dose  . epoetin alfa (EPOGEN,PROCRIT) injection 40,000 Units  40,000 Units Subcutaneous Once Lloyd Huger, MD        OBJECTIVE: Filed Vitals:   08/01/15 1418  BP: 175/69  Pulse: 90  Temp: 98.5 F (36.9 C)  Resp: 18     Body mass index is 28.4 kg/(m^2).    ECOG FS:1 - Symptomatic but completely ambulatory  General: Well-developed, well-nourished, no acute distress. Eyes: anicteric sclera. Lungs: Clear to auscultation bilaterally. Heart: Regular rate and rhythm. No rubs, murmurs, or gallops. Abdomen: Soft, nontender, nondistended. No organomegaly noted, normoactive bowel sounds. Musculoskeletal: No edema, cyanosis, or clubbing. Neuro: Alert, answering all questions appropriately. Cranial nerves grossly intact. Skin: No rashes or petechiae noted. Psych: Normal affect.   LAB RESULTS:  Lab Results  Component Value Date   NA 138 12/26/2014   K 3.5 12/26/2014   CL 105 12/26/2014   CO2 27 12/26/2014   GLUCOSE 84 12/26/2014   BUN 42* 12/26/2014   CREATININE 2.12* 12/26/2014    CALCIUM 8.3* 12/26/2014   PROT 6.2* 12/24/2014   ALBUMIN 2.4* 12/24/2014   AST 19 12/24/2014   ALT 12* 12/24/2014   ALKPHOS 63 12/24/2014   BILITOT 0.5 12/24/2014   GFRNONAA 28* 12/26/2014   GFRAA 32* 12/26/2014    Lab Results  Component Value Date   WBC 6.8 08/01/2015   NEUTROABS 5.3 08/01/2015   HGB 13.4 08/01/2015   HCT 38.6* 08/01/2015   MCV 89.9 08/01/2015   PLT 247 08/01/2015   Lab Results  Component Value Date   IRON 49 05/02/2015   TIBC 345 05/02/2015   IRONPCTSAT 14* 05/02/2015    Lab Results  Component Value Date   FERRITIN 73 05/02/2015     STUDIES: No results found.  ASSESSMENT: Chronic anemia secondary  to renal insufficiency as well as iron deficiency.  PLAN:    1. Anemia: Patient's hemoglobin is 13.4, therefore he will not need Procrit today. Return to clinic in 6 months for consideration of Procrit if his hemoglobin is below 10.0. His iron stores are improved.  He did receive one infusion of IV Feraheme in mid October 2015. 2. Chronic renal insufficiency:Treatment per nephrology.   Patient expressed understanding and was in agreement with this plan. He also understands that He can call clinic at any time with any questions, concerns, or complaints.    Mayra Reel, NP   08/01/2015 2:47 PM  Patient was seen and evaluated independently and I agree with the assessment and plan as dictated above.  Lloyd Huger, MD 08/07/2015 10:47 AM

## 2015-08-01 NOTE — Progress Notes (Signed)
Patient does not offer any problems today.  

## 2015-08-05 DIAGNOSIS — J208 Acute bronchitis due to other specified organisms: Secondary | ICD-10-CM | POA: Diagnosis not present

## 2015-08-05 DIAGNOSIS — B9689 Other specified bacterial agents as the cause of diseases classified elsewhere: Secondary | ICD-10-CM | POA: Diagnosis not present

## 2015-08-05 DIAGNOSIS — R05 Cough: Secondary | ICD-10-CM | POA: Diagnosis not present

## 2015-08-11 ENCOUNTER — Other Ambulatory Visit: Payer: Self-pay | Admitting: *Deleted

## 2015-08-11 ENCOUNTER — Ambulatory Visit: Payer: Self-pay | Admitting: *Deleted

## 2015-08-11 NOTE — Patient Outreach (Signed)
[facility]) Care Management  08/11/2015  Patrick [name] [birth year] [id number]   RN Health Coach  Attempted #1  Follow up outreach call to patient.  Patient was unavailable. Line was busy no voice mail pickup.  [place] [facility] [phone]

## 2015-08-25 ENCOUNTER — Other Ambulatory Visit: Payer: Self-pay | Admitting: *Deleted

## 2015-09-18 DIAGNOSIS — I129 Hypertensive chronic kidney disease with stage 1 through stage 4 chronic kidney disease, or unspecified chronic kidney disease: Secondary | ICD-10-CM | POA: Diagnosis not present

## 2015-09-18 DIAGNOSIS — R809 Proteinuria, unspecified: Secondary | ICD-10-CM | POA: Diagnosis not present

## 2015-09-18 DIAGNOSIS — E1122 Type 2 diabetes mellitus with diabetic chronic kidney disease: Secondary | ICD-10-CM | POA: Diagnosis not present

## 2015-09-18 DIAGNOSIS — N183 Chronic kidney disease, stage 3 (moderate): Secondary | ICD-10-CM | POA: Diagnosis not present

## 2015-09-20 ENCOUNTER — Ambulatory Visit: Payer: Commercial Managed Care - HMO | Attending: Family | Admitting: Family

## 2015-09-20 ENCOUNTER — Encounter: Payer: Self-pay | Admitting: Family

## 2015-09-20 VITALS — BP 167/82 | HR 78 | Resp 18 | Ht 67.0 in | Wt 191.0 lb

## 2015-09-20 DIAGNOSIS — Z79899 Other long term (current) drug therapy: Secondary | ICD-10-CM | POA: Diagnosis not present

## 2015-09-20 DIAGNOSIS — I5042 Chronic combined systolic (congestive) and diastolic (congestive) heart failure: Secondary | ICD-10-CM | POA: Insufficient documentation

## 2015-09-20 DIAGNOSIS — K219 Gastro-esophageal reflux disease without esophagitis: Secondary | ICD-10-CM | POA: Diagnosis not present

## 2015-09-20 DIAGNOSIS — E785 Hyperlipidemia, unspecified: Secondary | ICD-10-CM | POA: Insufficient documentation

## 2015-09-20 DIAGNOSIS — Z7901 Long term (current) use of anticoagulants: Secondary | ICD-10-CM | POA: Diagnosis not present

## 2015-09-20 DIAGNOSIS — I130 Hypertensive heart and chronic kidney disease with heart failure and stage 1 through stage 4 chronic kidney disease, or unspecified chronic kidney disease: Secondary | ICD-10-CM | POA: Diagnosis not present

## 2015-09-20 DIAGNOSIS — I10 Essential (primary) hypertension: Secondary | ICD-10-CM

## 2015-09-20 DIAGNOSIS — Z7982 Long term (current) use of aspirin: Secondary | ICD-10-CM | POA: Diagnosis not present

## 2015-09-20 DIAGNOSIS — N183 Chronic kidney disease, stage 3 unspecified: Secondary | ICD-10-CM

## 2015-09-20 DIAGNOSIS — D649 Anemia, unspecified: Secondary | ICD-10-CM | POA: Diagnosis not present

## 2015-09-20 DIAGNOSIS — Z794 Long term (current) use of insulin: Secondary | ICD-10-CM | POA: Diagnosis not present

## 2015-09-20 DIAGNOSIS — Z87891 Personal history of nicotine dependence: Secondary | ICD-10-CM | POA: Insufficient documentation

## 2015-09-20 DIAGNOSIS — I482 Chronic atrial fibrillation: Secondary | ICD-10-CM | POA: Insufficient documentation

## 2015-09-20 DIAGNOSIS — E1122 Type 2 diabetes mellitus with diabetic chronic kidney disease: Secondary | ICD-10-CM | POA: Diagnosis not present

## 2015-09-20 DIAGNOSIS — Z9889 Other specified postprocedural states: Secondary | ICD-10-CM | POA: Diagnosis not present

## 2015-09-20 DIAGNOSIS — I5032 Chronic diastolic (congestive) heart failure: Secondary | ICD-10-CM

## 2015-09-20 MED ORDER — PRAVASTATIN SODIUM 40 MG PO TABS: 80.0000 mg | ORAL_TABLET | Freq: Every day | ORAL | Status: DC

## 2015-09-20 MED ORDER — HYDRALAZINE HCL 100 MG PO TABS: 100.0000 mg | ORAL_TABLET | Freq: Three times a day (TID) | ORAL | Status: DC

## 2015-09-20 MED ORDER — CLONIDINE HCL 0.1 MG PO TABS: 0.1000 mg | ORAL_TABLET | Freq: Two times a day (BID) | ORAL | Status: DC

## 2015-09-20 MED ORDER — OMEPRAZOLE 40 MG PO CPDR: 40.0000 mg | DELAYED_RELEASE_CAPSULE | Freq: Every day | ORAL | Status: DC

## 2015-09-20 MED ORDER — METOPROLOL TARTRATE 50 MG PO TABS: 50.0000 mg | ORAL_TABLET | Freq: Two times a day (BID) | ORAL | Status: DC

## 2015-09-20 MED ORDER — GLIPIZIDE ER 10 MG PO TB24
10.0000 mg | ORAL_TABLET | Freq: Two times a day (BID) | ORAL | Status: DC
Start: 2015-09-20 — End: 2015-11-13

## 2015-09-20 MED ORDER — ENALAPRIL MALEATE 5 MG PO TABS: 5.0000 mg | ORAL_TABLET | Freq: Every day | ORAL | Status: DC

## 2015-09-20 NOTE — Patient Instructions (Addendum)
Continue weighing daily and call for an overnight weight gain of > 2 pounds or a weekly weight gain of >5 pounds. 

## 2015-09-20 NOTE — Progress Notes (Signed)
Subjective:    Patient ID: Patrick [name], male    DOB: 1931-09-14, 82 y.o.   MRN: [id number]  Congestive Heart Failure Presents for follow-up visit. The disease course has been stable. Associated symptoms include shortness of breath. Pertinent negatives include no abdominal pain, chest pain, edema, fatigue, orthopnea or palpitations. The symptoms have been stable. Past treatments include salt and fluid restriction, ACE inhibitors and beta blockers. The treatment provided moderate relief. Compliance with prior treatments has been good. His past medical history is significant for anemia, arrhythmia, DM and HTN. There is no history of CVA. He has one 1st degree relative with heart disease.  Hypertension This is a chronic problem. The current episode started more than 1 year ago. The problem has been waxing and waning since onset. Associated symptoms include shortness of breath. Pertinent negatives include no chest pain, headaches, neck pain, palpitations or peripheral edema. There are no associated agents to hypertension. Risk factors for coronary artery disease include diabetes mellitus, dyslipidemia, family history and male gender. Past treatments include beta blockers, ACE inhibitors, diuretics, lifestyle changes and alpha 1 blockers. The current treatment provides moderate improvement. Hypertensive end-organ damage includes kidney disease and heart failure.    Past Medical History  Diagnosis Date  . Hypertension   . Diabetes mellitus without complication ([place])   . Hyperlipidemia   . History of gastroesophageal reflux (GERD)   . CKD (chronic kidney disease), stage III     a. stage III-IV  . Permanent atrial fibrillation ([place])     a. Dx 12/2011, Rate-controlled, chronic Xarelto (renal dosing) - CHA2DS2VASc = 5.  . Bell's palsy   . Chronic combined systolic and diastolic CHF (congestive heart failure) ([place])     a. 11/2012 Echo: EF 60-65%, mod conc LVH, mildly dil LA/RA, mild Ao  sclerosis w/o stenosis; b. 12/2014 Echo: EF 40-45%, prob mid-apicalanteroseptal, ant, apical HK, Gr2 DD, mod dil LA, mildly dil RA (technically difficult study).  . Anemia of chronic disease     a. plan of oncology to start Procrit - received during admission 12/2013  . GERD (gastroesophageal reflux disease)   . Bell's palsy   . Amputated finger   . Pleural effusion, left     a. 12/2014 s/p thoracentesis - protein <3, LDH 123, no malignancy.  . Atypical chest pain     a. 12/2014 Neg CE - in setting of L pleural effusion.    Past Surgical History  Procedure Laterality Date  . Finger surgery      right hand  . Colonoscopy  09/2011  . Esophagogastroduodenoscopy    . Appendectomy      Family History  Problem Relation Age of Onset  . Heart attack Brother   . Hypertension    . Diabetes Mother   . Diabetes Sister     Social History  Substance Use Topics  . Smoking status: Former Smoker -- 0.25 packs/day for 10 years    Types: Cigars    Quit date: 05/06/1966  . Smokeless tobacco: Never Used  . Alcohol Use: No    Allergies  Allergen Reactions  . No Known Allergies     Prior to Admission medications   Medication Sig Start Date End Date Taking? Authorizing Provider  aspirin 81 MG tablet Take 81 mg by mouth daily.   Yes Historical Provider, MD  cloNIDine (CATAPRES) 0.1 MG tablet Take 1 tablet (0.1 mg total) by mouth 2 (two) times daily. 09/20/15  Yes Alisa Graff, FNP  docusate  sodium (COLACE) 100 MG capsule Take 100 mg by mouth 2 (two) times daily. Reported on 06/21/2015   Yes Historical Provider, MD  enalapril (VASOTEC) 5 MG tablet Take 1 tablet (5 mg total) by mouth daily. 09/20/15  Yes Alisa Graff, FNP  Ferrous Sulfate (IRON) 325 (65 FE) MG TABS Take 1 tablet by mouth daily. Reported on 06/21/2015   Yes Historical Provider, MD  glipiZIDE (GLUCOTROL XL) 10 MG 24 hr tablet Take 1 tablet (10 mg total) by mouth 2 (two) times daily. 09/20/15  Yes Alisa Graff, FNP  hydrALAZINE  (APRESOLINE) 100 MG tablet Take 1 tablet (100 mg total) by mouth 3 (three) times daily. 09/20/15  Yes Alisa Graff, FNP  Insulin Lispro Prot & Lispro (HUMALOG MIX 75/25 [place]) Inject 75 Units into the skin 2 (two) times daily.    Yes Historical Provider, MD  metoprolol (LOPRESSOR) 50 MG tablet Take 1 tablet (50 mg total) by mouth 2 (two) times daily. 09/20/15  Yes Alisa Graff, FNP  omeprazole (PRILOSEC) 40 MG capsule Take 1 capsule (40 mg total) by mouth daily. 09/20/15  Yes Alisa Graff, FNP  pravastatin (PRAVACHOL) 40 MG tablet Take 2 tablets (80 mg total) by mouth daily. Take two tablets daily. 09/20/15  Yes Alisa Graff, FNP  Rivaroxaban (XARELTO) 15 MG TABS tablet Take 1 tablet (15 mg total) by mouth daily. 05/05/12  Yes Minna Merritts, MD  torsemide (DEMADEX) 20 MG tablet TAKE ONE TABLET BY MOUTH TWICE DAILY AS NEEDED Patient taking differently: 20 mg daily.  03/21/14  Yes Minna Merritts, MD     Review of Systems  Constitutional: Negative for appetite change and fatigue.  HENT: Negative for congestion, postnasal drip and sore throat.   Eyes: Negative.   Respiratory: Positive for shortness of breath. Negative for cough, chest tightness and wheezing.   Cardiovascular: Negative for chest pain, palpitations and leg swelling.  Gastrointestinal: Negative for abdominal pain and abdominal distention.  Endocrine: Negative.   Genitourinary: Negative.   Musculoskeletal: Negative for back pain and neck pain.  Skin: Negative.   Allergic/Immunologic: Negative.   Neurological: Negative for dizziness, light-headedness and headaches.  Hematological: Negative for adenopathy. Bruises/bleeds easily.  Psychiatric/Behavioral: Negative for sleep disturbance (sleeping on 2 pillows) and dysphoric mood. The patient is not nervous/anxious.        Objective:   Physical Exam  Constitutional: He is oriented to person, place, and time. He appears well-developed and well-nourished.  HENT:  Head:  Normocephalic and atraumatic.  Eyes: Conjunctivae are normal. Pupils are equal, round, and reactive to light.  Neck: Normal range of motion. Neck supple.  Cardiovascular: Normal rate and regular rhythm.   Pulmonary/Chest: Effort normal. He has no wheezes. He has no rales.  Abdominal: Soft. He exhibits no distension. There is no tenderness.  Musculoskeletal: He exhibits edema (trace swelling above sock line). He exhibits no tenderness.  Neurological: He is alert and oriented to person, place, and time.  Skin: Skin is warm and dry.  Psychiatric: He has a normal mood and affect. His behavior is normal. Thought content normal.  Nursing note and vitals reviewed.   BP 167/82 mmHg  Pulse 78  Resp 18  Ht 5\' 7"  (1.702 m)  Wt 191 lb (86.637 kg)  BMI 29.91 kg/m2  SpO2 98%       Assessment & Plan:  1: Chronic heart failure with preserved ejection fraction- Patient presents with shortness of breath with moderate exertion (Class II) which improves  quickly upon rest. Denies having any swelling in his legs. Reports an improvement in his appetite. He continues to weigh himself and says that he's gained 3 pounds overnight but the night before, he had lost weight. No change in his symptoms. By our scale, he's gained 8 pounds since he was last here March 2017. Instructed his wife to give him his 20mg  torsemide tomorrow if his weight hasn't gone back down. He is currently taking his torsemide as needed and doesn't take it very often. He is not adding any salt to his food and is trying to eat low sodium foods.  2: HTN- Blood pressure mildly elevated here and his home readings show some sporadic elevations as well. Could be coming from his increase in weight. His wife will continue to monitor his blood pressure at home and will call his PCP if it remains elevated. 3: CKD- Follows closely with nephrology and is still not interested in dialysis at this time.  Medication bottles were reviewed and refills  provided.  Return here in 3 months or sooner for any questions/problems before then.

## 2015-09-22 ENCOUNTER — Encounter: Payer: Self-pay | Admitting: *Deleted

## 2015-09-22 NOTE — Patient Outreach (Signed)
[facility]) Care Management  [place]  08/25/2015 Late entry  SHAFER GUERNSEY 31-Oct-1931 [id number]  Subjective: [place] telephone call to patient.  [place] compliance verified. Per patient he is not having any swelling or shortness of breath. Per patient he is in the green zone. Patient states that his appetite is not very good and that he mostly eats applesauce and supplements with nutritional supplements. RN explained about drinking nephro as a supplement with decrease kidney function.  Per patient his weight today is 186. Per patient he is taking his medications as per ordered. Patient is keeping his appointments. Patient has agreed to follow up outreach call.   Objective:   Encounter Medications:  Outpatient Encounter Prescriptions as of 08/25/2015  Medication Sig Note  . aspirin 81 MG tablet Take 81 mg by mouth daily.   Marland Kitchen docusate sodium (COLACE) 100 MG capsule Take 100 mg by mouth 2 (two) times daily. Reported on 06/21/2015 01/13/2015: Uses maybe two or three times a week for constipation  . Ferrous Sulfate (IRON) 325 (65 FE) MG TABS Take 1 tablet by mouth daily. Reported on 06/21/2015   . Insulin Lispro Prot & Lispro (HUMALOG MIX 75/25 [place]) Inject 75 Units into the skin 2 (two) times daily.  01/13/2015: 50 units subcutaneously before breakfast and supper  . Rivaroxaban (XARELTO) 15 MG TABS tablet Take 1 tablet (15 mg total) by mouth daily. 01/13/2015: Taking with meal  . torsemide (DEMADEX) 20 MG tablet TAKE ONE TABLET BY MOUTH TWICE DAILY AS NEEDED (Patient taking differently: 20 mg daily as needed. )   . [DISCONTINUED] cloNIDine (CATAPRES) 0.1 MG tablet Take 1 tablet (0.1 mg total) by mouth 2 (two) times daily.   . [DISCONTINUED] enalapril (VASOTEC) 5 MG tablet Take 5 mg by mouth daily.  08/10/2013: Received from: External Pharmacy  . [DISCONTINUED] glipiZIDE (GLUCOTROL XL) 10 MG 24 hr tablet Take 10 mg by mouth 2 (two) times daily.   .  [DISCONTINUED] hydrALAZINE (APRESOLINE) 100 MG tablet TAKE ONE TABLET BY MOUTH THREE TIMES DAILY   . [DISCONTINUED] hydrOXYzine (ATARAX/VISTARIL) 10 MG tablet Take 10 mg by mouth at bedtime as needed for itching.   . [DISCONTINUED] metoprolol (LOPRESSOR) 50 MG tablet Take 1 tablet (50 mg total) by mouth 2 (two) times daily. 01/13/2015: Taking once daily.  . [DISCONTINUED] omeprazole (PRILOSEC) 40 MG capsule Take 40 mg by mouth daily.   . [DISCONTINUED] pravastatin (PRAVACHOL) 40 MG tablet Take two tablets daily.   . [DISCONTINUED] triamcinolone cream (KENALOG) 0.1 % Apply 1 application topically 2 (two) times daily.    Facility-Administered Encounter Medications as of 08/25/2015  Medication  . epoetin alfa (EPOGEN,PROCRIT) injection 40,000 Units    Functional Status:  In your present state of health, do you have any difficulty performing the following activities: 09/20/2015 08/25/2015  Hearing? N N  Vision? N N  Difficulty concentrating or making decisions? N N  Walking or climbing stairs? N Y  Dressing or bathing? N N  Doing errands, shopping? N N  Preparing Food and eating ? - Y  Using the Toilet? - N  In the past six months, have you accidently leaked urine? - N  Do you have problems with loss of bowel control? - N  Managing your Medications? - N  Managing your Finances? - Y  Housekeeping or managing your Housekeeping? - Y    Fall/Depression Screening: PHQ 2/9 Scores 09/20/2015 08/25/2015 07/14/2015 06/21/2015 05/08/2015 04/04/2015 02/28/2015  PHQ - 2 Score 0  1 1 0 1 1 0   THN CM Care Plan Problem One        Most Recent Value   Care Plan Problem One  Knowledge deficit in self management of congestive heart failure   Role Documenting the Problem One  [place] for Problem One  Active   THN Long Term Goal (31-90 days)  Patient will continue to not have any readmissions in the next 90 days   THN Long Term Goal Start Date  08/25/15   Interventions for Problem One [place] is  providing ongoing educational  information on CHF, [place]  encourages patient and wife to review zones and know what and when to report to doctor to prevent readmissions.   THN CM Short Term Goal #2 (0-30 days)  Patient will understand congestive heart failure and the zones and action plans within the next 30 days   THN CM Short Term Goal #2 Start Date  08/25/15   Interventions for Short Term Goal #2  Rn Health Coach CHF Zone magnet and action plan to place on refrigeratior. RN Health Coach will send patient EMMI information on heart failure working with your doctor and When to call your Doctor or 911.Marland Kitchen RN Health coach will use teach back    THN CM Short Term Goal #3 (0-30 days)  Patient appetite will increase within 30 days with additional supplemental  nutrition   THN CM Short Term Goal #3 Start Date  08/25/15   Interventions for Short Tern Goal #3  [place] sent patient supplemental coupons for additional nutrition. RN Health Coach making sure patient is looking at CHF zones to be able to differentiate nutritional  vs symptomatic chf weight gain. RN Health Coach will also send a chart on how to read the nutritional labels on food   THN CM Short Term Goal #4 (0-30 days)  Patient and wife will be able to verbalize the meaning of elevated cholesterol and understand the good cholesterol from the bad   [facility] CM Short Term Goal #4 Start Date  08/25/15   Interventions for Short Term Goal #4  [place] will send EMMI information on How to read cholesterol results and what they mean. RN Health Coach will have follow up discussion within a month   THN CM Short Term Goal #5 (0-30 days)  Patient will increase exercise within the next 30 days.   THN CM Short Term Goal #5 Start Date  08/25/15   Interventions for Short Term Goal #5  RN discussed with patient about increasing exercises. RN sent educational information about the importance of exercise. Patientt stated that  he has membership at [employer]. RN will  encourage patient to utlize      Assessment:  Patient will  Continue to benefit from [employer] telephonic outreach for education and support for diabetes self management.  Plan:  RN will send patient educational information on Peripheral edema RN will send patient a list of healthy snacks RN will send patient educational material on hypertension RN sent educational information on [place] [facility] [phone]

## 2015-10-12 ENCOUNTER — Other Ambulatory Visit: Payer: Self-pay | Admitting: *Deleted

## 2015-10-12 NOTE — Patient Outreach (Signed)
[facility]) Care Management  10/12/2015  ANFRENEE MCCLARIN 06-22-1931 [id number]  RN Health Coach  Attempted #1  outreach call to patient.  Patient was unavailable. No voice mail pickup. Plan: RN will call patient again within 14 days.  [place] Care Management [phone]

## 2015-10-23 DIAGNOSIS — N183 Chronic kidney disease, stage 3 (moderate): Secondary | ICD-10-CM | POA: Diagnosis not present

## 2015-10-23 DIAGNOSIS — Z794 Long term (current) use of insulin: Secondary | ICD-10-CM | POA: Diagnosis not present

## 2015-10-23 DIAGNOSIS — E11649 Type 2 diabetes mellitus with hypoglycemia without coma: Secondary | ICD-10-CM | POA: Diagnosis not present

## 2015-10-23 DIAGNOSIS — E1122 Type 2 diabetes mellitus with diabetic chronic kidney disease: Secondary | ICD-10-CM | POA: Diagnosis not present

## 2015-10-23 DIAGNOSIS — N184 Chronic kidney disease, stage 4 (severe): Secondary | ICD-10-CM | POA: Diagnosis not present

## 2015-10-27 ENCOUNTER — Other Ambulatory Visit: Payer: Self-pay | Admitting: *Deleted

## 2015-10-27 ENCOUNTER — Encounter: Payer: Self-pay | Admitting: *Deleted

## 2015-10-27 NOTE — Patient Outreach (Signed)
[facility]) Care Management  10/27/2015   Patrick [name] 1932-03-03 [id number]  [place] telephone call to patient.  [place] compliance verified. RN initially talked with patient and patient gives wife the phone since she is the main care giver. Patient weight today is 182 pounds and his blood sugar today is 142.  Patient A1C is 5.9 per wife.  Patient has some shortness of breath on exertion. Patient has small amount of edema in lower extremity. He is in the green zone. Per wife she checks every day because she stated, " I don't want him to get like he was." Per wife patient will get his flu shots in a couple of months. Patient is drinking supplemental nutrition to supplement his meals. He still is mostly eating applesauce. Per  Wife his appetite is getting a little better Patient and husband has agreed to follow up outreach calls.       Objective:   Current Medications:  Current Outpatient Prescriptions  Medication Sig Dispense Refill  . aspirin 81 MG tablet Take 81 mg by mouth daily.    . cloNIDine (CATAPRES) 0.1 MG tablet Take 1 tablet (0.1 mg total) by mouth 2 (two) times daily. 180 tablet 3  . docusate sodium (COLACE) 100 MG capsule Take 100 mg by mouth 2 (two) times daily. Reported on 06/21/2015    . enalapril (VASOTEC) 5 MG tablet Take 1 tablet (5 mg total) by mouth daily. 90 tablet 3  . Ferrous Sulfate (IRON) 325 (65 FE) MG TABS Take 1 tablet by mouth daily. Reported on 06/21/2015    . glipiZIDE (GLUCOTROL XL) 10 MG 24 hr tablet Take 1 tablet (10 mg total) by mouth 2 (two) times daily. 180 tablet 3  . hydrALAZINE (APRESOLINE) 100 MG tablet Take 1 tablet (100 mg total) by mouth 3 (three) times daily. 270 tablet 3  . Insulin Lispro Prot & Lispro (HUMALOG MIX 75/25 [place]) Inject 75 Units into the skin 2 (two) times daily.     . metoprolol (LOPRESSOR) 50 MG tablet Take 1 tablet (50 mg total) by mouth 2 (two) times daily. 180 tablet 3  . omeprazole  (PRILOSEC) 40 MG capsule Take 1 capsule (40 mg total) by mouth daily. 90 capsule 3  . pravastatin (PRAVACHOL) 40 MG tablet Take 2 tablets (80 mg total) by mouth daily. Take two tablets daily. 180 tablet 3  . Rivaroxaban (XARELTO) 15 MG TABS tablet Take 1 tablet (15 mg total) by mouth daily. 30 tablet 3  . torsemide (DEMADEX) 20 MG tablet TAKE ONE TABLET BY MOUTH TWICE DAILY AS NEEDED (Patient taking differently: 20 mg daily as needed. ) 180 tablet 3   No current facility-administered medications for this visit.    Facility-Administered Medications Ordered in Other Visits  Medication Dose Route Frequency Provider Last Rate Last Dose  . epoetin alfa (EPOGEN,PROCRIT) injection 40,000 Units  40,000 Units Subcutaneous Once Lloyd Huger, MD        Functional Status:  In your present state of health, do you have any difficulty performing the following activities: 10/27/2015 09/20/2015  Hearing? N N  Vision? N N  Difficulty concentrating or making decisions? N N  Walking or climbing stairs? Y N  Dressing or bathing? N N  Doing errands, shopping? N N  Preparing Food and eating ? Y -  Using the Toilet? N -  In the past six months, have you accidently leaked urine? N -  Do you have problems with loss of  bowel control? N -  Managing your Medications? N -  Managing your Finances? Y -  Housekeeping or managing your Housekeeping? Y -  Some recent data might be hidden    Fall/Depression Screening: PHQ 2/9 Scores 10/27/2015 09/20/2015 08/25/2015 07/14/2015 06/21/2015 05/08/2015 04/04/2015  PHQ - 2 Score 1 0 1 1 0 1 1   THN CM Care Plan Problem One   Flowsheet Row Most Recent Value  Care Plan Problem One  Knowledge deficit in self management of congestive heart failure  Role Documenting the Problem One  Health Coach  THN Long Term Goal (31-90 days)  Patient will continue to not have any readmissions in the next 90 days  THN Long Term Goal Start Date  10/27/15  Interventions for Problem One [place] is  providing ongoing educational  information on CHF, [place]  encourages patient and wife to review zones and know what and when to report to doctor to prevent readmissions.  THN CM Short Term Goal #1 (0-30 days)  Patient will report weighing daily and documenting weights within 90 days  THN CM Short Term Goal #1 Met Date  10/27/15  Interventions for Short Term Goal #1  [place] sent patient EMMI information on keeping track of your weights each day.  RN sent  EMMI information on Understanding water pills and thirst. [place] sent EMMI information on How to be salt smart [place] will continue to use teach back and discussion with the patient and wife..  THN CM Short Term Goal #2 (0-30 days)  Patient will understand congestive heart failure and the zones and action plans within the next 30 days  THN CM Short Term Goal #2 Met Date  10/27/15  Interventions for Short Term Goal #2  Rn Health Coach CHF Zone magnet and action plan to place on refrigeratior. RN Health Coach will send patient EMMI information on heart failure working with your doctor and When to call your Doctor or 911.Marland Kitchen RN Health coach will use teach back   THN CM Short Term Goal #3 (0-30 days)  Patient appetite will increase within 30 days with additional supplemental  nutrition  THN CM Short Term Goal #3 Start Date  10/27/15  Interventions for Short Tern Goal #3  [place] sent patient supplemental coupons for additional nutrition. RN Health Coach making sure patient is looking at CHF zones to be able to differentiate nutritional  vs symptomatic chf weight gain. RN Health Coach will also send a chart on how to read the nutritional labels on food  THN CM Short Term Goal #5 (0-30 days)  Patient will increase exercise within the next 30 days.  THN CM Short Term Goal #5 Start Date  10/27/15  Interventions for Short Term Goal #5  RN discussed with patient about increasing exercises. RN  sent educational information about the importance of exercise. Patientt stated that he has membership at [employer]. RN will  encourage patient to utlize       Assessment:  Patient appetite is better Patient is in the green zone Hgb A1C is 5.9 Patient will continue to  benefit from [place] telephonic outreach for education and support for Congestive Heart Failure self management.  Plan:  RN sent EMMI Heart Failure when to call 911 RN sent nutritional supplement coupons RN sent educational material on [employer] Exhaustion RN sent educational material on Elderly hydration RN will follow up outreach  in October  [place] Care Management [phone]

## 2015-11-10 ENCOUNTER — Ambulatory Visit: Payer: Commercial Managed Care - HMO | Admitting: Nurse Practitioner

## 2015-11-13 ENCOUNTER — Ambulatory Visit (INDEPENDENT_AMBULATORY_CARE_PROVIDER_SITE_OTHER): Payer: Commercial Managed Care - HMO | Admitting: Nurse Practitioner

## 2015-11-13 ENCOUNTER — Encounter: Payer: Self-pay | Admitting: Nurse Practitioner

## 2015-11-13 VITALS — BP 195/120 | HR 98 | Ht 68.0 in | Wt 191.5 lb

## 2015-11-13 DIAGNOSIS — I10 Essential (primary) hypertension: Secondary | ICD-10-CM

## 2015-11-13 DIAGNOSIS — Z9119 Patient's noncompliance with other medical treatment and regimen: Secondary | ICD-10-CM | POA: Diagnosis not present

## 2015-11-13 DIAGNOSIS — I481 Persistent atrial fibrillation: Secondary | ICD-10-CM | POA: Diagnosis not present

## 2015-11-13 DIAGNOSIS — I5042 Chronic combined systolic (congestive) and diastolic (congestive) heart failure: Secondary | ICD-10-CM

## 2015-11-13 DIAGNOSIS — I4819 Other persistent atrial fibrillation: Secondary | ICD-10-CM

## 2015-11-13 DIAGNOSIS — Z91199 Patient's noncompliance with other medical treatment and regimen due to unspecified reason: Secondary | ICD-10-CM

## 2015-11-13 NOTE — Progress Notes (Signed)
Office Visit    Patient Name: Patrick [name] Date of Encounter: 11/13/2015  Primary Care Provider:  [place] Pink, MD Primary Cardiologist:  Johnny Bridge, MD   Chief Complaint    82 y/o male with a h/o persistent AF, HTN, CKD III-IV, chronic combined syst/diast CHF, and L pleural effusion, who presents for f/u.  Past Medical History    Past Medical History:  Diagnosis Date  . Amputated finger   . Anemia of chronic disease    a. plan of oncology to start Procrit - received during admission 12/2013  . Atypical chest pain    a. 12/2014 Neg CE - in setting of L pleural effusion.  . Bell's palsy   . Bell's palsy   . Chronic combined systolic and diastolic CHF (congestive heart failure) ([place])    a. 11/2012 Echo: EF 60-65%, mod conc LVH, mildly dil LA/RA, mild Ao sclerosis w/o stenosis; b. 11/2014 Echo: EF 40-45%, prob mid-apicalanteroseptal, ant, apical HK, Gr2 DD, mod dil LA, mildly dil RA (technically difficult study).  . CKD (chronic kidney disease), stage III    a. stage III-IV  . Diabetes mellitus without complication ([place])   . GERD (gastroesophageal reflux disease)   . History of gastroesophageal reflux (GERD)   . Hyperlipidemia   . Hypertension   . Permanent atrial fibrillation ([place])    a. Dx 12/2011, Rate-controlled, chronic Xarelto (renal dosing) - CHA2DS2VASc = 5.  . Pleural effusion, left    a. 12/2014 s/p thoracentesis - protein <3, LDH 123, no malignancy.   Past Surgical History:  Procedure Laterality Date  . APPENDECTOMY    . COLONOSCOPY  09/2011  . ESOPHAGOGASTRODUODENOSCOPY    . FINGER SURGERY     right hand    Allergies  Allergies  Allergen Reactions  . No Known Allergies     History of Present Illness    82 y/o male with the above complex problem list.  He has a h/o chronic combined syst/diast CHF, AF on xarelto, HTN, HL, L transudative pleural effusion s/p thoracentesis in 12/2014, anemia of chronic dzs followed by heme-onc, and CKD III-IV.  He was last seen in CHF clinic in June @ which time he was reportedly doing well.  Per his wife however, he has been noncompliant with all of his medicines for several months - taking nothing.  His wife checks his BP twice daily and notes that it is always high - often in the 180's to 200's.  Regardless, he refuses to take his meds.  He is also not taking xarelto.  Fortunately, he has not been having any cardiac issues.  He denies chest pain, palpitations, dyspnea, pnd, orthopnea, n, v, dizziness, syncope, edema, weight gain, or early satiety.  Over the past three days, he has been having pain in his neck and shoulders that is worse with palpation and position changes.  He thinks he slept funny the other night.  Home Medications    Prior to Admission medications   Medication Sig Start Date End Date Taking? Authorizing Provider  aspirin 81 MG tablet Take 81 mg by mouth daily.    Historical Provider, MD  cloNIDine (CATAPRES) 0.1 MG tablet Take 1 tablet (0.1 mg total) by mouth 2 (two) times daily. 09/20/15   Alisa Graff, FNP  docusate sodium (COLACE) 100 MG capsule Take 100 mg by mouth 2 (two) times daily. Reported on 06/21/2015    Historical Provider, MD  enalapril (VASOTEC) 5 MG tablet Take 1 tablet (5  mg total) by mouth daily. 09/20/15   Alisa Graff, FNP  Ferrous Sulfate (IRON) 325 (65 FE) MG TABS Take 1 tablet by mouth daily. Reported on 06/21/2015    Historical Provider, MD  glipiZIDE (GLUCOTROL XL) 10 MG 24 hr tablet Take 1 tablet (10 mg total) by mouth 2 (two) times daily. 09/20/15   Alisa Graff, FNP  hydrALAZINE (APRESOLINE) 100 MG tablet Take 1 tablet (100 mg total) by mouth 3 (three) times daily. 09/20/15   Alisa Graff, FNP  Insulin Lispro Prot & Lispro (HUMALOG MIX 75/25 [place]) Inject 75 Units into the skin 2 (two) times daily.     Historical Provider, MD  metoprolol (LOPRESSOR) 50 MG tablet Take 1 tablet (50 mg total) by mouth 2 (two) times daily. 09/20/15   Alisa Graff, FNP  omeprazole  (PRILOSEC) 40 MG capsule Take 1 capsule (40 mg total) by mouth daily. 09/20/15   Alisa Graff, FNP  pravastatin (PRAVACHOL) 40 MG tablet Take 2 tablets (80 mg total) by mouth daily. Take two tablets daily. 09/20/15   Alisa Graff, FNP  Rivaroxaban (XARELTO) 15 MG TABS tablet Take 1 tablet (15 mg total) by mouth daily. 05/05/12   Minna Merritts, MD  torsemide (DEMADEX) 20 MG tablet TAKE ONE TABLET BY MOUTH TWICE DAILY AS NEEDED Patient taking differently: 20 mg daily as needed.  03/21/14   Minna Merritts, MD    Review of Systems    Neck and shoulder discomfort over the past three days.  He denies chest pain, palpitations, dyspnea, pnd, orthopnea, n, v, dizziness, syncope, edema, weight gain, or early satiety.  All other systems reviewed and are otherwise negative except as noted above.  Physical Exam    VS:  BP (!) 195/120 (BP Location: Right Arm, Patient Position: Sitting, Cuff Size: Normal)   Pulse 98   Ht 5\' 8"  (1.727 m)   Wt 191 lb 8 oz (86.9 kg)   BMI 29.12 kg/m  , BMI Body mass index is 29.12 kg/m. GEN: Well nourished, well developed, in no acute distress.  HEENT: normal.  Neck: Supple, no JVD, carotid bruits, or masses. Cardiac: IR, IR, no murmurs, rubs, or gallops. No clubbing, cyanosis, edema.  Radials/DP/PT 2+ and equal bilaterally.  Respiratory:  Respirations regular and unlabored, clear to auscultation bilaterally. GI: Soft, nontender, nondistended, BS + x 4. MS: no deformity or atrophy. Skin: warm and dry, no rash. Neuro:  Strength and sensation are intact. Psych: Normal affect.  Accessory Clinical Findings    ECG - Afib, 94, leftward axis, poor r progression, non-specific st changes.  Assessment & Plan    1.  Malignant Hypertension:  Pt presents to clinic today for routine f/u but is markedly hypertensive.  He admits to not taking any of his medications over the past few months.  He doesn't have a specific reason as to why.  His wife and son are with him today.   We discussed the importance of medication compliance and the risks associated with persistently elevated blood pressures.  I recommended that he present to the ED for evaluation and BP mgmt however he prefers to take his home medications now instead.  While in the office, he took a dose of clonidine, enalapril, and metoprolol.  His wife will follow his BP @ home, as she has been, and will contact us if his pressures continue to run high despite compliance.  Very difficult situation.  2.  Chronic combined syst/diast CHF:  EF  40-45% by echo in 2016.  Despite noncompliance, volume status is stable.  See #1.  Resume home meds.  3.  Permanent Afib:  Rate controlled off of  blocker.  Also not taking xarelto.  Again, I reiterated the risks of noncompliance, specifically CVA and worsening CHF.    4.  Noncompliance:  See above.  This is the biggest issue today.  5.  Dispo:  Pt not willing to go to ER for eval.  He says he will consider taking home meds going forward.  Wife will call with BP readings.  F/u in 2-4 wks.   Murray Hodgkins, NP 11/13/2015, 2:37 PM

## 2015-11-13 NOTE — Patient Instructions (Addendum)
Medication Instructions:  Your physician recommends that you continue on your current medications as directed. Please refer to the Current Medication list given to you today.   Labwork: none  Testing/Procedures: none  Follow-Up: Your physician wants you to follow-up in: one month with Dr. [name] or Christell Faith, PA-C.  You will receive a reminder letter in the mail two months in advance. If you don't receive a letter, please call our office to schedule the follow-up appointment.   Any Other Special Instructions Will Be Listed Below (If Applicable).     If you need a refill on your cardiac medications before your next appointment, please call your pharmacy.

## 2015-11-14 ENCOUNTER — Encounter: Payer: Self-pay | Admitting: Emergency Medicine

## 2015-11-14 ENCOUNTER — Emergency Department
Admission: EM | Admit: 2015-11-14 | Discharge: 2015-11-14 | Disposition: A | Discharge status: Home / Self Care | Payer: Commercial Managed Care - HMO | Attending: Emergency Medicine | Admitting: Emergency Medicine

## 2015-11-14 ENCOUNTER — Emergency Department: Payer: Commercial Managed Care - HMO

## 2015-11-14 DIAGNOSIS — M62838 Other muscle spasm: Secondary | ICD-10-CM | POA: Insufficient documentation

## 2015-11-14 DIAGNOSIS — E1122 Type 2 diabetes mellitus with diabetic chronic kidney disease: Secondary | ICD-10-CM | POA: Insufficient documentation

## 2015-11-14 DIAGNOSIS — I5042 Chronic combined systolic (congestive) and diastolic (congestive) heart failure: Secondary | ICD-10-CM | POA: Diagnosis not present

## 2015-11-14 DIAGNOSIS — N189 Chronic kidney disease, unspecified: Secondary | ICD-10-CM | POA: Diagnosis not present

## 2015-11-14 DIAGNOSIS — I130 Hypertensive heart and chronic kidney disease with heart failure and stage 1 through stage 4 chronic kidney disease, or unspecified chronic kidney disease: Secondary | ICD-10-CM | POA: Diagnosis not present

## 2015-11-14 DIAGNOSIS — Z794 Long term (current) use of insulin: Secondary | ICD-10-CM | POA: Diagnosis not present

## 2015-11-14 DIAGNOSIS — N183 Chronic kidney disease, stage 3 (moderate): Secondary | ICD-10-CM | POA: Diagnosis not present

## 2015-11-14 DIAGNOSIS — Z79899 Other long term (current) drug therapy: Secondary | ICD-10-CM | POA: Insufficient documentation

## 2015-11-14 DIAGNOSIS — Z7982 Long term (current) use of aspirin: Secondary | ICD-10-CM | POA: Diagnosis not present

## 2015-11-14 DIAGNOSIS — J40 Bronchitis, not specified as acute or chronic: Secondary | ICD-10-CM | POA: Diagnosis not present

## 2015-11-14 DIAGNOSIS — R0602 Shortness of breath: Secondary | ICD-10-CM

## 2015-11-14 DIAGNOSIS — I129 Hypertensive chronic kidney disease with stage 1 through stage 4 chronic kidney disease, or unspecified chronic kidney disease: Secondary | ICD-10-CM | POA: Diagnosis not present

## 2015-11-14 DIAGNOSIS — Z87891 Personal history of nicotine dependence: Secondary | ICD-10-CM | POA: Diagnosis not present

## 2015-11-14 LAB — CBC WITH DIFFERENTIAL/PLATELET
Basophils Absolute: 0.1 10*3/uL (ref 0–0.1)
Basophils Relative: 1 %
Eosinophils Absolute: 0.4 10*3/uL (ref 0–0.7)
Eosinophils Relative: 4 %
HEMATOCRIT: 35.4 % — AB (ref 40.0–52.0)
Hemoglobin: 12.3 g/dL — ABNORMAL LOW (ref 13.0–18.0)
LYMPHS ABS: 0.9 10*3/uL — AB (ref 1.0–3.6)
LYMPHS PCT: 9 %
MCH: 31.6 pg (ref 26.0–34.0)
MCHC: 34.6 g/dL (ref 32.0–36.0)
MCV: 91.1 fL (ref 80.0–100.0)
MONO ABS: 0.5 10*3/uL (ref 0.2–1.0)
MONOS PCT: 6 %
NEUTROS ABS: 7.3 10*3/uL — AB (ref 1.4–6.5)
Neutrophils Relative %: 80 %
Platelets: 244 10*3/uL (ref 150–440)
RBC: 3.89 MIL/uL — ABNORMAL LOW (ref 4.40–5.90)
RDW: 14.6 % — AB (ref 11.5–14.5)
WBC: 9.2 10*3/uL (ref 3.8–10.6)

## 2015-11-14 LAB — BASIC METABOLIC PANEL
ANION GAP: 7 (ref 5–15)
BUN: 37 mg/dL — ABNORMAL HIGH (ref 6–20)
CO2: 29 mmol/L (ref 22–32)
Calcium: 9.1 mg/dL (ref 8.9–10.3)
Chloride: 102 mmol/L (ref 101–111)
Creatinine, Ser: 1.93 mg/dL — ABNORMAL HIGH (ref 0.61–1.24)
GFR calc Af Amer: 36 mL/min — ABNORMAL LOW (ref >60)
GFR calc non Af Amer: 31 mL/min — ABNORMAL LOW (ref >60)
GLUCOSE: 159 mg/dL — AB (ref 65–99)
POTASSIUM: 3.2 mmol/L — AB (ref 3.5–5.1)
Sodium: 138 mmol/L (ref 135–145)

## 2015-11-14 LAB — TROPONIN I
Troponin I: 0.04 ng/mL (ref <0.03)
Troponin I: 0.03 ng/mL (ref <0.03)

## 2015-11-14 MED ORDER — METHYLPREDNISOLONE SODIUM SUCC 125 MG IJ SOLR
125.0000 mg | Freq: Once | INTRAMUSCULAR | Status: AC
  Administered 2015-11-14: 125 mg via INTRAVENOUS
  Filled 2015-11-14: qty 2

## 2015-11-14 MED ORDER — AZITHROMYCIN 250 MG PO TABS: ORAL_TABLET | ORAL | 0 refills | Status: DC

## 2015-11-14 MED ORDER — PREDNISONE 20 MG PO TABS: 20.0000 mg | ORAL_TABLET | Freq: Every day | ORAL | 0 refills | Status: DC

## 2015-11-14 MED ORDER — ASPIRIN 81 MG PO CHEW
324.0000 mg | CHEWABLE_TABLET | Freq: Once | ORAL | Status: AC
  Administered 2015-11-14: 324 mg via ORAL
  Filled 2015-11-14: qty 4

## 2015-11-14 MED ORDER — IPRATROPIUM-ALBUTEROL 0.5-2.5 (3) MG/3ML IN SOLN
3.0000 mL | Freq: Once | RESPIRATORY_TRACT | Status: AC
  Administered 2015-11-14: 3 mL via RESPIRATORY_TRACT
  Filled 2015-11-14: qty 3

## 2015-11-14 MED ORDER — ALBUTEROL SULFATE (2.5 MG/3ML) 0.083% IN NEBU
5.0000 mg | INHALATION_SOLUTION | Freq: Once | RESPIRATORY_TRACT | Status: AC
  Administered 2015-11-14: 5 mg via RESPIRATORY_TRACT
  Filled 2015-11-14: qty 6

## 2015-11-14 NOTE — ED Triage Notes (Signed)
Pt in via POV from home with complaints of shortness of breath since this morning.  Pt also reports neck pain to left side since Sunday, states "I slept on it wrong, I reckon."  Pt denies any chest pain, pt sats 99% on room air.  Pt reports seeing cardiologist yesterday for routine appointment with positive feedback.  Pt A/Ox4, vitals WDL, no immediate distress at this time.

## 2015-11-14 NOTE — ED Notes (Signed)
Patient transported to X-ray 

## 2015-11-14 NOTE — ED Notes (Signed)
Patient ambulated in hall and pulse ox remained above 95%.  Tolerated well.

## 2015-11-14 NOTE — ED Notes (Signed)
Pt with multiple bradycardic episodes, down to 39 and 46, heart rate returns to normal.  Pt only complaint at the time is that he feels like he is having trouble breathing.  Pt 98% on room air.  Pt placed on 2L nasal cannula for comfort measures.  MD notified.

## 2015-11-14 NOTE — ED Provider Notes (Signed)
[facility] Emergency Department Provider Note  ____________________________________________  Time seen: Approximately 2:23 PM  I have reviewed the triage vital signs and the nursing notes.   HISTORY  Chief Complaint Shortness of Breath    HPI Patrick [name] is a 82 y.o. male who complains of shortness of breath headache is worse when he walks and lays flat it started this morning when he woke up. No chest pain. No syncope or dizziness. No cough. He saw his cardiologist yesterday. Family report that he is intermittently compliant with his medications. He took all his medicines yesterday in cardiology clinic. No abdominal pain headache or fever.     Past Medical History:  Diagnosis Date  . Amputated finger   . Anemia of chronic disease    a. plan of oncology to start Procrit - received during admission 12/2013  . Atypical chest pain    a. 12/2014 Neg CE - in setting of L pleural effusion.  . Bell's palsy   . Chronic combined systolic and diastolic CHF (congestive heart failure) ([place])    a. 11/2012 Echo: EF 60-65%, mod conc LVH, mildly dil LA/RA, mild Ao sclerosis w/o stenosis; b. 11/2014 Echo: EF 40-45%, prob mid-apicalanteroseptal, ant, apical HK, Gr2 DD, mod dil LA, mildly dil RA (technically difficult study).  . CKD (chronic kidney disease), stage III    a. stage III-IV  . Diabetes mellitus without complication ([employer])   . GERD (gastroesophageal reflux disease)   . History of gastroesophageal reflux (GERD)   . Hyperlipidemia   . Hypertension   . Permanent atrial fibrillation ([place])    a. Dx 12/2011, Rate-controlled, chronic Xarelto (renal dosing) - CHA2DS2VASc = 5.  . Pleural effusion, left    a. 12/2014 s/p thoracentesis - protein <3, LDH 123, no malignancy.     Patient Active Problem List   Diagnosis Date Noted  . CKD (chronic kidney disease), stage III   . CKD (chronic kidney disease) 12/23/2014  . Orthostatic hypotension 08/08/2014  .  Anemia of chronic disease   . Chronic renal disease 11/26/2012  . Chronic diastolic CHF (congestive heart failure) ([place]) 11/26/2012  . A-fib ([place]) 01/15/2012  . Hypertension 01/15/2012  . Hyperlipidemia 01/15/2012  . Diabetes mellitus ([place]) 01/15/2012     Past Surgical History:  Procedure Laterality Date  . APPENDECTOMY    . COLONOSCOPY  09/2011  . ESOPHAGOGASTRODUODENOSCOPY    . FINGER SURGERY     right hand     Prior to Admission medications   Medication Sig Start Date End Date Taking? Authorizing Provider  aspirin 81 MG tablet Take 81 mg by mouth daily.   Yes Historical Provider, MD  Insulin Lispro Prot & Lispro (HUMALOG MIX 75/25 [place]) Inject 50 Units into the skin 2 (two) times daily. Don't take if under 150   Yes Historical Provider, MD  Rivaroxaban (XARELTO) 15 MG TABS tablet Take 1 tablet (15 mg total) by mouth daily. 05/05/12  Yes Minna Merritts, MD  azithromycin (ZITHROMAX Z-PAK) 250 MG tablet Take 2 tablets (500 mg) on  Day 1,  followed by 1 tablet (250 mg) once daily on Days 2 through 5. 11/14/15   Carrie Mew, MD  cloNIDine (CATAPRES) 0.1 MG tablet Take 1 tablet (0.1 mg total) by mouth 2 (two) times daily. 09/20/15   Alisa Graff, FNP  enalapril (VASOTEC) 5 MG tablet Take 1 tablet (5 mg total) by mouth daily. 09/20/15   Alisa Graff, FNP  metoprolol (LOPRESSOR) 50 MG tablet  Take 1 tablet (50 mg total) by mouth 2 (two) times daily. 09/20/15   Alisa Graff, FNP  omeprazole (PRILOSEC) 40 MG capsule Take 1 capsule (40 mg total) by mouth daily. 09/20/15   Alisa Graff, FNP  pravastatin (PRAVACHOL) 40 MG tablet Take 2 tablets (80 mg total) by mouth daily. Take two tablets daily. Patient taking differently: Take 80 mg by mouth daily.  09/20/15   Alisa Graff, FNP  predniSONE (DELTASONE) 20 MG tablet Take 1 tablet (20 mg total) by mouth daily. 11/14/15   Carrie Mew, MD  torsemide (DEMADEX) 20 MG tablet TAKE ONE TABLET BY MOUTH TWICE DAILY AS NEEDED Patient taking  differently: 20 mg daily as needed.  03/21/14   Minna Merritts, MD     Allergies Review of patient's allergies indicates no known allergies.   Family History  Problem Relation Age of Onset  . Heart attack Brother   . Diabetes Mother   . Diabetes Sister   . Hypertension      Social History Social History  Substance Use Topics  . Smoking status: Former Smoker    Packs/day: 0.25    Years: 10.00    Types: Cigars    Quit date: 05/06/1966  . Smokeless tobacco: Never Used  . Alcohol use No    Review of Systems  Constitutional:   No fever or chills.  ENT:   No sore throat. No rhinorrhea. Cardiovascular:   No chest pain. Respiratory:   Positive shortness of breath without cough. Gastrointestinal:   Negative for abdominal pain, vomiting and diarrhea.  Genitourinary:   Negative for dysuria or difficulty urinating. Normal urinary output Musculoskeletal:   Soreness in left neck Neurological:   Negative for headaches 10-point ROS otherwise negative.  ____________________________________________   PHYSICAL EXAM:  VITAL SIGNS: ED Triage Vitals  Enc Vitals Group     BP 11/14/15 0759 123/86     Pulse Rate 11/14/15 0759 68     Resp 11/14/15 0759 (!) 22     Temp 11/14/15 0759 97.3 F (36.3 C)     Temp Source 11/14/15 0759 Oral     SpO2 11/14/15 0759 99 %     Weight 11/14/15 0803 184 lb (83.5 kg)     Height 11/14/15 0803 5\' 8"  (1.727 m)     Head Circumference --      Peak Flow --      Pain Score --      Pain Loc --      Pain Edu? --      Excl. in [place]? --     Vital signs reviewed, nursing assessments reviewed.   Constitutional:   Alert and oriented. Well appearing and in no distress. Eyes:   No scleral icterus. No conjunctival pallor. PERRL. EOMI.  No nystagmus. ENT   Head:   Normocephalic and atraumatic.   Nose:   No congestion/rhinnorhea. No septal hematoma   Mouth/Throat:   MMM, no pharyngeal erythema. No peritonsillar mass.    Neck:   No stridor. No  SubQ emphysema. No meningismus.No JVD Hematological/Lymphatic/Immunilogical:   No cervical lymphadenopathy. Cardiovascular:   RRR. Symmetric bilateral radial and DP pulses.  No murmurs.  Respiratory:   Normal respiratory effort without tachypnea nor retractions. Breath sounds coarse bilaterally without focal findings. No wheezing. Gastrointestinal:   Soft and nontender. Non distended. There is no CVA tenderness.  No rebound, rigidity, or guarding. Genitourinary:   deferred Musculoskeletal:   Nontender with normal range of motion in all extremities.  No joint effusions.  No lower extremity tenderness.  No edema. Neurologic:   Normal speech and language.  CN 2-10 normal. Motor grossly intact. No gross focal neurologic deficits are appreciated.  Skin:    Skin is warm, dry and intact. No rash noted.  No petechiae, purpura, or bullae.  ____________________________________________    LABS (pertinent positives/negatives) (all labs ordered are listed, but only abnormal results are displayed) Labs Reviewed  BASIC METABOLIC PANEL - Abnormal; Notable for the following:       Result Value   Potassium 3.2 (*)    Glucose, Bld 159 (*)    BUN 37 (*)    Creatinine, Ser 1.93 (*)    GFR calc non Af Amer 31 (*)    GFR calc Af Amer 36 (*)    All other components within normal limits  CBC WITH DIFFERENTIAL/PLATELET - Abnormal; Notable for the following:    RBC 3.89 (*)    Hemoglobin 12.3 (*)    HCT 35.4 (*)    RDW 14.6 (*)    Neutro Abs 7.3 (*)    Lymphs Abs 0.9 (*)    All other components within normal limits  TROPONIN I - Abnormal; Notable for the following:    Troponin I 0.04 (*)    All other components within normal limits  TROPONIN I - Abnormal; Notable for the following:    Troponin I 0.03 (*)    All other components within normal limits   ____________________________________________   EKG  Interpreted by me A. fib rate 83, normal axis and intervals. Poor R-wave progression in  anterior precordial leads. Normal ST segments and T waves. No acute ischemic changes.  ____________________________________________    RADIOLOGY  Chest x-ray consistent with COPD, otherwise unremarkable  ____________________________________________   PROCEDURES Procedures  ____________________________________________   INITIAL IMPRESSION / ASSESSMENT AND PLAN / ED COURSE  Pertinent labs & imaging results that were available during my care of the patient were reviewed by me and considered in my medical decision making (see chart for details).  Patient well appearing no acute distress. Presents with shortness of breath without pain. No EKG changes. Check labs and x-ray.     Clinical Course  Value Comment By Time  Creatinine: (!) 1.93 Creatinine at baseline for chronic kidney disease. Troponin 0.04, we'll recheck interval troponin. Chest x-ray consistent with chronic bronchitis and pulmonary fibrosis. We'll treat with Solu-Medrol and DuoNeb's and reassess symptoms. Carrie Mew, MD 08/22 [phone]   Repeat troponin decreased, not consistent with ACS. Ambulated without difficulty. Symptoms improved. We'll discharge home on antibiotics for COPD flare. Carrie Mew, MD 08/22 1343    ----------------------------------------- 2:31 PM on 11/14/2015 -----------------------------------------  Patient be discharged with azithromycin and prednisone, follow up closely with primary care and cardiology. Return precautions given. Education provided to patient and spouse and son at bedside. Family are in agreement and are eager to return home. ____________________________________________   FINAL CLINICAL IMPRESSION(S) / ED DIAGNOSES  Final diagnoses:  SOB (shortness of breath)  Bronchitis  Muscle spasms of neck  CKD (chronic kidney disease), unspecified stage       Portions of this note were generated with dragon dictation software. Dictation errors may occur despite best  attempts at proofreading.    Carrie Mew, MD 11/14/15 [phone]

## 2015-11-20 ENCOUNTER — Emergency Department: Payer: Commercial Managed Care - HMO

## 2015-11-20 ENCOUNTER — Inpatient Hospital Stay
Admission: EM | Admit: 2015-11-20 | Discharge: 2015-11-22 | DRG: 194 | Disposition: A | Discharge status: Home / Self Care | Payer: Commercial Managed Care - HMO | Attending: Specialist | Admitting: Specialist

## 2015-11-20 ENCOUNTER — Encounter: Payer: Self-pay | Admitting: Emergency Medicine

## 2015-11-20 DIAGNOSIS — I4819 Other persistent atrial fibrillation: Secondary | ICD-10-CM | POA: Diagnosis present

## 2015-11-20 DIAGNOSIS — D638 Anemia in other chronic diseases classified elsewhere: Secondary | ICD-10-CM | POA: Diagnosis present

## 2015-11-20 DIAGNOSIS — E1122 Type 2 diabetes mellitus with diabetic chronic kidney disease: Secondary | ICD-10-CM | POA: Diagnosis not present

## 2015-11-20 DIAGNOSIS — N189 Chronic kidney disease, unspecified: Secondary | ICD-10-CM | POA: Diagnosis present

## 2015-11-20 DIAGNOSIS — K219 Gastro-esophageal reflux disease without esophagitis: Secondary | ICD-10-CM | POA: Diagnosis present

## 2015-11-20 DIAGNOSIS — Z79899 Other long term (current) drug therapy: Secondary | ICD-10-CM | POA: Diagnosis not present

## 2015-11-20 DIAGNOSIS — N179 Acute kidney failure, unspecified: Secondary | ICD-10-CM | POA: Diagnosis not present

## 2015-11-20 DIAGNOSIS — I130 Hypertensive heart and chronic kidney disease with heart failure and stage 1 through stage 4 chronic kidney disease, or unspecified chronic kidney disease: Secondary | ICD-10-CM | POA: Diagnosis not present

## 2015-11-20 DIAGNOSIS — Z7901 Long term (current) use of anticoagulants: Secondary | ICD-10-CM

## 2015-11-20 DIAGNOSIS — Z7982 Long term (current) use of aspirin: Secondary | ICD-10-CM | POA: Diagnosis not present

## 2015-11-20 DIAGNOSIS — Z8249 Family history of ischemic heart disease and other diseases of the circulatory system: Secondary | ICD-10-CM | POA: Diagnosis not present

## 2015-11-20 DIAGNOSIS — I10 Essential (primary) hypertension: Secondary | ICD-10-CM | POA: Diagnosis present

## 2015-11-20 DIAGNOSIS — Z87891 Personal history of nicotine dependence: Secondary | ICD-10-CM

## 2015-11-20 DIAGNOSIS — E119 Type 2 diabetes mellitus without complications: Secondary | ICD-10-CM

## 2015-11-20 DIAGNOSIS — I5042 Chronic combined systolic (congestive) and diastolic (congestive) heart failure: Secondary | ICD-10-CM | POA: Diagnosis present

## 2015-11-20 DIAGNOSIS — Z833 Family history of diabetes mellitus: Secondary | ICD-10-CM

## 2015-11-20 DIAGNOSIS — R0602 Shortness of breath: Secondary | ICD-10-CM | POA: Diagnosis not present

## 2015-11-20 DIAGNOSIS — I482 Chronic atrial fibrillation: Secondary | ICD-10-CM | POA: Diagnosis present

## 2015-11-20 DIAGNOSIS — E785 Hyperlipidemia, unspecified: Secondary | ICD-10-CM | POA: Diagnosis present

## 2015-11-20 DIAGNOSIS — I4891 Unspecified atrial fibrillation: Secondary | ICD-10-CM | POA: Diagnosis not present

## 2015-11-20 DIAGNOSIS — Z794 Long term (current) use of insulin: Secondary | ICD-10-CM | POA: Diagnosis not present

## 2015-11-20 DIAGNOSIS — J189 Pneumonia, unspecified organism: Principal | ICD-10-CM | POA: Diagnosis present

## 2015-11-20 DIAGNOSIS — N183 Chronic kidney disease, stage 3 (moderate): Secondary | ICD-10-CM | POA: Diagnosis not present

## 2015-11-20 LAB — COMPREHENSIVE METABOLIC PANEL
ALK PHOS: 66 U/L (ref 38–126)
ALT: 20 U/L (ref 17–63)
AST: 22 U/L (ref 15–41)
Albumin: 3.3 g/dL — ABNORMAL LOW (ref 3.5–5.0)
Anion gap: 7 (ref 5–15)
BILIRUBIN TOTAL: 0.7 mg/dL (ref 0.3–1.2)
BUN: 52 mg/dL — AB (ref 6–20)
CALCIUM: 8.9 mg/dL (ref 8.9–10.3)
CO2: 27 mmol/L (ref 22–32)
CREATININE: 2.15 mg/dL — AB (ref 0.61–1.24)
Chloride: 107 mmol/L (ref 101–111)
GFR, EST AFRICAN AMERICAN: 31 mL/min — AB (ref >60)
GFR, EST NON AFRICAN AMERICAN: 27 mL/min — AB (ref >60)
Glucose, Bld: 219 mg/dL — ABNORMAL HIGH (ref 65–99)
Potassium: 3.3 mmol/L — ABNORMAL LOW (ref 3.5–5.1)
Sodium: 141 mmol/L (ref 135–145)
TOTAL PROTEIN: 7 g/dL (ref 6.5–8.1)

## 2015-11-20 LAB — CBC
HEMATOCRIT: 38.5 % — AB (ref 40.0–52.0)
Hemoglobin: 12.9 g/dL — ABNORMAL LOW (ref 13.0–18.0)
MCH: 31.2 pg (ref 26.0–34.0)
MCHC: 33.4 g/dL (ref 32.0–36.0)
MCV: 93.3 fL (ref 80.0–100.0)
Platelets: 254 10*3/uL (ref 150–440)
RBC: 4.13 MIL/uL — AB (ref 4.40–5.90)
RDW: 15 % — ABNORMAL HIGH (ref 11.5–14.5)
WBC: 9.6 10*3/uL (ref 3.8–10.6)

## 2015-11-20 LAB — GLUCOSE, CAPILLARY: Glucose-Capillary: 122 mg/dL — ABNORMAL HIGH (ref 65–99)

## 2015-11-20 MED ORDER — HEPARIN (PORCINE) IN NACL 100-0.45 UNIT/ML-% IJ SOLN
1100.0000 [IU]/h | INTRAMUSCULAR | Status: DC
  Administered 2015-11-21: 1100 [IU]/h via INTRAVENOUS
  Filled 2015-11-20: qty 250

## 2015-11-20 MED ORDER — LABETALOL HCL 5 MG/ML IV SOLN
10.0000 mg | INTRAVENOUS | Status: DC | PRN
  Filled 2015-11-20: qty 4

## 2015-11-20 MED ORDER — ENALAPRIL MALEATE 5 MG PO TABS
5.0000 mg | ORAL_TABLET | Freq: Every day | ORAL | Status: DC
  Administered 2015-11-21 – 2015-11-22 (×2): 5 mg via ORAL
  Filled 2015-11-20 (×3): qty 1

## 2015-11-20 MED ORDER — ACETAMINOPHEN 650 MG RE SUPP: 650.0000 mg | Freq: Four times a day (QID) | RECTAL | Status: DC | PRN

## 2015-11-20 MED ORDER — ACETAMINOPHEN 325 MG PO TABS: 650.0000 mg | ORAL_TABLET | Freq: Four times a day (QID) | ORAL | Status: DC | PRN

## 2015-11-20 MED ORDER — INSULIN ASPART 100 UNIT/ML ~~LOC~~ SOLN
0.0000 [IU] | Freq: Three times a day (TID) | SUBCUTANEOUS | Status: DC
  Administered 2015-11-21: 3 [IU] via SUBCUTANEOUS
  Administered 2015-11-21 (×2): 2 [IU] via SUBCUTANEOUS
  Administered 2015-11-22: 1 [IU] via SUBCUTANEOUS
  Administered 2015-11-22: 2 [IU] via SUBCUTANEOUS
  Filled 2015-11-20: qty 2
  Filled 2015-11-20: qty 1
  Filled 2015-11-20: qty 3
  Filled 2015-11-20 (×2): qty 2

## 2015-11-20 MED ORDER — LEVOFLOXACIN IN D5W 750 MG/150ML IV SOLN
750.0000 mg | Freq: Once | INTRAVENOUS | Status: AC
  Administered 2015-11-20: 750 mg via INTRAVENOUS
  Filled 2015-11-20: qty 150

## 2015-11-20 MED ORDER — ONDANSETRON HCL 4 MG PO TABS: 4.0000 mg | ORAL_TABLET | Freq: Four times a day (QID) | ORAL | Status: DC | PRN

## 2015-11-20 MED ORDER — IPRATROPIUM-ALBUTEROL 0.5-2.5 (3) MG/3ML IN SOLN
3.0000 mL | RESPIRATORY_TRACT | Status: DC
  Administered 2015-11-21 (×2): 3 mL via RESPIRATORY_TRACT
  Filled 2015-11-20 (×2): qty 3

## 2015-11-20 MED ORDER — SODIUM CHLORIDE 0.9% FLUSH
3.0000 mL | Freq: Two times a day (BID) | INTRAVENOUS | Status: DC
  Administered 2015-11-20 – 2015-11-22 (×4): 3 mL via INTRAVENOUS

## 2015-11-20 MED ORDER — CLONIDINE HCL 0.1 MG PO TABS
0.1000 mg | ORAL_TABLET | Freq: Two times a day (BID) | ORAL | Status: DC
  Administered 2015-11-20 – 2015-11-22 (×4): 0.1 mg via ORAL
  Filled 2015-11-20 (×4): qty 1

## 2015-11-20 MED ORDER — LEVOFLOXACIN IN D5W 750 MG/150ML IV SOLN: 750.0000 mg | INTRAVENOUS | Status: DC

## 2015-11-20 MED ORDER — PREDNISONE 20 MG PO TABS
20.0000 mg | ORAL_TABLET | Freq: Every day | ORAL | Status: DC
  Administered 2015-11-21: 20 mg via ORAL
  Filled 2015-11-20: qty 1

## 2015-11-20 MED ORDER — OXYCODONE HCL 5 MG PO TABS
5.0000 mg | ORAL_TABLET | ORAL | Status: DC | PRN
  Administered 2015-11-21 (×3): 5 mg via ORAL
  Filled 2015-11-20 (×3): qty 1

## 2015-11-20 MED ORDER — PANTOPRAZOLE SODIUM 40 MG PO TBEC
40.0000 mg | DELAYED_RELEASE_TABLET | Freq: Every day | ORAL | Status: DC
  Administered 2015-11-21 – 2015-11-22 (×2): 40 mg via ORAL
  Filled 2015-11-20 (×2): qty 1

## 2015-11-20 MED ORDER — METOPROLOL TARTRATE 50 MG PO TABS
50.0000 mg | ORAL_TABLET | Freq: Two times a day (BID) | ORAL | Status: DC
  Administered 2015-11-20 – 2015-11-22 (×4): 50 mg via ORAL
  Filled 2015-11-20 (×4): qty 1

## 2015-11-20 MED ORDER — ONDANSETRON HCL 4 MG/2ML IJ SOLN: 4.0000 mg | Freq: Four times a day (QID) | INTRAMUSCULAR | Status: DC | PRN

## 2015-11-20 MED ORDER — INSULIN ASPART 100 UNIT/ML ~~LOC~~ SOLN
0.0000 [IU] | Freq: Every day | SUBCUTANEOUS | Status: DC
  Administered 2015-11-21: 4 [IU] via SUBCUTANEOUS
  Filled 2015-11-20: qty 4

## 2015-11-20 MED ORDER — PRAVASTATIN SODIUM 40 MG PO TABS
80.0000 mg | ORAL_TABLET | Freq: Every day | ORAL | Status: DC
  Administered 2015-11-20 – 2015-11-21 (×2): 80 mg via ORAL
  Filled 2015-11-20 (×2): qty 2

## 2015-11-20 MED ORDER — TORSEMIDE 20 MG PO TABS
20.0000 mg | ORAL_TABLET | Freq: Every day | ORAL | Status: DC
  Administered 2015-11-21 – 2015-11-22 (×2): 20 mg via ORAL
  Filled 2015-11-20 (×2): qty 1

## 2015-11-20 MED ORDER — ASPIRIN EC 81 MG PO TBEC
81.0000 mg | DELAYED_RELEASE_TABLET | Freq: Every day | ORAL | Status: DC
Start: 2015-11-21 — End: 2015-11-22
  Administered 2015-11-21 – 2015-11-22 (×2): 81 mg via ORAL
  Filled 2015-11-20 (×2): qty 1

## 2015-11-20 MED ORDER — HEPARIN BOLUS VIA INFUSION
4000.0000 [IU] | Freq: Once | INTRAVENOUS | Status: AC
  Administered 2015-11-21: 4000 [IU] via INTRAVENOUS
  Filled 2015-11-20: qty 4000

## 2015-11-20 NOTE — ED Triage Notes (Signed)
Pt presents with shortness of breath started a day or two ago. Was seen last week and dx with bronchitis put prednisone and is not helping.

## 2015-11-20 NOTE — Progress Notes (Signed)
Pharmacy Antibiotic Note  Patrick [name] is a 82 y.o. male admitted on 11/20/2015 with pneumonia.  Pharmacy has been consulted for levofloxacin dosing.  Plan: Levofloxacin 750 mg IV Q48H. Pharmacy will continue to follow and adjust dose based on renal function.   Height: 5\' 5"  (165.1 cm) Weight: 192 lb (87.1 kg) IBW/kg (Calculated) : 61.5  Temp (24hrs), Avg:97.7 F (36.5 C), Min:97.6 F (36.4 C), Max:97.8 F (36.6 C)   Recent Labs Lab 11/14/15 0808 11/20/15 1711  WBC 9.2 9.6  CREATININE 1.93* 2.15*    Estimated Creatinine Clearance: 26.9 mL/min (by C-G formula based on SCr of 2.15 mg/dL).    No Known Allergies   Thank you for allowing pharmacy to be a part of this patient's care.  Laural Benes, Pharm.D., BCPS Clinical Pharmacist 11/20/2015 10:47 PM

## 2015-11-20 NOTE — Progress Notes (Addendum)
ANTICOAGULATION CONSULT NOTE - Initial Consult  Pharmacy Consult for heparin Indication: atrial fibrillation  No Known Allergies  Patient Measurements: Height: 5\' 5"  (165.1 cm) Weight: 192 lb (87.1 kg) IBW/kg (Calculated) : 61.5 Heparin Dosing Weight: 79.9 kg  Vital Signs: Temp: 97.6 F (36.4 C) (08/28 2244) Temp Source: Oral (08/28 2244) BP: 208/85 (08/28 2244) Pulse Rate: 74 (08/28 2244)  Labs:  Recent Labs  11/20/15 1711  HGB 12.9*  HCT 38.5*  PLT 254  CREATININE 2.15*    Estimated Creatinine Clearance: 26.9 mL/min (by C-G formula based on SCr of 2.15 mg/dL).   Medical History: Past Medical History:  Diagnosis Date  . Amputated finger   . Anemia of chronic disease    a. plan of oncology to start Procrit - received during admission 12/2013  . Atypical chest pain    a. 12/2014 Neg CE - in setting of L pleural effusion.  . Bell's palsy   . Chronic combined systolic and diastolic CHF (congestive heart failure) ([place])    a. 11/2012 Echo: EF 60-65%, mod conc LVH, mildly dil LA/RA, mild Ao sclerosis w/o stenosis; b. 11/2014 Echo: EF 40-45%, prob mid-apicalanteroseptal, ant, apical HK, Gr2 DD, mod dil LA, mildly dil RA (technically difficult study).  . CKD (chronic kidney disease), stage III    a. stage III-IV  . Diabetes mellitus without complication ([place])   . GERD (gastroesophageal reflux disease)   . History of gastroesophageal reflux (GERD)   . Hyperlipidemia   . Hypertension   . Permanent atrial fibrillation ([place])    a. Dx 12/2011, Rate-controlled, chronic Xarelto (renal dosing) - CHA2DS2VASc = 5.  . Pleural effusion, left    a. 12/2014 s/p thoracentesis - protein <3, LDH 123, no malignancy.    Medications:  Infusions:  . [START ON 11/21/2015] heparin      Assessment: 84 yom cc SOB. On rivaroxaban 15 mg po daily PTA for AF. Pharmacy consulted to dose heparin for AF. Per protocol, wait 24 hours to start parenteral AC for CrCl < 30 mL/min. Patient unable to  tell us last dose time, but PTA lists 8/28 at 0545 - will start heparin drip at 0545 on 11/21/15. Baseline labs ordered.  Goal of Therapy:  Heparin level 0.3-0.7 units/ml aPTT 66 to 102 s seconds Monitor platelets by anticoagulation protocol: Yes   Plan:  Give 4000 units bolus x 1 Start heparin infusion at 1100 units/hr Check aPTT level in 6 hours and daily while on heparin until aPTT and HL correlate Continue to monitor H&H and platelets  Laural Benes, Pharm.D., BCPS Clinical Pharmacist 11/20/2015,10:57 PM   11/21/2015 0415 baseline heparin level supratherapeutic (needed dilution because sample exceeded linearity, but expired before dilution could be completed). Will send drip to start at 0545, check aPTT 6 hours later, and draw HL at the same time.  Arial Galligan A. [place], [place].D., BCPS Clinical Pharmacist

## 2015-11-20 NOTE — ED Provider Notes (Signed)
[facility] Emergency Department Provider Note    ____________________________________________   I have reviewed the triage vital signs and the nursing notes.   HISTORY  Chief Complaint Shortness of Breath   History limited by: Not Limited   HPI Patrick [name] is a 82 y.o. male who presents to the emergency department today because of concerns for shortness breath and cough. Patient states it is gotten worse over the past 2 days. He was seen in the emergency department 6 days ago for shortness of breath as well. That time he had a workup which did not show any overly concerning findings. He was diagnosed with bronchitis. He states he has been taking the medications prescribed to him however continued to feel worse today. Has had a cough productive of a small amount of phlegm. Denies any fevers. Denies any nausea or vomiting. No chest pain.   Past Medical History:  Diagnosis Date  . Amputated finger   . Anemia of chronic disease    a. plan of oncology to start Procrit - received during admission 12/2013  . Atypical chest pain    a. 12/2014 Neg CE - in setting of L pleural effusion.  . Bell's palsy   . Chronic combined systolic and diastolic CHF (congestive heart failure) ([place])    a. 11/2012 Echo: EF 60-65%, mod conc LVH, mildly dil LA/RA, mild Ao sclerosis w/o stenosis; b. 11/2014 Echo: EF 40-45%, prob mid-apicalanteroseptal, ant, apical HK, Gr2 DD, mod dil LA, mildly dil RA (technically difficult study).  . CKD (chronic kidney disease), stage III    a. stage III-IV  . Diabetes mellitus without complication ([place])   . GERD (gastroesophageal reflux disease)   . History of gastroesophageal reflux (GERD)   . Hyperlipidemia   . Hypertension   . Permanent atrial fibrillation ([place])    a. Dx 12/2011, Rate-controlled, chronic Xarelto (renal dosing) - CHA2DS2VASc = 5.  . Pleural effusion, left    a. 12/2014 s/p thoracentesis - protein <3, LDH 123, no  malignancy.    Patient Active Problem List   Diagnosis Date Noted  . CKD (chronic kidney disease), stage III   . CKD (chronic kidney disease) 12/23/2014  . Orthostatic hypotension 08/08/2014  . Anemia of chronic disease   . Chronic renal disease 11/26/2012  . Chronic diastolic CHF (congestive heart failure) ([place]) 11/26/2012  . A-fib ([place]) 01/15/2012  . Hypertension 01/15/2012  . Hyperlipidemia 01/15/2012  . Diabetes mellitus ([place]) 01/15/2012    Past Surgical History:  Procedure Laterality Date  . APPENDECTOMY    . COLONOSCOPY  09/2011  . ESOPHAGOGASTRODUODENOSCOPY    . FINGER SURGERY     right hand    Prior to Admission medications   Medication Sig Start Date End Date Taking? Authorizing Provider  aspirin 81 MG tablet Take 81 mg by mouth daily.    Historical Provider, MD  azithromycin (ZITHROMAX Z-PAK) 250 MG tablet Take 2 tablets (500 mg) on  Day 1,  followed by 1 tablet (250 mg) once daily on Days 2 through 5. 11/14/15   Carrie Mew, MD  cloNIDine (CATAPRES) 0.1 MG tablet Take 1 tablet (0.1 mg total) by mouth 2 (two) times daily. 09/20/15   Alisa Graff, FNP  enalapril (VASOTEC) 5 MG tablet Take 1 tablet (5 mg total) by mouth daily. 09/20/15   Alisa Graff, FNP  Insulin Lispro Prot & Lispro (HUMALOG MIX 75/25 [place]) Inject 50 Units into the skin 2 (two) times daily. Don't take if under  150    Historical Provider, MD  metoprolol (LOPRESSOR) 50 MG tablet Take 1 tablet (50 mg total) by mouth 2 (two) times daily. 09/20/15   Alisa Graff, FNP  omeprazole (PRILOSEC) 40 MG capsule Take 1 capsule (40 mg total) by mouth daily. 09/20/15   Alisa Graff, FNP  pravastatin (PRAVACHOL) 40 MG tablet Take 2 tablets (80 mg total) by mouth daily. Take two tablets daily. Patient taking differently: Take 80 mg by mouth daily.  09/20/15   Alisa Graff, FNP  predniSONE (DELTASONE) 20 MG tablet Take 1 tablet (20 mg total) by mouth daily. 11/14/15   Carrie Mew, MD  Rivaroxaban (XARELTO) 15  MG TABS tablet Take 1 tablet (15 mg total) by mouth daily. 05/05/12   Minna Merritts, MD  torsemide (DEMADEX) 20 MG tablet TAKE ONE TABLET BY MOUTH TWICE DAILY AS NEEDED Patient taking differently: 20 mg daily as needed.  03/21/14   Minna Merritts, MD    Allergies Review of patient's allergies indicates no known allergies.  Family History  Problem Relation Age of Onset  . Heart attack Brother   . Diabetes Mother   . Diabetes Sister   . Hypertension      Social History Social History  Substance Use Topics  . Smoking status: Former Smoker    Packs/day: 0.25    Years: 10.00    Types: Cigars    Quit date: 05/06/1966  . Smokeless tobacco: Never Used  . Alcohol use No    Review of Systems  Constitutional: Negative for fever. Cardiovascular: Negative for chest pain. Respiratory: Positive for shortness of breath. Gastrointestinal: Negative for abdominal pain, vomiting and diarrhea. Neurological: Negative for headaches, focal weakness or numbness.  10-point ROS otherwise negative.  ____________________________________________   PHYSICAL EXAM:  VITAL SIGNS: ED Triage Vitals  Enc Vitals Group     BP 11/20/15 1704 (!) 155/86     Pulse Rate 11/20/15 1704 76     Resp 11/20/15 1704 18     Temp 11/20/15 1704 97.8 F (36.6 C)     Temp Source 11/20/15 1704 Oral     SpO2 11/20/15 1704 97 %     Weight 11/20/15 1705 192 lb (87.1 kg)     Height 11/20/15 1705 5\' 5"  (1.651 m)     Head Circumference --      Peak Flow --      Pain Score 11/20/15 1904 0   Constitutional: Alert and oriented. Appears in mild respiratory distress. Eyes: Conjunctivae are normal. PERRL. Normal extraocular movements. ENT   Head: Normocephalic and atraumatic.   Nose: No congestion/rhinnorhea.   Mouth/Throat: Mucous membranes are moist.   Neck: No stridor. Hematological/Lymphatic/Immunilogical: No cervical lymphadenopathy. Cardiovascular: Irregularly irregular rhythm.  No murmurs, rubs,  or gallops. Respiratory: Increased respiratory effort. Tachypnea. Mild expiratory wheezing bilaterally. Gastrointestinal: Soft and nontender. No distention.  Genitourinary: Deferred Musculoskeletal: Normal range of motion in all extremities. No joint effusions.  No lower extremity tenderness nor edema. Neurologic:  Normal speech and language. No gross focal neurologic deficits are appreciated.  Skin:  Skin is warm, dry and intact. No rash noted. Psychiatric: Mood and affect are normal. Speech and behavior are normal. Patient exhibits appropriate insight and judgment.  ____________________________________________    LABS (pertinent positives/negatives)  Labs Reviewed  CBC - Abnormal; Notable for the following:       Result Value   RBC 4.13 (*)    Hemoglobin 12.9 (*)    HCT 38.5 (*)  RDW 15.0 (*)    All other components within normal limits  COMPREHENSIVE METABOLIC PANEL - Abnormal; Notable for the following:    Potassium 3.3 (*)    Glucose, Bld 219 (*)    BUN 52 (*)    Creatinine, Ser 2.15 (*)    Albumin 3.3 (*)    GFR calc non Af Amer 27 (*)    GFR calc Af Amer 31 (*)    All other components within normal limits  CULTURE, BLOOD (ROUTINE X 2)  CULTURE, BLOOD (ROUTINE X 2)     ____________________________________________   EKG  I, Nance Pear, attending physician, personally viewed and interpreted this EKG  EKG Time: 1712 Rate: 72 Rhythm: atrial fibrillation Axis: normal Intervals: qtc 431 QRS: narrow, q waves v1, v2 ST changes: no st elevation Impression: abnormal ekg   ____________________________________________    RADIOLOGY  CXR IMPRESSION:  1. The appearance the chest is concerning for possible multilobar  bronchopneumonia, as above.  2. Areas of chronic pleuroparenchymal scarring, in addition to trace  left pleural effusion, which appears to be chronic.  3. Widened mediastinal contours, similar to prior examinations,  presumably related to  underlying lymphadenopathy (better  demonstrated on prior chest CT 12/22/2014). Clinical correlation for  signs and symptoms of underlying lymphoproliferative disorder is  recommended.     ____________________________________________   PROCEDURES  Procedures  ____________________________________________   INITIAL IMPRESSION / ASSESSMENT AND PLAN / ED COURSE  Pertinent labs & imaging results that were available during my care of the patient were reviewed by me and considered in my medical decision making (see chart for details).  Patient presented to the emergency department today because of concerns for increasing continued shortness breath and cough. Chest x-ray today is concerning for pneumonia. Will plan on giving patient antibiotics here and admission to the hospitalist service.  ____________________________________________   FINAL CLINICAL IMPRESSION(S) / ED DIAGNOSES  Pneumonia  Note: This dictation was prepared with Dragon dictation. Any transcriptional errors that result from this process are unintentional    Nance Pear, MD 11/20/15 2017

## 2015-11-20 NOTE — H&P (Signed)
Patrick [name] NAME: [name]    MR#:  [id number]  DATE OF BIRTH:  1931-08-29  DATE OF ADMISSION:  11/20/2015  PRIMARY CARE PHYSICIAN: Maryland Pink, MD   REQUESTING/REFERRING PHYSICIAN: Archie Balboa, MD  CHIEF COMPLAINT:   Chief Complaint  Patient presents with  . Shortness of Breath    HISTORY OF PRESENT ILLNESS:  Patrick [name]  is a 82 y.o. male who presents with Persistent shortness of breath and increased sputum production. Patient states that he has been having these symptoms over the past couple of weeks. He was given a course of antibiotics for bronchitis, and states that he began to feel somewhat better, but as soon as he stopped taking antibiotics he began to feel worse again. In the ED he was found on imaging to have patchy opacities consistent with multifocal pneumonia, and hospitalists were called for admission  PAST MEDICAL HISTORY:   Past Medical History:  Diagnosis Date  . Amputated finger   . Anemia of chronic disease    a. plan of oncology to start Procrit - received during admission 12/2013  . Atypical chest pain    a. 12/2014 Neg CE - in setting of L pleural effusion.  . Bell's palsy   . Chronic combined systolic and diastolic CHF (congestive heart failure) ([place])    a. 11/2012 Echo: EF 60-65%, mod conc LVH, mildly dil LA/RA, mild Ao sclerosis w/o stenosis; b. 11/2014 Echo: EF 40-45%, prob mid-apicalanteroseptal, ant, apical HK, Gr2 DD, mod dil LA, mildly dil RA (technically difficult study).  . CKD (chronic kidney disease), stage III    a. stage III-IV  . Diabetes mellitus without complication ([place])   . GERD (gastroesophageal reflux disease)   . History of gastroesophageal reflux (GERD)   . Hyperlipidemia   . Hypertension   . Permanent atrial fibrillation ([place])    a. Dx 12/2011, Rate-controlled, chronic Xarelto (renal dosing) - CHA2DS2VASc = 5.  . Pleural effusion, left    a. 12/2014  s/p thoracentesis - protein <3, LDH 123, no malignancy.    PAST SURGICAL HISTORY:   Past Surgical History:  Procedure Laterality Date  . APPENDECTOMY    . COLONOSCOPY  09/2011  . ESOPHAGOGASTRODUODENOSCOPY    . FINGER SURGERY     right hand    SOCIAL HISTORY:   Social History  Substance Use Topics  . Smoking status: Former Smoker    Packs/day: 0.25    Years: 10.00    Types: Cigars    Quit date: 05/06/1966  . Smokeless tobacco: Never Used  . Alcohol use No    FAMILY HISTORY:   Family History  Problem Relation Age of Onset  . Heart attack Brother   . Diabetes Mother   . Diabetes Sister   . Hypertension      DRUG ALLERGIES:  No Known Allergies  MEDICATIONS AT HOME:   Prior to Admission medications   Medication Sig Start Date End Date Taking? Authorizing Provider  aspirin 81 MG tablet Take 81 mg by mouth daily.   Yes Historical Provider, MD  cloNIDine (CATAPRES) 0.1 MG tablet Take 1 tablet (0.1 mg total) by mouth 2 (two) times daily. 09/20/15  Yes Alisa Graff, FNP  enalapril (VASOTEC) 5 MG tablet Take 1 tablet (5 mg total) by mouth daily. 09/20/15  Yes Alisa Graff, FNP  Insulin Lispro Prot & Lispro (HUMALOG MIX 75/25 [place]) Inject 50 Units into the skin 2 (two) times daily. Don't  take if under 150   Yes Historical Provider, MD  metoprolol (LOPRESSOR) 50 MG tablet Take 1 tablet (50 mg total) by mouth 2 (two) times daily. 09/20/15  Yes Alisa Graff, FNP  omeprazole (PRILOSEC) 40 MG capsule Take 1 capsule (40 mg total) by mouth daily. 09/20/15  Yes Alisa Graff, FNP  Rivaroxaban (XARELTO) 15 MG TABS tablet Take 1 tablet (15 mg total) by mouth daily. 05/05/12  Yes Minna Merritts, MD  torsemide (DEMADEX) 20 MG tablet TAKE ONE TABLET BY MOUTH TWICE DAILY AS NEEDED Patient taking differently: 20 mg daily as needed.  03/21/14  Yes Minna Merritts, MD  azithromycin (ZITHROMAX Z-PAK) 250 MG tablet Take 2 tablets (500 mg) on  Day 1,  followed by 1 tablet (250 mg) once daily  on Days 2 through 5. 11/14/15   Carrie Mew, MD  pravastatin (PRAVACHOL) 40 MG tablet Take 2 tablets (80 mg total) by mouth daily. Take two tablets daily. 09/20/15   Alisa Graff, FNP  predniSONE (DELTASONE) 20 MG tablet Take 1 tablet (20 mg total) by mouth daily. 11/14/15   Carrie Mew, MD    REVIEW OF SYSTEMS:  Review of Systems  Constitutional: Positive for malaise/fatigue. Negative for chills, fever and weight loss.  HENT: Negative for ear pain, hearing loss and tinnitus.   Eyes: Negative for blurred vision, double vision, pain and redness.  Respiratory: Positive for sputum production and shortness of breath. Negative for cough and hemoptysis.   Cardiovascular: Negative for chest pain, palpitations, orthopnea and leg swelling.  Gastrointestinal: Negative for abdominal pain, constipation, diarrhea, nausea and vomiting.  Genitourinary: Negative for dysuria, frequency and hematuria.  Musculoskeletal: Negative for back pain, joint pain and neck pain.  Skin:       No acne, rash, or lesions  Neurological: Negative for dizziness, tremors, focal weakness and weakness.  Endo/Heme/Allergies: Negative for polydipsia. Does not bruise/bleed easily.  Psychiatric/Behavioral: Negative for depression. The patient is not nervous/anxious and does not have insomnia.      VITAL SIGNS:   Vitals:   11/20/15 1704 11/20/15 1705  BP: (!) 155/86   Pulse: 76   Resp: 18   Temp: 97.8 F (36.6 C)   TempSrc: Oral   SpO2: 97%   Weight:  87.1 kg (192 lb)  Height:  5\' 5"  (1.651 m)   Wt Readings from Last 3 Encounters:  11/20/15 87.1 kg (192 lb)  11/14/15 83.5 kg (184 lb)  11/13/15 86.9 kg (191 lb 8 oz)    PHYSICAL EXAMINATION:  Physical Exam  Vitals reviewed. Constitutional: He is oriented to person, place, and time. He appears well-developed and well-nourished. No distress.  HENT:  Head: Normocephalic and atraumatic.  Mouth/Throat: Oropharynx is clear and moist.  Eyes: Conjunctivae and  EOM are normal. Pupils are equal, round, and reactive to light. No scleral icterus.  Neck: Normal range of motion. Neck supple. No JVD present. No thyromegaly present.  Cardiovascular: Normal rate, regular rhythm and intact distal pulses.  Exam reveals no gallop and no friction rub.   No murmur heard. Respiratory: He is in respiratory distress (mild, patient is able to speak in full sentences). He has no wheezes. He has no rales.  Patchy coarse breath sounds  GI: Soft. Bowel sounds are normal. He exhibits no distension. There is no tenderness.  Musculoskeletal: Normal range of motion. He exhibits no edema.  No arthritis, no gout  Lymphadenopathy:    He has no cervical adenopathy.  Neurological: He is alert  and oriented to person, place, and time. No cranial nerve deficit.  No dysarthria, no aphasia  Skin: Skin is warm and dry. No rash noted. No erythema.  Psychiatric: He has a normal mood and affect. His behavior is normal. Judgment and thought content normal.    LABORATORY PANEL:   CBC  Recent Labs Lab 11/20/15 1711  WBC 9.6  HGB 12.9*  HCT 38.5*  PLT 254   ------------------------------------------------------------------------------------------------------------------  Chemistries   Recent Labs Lab 11/20/15 1711  NA 141  K 3.3*  CL 107  CO2 27  GLUCOSE 219*  BUN 52*  CREATININE 2.15*  CALCIUM 8.9  AST 22  ALT 20  ALKPHOS 66  BILITOT 0.7   ------------------------------------------------------------------------------------------------------------------  Cardiac Enzymes  Recent Labs Lab 11/14/15 1124  TROPONINI 0.03*   ------------------------------------------------------------------------------------------------------------------  RADIOLOGY:  Dg Chest 2 View  Result Date: 11/20/2015 CLINICAL DATA:  82 year old male with shortness of breath for the past 1-2 days. Recently diagnosis with bronchitis, treated with prednisone, with no relief of symptoms.  EXAM: CHEST  2 VIEW COMPARISON:  Chest x-ray 11/14/2015. FINDINGS: Areas of pleural parenchymal thickening and architectural distortion throughout the mid to lower thorax bilaterally (left greater than right), similar to prior studies, favored to reflect chronic scarring. Trace left pleural effusion appears to be chronic. Diffuse peribronchial cuffing. Ill-defined interstitial and airspace opacities throughout the mid to lower lungs bilaterally, concerning for mild multilobar bronchopneumonia. Heart size is borderline enlarged. No evidence of pulmonary edema. Upper mediastinal contours appear widened with, similar to prior studies, presumably related to underlying lymphadenopathy (as demonstrated on prior chest CT 12/22/2014). Aortic atherosclerosis. IMPRESSION: 1. The appearance the chest is concerning for possible multilobar bronchopneumonia, as above. 2. Areas of chronic pleuroparenchymal scarring, in addition to trace left pleural effusion, which appears to be chronic. 3. Widened mediastinal contours, similar to prior examinations, presumably related to underlying lymphadenopathy (better demonstrated on prior chest CT 12/22/2014). Clinical correlation for signs and symptoms of underlying lymphoproliferative disorder is recommended. Electronically Signed   By: Vinnie Langton M.D.   On: 11/20/2015 17:32    EKG:   Orders placed or performed during the hospital encounter of 11/20/15  . ED EKG  . ED EKG    IMPRESSION AND PLAN:  Principal Problem:   CAP (community acquired pneumonia) - IV antibiotics given in the ED and continued on admission, cultures sent from the ED, sputum culture ordered, when necessary duo nebs Active Problems:   Acute kidney injury superimposed on chronic kidney disease ([place]) - gentle hydration with IV fluids tonight, monitor in the morning with a.m. labs, avoid nephrotoxins   A-fib ([place]) - patient was on Xarelto, but given his acute kidney injury will put him on heparin drip  as his GFR has dropped below 30. Hopefully with improvement in his creatinine he'll be able to restart his Xarelto prior to discharge   Hypertension - stable, continue home meds   Diabetes mellitus ([place]) - sliding scale insulin with corresponding glucose checks and carb modified diet   Chronic combined systolic and diastolic CHF (congestive heart failure) ([place]) - continue home meds, cautious with IV fluids  All the records are reviewed and case discussed with ED provider. Management plans discussed with the patient and/or family.  DVT PROPHYLAXIS: Heparin  GI PROPHYLAXIS: PPI  ADMISSION STATUS: Inpatient  CODE STATUS: Code Status History    Date Active Date Inactive Code Status Order ID Comments User Context   12/23/2014 11:44 AM 12/26/2014  7:40 PM Partial Code [id number]  Hillary Bow, MD Inpatient   12/23/2014  1:27 AM 12/23/2014 11:44 AM Full Code [id number]  Juluis Mire, MD Inpatient    Questions for Most Recent Historical Code Status (Order [id number])    Question Answer Comment   In the event of cardiac or respiratory ARREST: Initiate Code Blue, Call Rapid Response Yes    In the event of cardiac or respiratory ARREST: Perform CPR Yes    In the event of cardiac or respiratory ARREST: Perform Intubation/Mechanical Ventilation No    In the event of cardiac or respiratory ARREST: Use NIPPV/BiPAp only if indicated Yes    In the event of cardiac or respiratory ARREST: Administer ACLS medications if indicated Yes    In the event of cardiac or respiratory ARREST: Perform Defibrillation or Cardioversion if indicated Yes       TOTAL TIME TAKING CARE OF THIS PATIENT: 45 minutes.    Arless Vineyard FIELDING 11/20/2015, 8:59 PM  [facility]  Office  [phone]  CC: Primary care physician; Maryland Pink, MD

## 2015-11-21 LAB — GLUCOSE, CAPILLARY
GLUCOSE-CAPILLARY: 198 mg/dL — AB (ref 65–99)
GLUCOSE-CAPILLARY: 238 mg/dL — AB (ref 65–99)
Glucose-Capillary: 163 mg/dL — ABNORMAL HIGH (ref 65–99)
Glucose-Capillary: 309 mg/dL — ABNORMAL HIGH (ref 65–99)

## 2015-11-21 LAB — BASIC METABOLIC PANEL
Anion gap: 8 (ref 5–15)
BUN: 48 mg/dL — AB (ref 6–20)
CALCIUM: 8.7 mg/dL — AB (ref 8.9–10.3)
CHLORIDE: 109 mmol/L (ref 101–111)
CO2: 27 mmol/L (ref 22–32)
CREATININE: 1.86 mg/dL — AB (ref 0.61–1.24)
GFR calc Af Amer: 37 mL/min — ABNORMAL LOW (ref >60)
GFR calc non Af Amer: 32 mL/min — ABNORMAL LOW (ref >60)
GLUCOSE: 147 mg/dL — AB (ref 65–99)
Potassium: 3.5 mmol/L (ref 3.5–5.1)
Sodium: 144 mmol/L (ref 135–145)

## 2015-11-21 LAB — CBC
HCT: 33.2 % — ABNORMAL LOW (ref 40.0–52.0)
Hemoglobin: 11.6 g/dL — ABNORMAL LOW (ref 13.0–18.0)
MCH: 31.9 pg (ref 26.0–34.0)
MCHC: 35 g/dL (ref 32.0–36.0)
MCV: 91.2 fL (ref 80.0–100.0)
PLATELETS: 211 10*3/uL (ref 150–440)
RBC: 3.64 MIL/uL — ABNORMAL LOW (ref 4.40–5.90)
RDW: 15.1 % — ABNORMAL HIGH (ref 11.5–14.5)
WBC: 9.8 10*3/uL (ref 3.8–10.6)

## 2015-11-21 LAB — HEPARIN LEVEL (UNFRACTIONATED): HEPARIN UNFRACTIONATED: 0.97 [IU]/mL — AB (ref 0.30–0.70)

## 2015-11-21 LAB — PROTIME-INR
INR: 1.41
Prothrombin Time: 17.4 seconds — ABNORMAL HIGH (ref 11.4–15.2)

## 2015-11-21 LAB — HEMOGLOBIN A1C: HEMOGLOBIN A1C: 6.3 % — AB (ref 4.0–6.0)

## 2015-11-21 LAB — APTT
APTT: 107 s — AB (ref 24–36)
aPTT: 45 seconds — ABNORMAL HIGH (ref 24–36)

## 2015-11-21 MED ORDER — METHYLPREDNISOLONE SODIUM SUCC 40 MG IJ SOLR
40.0000 mg | Freq: Every day | INTRAMUSCULAR | Status: DC
  Administered 2015-11-21 – 2015-11-22 (×2): 40 mg via INTRAVENOUS
  Filled 2015-11-21 (×2): qty 1

## 2015-11-21 MED ORDER — RIVAROXABAN 15 MG PO TABS
15.0000 mg | ORAL_TABLET | Freq: Every day | ORAL | Status: DC
  Administered 2015-11-21 – 2015-11-22 (×2): 15 mg via ORAL
  Filled 2015-11-21 (×2): qty 1

## 2015-11-21 MED ORDER — LEVOFLOXACIN IN D5W 750 MG/150ML IV SOLN
750.0000 mg | INTRAVENOUS | Status: DC
  Filled 2015-11-21: qty 150

## 2015-11-21 MED ORDER — IPRATROPIUM-ALBUTEROL 0.5-2.5 (3) MG/3ML IN SOLN
3.0000 mL | Freq: Four times a day (QID) | RESPIRATORY_TRACT | Status: DC
  Administered 2015-11-21 – 2015-11-22 (×5): 3 mL via RESPIRATORY_TRACT
  Filled 2015-11-21 (×5): qty 3

## 2015-11-21 NOTE — Progress Notes (Signed)
Patient complaining of SOB. Checked O2 saturation and its 97% on RA. Patient sitting in chair. Denies any pressure to his chest . Just "can't get his breath like he use to". Will notify MD and continue to monitor.

## 2015-11-21 NOTE — Care Management Note (Signed)
Case Management Note  Patient Details  Name: Patrick [name] MRN: [id number] Date of Birth: Oct 18, 1931  Subjective/Objective:    Spoke with patient for discharge planning. Patient is from home alert, oriented, and independent with spouse, he still drives. Spouse is able to drive patient to appointments as well. Patient also has and adult son that is able to assist him.  Wife who was present at the bedside stated that he has a rolling walker but that he  doesn't use it.    Patient gets his meds through [facility] order service. He gets his other scripts filled at [facility]. Denies issues with filling meds. He is on O2 here and does not wear it a home.         Action/Plan: Home with self care.  Expected Discharge Date:  11/22/15               Expected Discharge Plan:  Home/Self Care  In-House Referral:     Discharge planning Services  CM Consult  Post Acute Care Choice:    Choice offered to:     DME Arranged:    DME Agency:     HH Arranged:    HH Agency:     Status of Service:  In process, will continue to follow  If discussed at Long Length of Stay Meetings, dates discussed:    Additional Comments:  Alvie Heidelberg, RN 11/21/2015, 2:43 PM

## 2015-11-21 NOTE — Plan of Care (Signed)
Problem: Safety: Goal: Ability to remain free from injury will improve Outcome: Progressing Bed alarm on   

## 2015-11-21 NOTE — Progress Notes (Signed)
ANTICOAGULATION CONSULT NOTE - Initial Consult  Pharmacy Consult for heparin Indication: atrial fibrillation  No Known Allergies  Patient Measurements: Height: 5\' 5"  (165.1 cm) Weight: 192 lb 8 oz (87.3 kg) IBW/kg (Calculated) : 61.5 Heparin Dosing Weight: 79.9 kg  Vital Signs: Temp: 98.1 F (36.7 C) (08/29 1141) Temp Source: Oral (08/29 1141) BP: 169/77 (08/29 1141) Pulse Rate: 64 (08/29 1141)  Labs:  Recent Labs  11/20/15 1711 11/20/15 2342 11/21/15 0535 11/21/15 1226  HGB 12.9*  --  11.6*  --   HCT 38.5*  --  33.2*  --   PLT 254  --  211  --   APTT  --  45*  --  107*  LABPROT  --  17.4*  --   --   INR  --  1.41  --   --   HEPARINUNFRC  --   --   --  0.97*  CREATININE 2.15*  --  1.86*  --     Estimated Creatinine Clearance: 31.1 mL/min (by C-G formula based on SCr of 1.86 mg/dL).   Medical History: Past Medical History:  Diagnosis Date  . Amputated finger   . Anemia of chronic disease    a. plan of oncology to start Procrit - received during admission 12/2013  . Atypical chest pain    a. 12/2014 Neg CE - in setting of L pleural effusion.  . Bell's palsy   . Chronic combined systolic and diastolic CHF (congestive heart failure) ([place])    a. 11/2012 Echo: EF 60-65%, mod conc LVH, mildly dil LA/RA, mild Ao sclerosis w/o stenosis; b. 11/2014 Echo: EF 40-45%, prob mid-apicalanteroseptal, ant, apical HK, Gr2 DD, mod dil LA, mildly dil RA (technically difficult study).  . CKD (chronic kidney disease), stage III    a. stage III-IV  . Diabetes mellitus without complication ([place])   . GERD (gastroesophageal reflux disease)   . History of gastroesophageal reflux (GERD)   . Hyperlipidemia   . Hypertension   . Permanent atrial fibrillation ([place])    a. Dx 12/2011, Rate-controlled, chronic Xarelto (renal dosing) - CHA2DS2VASc = 5.  . Pleural effusion, left    a. 12/2014 s/p thoracentesis - protein <3, LDH 123, no malignancy.    Medications:  Infusions:  . heparin  1,100 Units/hr (11/21/15 0459)    Assessment: 84 yom cc SOB. On rivaroxaban 15 mg po daily PTA for AF. Pharmacy consulted to dose heparin for AF. Per protocol, wait 24 hours to start parenteral AC for CrCl < 30 mL/min. Patient unable to tell us last dose time, but PTA lists 8/28 at 0545 - will start heparin drip at 0545 on 11/21/15. Baseline labs ordered.  Goal of Therapy:  Heparin level 0.3-0.7 units/ml aPTT 66 to 102 s seconds Monitor platelets by anticoagulation protocol: Yes   Plan:   11/21/2015 0415 baseline heparin level supratherapeutic (needed dilution because sample exceeded linearity, but expired before dilution could be completed). Will send drip to start at 0545, check aPTT 6 hours later, and draw HL at the same time.  11/21/2015 1226 aPTT 107 and HL 0.97. APTT w/in goal. Will continue heparin 1100 units/hr and recheck aPTT and HL in 6 hours. Will need to continue to check aPTT and and HL until both are w/in range.   Nancy Fetter, PharmD Clinical Pharmacist 11/21/2015 1:04 PM

## 2015-11-21 NOTE — Progress Notes (Signed)
A & O. HOH. Son at the bedside. Pt reported neck pain and received Po meds as well as k pad was placed. Takes meds ok. Heparin gtt was d/c and xalrelto started. Urinal and 1 assist to [facility]. Fs are stable. 0.5 L of oxygen. Pt has no further concerns at this time.

## 2015-11-21 NOTE — Progress Notes (Signed)
[place] at [place] NAME: Patrick [name]    MR#:  [id number]  DATE OF BIRTH:  11-17-1931  SUBJECTIVE:   Patient here due to shortness of breath and noted to be in acute respiratory failure with hypoxia secondary to pneumonia. Still having some shortness of breath, and also some neck pain.  REVIEW OF SYSTEMS:    Review of Systems  Constitutional: Negative for chills and fever.  HENT: Negative for congestion and tinnitus.   Eyes: Negative for blurred vision and double vision.  Respiratory: Negative for cough, shortness of breath and wheezing.   Cardiovascular: Negative for chest pain, orthopnea and PND.  Gastrointestinal: Negative for abdominal pain, diarrhea, nausea and vomiting.  Genitourinary: Negative for dysuria and hematuria.  Musculoskeletal: Positive for neck pain.  Neurological: Positive for weakness. Negative for dizziness, sensory change and focal weakness.  All other systems reviewed and are negative.   Nutrition: Heart healthy/Carb control Tolerating Diet: yes Tolerating PT: Await Eval.    DRUG ALLERGIES:  No Known Allergies  VITALS:  Blood pressure (!) 169/77, pulse 64, temperature 98.1 F (36.7 C), temperature source Oral, resp. rate 18, height 5\' 5"  (1.651 m), weight 87.3 kg (192 lb 8 oz), SpO2 98 %.  PHYSICAL EXAMINATION:   Physical Exam  GENERAL:  82 y.o.-year-old patient lying in the bed in no acute distress.  EYES: Pupils equal, round, reactive to light and accommodation. No scleral icterus. Extraocular muscles intact.  HEENT: Head atraumatic, normocephalic. Oropharynx and nasopharynx clear.  NECK:  Supple, no jugular venous distention. No thyroid enlargement, no tenderness.  LUNGS: Poor Resp. Effort,  no wheezing, rales, rhonchi. No use of accessory muscles of respiration.  CARDIOVASCULAR: S1, S2 normal. No murmurs, rubs, or gallops.  ABDOMEN: Soft, nontender, nondistended. Bowel sounds present. No  organomegaly or mass.  EXTREMITIES: No cyanosis, clubbing or edema b/l.    NEUROLOGIC: Cranial nerves II through XII are intact. No focal Motor or sensory deficits b/l. Globally weak.   PSYCHIATRIC: The patient is alert and oriented x 3.  SKIN: No obvious rash, lesion, or ulcer.    LABORATORY PANEL:   CBC  Recent Labs Lab 11/21/15 0535  WBC 9.8  HGB 11.6*  HCT 33.2*  PLT 211   ------------------------------------------------------------------------------------------------------------------  Chemistries   Recent Labs Lab 11/20/15 1711 11/21/15 0535  NA 141 144  K 3.3* 3.5  CL 107 109  CO2 27 27  GLUCOSE 219* 147*  BUN 52* 48*  CREATININE 2.15* 1.86*  CALCIUM 8.9 8.7*  AST 22  --   ALT 20  --   ALKPHOS 66  --   BILITOT 0.7  --    ------------------------------------------------------------------------------------------------------------------  Cardiac Enzymes No results for input(s): TROPONINI in the last 168 hours. ------------------------------------------------------------------------------------------------------------------  RADIOLOGY:  Dg Chest 2 View  Result Date: 11/20/2015 CLINICAL DATA:  82 year old male with shortness of breath for the past 1-2 days. Recently diagnosis with bronchitis, treated with prednisone, with no relief of symptoms. EXAM: CHEST  2 VIEW COMPARISON:  Chest x-ray 11/14/2015. FINDINGS: Areas of pleural parenchymal thickening and architectural distortion throughout the mid to lower thorax bilaterally (left greater than right), similar to prior studies, favored to reflect chronic scarring. Trace left pleural effusion appears to be chronic. Diffuse peribronchial cuffing. Ill-defined interstitial and airspace opacities throughout the mid to lower lungs bilaterally, concerning for mild multilobar bronchopneumonia. Heart size is borderline enlarged. No evidence of pulmonary edema. Upper mediastinal contours appear widened with, similar to prior  studies,  presumably related to underlying lymphadenopathy (as demonstrated on prior chest CT 12/22/2014). Aortic atherosclerosis. IMPRESSION: 1. The appearance the chest is concerning for possible multilobar bronchopneumonia, as above. 2. Areas of chronic pleuroparenchymal scarring, in addition to trace left pleural effusion, which appears to be chronic. 3. Widened mediastinal contours, similar to prior examinations, presumably related to underlying lymphadenopathy (better demonstrated on prior chest CT 12/22/2014). Clinical correlation for signs and symptoms of underlying lymphoproliferative disorder is recommended. Electronically Signed   By: Vinnie Langton M.D.   On: 11/20/2015 17:32     ASSESSMENT AND PLAN:   82 year old male with past medical history of atrial fibrillation, hypertension, hyperlipidemia, GERD, diabetes type 2 without complication, chronic kidney disease stage III, history of chronic combined systolic/diastolic CHF history of anemia of chronic disease who presented to the hospital due to shortness of breath.  1. Acute respiratory failure with hypoxia-secondary to multifocal pneumonia seen on chest x-ray admission. -Continue O2 supplementation. Continue IV antibiotics for pneumonia. Follow clinically. Wean O2 as tolerated.  2. Multifocal pneumonia-cause of patient's acute respiratory failure. -Continue IV Levaquin, follow blood, sputum cultures. If not improving consider doing a CT chest noncontrast to further evaluate this. -Started on some IV steroids.  3.Chronic renal failure- Cr. Close to baseline and will monitor.   4. Chronic a. Fib - rate controlled - cont. Metoprolol. Cr. Clearance has improved and will d/c heparin gtt and start Xarelto.    5. DM type II - cont. SSI and follow BS  6. HTN - cont. Clonidine, Enalapril, Metoprolol.    7. GERD - cont.. Protonix.    All the records are reviewed and case discussed with Care Management/Social Worker. Management plans  discussed with the patient, family and they are in agreement.  CODE STATUS: Full Code  DVT Prophylaxis: Heparin gtt  TOTAL TIME TAKING CARE OF THIS PATIENT: 30 minutes.   POSSIBLE D/C IN 2-3 DAYS, DEPENDING ON CLINICAL CONDITION.   Henreitta Leber M.D on 11/21/2015 at 1:30 PM  Between 7am to 6pm - Pager - [phone]  After 6pm go to [email or web address] - Proofreader  [facility]  Office  [phone]  CC: Primary care physician; Maryland Pink, MD

## 2015-11-22 LAB — GLUCOSE, CAPILLARY
Glucose-Capillary: 138 mg/dL — ABNORMAL HIGH (ref 65–99)
Glucose-Capillary: 185 mg/dL — ABNORMAL HIGH (ref 65–99)

## 2015-11-22 LAB — BASIC METABOLIC PANEL
Anion gap: 6 (ref 5–15)
BUN: 56 mg/dL — AB (ref 6–20)
CO2: 28 mmol/L (ref 22–32)
Calcium: 9 mg/dL (ref 8.9–10.3)
Chloride: 107 mmol/L (ref 101–111)
Creatinine, Ser: 2.13 mg/dL — ABNORMAL HIGH (ref 0.61–1.24)
GFR calc Af Amer: 32 mL/min — ABNORMAL LOW (ref >60)
GFR, EST NON AFRICAN AMERICAN: 27 mL/min — AB (ref >60)
GLUCOSE: 194 mg/dL — AB (ref 65–99)
POTASSIUM: 3.6 mmol/L (ref 3.5–5.1)
Sodium: 141 mmol/L (ref 135–145)

## 2015-11-22 LAB — CBC
HCT: 33 % — ABNORMAL LOW (ref 40.0–52.0)
HEMOGLOBIN: 11.6 g/dL — AB (ref 13.0–18.0)
MCH: 32.3 pg (ref 26.0–34.0)
MCHC: 35.2 g/dL (ref 32.0–36.0)
MCV: 91.8 fL (ref 80.0–100.0)
PLATELETS: 228 10*3/uL (ref 150–440)
RBC: 3.6 MIL/uL — AB (ref 4.40–5.90)
RDW: 14.8 % — AB (ref 11.5–14.5)
WBC: 10.6 10*3/uL (ref 3.8–10.6)

## 2015-11-22 MED ORDER — LEVOFLOXACIN 750 MG PO TABS: 750.0000 mg | ORAL_TABLET | Freq: Every day | ORAL | 0 refills | Status: DC

## 2015-11-22 MED ORDER — PREDNISONE 10 MG PO TABS: ORAL_TABLET | ORAL | 0 refills | Status: DC

## 2015-11-22 NOTE — Care Management Note (Signed)
Case Management Note  Patient Details  Name: Patrick [name] MRN: [id number] Date of Birth: 29-Dec-1931  Subjective/Objective:                    Action/Plan: discharge home today no needs. Signed off.  Expected Discharge Date:  11/22/15               Expected Discharge Plan:  Home/Self Care  In-House Referral:     Discharge planning Services  CM Consult  Post Acute Care Choice:    Choice offered to:     DME Arranged:    DME Agency:     HH Arranged:    HH Agency:     Status of Service:  Completed, signed off  If discussed at [employer] of Stay Meetings, dates discussed:    Additional Comments:  Alvie Heidelberg, RN 11/22/2015, 10:40 AM

## 2015-11-22 NOTE — Discharge Summary (Signed)
[facility] NAME: Patrick [name]    MR#:  [id number]  DATE OF BIRTH:  10-Apr-1931  DATE OF ADMISSION:  11/20/2015 ADMITTING PHYSICIAN: Lance Coon, MD  DATE OF DISCHARGE: 11/22/2015  PRIMARY CARE PHYSICIAN: Maryland Pink, MD    ADMISSION DIAGNOSIS:  Community acquired pneumonia [J18.9]  DISCHARGE DIAGNOSIS:  Principal Problem:   CAP (community acquired pneumonia) Active Problems:   A-fib ([place])   Hypertension   Diabetes mellitus ([place])   Chronic combined systolic and diastolic CHF (congestive heart failure) ([place])   Acute kidney injury superimposed on chronic kidney disease ([place])   SECONDARY DIAGNOSIS:   Past Medical History:  Diagnosis Date  . Amputated finger   . Anemia of chronic disease    a. plan of oncology to start Procrit - received during admission 12/2013  . Atypical chest pain    a. 12/2014 Neg CE - in setting of L pleural effusion.  . Bell's palsy   . Chronic combined systolic and diastolic CHF (congestive heart failure) ([place])    a. 11/2012 Echo: EF 60-65%, mod conc LVH, mildly dil LA/RA, mild Ao sclerosis w/o stenosis; b. 11/2014 Echo: EF 40-45%, prob mid-apicalanteroseptal, ant, apical HK, Gr2 DD, mod dil LA, mildly dil RA (technically difficult study).  . CKD (chronic kidney disease), stage III    a. stage III-IV  . Diabetes mellitus without complication ([place])   . GERD (gastroesophageal reflux disease)   . History of gastroesophageal reflux (GERD)   . Hyperlipidemia   . Hypertension   . Permanent atrial fibrillation ([place])    a. Dx 12/2011, Rate-controlled, chronic Xarelto (renal dosing) - CHA2DS2VASc = 5.  . Pleural effusion, left    a. 12/2014 s/p thoracentesis - protein <3, LDH 123, no malignancy.    HOSPITAL COURSE:   82 year old male with past medical history of atrial fibrillation, hypertension, hyperlipidemia, GERD, diabetes type 2 without complication, chronic kidney disease stage III,  history of chronic combined systolic/diastolic CHF history of anemia of chronic disease who presented to the hospital due to shortness of breath.  1. Acute respiratory failure with hypoxia-secondary to multifocal pneumonia seen on chest x-ray admission. -Patient was given IV Levaquin and has clinically improved. He was ambulated and his O2 sats did not drop below 92%. He is currently afebrile and does not feel any worsening shortness of breath. He's therefore being discharged on oral antibiotics.  2. Multifocal pneumonia-cause of patient's acute respiratory failure. -On the hospital he was given IV Levaquin, and he has clinically improved. He was also given some IV steroids. -He has clinically improved and now being discharged on oral Levaquin and a prednisone taper. He denies any cough, fever, chills.  3.Chronic renal failure- Cr. Close to baseline and can be further followed as an outpatient.  4. Chronic a. Fib - rate controlled and he will cont. Metoprolol.  - he will cont. His Xarelto  5. DM type II - patient will resume his Humalog 75/25 mix upon discharge.   6. HTN - he will cont. Clonidine, Enalapril, Metoprolol.    7. GERD - he will cont. His Omeprazole   DISCHARGE CONDITIONS:   Stable.   CONSULTS OBTAINED:    DRUG ALLERGIES:  No Known Allergies  DISCHARGE MEDICATIONS:     Medication List    STOP taking these medications   azithromycin 250 MG tablet Commonly known as:  ZITHROMAX Z-PAK     TAKE these medications   aspirin 81 MG tablet Take  81 mg by mouth daily.   cloNIDine 0.1 MG tablet Commonly known as:  CATAPRES Take 1 tablet (0.1 mg total) by mouth 2 (two) times daily.   enalapril 5 MG tablet Commonly known as:  VASOTEC Take 1 tablet (5 mg total) by mouth daily.   HUMALOG MIX 75/25 [place] Inject 50 Units into the skin 2 (two) times daily. Don't take if under 150   levofloxacin 750 MG tablet Commonly known as:  LEVAQUIN Take 1 tablet (750 mg total)  by mouth daily.   metoprolol 50 MG tablet Commonly known as:  LOPRESSOR Take 1 tablet (50 mg total) by mouth 2 (two) times daily.   omeprazole 40 MG capsule Commonly known as:  PRILOSEC Take 1 capsule (40 mg total) by mouth daily.   pravastatin 40 MG tablet Commonly known as:  PRAVACHOL Take 2 tablets (80 mg total) by mouth daily. Take two tablets daily.   predniSONE 10 MG tablet Commonly known as:  DELTASONE Label  & dispense according to the schedule below. 4 Pills PO for 1 day, 3 Pills PO for 1 day, 2 Pills PO for 1 day, 1 Pill PO for 1 days then STOP. What changed:  medication strength  how much to take  how to take this  when to take this  additional instructions   Rivaroxaban 15 MG Tabs tablet Commonly known as:  XARELTO Take 1 tablet (15 mg total) by mouth daily.   torsemide 20 MG tablet Commonly known as:  DEMADEX TAKE ONE TABLET BY MOUTH TWICE DAILY AS NEEDED What changed:  how much to take  when to take this  reasons to take this  additional instructions         DISCHARGE INSTRUCTIONS:   DIET:  Cardiac diet and Diabetic diet  DISCHARGE CONDITION:  Stable  ACTIVITY:  Activity as tolerated  OXYGEN:  Home Oxygen: No.   Oxygen Delivery: room air  DISCHARGE LOCATION:  home   If you experience worsening of your admission symptoms, develop shortness of breath, life threatening emergency, suicidal or homicidal thoughts you must seek medical attention immediately by calling 911 or calling your MD immediately  if symptoms less severe.  You Must read complete instructions/literature along with all the possible adverse reactions/side effects for all the Medicines you take and that have been prescribed to you. Take any new Medicines after you have completely understood and accpet all the possible adverse reactions/side effects.   Please note  You were cared for by a hospitalist during your hospital stay. If you have any questions about your  discharge medications or the care you received while you were in the hospital after you are discharged, you can call the unit and asked to speak with the hospitalist on call if the hospitalist that took care of you is not available. Once you are discharged, your primary care physician will handle any further medical issues. Please note that NO REFILLS for any discharge medications will be authorized once you are discharged, as it is imperative that you return to your primary care physician (or establish a relationship with a primary care physician if you do not have one) for your aftercare needs so that they can reassess your need for medications and monitor your lab values.     Today   No shortness of breath, cough, fever, chills. WAnts to go home. Feels much better.   VITAL SIGNS:  Blood pressure 140/77, pulse 63, temperature 97.8 F (36.6 C), temperature source Oral, resp. rate  18, height 5\' 5"  (1.651 m), weight 92 kg (202 lb 14.4 oz), SpO2 95 %.  I/O:   Intake/Output Summary (Last 24 hours) at 11/22/15 1312 Last data filed at 11/22/15 0636  Gross per 24 hour  Intake              480 ml  Output              710 ml  Net             -230 ml    PHYSICAL EXAMINATION:  GENERAL:  82 y.o.-year-old patient lying in the bed in no acute distress.  EYES: Pupils equal, round, reactive to light and accommodation. No scleral icterus. Extraocular muscles intact.  HEENT: Head atraumatic, normocephalic. Oropharynx and nasopharynx clear.  NECK:  Supple, no jugular venous distention. No thyroid enlargement, no tenderness.  LUNGS: Normal breath sounds bilaterally, no wheezing, rales,rhonchi. No use of accessory muscles of respiration.  CARDIOVASCULAR: S1, S2 normal. No murmurs, rubs, or gallops.  ABDOMEN: Soft, non-tender, non-distended. Bowel sounds present. No organomegaly or mass.  EXTREMITIES: No pedal edema, cyanosis, or clubbing.  NEUROLOGIC: Cranial nerves II through XII are intact. No focal  motor or sensory defecits b/l.  PSYCHIATRIC: The patient is alert and oriented x 3. Good affect.  SKIN: No obvious rash, lesion, or ulcer.   DATA REVIEW:   CBC  Recent Labs Lab 11/22/15 0401  WBC 10.6  HGB 11.6*  HCT 33.0*  PLT 228    Chemistries   Recent Labs Lab 11/20/15 1711  11/22/15 0401  NA 141  < > 141  K 3.3*  < > 3.6  CL 107  < > 107  CO2 27  < > 28  GLUCOSE 219*  < > 194*  BUN 52*  < > 56*  CREATININE 2.15*  < > 2.13*  CALCIUM 8.9  < > 9.0  AST 22  --   --   ALT 20  --   --   ALKPHOS 66  --   --   BILITOT 0.7  --   --   < > = values in this interval not displayed.  Cardiac Enzymes No results for input(s): TROPONINI in the last 168 hours.  Microbiology Results  Results for orders placed or performed during the hospital encounter of 11/20/15  Blood culture (routine x 2)     Status: None (Preliminary result)   Collection Time: 11/20/15  7:51 PM  Result Value Ref Range Status   Specimen Description BLOOD LEFT ASSIST CONTROL  Final   Special Requests BOTTLES DRAWN AEROBIC AND ANAEROBIC 8CC  Final   Culture NO GROWTH 1 DAY  Final   Report Status PENDING  Incomplete  Blood culture (routine x 2)     Status: None (Preliminary result)   Collection Time: 11/20/15  7:51 PM  Result Value Ref Range Status   Specimen Description BLOOD RIGHT ASSIST CONTROL  Final   Special Requests BOTTLES DRAWN AEROBIC AND ANAEROBIC 5CC  Final   Culture NO GROWTH 1 DAY  Final   Report Status PENDING  Incomplete    RADIOLOGY:  Dg Chest 2 View  Result Date: 11/20/2015 CLINICAL DATA:  82 year old male with shortness of breath for the past 1-2 days. Recently diagnosis with bronchitis, treated with prednisone, with no relief of symptoms. EXAM: CHEST  2 VIEW COMPARISON:  Chest x-ray 11/14/2015. FINDINGS: Areas of pleural parenchymal thickening and architectural distortion throughout the mid to lower thorax bilaterally (left greater than right),  similar to prior studies, favored to  reflect chronic scarring. Trace left pleural effusion appears to be chronic. Diffuse peribronchial cuffing. Ill-defined interstitial and airspace opacities throughout the mid to lower lungs bilaterally, concerning for mild multilobar bronchopneumonia. Heart size is borderline enlarged. No evidence of pulmonary edema. Upper mediastinal contours appear widened with, similar to prior studies, presumably related to underlying lymphadenopathy (as demonstrated on prior chest CT 12/22/2014). Aortic atherosclerosis. IMPRESSION: 1. The appearance the chest is concerning for possible multilobar bronchopneumonia, as above. 2. Areas of chronic pleuroparenchymal scarring, in addition to trace left pleural effusion, which appears to be chronic. 3. Widened mediastinal contours, similar to prior examinations, presumably related to underlying lymphadenopathy (better demonstrated on prior chest CT 12/22/2014). Clinical correlation for signs and symptoms of underlying lymphoproliferative disorder is recommended. Electronically Signed   By: Vinnie Langton M.D.   On: 11/20/2015 17:32      Management plans discussed with the patient, family and they are in agreement.  CODE STATUS:     Code Status Orders        Start     Ordered   11/20/15 2234  Full code  Continuous     11/20/15 2233    Code Status History    Date Active Date Inactive Code Status Order ID Comments User Context   12/23/2014 11:44 AM 12/26/2014  7:40 PM Partial Code [id number]  Hillary Bow, MD Inpatient   12/23/2014  1:27 AM 12/23/2014 11:44 AM Full Code [id number]  Juluis Mire, MD Inpatient      TOTAL TIME TAKING CARE OF THIS PATIENT: 40 minutes.    Henreitta Leber M.D on 11/22/2015 at 1:12 PM  Between 7am to 6pm - Pager - [phone]  After 6pm go to [email or web address] - Proofreader  [facility]  Office  [phone]  CC: Primary care physician; [place] Pink, MD

## 2015-11-22 NOTE — Progress Notes (Signed)
[place] was admitted to the Hospital on 11/20/2015 and Discharged  11/22/2015 and should be excused from work/school   for 3 days starting 11/20/2015 , may return to work/school without any restrictions.  Call Abel Presto MD, [facility]  [phone] with questions.  Henreitta Leber M.D on 11/22/2015,at 12:43 PM

## 2015-11-22 NOTE — Progress Notes (Signed)
Patient discharge summary reviewed with patient and spouse with verbal understanding. VSS at this time. Belongings and 2 RXs given upon discharge.

## 2015-11-22 NOTE — Care Management Important Message (Signed)
Important Message  Patient Details  Name: RALPHEL WOHLRAB MRN: [id number] Date of Birth: 1931/12/29   Medicare Important Message Given:  N/A - LOS <3 / Initial given by admissions    Alvie Heidelberg, RN 11/22/2015, 10:41 AM

## 2015-11-26 LAB — CULTURE, BLOOD (ROUTINE X 2)
CULTURE: NO GROWTH
CULTURE: NO GROWTH

## 2015-12-12 DIAGNOSIS — R0602 Shortness of breath: Secondary | ICD-10-CM | POA: Diagnosis not present

## 2015-12-12 DIAGNOSIS — N184 Chronic kidney disease, stage 4 (severe): Secondary | ICD-10-CM | POA: Diagnosis not present

## 2015-12-13 MED ORDER — LOSARTAN POTASSIUM 50 MG PO TABS
ORAL_TABLET | ORAL | Status: AC
  Filled 2015-12-13: qty 2

## 2015-12-13 MED ORDER — HYDROCHLOROTHIAZIDE 12.5 MG PO CAPS
ORAL_CAPSULE | ORAL | Status: AC
  Filled 2015-12-13: qty 1

## 2015-12-18 ENCOUNTER — Encounter: Payer: Self-pay | Admitting: Nurse Practitioner

## 2015-12-18 ENCOUNTER — Encounter: Payer: Self-pay | Admitting: Family

## 2015-12-18 ENCOUNTER — Ambulatory Visit (INDEPENDENT_AMBULATORY_CARE_PROVIDER_SITE_OTHER): Payer: Commercial Managed Care - HMO | Admitting: Nurse Practitioner

## 2015-12-18 ENCOUNTER — Ambulatory Visit: Payer: Commercial Managed Care - HMO | Admitting: Family

## 2015-12-18 VITALS — BP 160/70 | HR 56 | Ht 68.0 in | Wt 191.0 lb

## 2015-12-18 VITALS — BP 183/73 | HR 59 | Resp 18 | Ht 67.0 in | Wt 191.0 lb

## 2015-12-18 DIAGNOSIS — Z9889 Other specified postprocedural states: Secondary | ICD-10-CM | POA: Insufficient documentation

## 2015-12-18 DIAGNOSIS — I5042 Chronic combined systolic (congestive) and diastolic (congestive) heart failure: Secondary | ICD-10-CM

## 2015-12-18 DIAGNOSIS — Z9119 Patient's noncompliance with other medical treatment and regimen: Secondary | ICD-10-CM | POA: Diagnosis not present

## 2015-12-18 DIAGNOSIS — R001 Bradycardia, unspecified: Secondary | ICD-10-CM | POA: Insufficient documentation

## 2015-12-18 DIAGNOSIS — I130 Hypertensive heart and chronic kidney disease with heart failure and stage 1 through stage 4 chronic kidney disease, or unspecified chronic kidney disease: Secondary | ICD-10-CM | POA: Insufficient documentation

## 2015-12-18 DIAGNOSIS — E1122 Type 2 diabetes mellitus with diabetic chronic kidney disease: Secondary | ICD-10-CM

## 2015-12-18 DIAGNOSIS — Z79899 Other long term (current) drug therapy: Secondary | ICD-10-CM

## 2015-12-18 DIAGNOSIS — E119 Type 2 diabetes mellitus without complications: Secondary | ICD-10-CM | POA: Diagnosis not present

## 2015-12-18 DIAGNOSIS — Z833 Family history of diabetes mellitus: Secondary | ICD-10-CM | POA: Diagnosis not present

## 2015-12-18 DIAGNOSIS — I517 Cardiomegaly: Secondary | ICD-10-CM | POA: Diagnosis not present

## 2015-12-18 DIAGNOSIS — I481 Persistent atrial fibrillation: Secondary | ICD-10-CM | POA: Diagnosis not present

## 2015-12-18 DIAGNOSIS — J9601 Acute respiratory failure with hypoxia: Secondary | ICD-10-CM | POA: Diagnosis not present

## 2015-12-18 DIAGNOSIS — I5043 Acute on chronic combined systolic (congestive) and diastolic (congestive) heart failure: Secondary | ICD-10-CM | POA: Diagnosis not present

## 2015-12-18 DIAGNOSIS — Z888 Allergy status to other drugs, medicaments and biological substances status: Secondary | ICD-10-CM

## 2015-12-18 DIAGNOSIS — E876 Hypokalemia: Secondary | ICD-10-CM | POA: Diagnosis not present

## 2015-12-18 DIAGNOSIS — N184 Chronic kidney disease, stage 4 (severe): Secondary | ICD-10-CM

## 2015-12-18 DIAGNOSIS — N183 Chronic kidney disease, stage 3 unspecified: Secondary | ICD-10-CM

## 2015-12-18 DIAGNOSIS — I110 Hypertensive heart disease with heart failure: Secondary | ICD-10-CM

## 2015-12-18 DIAGNOSIS — I509 Heart failure, unspecified: Secondary | ICD-10-CM | POA: Diagnosis not present

## 2015-12-18 DIAGNOSIS — E785 Hyperlipidemia, unspecified: Secondary | ICD-10-CM

## 2015-12-18 DIAGNOSIS — R0600 Dyspnea, unspecified: Secondary | ICD-10-CM | POA: Diagnosis not present

## 2015-12-18 DIAGNOSIS — Z7982 Long term (current) use of aspirin: Secondary | ICD-10-CM

## 2015-12-18 DIAGNOSIS — K219 Gastro-esophageal reflux disease without esophagitis: Secondary | ICD-10-CM | POA: Insufficient documentation

## 2015-12-18 DIAGNOSIS — Z7901 Long term (current) use of anticoagulants: Secondary | ICD-10-CM | POA: Insufficient documentation

## 2015-12-18 DIAGNOSIS — I4891 Unspecified atrial fibrillation: Secondary | ICD-10-CM | POA: Diagnosis not present

## 2015-12-18 DIAGNOSIS — D638 Anemia in other chronic diseases classified elsewhere: Secondary | ICD-10-CM | POA: Diagnosis not present

## 2015-12-18 DIAGNOSIS — I5032 Chronic diastolic (congestive) heart failure: Secondary | ICD-10-CM

## 2015-12-18 DIAGNOSIS — Z87891 Personal history of nicotine dependence: Secondary | ICD-10-CM

## 2015-12-18 DIAGNOSIS — I482 Chronic atrial fibrillation: Secondary | ICD-10-CM | POA: Insufficient documentation

## 2015-12-18 DIAGNOSIS — Z8249 Family history of ischemic heart disease and other diseases of the circulatory system: Secondary | ICD-10-CM | POA: Diagnosis not present

## 2015-12-18 DIAGNOSIS — R0602 Shortness of breath: Secondary | ICD-10-CM | POA: Diagnosis not present

## 2015-12-18 DIAGNOSIS — Z794 Long term (current) use of insulin: Secondary | ICD-10-CM | POA: Diagnosis not present

## 2015-12-18 DIAGNOSIS — I10 Essential (primary) hypertension: Secondary | ICD-10-CM

## 2015-12-18 DIAGNOSIS — I4819 Other persistent atrial fibrillation: Secondary | ICD-10-CM

## 2015-12-18 DIAGNOSIS — Z881 Allergy status to other antibiotic agents status: Secondary | ICD-10-CM | POA: Diagnosis not present

## 2015-12-18 NOTE — Progress Notes (Signed)
Patient ID: Patrick [name], male    DOB: [birth year], 82 y.o.   MRN: [id number]  HPI  Mr Chaikin is a 82 y/o male with a history of chronic kidney disease (stage III-IV), diabetes on insulin, HTN, persistent atrial fibrillation and chronic diastolic heart failure with an EF of 40-45% as of 12/23/14 who returns for a f/u visit.  Last echo done 12/23/14 without any aortic stenosis and trivial mitral regurgitation.  Last hospital admission was August 2017 with pneumonia. Treated with antibiotics and prednisone and he had a reaction to one of them but finished both medications. Today his wife asks that both medications be added to his allergies. Said he got confused and scared while taking them.  Returns today for a f/u. Weighing himself daily and weight chart shows an overall stable weight. He is eating better and drinking glucerna 1-2 times daily. Not adding salt to his food. No swelling in his legs/abdomen. Chronic difficulty sleeping at night as he's sleeping some during the day. Drinking tea during the day, not much water.    Past Medical History:  Diagnosis Date  . Amputated finger   . Anemia of chronic disease    a. plan of oncology to start Procrit - received during admission 12/2013  . Atypical chest pain    a. 12/2014 Neg CE - in setting of L pleural effusion.  . Bell's palsy   . Chronic combined systolic and diastolic CHF (congestive heart failure) ([place])    a. 11/2012 Echo: EF 60-65%, mod conc LVH, mildly dil LA/RA, mild Ao sclerosis w/o stenosis; b. 11/2014 Echo: EF 40-45%, prob mid-apicalanteroseptal, ant, apical HK, Gr2 DD, mod dil LA, mildly dil RA (technically difficult study).  . CKD (chronic kidney disease), stage III    a. stage III-IV  . Diabetes mellitus without complication ([place])   . GERD (gastroesophageal reflux disease)   . History of gastroesophageal reflux (GERD)   . Hyperlipidemia   . Hypertension   . Permanent atrial fibrillation ([place])    a. Dx 12/2011,  Rate-controlled, chronic Xarelto (renal dosing) - CHA2DS2VASc = 5.  . Pleural effusion, left    a. 12/2014 s/p thoracentesis - protein <3, LDH 123, no malignancy.    Past Surgical History:  Procedure Laterality Date  . APPENDECTOMY    . COLONOSCOPY  09/2011  . ESOPHAGOGASTRODUODENOSCOPY    . FINGER SURGERY     right hand    Family History  Problem Relation Age of Onset  . Heart attack Brother   . Diabetes Mother   . Diabetes Sister   . Hypertension      Social History  Substance Use Topics  . Smoking status: Former Smoker    Packs/day: 0.25    Years: 10.00    Types: Cigars    Quit date: 05/06/1966  . Smokeless tobacco: Never Used  . Alcohol use No    Allergies  Allergen Reactions  . Levofloxacin Other (See Comments)    confusion  . Prednisone Other (See Comments)    confusion    Prior to Admission medications   Medication Sig Start Date End Date Taking? Authorizing Provider  aspirin 81 MG tablet Take 81 mg by mouth daily.   Yes Historical Provider, MD  cloNIDine (CATAPRES) 0.1 MG tablet Take 1 tablet (0.1 mg total) by mouth 2 (two) times daily. 09/20/15  Yes Alisa Graff, FNP  enalapril (VASOTEC) 5 MG tablet Take 1 tablet (5 mg total) by mouth daily. 09/20/15  Yes Otila Kluver A  Claudy Abdallah, FNP  Insulin Lispro Prot & Lispro (HUMALOG MIX 75/25 [place]) Inject 70 Units into the skin 2 (two) times daily. Don't take if under 150   Yes Historical Provider, MD  metoprolol (LOPRESSOR) 50 MG tablet Take 1 tablet (50 mg total) by mouth 2 (two) times daily. 09/20/15  Yes Alisa Graff, FNP  omeprazole (PRILOSEC) 40 MG capsule Take 1 capsule (40 mg total) by mouth daily. 09/20/15  Yes Alisa Graff, FNP  pravastatin (PRAVACHOL) 40 MG tablet Take 2 tablets (80 mg total) by mouth daily. Take two tablets daily. 09/20/15  Yes Alisa Graff, FNP  Rivaroxaban (XARELTO) 15 MG TABS tablet Take 1 tablet (15 mg total) by mouth daily. 05/05/12  Yes Minna Merritts, MD  torsemide (DEMADEX) 20 MG tablet  TAKE ONE TABLET BY MOUTH TWICE DAILY AS NEEDED Patient taking differently: 20 mg 2 (two) times daily as needed.  03/21/14  Yes Minna Merritts, MD    Review of Systems  Constitutional: Positive for fatigue. Negative for appetite change.  HENT: Negative for congestion, postnasal drip and sore throat.   Eyes: Negative.   Respiratory: Positive for cough. Negative for chest tightness and shortness of breath.   Cardiovascular: Negative for chest pain, palpitations and leg swelling.  Gastrointestinal: Negative for abdominal distention and abdominal pain.  Endocrine: Negative.   Genitourinary: Negative.   Musculoskeletal: Negative for back pain and neck pain.  Skin: Negative.   Allergic/Immunologic: Negative.   Neurological: Negative for dizziness and light-headedness.  Hematological: Negative for adenopathy. Does not bruise/bleed easily.  Psychiatric/Behavioral: Positive for sleep disturbance (trouble falling asleep). Negative for dysphoric mood. The patient is not nervous/anxious.     Vitals:   12/18/15 1410  BP: (!) 183/73  Pulse: (!) 59  Resp: 18  SpO2: 99%  Weight: 191 lb (86.6 kg)  Height: 5\' 7"  (1.702 m)     Physical Exam  Constitutional: He is oriented to person, place, and time. He appears well-developed and well-nourished.  HENT:  Head: Normocephalic and atraumatic.  Eyes: Conjunctivae are normal. Pupils are equal, round, and reactive to light.  Neck: Normal range of motion. Neck supple.  Cardiovascular: An irregular rhythm present. Bradycardia present.   Pulmonary/Chest: Effort normal. He has no wheezes. He has no rales.  Abdominal: Soft. He exhibits no distension. There is no tenderness.  Musculoskeletal: He exhibits edema (trace amount right ankle). He exhibits no tenderness.  Neurological: He is alert and oriented to person, place, and time.  Skin: Skin is warm and dry.  Psychiatric: He has a normal mood and affect. His behavior is normal. Thought content normal.   Vitals reviewed.    Assessment & Plan:  1: Chronic heart failure with preserved ejection fraction-  -NYHA class II -Volume status stable. Slight edema in right ankle but seems due to tight sock. No edema left ankle. -Continue daily weights and call for an overnight weight gain of >2 pounds or weekly weight gain of >5 pounds. -Taking torsemide only as needed based on weight or edema. -Seeing cardiology today  2: HTN- -Blood pressure elevated even on recheck with manual cuff -Home blood pressure readings reviewed and also are elevated at times. Unable to titrate betablocker as he's bradycardic. Limited as well due to renal function. -Wife is to continue checking blood pressure. -Took his medications today before coming to the appointment.  3: Persistent atrial fibrillation- -Bradycardic today & had been running in the 70's-80's.  -No bleeding noted with xarelto  4: CKD,  stage III-IV- -Follows closely with nephrology and has f/u appointment on 01/08/16. -Remains uninterested in dialysis  Medication bottles were reviewed.  Return here in 3 months or sooner for any questions/problems before then.

## 2015-12-18 NOTE — Progress Notes (Signed)
Office Visit    Patient Name: Patrick [name] Date of Encounter: 12/18/2015  Primary Care Provider:  [place] Pink, MD Primary Cardiologist:  Johnny Bridge, MD   Chief Complaint    82 y/o male with a h/o persistent AF, HTN, CKD III-IV, chronic combined syst/diast CHF, L pleural effusion, and recent PNA, who presents for f/u.  Past Medical History    Past Medical History:  Diagnosis Date  . Amputated finger   . Anemia of chronic disease    a. plan of oncology to start Procrit - received during admission 12/2013  . Atypical chest pain    a. 12/2014 Neg CE - in setting of L pleural effusion.  . Bell's palsy   . Chronic combined systolic and diastolic CHF (congestive heart failure) ([place])    a. 11/2012 Echo: EF 60-65%, mod conc LVH, mildly dil LA/RA, mild Ao sclerosis w/o stenosis; b. 11/2014 Echo: EF 40-45%, prob mid-apicalanteroseptal, ant, apical HK, Gr2 DD, mod dil LA, mildly dil RA (technically difficult study).  . CKD (chronic kidney disease), stage III    a. stage III-IV  . Diabetes mellitus without complication ([place])   . GERD (gastroesophageal reflux disease)   . History of gastroesophageal reflux (GERD)   . Hyperlipidemia   . Hypertension   . Permanent atrial fibrillation ([place])    a. Dx 12/2011, Rate-controlled, chronic Xarelto (renal dosing) - CHA2DS2VASc = 5.  . Pleural effusion, left    a. 12/2014 s/p thoracentesis - protein <3, LDH 123, no malignancy.   Past Surgical History:  Procedure Laterality Date  . APPENDECTOMY    . COLONOSCOPY  09/2011  . ESOPHAGOGASTRODUODENOSCOPY    . FINGER SURGERY     right hand    Allergies  Allergies  Allergen Reactions  . Levofloxacin Other (See Comments)    confusion  . Prednisone Other (See Comments)    confusion    History of Present Illness    82 y/o male with the above complex problem list. He has a h/o chronic combined syst/diast CHF, AF on xarelto, HTN, HL, L transudative pleural effusion s/p thoracentesis  in 12/2014, anemia of chronic dzs followed by heme-onc, and CKD III-IV.I last saw him in clinic in late August at which time, he had come off of all of his medications and was markedly hypertensive. After a long discussion with patient and family, he agreed to consider resuming his medications. About a week after that visit, he was admitted with pneumonia and subsequently treated and discharged. Since then, his wife says he has been compliant with his medications. She has really been on top of him. He is otherwise not particularly active. He has not been having any chest pain, dyspnea, palpitations, PND, orthopnea, nausea, vomiting, dizziness, syncope, edema, or a cane, or early satiety. His wife has been keeping a log of his blood pressures at home and the results are quite variable with some days in the 1 teens and others in the 170s.  Home Medications    Prior to Admission medications   Medication Sig Start Date End Date Taking? Authorizing Provider  aspirin 81 MG tablet Take 81 mg by mouth daily.   Yes Historical Provider, MD  cloNIDine (CATAPRES) 0.1 MG tablet Take 1 tablet (0.1 mg total) by mouth 2 (two) times daily. 09/20/15  Yes Alisa Graff, FNP  enalapril (VASOTEC) 5 MG tablet Take 1 tablet (5 mg total) by mouth daily. 09/20/15  Yes Alisa Graff, FNP  Insulin Lispro Prot &  Lispro (HUMALOG MIX 75/25 [place]) Inject 70 Units into the skin 2 (two) times daily. Don't take if under 150   Yes Historical Provider, MD  metoprolol (LOPRESSOR) 50 MG tablet Take 1 tablet (50 mg total) by mouth 2 (two) times daily. 09/20/15  Yes Alisa Graff, FNP  omeprazole (PRILOSEC) 40 MG capsule Take 1 capsule (40 mg total) by mouth daily. 09/20/15  Yes Alisa Graff, FNP  pravastatin (PRAVACHOL) 40 MG tablet Take 2 tablets (80 mg total) by mouth daily. Take two tablets daily. 09/20/15  Yes Alisa Graff, FNP  Rivaroxaban (XARELTO) 15 MG TABS tablet Take 1 tablet (15 mg total) by mouth daily. 05/05/12  Yes Minna Merritts, MD  torsemide (DEMADEX) 20 MG tablet TAKE ONE TABLET BY MOUTH TWICE DAILY AS NEEDED Patient taking differently: 20 mg 2 (two) times daily as needed.  03/21/14  Yes Minna Merritts, MD    Review of Systems    He denies chest pain, palpitations, dyspnea, pnd, orthopnea, n, v, dizziness, syncope, edema, weight gain, or early satiety.  All other systems reviewed and are otherwise negative except as noted above.  Physical Exam    VS:  BP (!) 160/70   Pulse (!) 56   Ht 5\' 8"  (1.727 m)   Wt 191 lb (86.6 kg)   SpO2 95%   BMI 29.04 kg/m  , BMI Body mass index is 29.04 kg/m. GEN: Well nourished, well developed, in no acute distress.  HEENT: normal.  Neck: Supple, no JVD, carotid bruits, or masses. Cardiac: IR, IR, soft syst murmur @ LUSB, no rubs, or gallops. No clubbing, cyanosis, trace right ankle edema.  Radials/DP/PT 2+ and equal bilaterally.  Respiratory:  Respirations regular and unlabored, clear to auscultation bilaterally. GI: Soft, nontender, nondistended, BS + x 4. MS: no deformity or atrophy. Skin: warm and dry, no rash. Neuro:  Strength and sensation are intact. Psych: Normal affect.  Accessory Clinical Findings    ECG - Atrial fibrillation, 53, no acute ST or T changes.  Assessment & Plan    1.  Hypertensive heart disease: Patient has been willing to be compliant with his medications at home. In that setting, his pressures have been better controlled and averaging somewhere in the 140s with swings between the 1 teens and 170s. His pressure is 160/70 today. Given the variability that he's had at home, I have recommended his wife continue to follow his blood pressures twice a day as she is doing. I have asked her to call us if pressures are consistently greater than 150, at which point, we can increase his clonidine to 0.2 mg bid.  OTW, continue beta blocker, ACE inhibitor, and prn torsemide.  2.  Chronic combined systolic/diastolic congestive heart failure: EF 40-45%  by echo in 2016. Volume and weight have been stable. Continue beta blocker, ACE inhibitor, and torsemide (taking prn wt gain).  3. Permanent atrial fibrillation: Rate controlled. He is now taking beta blocker. He is also taking Xarelto.  4. Noncompliance: Patient has been taking his medications as prescribed over the past month.  5. Disposition: Patient's wife or call us if blood pressures are trending over 150. Otherwise, he will follow-up heart failure clinic in 3 months and we will arrange for follow-up with Dr. Mitzi Hansen in 6 months.  Murray Hodgkins, NP 12/18/2015, 3:37 PM

## 2015-12-18 NOTE — Patient Instructions (Signed)
Continue weighing daily and call for an overnight weight gain of > 2 pounds or a weekly weight gain of >5 pounds. 

## 2015-12-18 NOTE — Patient Instructions (Addendum)
Medication Instructions:  Please continue your current medications Please monitor your BP and call if readings remain elevated  Labwork: None  Testing/Procedures: None  Follow-Up: Your physician wants you to follow-up in: 6 months  You will receive a reminder letter in the mail two months in advance.  If you don't receive a letter, please call our office to schedule the follow-up appointment.   If you need a refill on your cardiac medications before your next appointment, please call your pharmacy.

## 2015-12-20 ENCOUNTER — Emergency Department: Payer: Commercial Managed Care - HMO

## 2015-12-20 ENCOUNTER — Inpatient Hospital Stay
Admission: EM | Admit: 2015-12-20 | Discharge: 2015-12-22 | DRG: 291 | Disposition: A | Discharge status: Home Health | Payer: Commercial Managed Care - HMO | Attending: Specialist | Admitting: Specialist

## 2015-12-20 ENCOUNTER — Encounter: Payer: Self-pay | Admitting: Emergency Medicine

## 2015-12-20 DIAGNOSIS — Z833 Family history of diabetes mellitus: Secondary | ICD-10-CM

## 2015-12-20 DIAGNOSIS — Z794 Long term (current) use of insulin: Secondary | ICD-10-CM

## 2015-12-20 DIAGNOSIS — E1122 Type 2 diabetes mellitus with diabetic chronic kidney disease: Secondary | ICD-10-CM | POA: Diagnosis present

## 2015-12-20 DIAGNOSIS — N183 Chronic kidney disease, stage 3 unspecified: Secondary | ICD-10-CM | POA: Diagnosis present

## 2015-12-20 DIAGNOSIS — I4819 Other persistent atrial fibrillation: Secondary | ICD-10-CM | POA: Diagnosis present

## 2015-12-20 DIAGNOSIS — I5043 Acute on chronic combined systolic (congestive) and diastolic (congestive) heart failure: Secondary | ICD-10-CM | POA: Diagnosis present

## 2015-12-20 DIAGNOSIS — K219 Gastro-esophageal reflux disease without esophagitis: Secondary | ICD-10-CM | POA: Diagnosis present

## 2015-12-20 DIAGNOSIS — Z888 Allergy status to other drugs, medicaments and biological substances status: Secondary | ICD-10-CM

## 2015-12-20 DIAGNOSIS — Z7982 Long term (current) use of aspirin: Secondary | ICD-10-CM

## 2015-12-20 DIAGNOSIS — R0603 Acute respiratory distress: Secondary | ICD-10-CM | POA: Diagnosis present

## 2015-12-20 DIAGNOSIS — R001 Bradycardia, unspecified: Secondary | ICD-10-CM | POA: Diagnosis not present

## 2015-12-20 DIAGNOSIS — Z881 Allergy status to other antibiotic agents status: Secondary | ICD-10-CM

## 2015-12-20 DIAGNOSIS — Z9119 Patient's noncompliance with other medical treatment and regimen: Secondary | ICD-10-CM

## 2015-12-20 DIAGNOSIS — Z79899 Other long term (current) drug therapy: Secondary | ICD-10-CM

## 2015-12-20 DIAGNOSIS — E876 Hypokalemia: Secondary | ICD-10-CM | POA: Diagnosis present

## 2015-12-20 DIAGNOSIS — I482 Chronic atrial fibrillation: Secondary | ICD-10-CM | POA: Diagnosis present

## 2015-12-20 DIAGNOSIS — I119 Hypertensive heart disease without heart failure: Secondary | ICD-10-CM

## 2015-12-20 DIAGNOSIS — J9601 Acute respiratory failure with hypoxia: Secondary | ICD-10-CM

## 2015-12-20 DIAGNOSIS — D638 Anemia in other chronic diseases classified elsewhere: Secondary | ICD-10-CM | POA: Diagnosis present

## 2015-12-20 DIAGNOSIS — Z8249 Family history of ischemic heart disease and other diseases of the circulatory system: Secondary | ICD-10-CM

## 2015-12-20 DIAGNOSIS — Z7901 Long term (current) use of anticoagulants: Secondary | ICD-10-CM

## 2015-12-20 DIAGNOSIS — I130 Hypertensive heart and chronic kidney disease with heart failure and stage 1 through stage 4 chronic kidney disease, or unspecified chronic kidney disease: Principal | ICD-10-CM | POA: Diagnosis present

## 2015-12-20 DIAGNOSIS — E785 Hyperlipidemia, unspecified: Secondary | ICD-10-CM | POA: Diagnosis present

## 2015-12-20 DIAGNOSIS — I509 Heart failure, unspecified: Secondary | ICD-10-CM

## 2015-12-20 DIAGNOSIS — Z87891 Personal history of nicotine dependence: Secondary | ICD-10-CM

## 2015-12-20 LAB — COMPREHENSIVE METABOLIC PANEL
ALT: 15 U/L — ABNORMAL LOW (ref 17–63)
ANION GAP: 8 (ref 5–15)
AST: 26 U/L (ref 15–41)
Albumin: 3.3 g/dL — ABNORMAL LOW (ref 3.5–5.0)
Alkaline Phosphatase: 87 U/L (ref 38–126)
BILIRUBIN TOTAL: 0.9 mg/dL (ref 0.3–1.2)
BUN: 25 mg/dL — AB (ref 6–20)
CHLORIDE: 104 mmol/L (ref 101–111)
CO2: 29 mmol/L (ref 22–32)
Calcium: 9.1 mg/dL (ref 8.9–10.3)
Creatinine, Ser: 1.86 mg/dL — ABNORMAL HIGH (ref 0.61–1.24)
GFR, EST AFRICAN AMERICAN: 37 mL/min — AB (ref >60)
GFR, EST NON AFRICAN AMERICAN: 32 mL/min — AB (ref >60)
Glucose, Bld: 157 mg/dL — ABNORMAL HIGH (ref 65–99)
POTASSIUM: 3.2 mmol/L — AB (ref 3.5–5.1)
Sodium: 141 mmol/L (ref 135–145)
TOTAL PROTEIN: 7.3 g/dL (ref 6.5–8.1)

## 2015-12-20 LAB — CBC WITH DIFFERENTIAL/PLATELET
BASOS ABS: 0.1 10*3/uL (ref 0–0.1)
Basophils Relative: 1 %
EOS PCT: 3 %
Eosinophils Absolute: 0.2 10*3/uL (ref 0–0.7)
HEMATOCRIT: 32.5 % — AB (ref 40.0–52.0)
HEMOGLOBIN: 11.3 g/dL — AB (ref 13.0–18.0)
LYMPHS ABS: 1 10*3/uL (ref 1.0–3.6)
LYMPHS PCT: 11 %
MCH: 31 pg (ref 26.0–34.0)
MCHC: 34.8 g/dL (ref 32.0–36.0)
MCV: 89.1 fL (ref 80.0–100.0)
Monocytes Absolute: 0.4 10*3/uL (ref 0.2–1.0)
Monocytes Relative: 5 %
NEUTROS ABS: 7.3 10*3/uL — AB (ref 1.4–6.5)
NEUTROS PCT: 80 %
PLATELETS: 331 10*3/uL (ref 150–440)
RBC: 3.64 MIL/uL — AB (ref 4.40–5.90)
RDW: 15.3 % — ABNORMAL HIGH (ref 11.5–14.5)
WBC: 9 10*3/uL (ref 3.8–10.6)

## 2015-12-20 LAB — BRAIN NATRIURETIC PEPTIDE: B NATRIURETIC PEPTIDE 5: 738 pg/mL — AB (ref 0.0–100.0)

## 2015-12-20 LAB — TROPONIN I: Troponin I: 0.03 ng/mL (ref <0.03)

## 2015-12-20 LAB — LACTIC ACID, PLASMA: Lactic Acid, Venous: 1.3 mmol/L (ref 0.5–1.9)

## 2015-12-20 MED ORDER — FUROSEMIDE 10 MG/ML IJ SOLN
60.0000 mg | Freq: Once | INTRAMUSCULAR | Status: AC
  Administered 2015-12-20: 60 mg via INTRAVENOUS
  Filled 2015-12-20: qty 8

## 2015-12-20 NOTE — ED Notes (Signed)
Patient transported to X-ray 

## 2015-12-20 NOTE — ED Notes (Signed)
Patient ambulated in hallways and dropped SpO2 to 85% room air.  Patient became short of breath.  Patient assisted back into bed and placed on 2 L [place] where he increased saturation to 97%. MD notified.

## 2015-12-20 NOTE — ED Triage Notes (Signed)
Per pt he was treated a week ago for pneumonia and was given 2 different abx, and has completed the course of tx.  He comes to ER tonight with difficulty breathing satting at 97 on RA.  Patient does not appear to be in any distress.

## 2015-12-21 ENCOUNTER — Inpatient Hospital Stay (HOSPITAL_COMMUNITY)
Admit: 2015-12-21 | Discharge: 2015-12-21 | Disposition: A | Discharge status: Home / Self Care | Payer: Commercial Managed Care - HMO | Attending: Family Medicine | Admitting: Family Medicine

## 2015-12-21 ENCOUNTER — Inpatient Hospital Stay: Payer: Commercial Managed Care - HMO

## 2015-12-21 DIAGNOSIS — Z794 Long term (current) use of insulin: Secondary | ICD-10-CM | POA: Diagnosis not present

## 2015-12-21 DIAGNOSIS — J9601 Acute respiratory failure with hypoxia: Secondary | ICD-10-CM | POA: Diagnosis present

## 2015-12-21 DIAGNOSIS — I130 Hypertensive heart and chronic kidney disease with heart failure and stage 1 through stage 4 chronic kidney disease, or unspecified chronic kidney disease: Secondary | ICD-10-CM | POA: Diagnosis present

## 2015-12-21 DIAGNOSIS — E1122 Type 2 diabetes mellitus with diabetic chronic kidney disease: Secondary | ICD-10-CM | POA: Diagnosis present

## 2015-12-21 DIAGNOSIS — I481 Persistent atrial fibrillation: Secondary | ICD-10-CM

## 2015-12-21 DIAGNOSIS — R0600 Dyspnea, unspecified: Secondary | ICD-10-CM

## 2015-12-21 DIAGNOSIS — Z833 Family history of diabetes mellitus: Secondary | ICD-10-CM | POA: Diagnosis not present

## 2015-12-21 DIAGNOSIS — R0603 Acute respiratory distress: Secondary | ICD-10-CM | POA: Diagnosis present

## 2015-12-21 DIAGNOSIS — I110 Hypertensive heart disease with heart failure: Secondary | ICD-10-CM

## 2015-12-21 DIAGNOSIS — Z7982 Long term (current) use of aspirin: Secondary | ICD-10-CM | POA: Diagnosis not present

## 2015-12-21 DIAGNOSIS — E785 Hyperlipidemia, unspecified: Secondary | ICD-10-CM | POA: Diagnosis present

## 2015-12-21 DIAGNOSIS — Z87891 Personal history of nicotine dependence: Secondary | ICD-10-CM | POA: Diagnosis not present

## 2015-12-21 DIAGNOSIS — Z7901 Long term (current) use of anticoagulants: Secondary | ICD-10-CM | POA: Diagnosis not present

## 2015-12-21 DIAGNOSIS — E876 Hypokalemia: Secondary | ICD-10-CM | POA: Diagnosis present

## 2015-12-21 DIAGNOSIS — D638 Anemia in other chronic diseases classified elsewhere: Secondary | ICD-10-CM

## 2015-12-21 DIAGNOSIS — I509 Heart failure, unspecified: Secondary | ICD-10-CM | POA: Diagnosis present

## 2015-12-21 DIAGNOSIS — Z8249 Family history of ischemic heart disease and other diseases of the circulatory system: Secondary | ICD-10-CM | POA: Diagnosis not present

## 2015-12-21 DIAGNOSIS — Z9119 Patient's noncompliance with other medical treatment and regimen: Secondary | ICD-10-CM | POA: Diagnosis not present

## 2015-12-21 DIAGNOSIS — R001 Bradycardia, unspecified: Secondary | ICD-10-CM | POA: Diagnosis not present

## 2015-12-21 DIAGNOSIS — I5043 Acute on chronic combined systolic (congestive) and diastolic (congestive) heart failure: Secondary | ICD-10-CM | POA: Diagnosis present

## 2015-12-21 DIAGNOSIS — N183 Chronic kidney disease, stage 3 (moderate): Secondary | ICD-10-CM

## 2015-12-21 DIAGNOSIS — Z888 Allergy status to other drugs, medicaments and biological substances status: Secondary | ICD-10-CM | POA: Diagnosis not present

## 2015-12-21 DIAGNOSIS — I482 Chronic atrial fibrillation: Secondary | ICD-10-CM | POA: Diagnosis present

## 2015-12-21 DIAGNOSIS — Z79899 Other long term (current) drug therapy: Secondary | ICD-10-CM | POA: Diagnosis not present

## 2015-12-21 DIAGNOSIS — K219 Gastro-esophageal reflux disease without esophagitis: Secondary | ICD-10-CM | POA: Diagnosis present

## 2015-12-21 DIAGNOSIS — Z881 Allergy status to other antibiotic agents status: Secondary | ICD-10-CM | POA: Diagnosis not present

## 2015-12-21 DIAGNOSIS — I119 Hypertensive heart disease without heart failure: Secondary | ICD-10-CM

## 2015-12-21 LAB — MAGNESIUM: MAGNESIUM: 2 mg/dL (ref 1.7–2.4)

## 2015-12-21 LAB — URINALYSIS COMPLETE WITH MICROSCOPIC (ARMC ONLY)
BILIRUBIN URINE: NEGATIVE
Bacteria, UA: NONE SEEN
GLUCOSE, UA: NEGATIVE mg/dL
KETONES UR: NEGATIVE mg/dL
Leukocytes, UA: NEGATIVE
Nitrite: NEGATIVE
Protein, ur: NEGATIVE mg/dL
SPECIFIC GRAVITY, URINE: 1.004 — AB (ref 1.005–1.030)
Squamous Epithelial / LPF: NONE SEEN
pH: 5 (ref 5.0–8.0)

## 2015-12-21 LAB — TSH: TSH: 0.859 u[IU]/mL (ref 0.350–4.500)

## 2015-12-21 LAB — GLUCOSE, CAPILLARY
GLUCOSE-CAPILLARY: 149 mg/dL — AB (ref 65–99)
GLUCOSE-CAPILLARY: 159 mg/dL — AB (ref 65–99)

## 2015-12-21 LAB — TROPONIN I
TROPONIN I: 0.03 ng/mL — AB (ref <0.03)
TROPONIN I: 0.03 ng/mL — AB (ref <0.03)
Troponin I: 0.03 ng/mL (ref <0.03)

## 2015-12-21 MED ORDER — POTASSIUM CHLORIDE CRYS ER 20 MEQ PO TBCR
40.0000 meq | EXTENDED_RELEASE_TABLET | Freq: Two times a day (BID) | ORAL | Status: AC
  Administered 2015-12-21 – 2015-12-22 (×4): 40 meq via ORAL
  Filled 2015-12-21 (×4): qty 2

## 2015-12-21 MED ORDER — ACETAMINOPHEN 325 MG PO TABS: 650.0000 mg | ORAL_TABLET | ORAL | Status: DC | PRN

## 2015-12-21 MED ORDER — CLONIDINE HCL 0.1 MG PO TABS
0.1000 mg | ORAL_TABLET | Freq: Two times a day (BID) | ORAL | Status: DC
  Administered 2015-12-21 – 2015-12-22 (×4): 0.1 mg via ORAL
  Filled 2015-12-21 (×4): qty 1

## 2015-12-21 MED ORDER — SODIUM CHLORIDE 0.9% FLUSH
3.0000 mL | Freq: Two times a day (BID) | INTRAVENOUS | Status: DC
  Administered 2015-12-21 – 2015-12-22 (×4): 3 mL via INTRAVENOUS

## 2015-12-21 MED ORDER — INSULIN ASPART 100 UNIT/ML ~~LOC~~ SOLN: 0.0000 [IU] | Freq: Every day | SUBCUTANEOUS | Status: DC

## 2015-12-21 MED ORDER — PANTOPRAZOLE SODIUM 40 MG PO TBEC
40.0000 mg | DELAYED_RELEASE_TABLET | Freq: Every day | ORAL | Status: DC
  Administered 2015-12-21 – 2015-12-22 (×2): 40 mg via ORAL
  Filled 2015-12-21 (×2): qty 1

## 2015-12-21 MED ORDER — RIVAROXABAN 15 MG PO TABS
15.0000 mg | ORAL_TABLET | Freq: Every day | ORAL | Status: DC
  Administered 2015-12-21: 15 mg via ORAL
  Filled 2015-12-21: qty 1

## 2015-12-21 MED ORDER — SODIUM CHLORIDE 0.9 % IV SOLN: 250.0000 mL | INTRAVENOUS | Status: DC | PRN

## 2015-12-21 MED ORDER — FUROSEMIDE 10 MG/ML IJ SOLN
20.0000 mg | Freq: Every day | INTRAMUSCULAR | Status: DC
  Administered 2015-12-21 – 2015-12-22 (×2): 20 mg via INTRAVENOUS
  Filled 2015-12-21 (×2): qty 2

## 2015-12-21 MED ORDER — SODIUM CHLORIDE 0.9% FLUSH: 3.0000 mL | INTRAVENOUS | Status: DC | PRN

## 2015-12-21 MED ORDER — METOPROLOL TARTRATE 25 MG PO TABS
25.0000 mg | ORAL_TABLET | Freq: Two times a day (BID) | ORAL | Status: DC
  Administered 2015-12-21 – 2015-12-22 (×3): 25 mg via ORAL
  Filled 2015-12-21 (×3): qty 1

## 2015-12-21 MED ORDER — ASPIRIN EC 81 MG PO TBEC
81.0000 mg | DELAYED_RELEASE_TABLET | Freq: Every day | ORAL | Status: DC
  Administered 2015-12-21 – 2015-12-22 (×2): 81 mg via ORAL
  Filled 2015-12-21 (×2): qty 1

## 2015-12-21 MED ORDER — METOPROLOL TARTRATE 50 MG PO TABS
50.0000 mg | ORAL_TABLET | Freq: Two times a day (BID) | ORAL | Status: DC
  Administered 2015-12-21: 50 mg via ORAL
  Filled 2015-12-21: qty 1

## 2015-12-21 MED ORDER — PRAVASTATIN SODIUM 40 MG PO TABS
80.0000 mg | ORAL_TABLET | Freq: Every day | ORAL | Status: DC
  Administered 2015-12-21 – 2015-12-22 (×2): 80 mg via ORAL
  Filled 2015-12-21 (×2): qty 2

## 2015-12-21 MED ORDER — ENALAPRIL MALEATE 5 MG PO TABS
5.0000 mg | ORAL_TABLET | Freq: Every day | ORAL | Status: DC
  Administered 2015-12-21 – 2015-12-22 (×2): 5 mg via ORAL
  Filled 2015-12-21 (×2): qty 1

## 2015-12-21 MED ORDER — INSULIN ASPART 100 UNIT/ML ~~LOC~~ SOLN
0.0000 [IU] | Freq: Three times a day (TID) | SUBCUTANEOUS | Status: DC
  Administered 2015-12-21: 1 [IU] via SUBCUTANEOUS
  Administered 2015-12-22 (×2): 2 [IU] via SUBCUTANEOUS
  Filled 2015-12-21 (×2): qty 1
  Filled 2015-12-21: qty 2

## 2015-12-21 MED ORDER — ONDANSETRON HCL 4 MG/2ML IJ SOLN: 4.0000 mg | Freq: Four times a day (QID) | INTRAMUSCULAR | Status: DC | PRN

## 2015-12-21 NOTE — Care Management (Signed)
Patient admitted with exacerbation of congestive heart failure.  He is followed by [facility].  Has been treated as an outpatient for the past week and a half  for pneumonia. In the ED 02 sats upper nineties.  Currently on 02 at 2 liters. This is acute. Patient will require home 02 assessment.  Independent in all adls, denies issues accessing medical care, obtaining medications or with transportation.  Current with  PCP.

## 2015-12-21 NOTE — ED Provider Notes (Signed)
Expand All Collapse All     [facility] Emergency Department Provider Note    First MD Initiated Contact with Patient 12/20/15 1946     (approximate)  I have reviewed the triage vital signs and the nursing notes.   HISTORY  Chief Complaint Shortness of Breath    HPI Patrick [name] is a 82 y.o. male presents with 1 week of gradually worsening exertional dyspnea. Patient with a history of congestive heart failure. Denies any chest pain. No recent cough or fevers. Denies any abdominal pain. Has been taking medications as directed. States that today shortness of breath became much worse. His admitting to worsening orthopnea. States he can only walk across the room before becoming short of breath.       Past Medical History:  Diagnosis Date  . Amputated finger   . Anemia of chronic disease    a. plan of oncology to start Procrit - received during admission 12/2013  . Atypical chest pain    a. 12/2014 Neg CE - in setting of L pleural effusion.  . Bell's palsy   . Chronic combined systolic and diastolic CHF (congestive heart failure) ([place])    a. 11/2012 Echo: EF 60-65%, mod conc LVH, mildly dil LA/RA, mild Ao sclerosis w/o stenosis; b. 11/2014 Echo: EF 40-45%, prob mid-apicalanteroseptal, ant, apical HK, Gr2 DD, mod dil LA, mildly dil RA (technically difficult study).  . CKD (chronic kidney disease), stage III    a. stage III-IV  . Diabetes mellitus without complication ([place])   . GERD (gastroesophageal reflux disease)   . History of gastroesophageal reflux (GERD)   . Hyperlipidemia   . Hypertension   . Permanent atrial fibrillation ([place])    a. Dx 12/2011, Rate-controlled, chronic Xarelto (renal dosing) - CHA2DS2VASc = 5.  . Pleural effusion, left    a. 12/2014 s/p thoracentesis - protein <3, LDH 123, no malignancy.        Patient Active Problem List   Diagnosis Date Noted  . Acute combined systolic and diastolic  congestive heart failure ([place]) 12/21/2015  . Chronic diastolic heart failure ([place]) 12/18/2015  . CAP (community acquired pneumonia) 11/20/2015  . CKD (chronic kidney disease), stage III   . Acute kidney injury superimposed on chronic kidney disease ([place]) 12/23/2014  . Orthostatic hypotension 08/08/2014  . Anemia of chronic disease   . Chronic renal disease 11/26/2012  . Chronic combined systolic and diastolic CHF (congestive heart failure) ([place]) 11/26/2012  . A-fib ([place]) 01/15/2012  . Hypertension 01/15/2012  . Hyperlipidemia 01/15/2012  . Diabetes mellitus ([place]) 01/15/2012    Past Surgical History:  Procedure Laterality Date  . APPENDECTOMY    . COLONOSCOPY  09/2011  . ESOPHAGOGASTRODUODENOSCOPY    . FINGER SURGERY     right hand           Prior to Admission medications   Medication Sig Start Date End Date Taking? Authorizing Provider  aspirin 81 MG tablet Take 81 mg by mouth daily.   Yes Historical Provider, MD  cloNIDine (CATAPRES) 0.1 MG tablet Take 1 tablet (0.1 mg total) by mouth 2 (two) times daily. 09/20/15  Yes Alisa Graff, FNP  enalapril (VASOTEC) 5 MG tablet Take 1 tablet (5 mg total) by mouth daily. 09/20/15  Yes Alisa Graff, FNP  Insulin Lispro Prot & Lispro (HUMALOG MIX 75/25 [place]) Inject 70 Units into the skin 2 (two) times daily. Don't take if under 150   Yes Historical Provider, MD  metoprolol (LOPRESSOR)  50 MG tablet Take 1 tablet (50 mg total) by mouth 2 (two) times daily. 09/20/15  Yes Alisa Graff, FNP  omeprazole (PRILOSEC) 40 MG capsule Take 1 capsule (40 mg total) by mouth daily. 09/20/15  Yes Alisa Graff, FNP  pravastatin (PRAVACHOL) 40 MG tablet Take 2 tablets (80 mg total) by mouth daily. Take two tablets daily. 09/20/15  Yes Alisa Graff, FNP  Rivaroxaban (XARELTO) 15 MG TABS tablet Take 1 tablet (15 mg total) by mouth daily. 05/05/12  Yes Minna Merritts, MD  torsemide (DEMADEX) 20 MG tablet TAKE ONE TABLET BY MOUTH TWICE  DAILY AS NEEDED Patient taking differently: 20 mg 2 (two) times daily as needed.  03/21/14  Yes Minna Merritts, MD    Allergies Levofloxacin and Prednisone       Family History  Problem Relation Age of Onset  . Heart attack Brother   . Diabetes Mother   . Diabetes Sister   . Hypertension      Social History Social History  Substance Use Topics  . Smoking status: Former Smoker    Packs/day: 0.25    Years: 10.00    Types: Cigars    Quit date: 05/06/1966  . Smokeless tobacco: Never Used  . Alcohol use No    Review of Systems Patient denies headaches, rhinorrhea, blurry vision, numbness, shortness of breath, chest pain, edema, cough, abdominal pain, nausea, vomiting, diarrhea, dysuria, fevers, rashes or hallucinations unless otherwise stated above in HPI. ____________________________________________   PHYSICAL EXAM:  VITAL SIGNS:     Vitals:   12/21/15 0040 12/21/15 0100  BP: (!) 179/82 (!) 152/88  Pulse: 63 (!) 56  Resp:    Temp:      Constitutional: Alert and oriented. Well appearing and in no acute distress. Eyes: Conjunctivae are normal. PERRL. EOMI. Head: Atraumatic. Nose: No congestion/rhinnorhea. Mouth/Throat: Mucous membranes are moist.  Oropharynx non-erythematous. Neck: No stridor. Painless ROM. No cervical spine tenderness to palpation Hematological/Lymphatic/Immunilogical: No cervical lymphadenopathy. Cardiovascular: Normal rate, regular rhythm. Grossly normal heart sounds.  Good peripheral circulation. Respiratory: Normal respiratory effort.  No retractionsPosterior respiratory crackles heard bilaterally Gastrointestinal: Soft and nontender. No distention. No abdominal bruits. No CVA tenderness. Genitourinary:  Musculoskeletal: No lower extremity tenderness, 2+ pitting edema bilaterally  No joint effusions. Neurologic:  Normal speech and language. No gross focal neurologic deficits are appreciated. No gait  instability. Skin:  Skin is warm, dry and intact. No rash noted. Psychiatric: Mood and affect are normal. Speech and behavior are normal.  ____________________________________________   LABS (all labs ordered are listed, but only abnormal results are displayed)  Lab Results Last 24 Hours       Results for orders placed or performed during the hospital encounter of 12/20/15 (from the past 24 hour(s))  Comprehensive metabolic panel     Status: Abnormal   Collection Time: 12/20/15  8:17 PM  Result Value Ref Range   Sodium 141 135 - 145 mmol/L   Potassium 3.2 (L) 3.5 - 5.1 mmol/L   Chloride 104 101 - 111 mmol/L   CO2 29 22 - 32 mmol/L   Glucose, Bld 157 (H) 65 - 99 mg/dL   BUN 25 (H) 6 - 20 mg/dL   Creatinine, Ser 1.86 (H) 0.61 - 1.24 mg/dL   Calcium 9.1 8.9 - 10.3 mg/dL   Total Protein 7.3 6.5 - 8.1 g/dL   Albumin 3.3 (L) 3.5 - 5.0 g/dL   AST 26 15 - 41 U/L   ALT 15 (L)  17 - 63 U/L   Alkaline Phosphatase 87 38 - 126 U/L   Total Bilirubin 0.9 0.3 - 1.2 mg/dL   GFR calc non Af Amer 32 (L) >60 mL/min   GFR calc Af Amer 37 (L) >60 mL/min   Anion gap 8 5 - 15  CBC WITH DIFFERENTIAL     Status: Abnormal   Collection Time: 12/20/15  8:17 PM  Result Value Ref Range   WBC 9.0 3.8 - 10.6 K/uL   RBC 3.64 (L) 4.40 - 5.90 MIL/uL   Hemoglobin 11.3 (L) 13.0 - 18.0 g/dL   HCT 32.5 (L) 40.0 - 52.0 %   MCV 89.1 80.0 - 100.0 fL   MCH 31.0 26.0 - 34.0 pg   MCHC 34.8 32.0 - 36.0 g/dL   RDW 15.3 (H) 11.5 - 14.5 %   Platelets 331 150 - 440 K/uL   Neutrophils Relative % 80 %   Neutro Abs 7.3 (H) 1.4 - 6.5 K/uL   Lymphocytes Relative 11 %   Lymphs Abs 1.0 1.0 - 3.6 K/uL   Monocytes Relative 5 %   Monocytes Absolute 0.4 0.2 - 1.0 K/uL   Eosinophils Relative 3 %   Eosinophils Absolute 0.2 0 - 0.7 K/uL   Basophils Relative 1 %   Basophils Absolute 0.1 0 - 0.1 K/uL  Lactic acid, plasma     Status: None   Collection Time: 12/20/15  8:17 PM  Result Value  Ref Range   Lactic Acid, Venous 1.3 0.5 - 1.9 mmol/L  Troponin I     Status: Abnormal   Collection Time: 12/20/15  8:17 PM  Result Value Ref Range   Troponin I 0.03 (HH) <0.03 ng/mL  Urinalysis complete, with microscopic ([facility] only)     Status: Abnormal   Collection Time: 12/20/15  8:17 PM  Result Value Ref Range   Color, Urine COLORLESS (A) YELLOW   APPearance CLEAR (A) CLEAR   Glucose, UA NEGATIVE NEGATIVE mg/dL   Bilirubin Urine NEGATIVE NEGATIVE   Ketones, ur NEGATIVE NEGATIVE mg/dL   Specific Gravity, Urine 1.004 (L) 1.005 - 1.030   Hgb urine dipstick 2+ (A) NEGATIVE   pH 5.0 5.0 - 8.0   Protein, ur NEGATIVE NEGATIVE mg/dL   Nitrite NEGATIVE NEGATIVE   Leukocytes, UA NEGATIVE NEGATIVE   RBC / HPF 6-30 0 - 5 RBC/hpf   WBC, UA 0-5 0 - 5 WBC/hpf   Bacteria, UA NONE SEEN NONE SEEN   Squamous Epithelial / LPF NONE SEEN NONE SEEN  Brain natriuretic peptide     Status: Abnormal   Collection Time: 12/20/15  8:17 PM  Result Value Ref Range   B Natriuretic Peptide 738.0 (H) 0.0 - 100.0 pg/mL     ____________________________________________  EKG My review and personal interpretation at Time: 19:51   Indication: sob  Rate: 60  Rhythm: afib Axis: normal Other: non specific st changes, no acute ischemia ____________________________________________  RADIOLOGY  See chart ____________________________________________   PROCEDURES  Procedure(s) performed: none    Critical Care performed: no ____________________________________________   INITIAL IMPRESSION / ASSESSMENT AND PLAN / ED COURSE  Pertinent labs & imaging results that were available during my care of the patient were reviewed by me and considered in my medical decision making (see chart for details).  DDX: Asthma, copd, CHF, pna, ptx, malignancy, Pe, anemia   ABRIAN HANOVER is a 82 y.o. who presents to the ED with complaint of worsening exertional chest pain. Exam  consistent with congestive heart failure  exacerbation. Denies any chest pain. EKG without any acute ischemia. Troponin 0.03. Is not consistent with ACS.  The patient will be placed on continuous pulse oximetry and telemetry for monitoring.  Laboratory evaluation will be sent to evaluate for the above complaints.          Clinical Course   Comment By Time  Patient with significant diuresis after IV Lasix. Will ambulate with pulse ox to evaluate for Merlyn Lot, MD 09/27 2320  Patient ambulated with a pulse ox and desaturated down to 85% with marked tachypnea. Based on his findings do feel patient will require admission for further evaluation and management. Merlyn Lot, MD 09/27 2353     ____________________________________________   FINAL CLINICAL IMPRESSION(S) / ED DIAGNOSES  Final diagnoses:  None      NEW MEDICATIONS STARTED DURING THIS VISIT:     New Prescriptions   No medications on file     Note:  This document was prepared using Dragon voice recognition software and may include unintentional dictation errors.       Merlyn Lot, MD 12/21/15 [phone]

## 2015-12-21 NOTE — Progress Notes (Signed)
Follow-up appointment made at the [facility] on January 04, 2016 at 2:30pm. Thank you.

## 2015-12-21 NOTE — ED Notes (Signed)
Called to give report, nurse was in isolation room and will call me right back.

## 2015-12-21 NOTE — H&P (Signed)
[place] @ [facility] Admission History and Physical Harvie Bridge, D.O.  ---------------------------------------------------------------------------------------------------------------------   PATIENT NAME: Patrick [name] MR#: [id number] DATE OF BIRTH: 1931/11/10 DATE OF ADMISSION: 12/20/2015 PRIMARY CARE PHYSICIAN: Maryland Pink, MD  REQUESTING/REFERRING PHYSICIAN: ED Dr. Quentin Cornwall  CHIEF COMPLAINT: Chief Complaint  Patient presents with  . Shortness of Breath    HISTORY OF PRESENT ILLNESS: Patrick [name] is a 82 y.o. male with a known history of Hypertension, chronic combined systolic and diastolic congestive heart failure with an EF of 40-45%, atrial fibrillation, CK D. was in a usual state of health until 2 weeks ago when he was treated for pneumonia, was prescribed 2 different antibiotics which he has completed. Since he has been taking the medication he has gradually declined. He presents tonight complaining of worsening shortness of breath since his diagnosis of pneumonia, dyspnea on exertion, cough productive of clear to white phlegm. His family denies any symptoms such as fevers, chills, nausea, vomiting but they do state that he has been lethargic and not as active as usually.   Of note he did have a follow up with his cardiology nurse practitioner yesterday. He has been taking torsemide twice daily as needed for lower extremity swelling secondary to chronic kidney disease  Otherwise there has been no change in status. Patient has been taking medication as prescribed and there has been no recent change in medication or diet.  There has been no recent illness, travel or sick contacts.    Patient denies fevers/chills, weakness, dizziness, chest pain, shortness of breath, N/V/C/D, abdominal pain, dysuria/frequency, changes in mental status.   EMS/ED COURSE:    Patient received 60 mg of IV Lasix PAST MEDICAL HISTORY: Past Medical History:  Diagnosis  Date  . Amputated finger   . Anemia of chronic disease    a. plan of oncology to start Procrit - received during admission 12/2013  . Atypical chest pain    a. 12/2014 Neg CE - in setting of L pleural effusion.  . Bell's palsy   . Chronic combined systolic and diastolic CHF (congestive heart failure) ([place])    a. 11/2012 Echo: EF 60-65%, mod conc LVH, mildly dil LA/RA, mild Ao sclerosis w/o stenosis; b. 11/2014 Echo: EF 40-45%, prob mid-apicalanteroseptal, ant, apical HK, Gr2 DD, mod dil LA, mildly dil RA (technically difficult study).  . CKD (chronic kidney disease), stage III    a. stage III-IV  . Diabetes mellitus without complication ([place])   . GERD (gastroesophageal reflux disease)   . History of gastroesophageal reflux (GERD)   . Hyperlipidemia   . Hypertension   . Permanent atrial fibrillation ([place])    a. Dx 12/2011, Rate-controlled, chronic Xarelto (renal dosing) - CHA2DS2VASc = 5.  . Pleural effusion, left    a. 12/2014 s/p thoracentesis - protein <3, LDH 123, no malignancy.      PAST SURGICAL HISTORY: Past Surgical History:  Procedure Laterality Date  . APPENDECTOMY    . COLONOSCOPY  09/2011  . ESOPHAGOGASTRODUODENOSCOPY    . FINGER SURGERY     right hand      SOCIAL HISTORY: Social History  Substance Use Topics  . Smoking status: Former Smoker    Packs/day: 0.25    Years: 10.00    Types: Cigars    Quit date: 05/06/1966  . Smokeless tobacco: Never Used  . Alcohol use No      FAMILY HISTORY: Family History  Problem Relation Age of Onset  . Heart attack Brother   .  Diabetes Mother   . Diabetes Sister   . Hypertension       MEDICATIONS AT HOME: Prior to Admission medications   Medication Sig Start Date End Date Taking? Authorizing Provider  aspirin 81 MG tablet Take 81 mg by mouth daily.   Yes Historical Provider, MD  cloNIDine (CATAPRES) 0.1 MG tablet Take 1 tablet (0.1 mg total) by mouth 2 (two) times daily. 09/20/15  Yes Alisa Graff, FNP  enalapril  (VASOTEC) 5 MG tablet Take 1 tablet (5 mg total) by mouth daily. 09/20/15  Yes Alisa Graff, FNP  Insulin Lispro Prot & Lispro (HUMALOG MIX 75/25 [place]) Inject 70 Units into the skin 2 (two) times daily. Don't take if under 150   Yes Historical Provider, MD  metoprolol (LOPRESSOR) 50 MG tablet Take 1 tablet (50 mg total) by mouth 2 (two) times daily. 09/20/15  Yes Alisa Graff, FNP  omeprazole (PRILOSEC) 40 MG capsule Take 1 capsule (40 mg total) by mouth daily. 09/20/15  Yes Alisa Graff, FNP  pravastatin (PRAVACHOL) 40 MG tablet Take 2 tablets (80 mg total) by mouth daily. Take two tablets daily. 09/20/15  Yes Alisa Graff, FNP  Rivaroxaban (XARELTO) 15 MG TABS tablet Take 1 tablet (15 mg total) by mouth daily. 05/05/12  Yes Minna Merritts, MD  torsemide (DEMADEX) 20 MG tablet TAKE ONE TABLET BY MOUTH TWICE DAILY AS NEEDED Patient taking differently: 20 mg 2 (two) times daily as needed.  03/21/14  Yes Minna Merritts, MD      DRUG ALLERGIES: Allergies  Allergen Reactions  . Levofloxacin Other (See Comments)    confusion  . Prednisone Other (See Comments)    confusion     REVIEW OF SYSTEMS: CONSTITUTIONAL: No fever/chills,Positive fatigue, positive weakness, negative weight gain/loss, headache EYES: No blurry or double vision. ENT: No tinnitus, postnasal drip, redness or soreness of the oropharynx. RESPIRATORY: Positive cough, dyspnea negative. wheeze, hemoptysis, CARDIOVASCULAR: No chest pain, orthopnea, palpitations, syncope. GASTROINTESTINAL: No nausea, vomiting, constipation, diarrhea, abdominal pain, hematemesis, melena or hematochezia. GENITOURINARY: No dysuria or hematuria. ENDOCRINE: No polyuria or nocturia. No heat or cold intolerance. HEMATOLOGY: No anemia, bruising, bleeding. INTEGUMENTARY: No rashes, ulcers, lesions. MUSCULOSKELETAL: No arthritis, positive mild bilateral lower extremity swelling. NEUROLOGIC: No numbness, tingling, weakness or ataxia. No seizure-type  activity. PSYCHIATRIC: No anxiety, depression, insomnia.  PHYSICAL EXAMINATION: VITAL SIGNS: Blood pressure (!) 190/88, pulse 60, temperature 98.1 F (36.7 C), temperature source Oral, resp. rate (!) 23, height 5\' 8"  (1.727 m), weight 86.6 kg (191 lb), SpO2 97 %.  GENERAL: 82 y.o.-year-old white male patient, well-developed, well-nourished lying in the bed in no acute distress.  Pleasant and cooperative.   HEENT: Head atraumatic, normocephalic. Pupils equal, round, reactive to light and accommodation. No scleral icterus. Extraocular muscles intact. Nares are patent. Oropharynx is clear. Mucus membranes moist. NECK: Supple, full range of motion. No JVD, no bruit heard. No thyroid enlargement, no tenderness, no cervical lymphadenopathy. CHEST: Normal breath sounds bilaterally. Bibasilar crackles No use of accessory muscles of respiration.  No reproducible chest wall tenderness.  CARDIOVASCULAR: S1, S2 normal. No murmurs, rubs, or gallops. Cap refill <2 seconds. ABDOMEN: Soft, nontender, nondistended. No rebound, guarding, rigidity. Normoactive bowel sounds present in all four quadrants. No organomegaly or mass. EXTREMITIES: Full range of motion. No pedal edema, cyanosis, or clubbing. NEUROLOGIC: Cranial nerves II through XII are grossly intact with no focal sensorimotor deficit. Muscle strength 5/5 in all extremities. Sensation intact. Gait not checked. PSYCHIATRIC: The patient  is alert and oriented x 3. Normal affect, mood, thought content. SKIN: Warm, dry, and intact without obvious rash, lesion, or ulcer.  LABORATORY PANEL:  CBC  Recent Labs Lab 12/20/15 2017  WBC 9.0  HGB 11.3*  HCT 32.5*  PLT 331   ----------------------------------------------------------------------------------------------------------------- Chemistries  Recent Labs Lab 12/20/15 2017  NA 141  K 3.2*  CL 104  CO2 29  GLUCOSE 157*  BUN 25*  CREATININE 1.86*  CALCIUM 9.1  AST 26  ALT 15*  ALKPHOS 87   BILITOT 0.9   ------------------------------------------------------------------------------------------------------------------ Cardiac Enzymes  Recent Labs Lab 12/20/15 2017  TROPONINI 0.03*   ------------------------------------------------------------------------------------------------------------------  RADIOLOGY: Dg Chest 2 View  Result Date: 12/20/2015 CLINICAL DATA:  Short of breath and weakness today. History of pneumonia and COPD. EXAM: CHEST  2 VIEW COMPARISON:  11/20/2015 FINDINGS: Cardiac silhouette is borderline enlarged. No mediastinal or hilar masses or convincing adenopathy. There is irregular thickening of the interstitial markings and prominence of the bronchovascular markings most evident in the bases, similar to the prior exam. There is no focal lung consolidation. No significant pleural effusion. No pneumothorax. Skeletal structures are demineralized but grossly intact. IMPRESSION: Interstitial thickening with borderline cardiomegaly. Although this may be chronic, and mild degree of interstitial edema should be considered given the history. No evidence of pneumonia. Underlying COPD. Electronically Signed   By: Lajean Manes M.D.   On: 12/20/2015 20:20    IMPRESSION AND PLAN:  This is a 82 y.o. male with a history of hypertension, chronic combined systolic and diastolic heart failure with an EF of 40-45%, CK D, atrial fibrillation now being admitted with: 1. Acute exacerbation of congestive heart failure, combined systolic and diastolic-admit to inpatient for IV diuretics, daily weights, strict I's and O's continue enalapril, Lopressor. We'll trend troponins, check TSH and request cardiology consultation 2. Hypokalemia-replace orally. Check magnesium level. 3. Hypertension-continue Catapres, enalapril, Lopressor 4. Atrial fibrillation-rate controlled. Continue Lopressor, aspirin and Xarelto 5. Hyperlipidemia-continue Pravachol 6. GERD-continue Prilosec 7.  Diabetes-insulin sliding scale coverage and carb control diet.   Diet/Nutrition: Heart healthy, carb controlled Fluids: Hep-Lock DVT Px: Xarelto SCDs and early ambulation Code Status: Full  All the records are reviewed and case discussed with ED provider. Management plans discussed with the patient and/or family who express understanding and agree with plan of care.   TOTAL TIME TAKING CARE OF THIS PATIENT: 60 minutes.   Kinney Sackmann D.O. on 12/21/2015 at 12:24 AM Between 7am to 6pm - Pager - [phone] After 6pm go to [email or web address] - Proofreader [facility] [place] [facility] Office [phone] CC: Primary care physician; Maryland Pink, MD     Note: This dictation was prepared with Dragon dictation along with smaller phrase technology. Any transcriptional errors that result from this process are unintentional.

## 2015-12-21 NOTE — Progress Notes (Signed)
DR [name] was made aware of pt's 2-02 pause , no new order at this time

## 2015-12-21 NOTE — Discharge Instructions (Signed)
Heart Failure Clinic appointment on January 04, 2016 at 2:30pm with Darylene Price, [place]. Please call [phone] to reschedule.

## 2015-12-21 NOTE — ED Notes (Signed)
Called to give report to floor, nurse is giving trach care to patient.  Asked to give report to charge nurse.

## 2015-12-21 NOTE — Evaluation (Signed)
Physical Therapy Evaluation Patient Details Name: Patrick [name] MRN: [id number] DOB: 1931-04-03 Today's Date: 12/21/2015   History of Present Illness  Patrick [name] is a 82 y.o. male with a known history of Hypertension, chronic combined systolic and diastolic congestive heart failure with an EF of 40-45%, atrial fibrillation, CK D. was in a usual state of health until 2 weeks ago when he was treated for pneumonia, was prescribed 2 different antibiotics which he has completed. Since he has been taking the medication he has gradually declined. He presents tonight complaining of worsening shortness of breath since his diagnosis of pneumonia, dyspnea on exertion, cough productive of clear to white phlegm. His family denies any symptoms such as fevers, chills, nausea, vomiting but they do state that he has been lethargic and not as active as usually. Of note he did have a follow up with his cardiology nurse practitioner yesterday. He has been taking torsemide twice daily as needed for lower extremity swelling secondary to chronic kidney disease. Otherwise there has been no change in status. Patient has been taking medication as prescribed and there has been no recent change in medication or diet.  There has been no recent illness, travel or sick contacts. Patient denies fevers/chills, weakness, dizziness, chest pain, shortness of breath, N/V/C/D, abdominal pain, dysuria/frequency, changes in mental status. No reported falls in the last 12 months.   Clinical Impression  Pt admitted with above diagnosis. Pt currently with functional limitations due to the deficits listed below (see PT Problem List).  Pt demonstrates good stability with transfers and ambulation. He does present with some higher level balance deficits with single leg stance only 1-2 seconds bilaterally. Pt fatigued at end of ambulation and SaO2 drops to room air (see below). Pt would benefit from [facility] PT for strength, endurance, and  balance deficits. Pt will benefit from skilled PT services to address deficits in strength, balance, and mobility in order to return to full function at home.   SaO2 on room air at rest = 94% SaO2 on room air while ambulating = 87% SaO2 on 3 liters of O2 while ambulating = 91%     Follow Up Recommendations Home health PT    Equipment Recommendations  None recommended by PT    Recommendations for Other Services       Precautions / Restrictions Precautions Precautions: Fall Restrictions Weight Bearing Restrictions: No      Mobility  Bed Mobility               General bed mobility comments: Received up in recliner and left in chair. Not assessed at this time  Transfers Overall transfer level: Needs assistance Equipment used: None Transfers: Sit to/from Stand Sit to Stand: Supervision         General transfer comment: Pt demonstrates fair speed/sequencing with sit to stand transfers. Good stability once upright in standing without UE support  Ambulation/Gait Ambulation/Gait assistance: Min guard Ambulation Distance (Feet): 220 Feet Assistive device: None Gait Pattern/deviations: Decreased step length - right;Decreased step length - left Gait velocity: Decreased Gait velocity interpretation: <1.8 ft/sec, indicative of risk for recurrent falls General Gait Details: Pt able to ambulate full lap around RN station without assistive device. Vitals monitored and SaO2 drops to 87% on room air. Standing rest break with pursed lip breathing provided by SaO2 remains at 87%. Supplemental O2 reapplied at 2L/min and SaO2 still does not recover. O2 increased to 3L/min and SaO2 improves to 91%. By the end of ambulation pt appears  visibly fatigued.   Stairs            Wheelchair Mobility    Modified Rankin (Stroke Patients Only)       Balance Overall balance assessment: Needs assistance Sitting-balance support: No upper extremity supported Sitting balance-Leahy  Scale: Good     Standing balance support: No upper extremity supported Standing balance-Leahy Scale: Fair                               Pertinent Vitals/Pain Pain Assessment: No/denies pain    Home Living Family/patient expects to be discharged to:: Private residence Living Arrangements: Spouse/significant other;Children (Adult son) Available Help at Discharge: Family (Wife works 6 hours/day) Type of Home: House Home Access: Stairs to enter Entrance Stairs-Rails: Horticulturist, commercial of Steps: 4 Home Layout: One level Home Equipment: Shower seat - built in;Walker - 2 wheels;Cane - quad;Grab bars - tub/shower Additional Comments: no BSC, no hospital bed, no wheelchair    Prior Function Level of Independence: Independent         Comments: limited community mobility. Independent with ADLs/IADLs     Hand Dominance   Dominant Hand: Right    Extremity/Trunk Assessment   Upper Extremity Assessment: Overall WFL for tasks assessed           Lower Extremity Assessment: Overall WFL for tasks assessed         Communication   Communication: No difficulties  Cognition Arousal/Alertness: Awake/alert Behavior During Therapy: WFL for tasks assessed/performed Overall Cognitive Status: Within Functional Limits for tasks assessed                      General Comments      Exercises     Assessment/Plan    PT Assessment Patient needs continued PT services  PT Problem List Decreased strength;Decreased activity tolerance;Decreased balance;Cardiopulmonary status limiting activity          PT Treatment Interventions DME instruction;Gait training;Stair training;Therapeutic activities;Therapeutic exercise;Balance training;Neuromuscular re-education;Patient/family education    PT Goals (Current goals can be found in the Care Plan section)  Acute Rehab PT Goals Patient Stated Goal: Return to prior level of function at home PT Goal Formulation:  With patient/family Time For Goal Achievement: 01/04/16 Potential to Achieve Goals: Good    Frequency Min 2X/week   Barriers to discharge        Co-evaluation               End of Session Equipment Utilized During Treatment: Gait belt Activity Tolerance: Patient tolerated treatment well Patient left: in chair;with call bell/phone within reach;with chair alarm set Armed forces logistics/support/administrative officer with patient) Nurse Communication: Mobility status         Time: 1540-1600 PT Time Calculation (min) (ACUTE ONLY): 20 min   Charges:   PT Evaluation $PT Eval Low Complexity: 1 Procedure PT Treatments $Gait Training: 8-22 mins   PT G Codes:       Lyndel Safe Huprich PT, DPT   Huprich,Jason 12/21/2015, 4:43 PM

## 2015-12-21 NOTE — Consult Note (Signed)
Cardiology Consultation Note  Patient ID: HANCEL ION, MRN: [id number], DOB/AGE: 1931/05/08 82 y.o. Admit date: 12/20/2015   Date of Consult: 12/21/2015 Primary Physician: Maryland Pink, MD Primary Cardiologist: Dr. [name], MD Requesting Physician: Dr. Ara Kussmaul, MD  Chief Complaint: SOB Reason for Consult: Acute on chronic combined CHF  HPI: 82 y.o. male with h/o chronic combined CHF, persistent Afib on Xarelto, CKD stage III-IV, left transudative pleural effusion s/p thoracentesis 12/2014, recent PNA, anemia of chronic disease, and hypertensive heart disease who presented to [facility] with a 2 day history of increased SOB.   He was recently seen in our clinic on 9/25 and noted to have not had any SOB, orthopnea, chest pain, or LE swelling. He had recently restarted all of his medications as he self discontinued them in 10/2015. BP was still somewhat variable with readings from the 1-teens to 614'E systolic. Weight on 9/25 was 191 pounds. He takes torsemide prn and his wife reports he last needed to take this medication the week prior.   Over the past 2 days he began to note an increase in SOB and nonproductive cough. No LE swelling or increase in weight. No orthopnea or early satiety. No chest pain. He did not take any torsemide at home prior to coming to the ED.  Upon the patient's arrival to [facility] his weight was noted to be down 9 pounds from 9/25. BNP was 738, troponin mildly elevated and flat trending peaking at 0.04. SCr 1.86, BUN 25, K+ 3.2. WBC 9.0, hgb 11.3. Lactic acid 1.3. Blood cultures pending x 2. ECG Afib, 57 bpm, no acute st/t changes, CXR showed chronic interstitial vascular congestion with a possible mild degree of interstitial edema. He was given IV Lasix 60 mg in the ED and another 20 mg at time of admission with a UOP documented of minus 300 mL, though he reports voiding 6 times overnight. Currently less SOB.    Past Medical History:  Diagnosis Date  . Amputated finger    . Anemia of chronic disease    a. plan of oncology to start Procrit - received during admission 12/2013  . Atypical chest pain    a. 12/2014 Neg CE - in setting of L pleural effusion.  . Bell's palsy   . Chronic combined systolic and diastolic CHF (congestive heart failure) ([place])    a. 11/2012 Echo: EF 60-65%, mod conc LVH, mildly dil LA/RA, mild Ao sclerosis w/o stenosis; b. 11/2014 Echo: EF 40-45%, prob mid-apicalanteroseptal, ant, apical HK, Gr2 DD, mod dil LA, mildly dil RA (technically difficult study).  . CKD (chronic kidney disease), stage III    a. stage III-IV  . Diabetes mellitus without complication ([place])   . GERD (gastroesophageal reflux disease)   . History of gastroesophageal reflux (GERD)   . Hyperlipidemia   . Hypertension   . Permanent atrial fibrillation ([place])    a. Dx 12/2011, Rate-controlled, chronic Xarelto (renal dosing) - CHA2DS2VASc = 5.  . Pleural effusion, left    a. 12/2014 s/p thoracentesis - protein <3, LDH 123, no malignancy.      Most Recent Cardiac Studies: Echo 11/2014: Study Conclusions  - Procedure narrative: Transthoracic echocardiography. The study   was technically difficult. - Left ventricle: The cavity size was normal. There was moderate   concentric hypertrophy. Systolic function was mildly to   moderately reduced. The estimated ejection fraction was in the   range of 40% to 45%. Probable hypokinesis of the   mid-apicalanteroseptal, anterior, and apical myocardium.  Features   are consistent with a pseudonormal left ventricular filling   pattern, with concomitant abnormal relaxation and increased   filling pressure (grade 2 diastolic dysfunction). - Left atrium: The atrium was moderately dilated. - Right atrium: The atrium was mildly dilated.   Surgical History:  Past Surgical History:  Procedure Laterality Date  . APPENDECTOMY    . COLONOSCOPY  09/2011  . ESOPHAGOGASTRODUODENOSCOPY    . FINGER SURGERY     right hand     Home  Meds: Prior to Admission medications   Medication Sig Start Date End Date Taking? Authorizing Provider  aspirin 81 MG tablet Take 81 mg by mouth daily.   Yes Historical Provider, MD  cloNIDine (CATAPRES) 0.1 MG tablet Take 1 tablet (0.1 mg total) by mouth 2 (two) times daily. 09/20/15  Yes Alisa Graff, FNP  enalapril (VASOTEC) 5 MG tablet Take 1 tablet (5 mg total) by mouth daily. 09/20/15  Yes Alisa Graff, FNP  Insulin Lispro Prot & Lispro (HUMALOG MIX 75/25 [place]) Inject 70 Units into the skin 2 (two) times daily. Don't take if under 150   Yes Historical Provider, MD  metoprolol (LOPRESSOR) 50 MG tablet Take 1 tablet (50 mg total) by mouth 2 (two) times daily. 09/20/15  Yes Alisa Graff, FNP  omeprazole (PRILOSEC) 40 MG capsule Take 1 capsule (40 mg total) by mouth daily. 09/20/15  Yes Alisa Graff, FNP  pravastatin (PRAVACHOL) 40 MG tablet Take 2 tablets (80 mg total) by mouth daily. Take two tablets daily. 09/20/15  Yes Alisa Graff, FNP  Rivaroxaban (XARELTO) 15 MG TABS tablet Take 1 tablet (15 mg total) by mouth daily. 05/05/12  Yes Minna Merritts, MD  torsemide (DEMADEX) 20 MG tablet TAKE ONE TABLET BY MOUTH TWICE DAILY AS NEEDED Patient taking differently: 20 mg 2 (two) times daily as needed.  03/21/14  Yes Minna Merritts, MD    Inpatient Medications:  . aspirin EC  81 mg Oral Daily  . cloNIDine  0.1 mg Oral BID  . enalapril  5 mg Oral Daily  . furosemide  20 mg Intravenous Daily  . metoprolol  50 mg Oral BID  . pantoprazole  40 mg Oral QAC breakfast  . potassium chloride  40 mEq Oral BID  . pravastatin  80 mg Oral Daily  . Rivaroxaban  15 mg Oral Q supper  . sodium chloride flush  3 mL Intravenous Q12H      Allergies:  Allergies  Allergen Reactions  . Levofloxacin Other (See Comments)    confusion  . Prednisone Other (See Comments)    confusion    Social History   Social History  . Marital status: Married    Spouse name: N/A  . Number of children: N/A  .  Years of education: N/A   Occupational History  . Not on file.   Social History Main Topics  . Smoking status: Former Smoker    Packs/day: 0.25    Years: 10.00    Types: Cigars    Quit date: 05/06/1966  . Smokeless tobacco: Never Used  . Alcohol use No  . Drug use: No  . Sexual activity: Not on file   Other Topics Concern  . Not on file   Social History Narrative   Lives in [place] with his wife.     Family History  Problem Relation Age of Onset  . Heart attack Brother   . Diabetes Mother   . Diabetes Sister   .  Hypertension       Review of Systems: Review of Systems  Constitutional: Positive for malaise/fatigue. Negative for chills, diaphoresis, fever and weight loss.  HENT: Negative for congestion.   Eyes: Negative for discharge and redness.  Respiratory: Positive for cough and shortness of breath. Negative for sputum production and wheezing.   Cardiovascular: Negative for chest pain, palpitations, orthopnea, claudication, leg swelling and PND.  Gastrointestinal: Negative for abdominal pain, heartburn, nausea and vomiting.  Musculoskeletal: Negative for falls and myalgias.  Skin: Negative for rash.  Neurological: Positive for weakness. Negative for dizziness, tingling, tremors, sensory change, speech change, focal weakness and loss of consciousness.  Endo/Heme/Allergies: Does not bruise/bleed easily.  Psychiatric/Behavioral: Negative for substance abuse. The patient is not nervous/anxious.   All other systems reviewed and are negative.   Labs:  Recent Labs  12/20/15 2017 12/21/15 0319  TROPONINI 0.03* 0.03*   Lab Results  Component Value Date   WBC 9.0 12/20/2015   HGB 11.3 (L) 12/20/2015   HCT 32.5 (L) 12/20/2015   MCV 89.1 12/20/2015   PLT 331 12/20/2015     Recent Labs Lab 12/20/15 2017  NA 141  K 3.2*  CL 104  CO2 29  BUN 25*  CREATININE 1.86*  CALCIUM 9.1  PROT 7.3  BILITOT 0.9  ALKPHOS 87  ALT 15*  AST 26  GLUCOSE 157*   Lab  Results  Component Value Date   CHOL 81 01/03/2014   HDL 16 (L) 01/03/2014   LDLCALC 43 01/03/2014   TRIG 111 01/03/2014   No results found for: DDIMER  Radiology/Studies:  Dg Chest 2 View  Result Date: 12/20/2015 CLINICAL DATA:  Short of breath and weakness today. History of pneumonia and COPD. EXAM: CHEST  2 VIEW COMPARISON:  11/20/2015 FINDINGS: Cardiac silhouette is borderline enlarged. No mediastinal or hilar masses or convincing adenopathy. There is irregular thickening of the interstitial markings and prominence of the bronchovascular markings most evident in the bases, similar to the prior exam. There is no focal lung consolidation. No significant pleural effusion. No pneumothorax. Skeletal structures are demineralized but grossly intact. IMPRESSION: Interstitial thickening with borderline cardiomegaly. Although this may be chronic, and mild degree of interstitial edema should be considered given the history. No evidence of pneumonia. Underlying COPD. Electronically Signed   By: Lajean Manes M.D.   On: 12/20/2015 20:20    EKG: Interpreted by me showed: Afib, 57 bpm, no acute st/t changes Telemetry: Interpreted by me showed: Afib, upper 30's to 50's bpm  Weights: Filed Weights   12/20/15 2002 12/21/15 0225 12/21/15 0520  Weight: 191 lb (86.6 kg) 182 lb 3.2 oz (82.6 kg) 182 lb 11.2 oz (82.9 kg)     Physical Exam: Blood pressure (!) 133/57, pulse (!) 46, temperature 98.3 F (36.8 C), temperature source Oral, resp. rate 18, height 5\' 8"  (1.727 m), weight 182 lb 11.2 oz (82.9 kg), SpO2 100 %. Body mass index is 27.78 kg/m. General: Well developed, well nourished, in no acute distress. Head: Normocephalic, atraumatic, sclera non-icteric, no xanthomas, nares are without discharge.  Neck: Negative for carotid bruits. JVD not elevated. Lungs: Crackles bilateral lungs midway up. Breathing is unlabored. Heart: Bradycardic, irregularly-irregular with S1 S2. No murmurs, rubs, or gallops  appreciated. Abdomen: Soft, non-tender, non-distended with normoactive bowel sounds. No hepatomegaly. No rebound/guarding. No obvious abdominal masses. Msk:  Strength and tone appear normal for age. Extremities: No clubbing or cyanosis. No edema. Distal pedal pulses are 2+ and equal bilaterally. Neuro: Alert and oriented  X 3. No facial asymmetry. No focal deficit. Moves all extremities spontaneously. Psych:  Responds to questions appropriately with a normal affect.    Assessment and Plan:  Principal Problem:   Acute respiratory distress (HCC) Active Problems:   CKD (chronic kidney disease), stage III   Acute on chronic combined systolic and diastolic CHF (congestive heart failure) (HCC)   Hypertensive heart disease   Persistent atrial fibrillation (HCC)   Anemia of chronic disease    1. Acute respiratory distress with hypoxia: -Currently on [place] at 2 L -Wean as able  2. Acute on chronic combined CHF: -Lungs with crackles bilaterally midway up from the base -Agree with IV Lasix with KCl repletion -Has not been taking his prn torsemide regularly at home -May need to start taking PO torsemide 20 mg every other day as long as his renal function remains stable -Given his bradycardic rates we will decrease his Lopressor to 25 mg bid -May need to hold clonidine if bradycardia persists  -CHF education -Echo pending  3. Hypertensive heart disease: -BP has been up to 937 systolic this admission -Currently improved -With the decrease in Lopressor may need to adjust further antihypertensives   4. CKD stage III: -Stable at time of cardiology consult -Monitor closely with diuresis -Renal function is pending this morning s/p 80 mg of IV Lasix -Will wait to see what renal function trend is prior to possibly increasing dose frequency   5. Anemia of chronic disease: -Stable -Per IM  6. Persistent Afib: -Currently bradycardic -Continue Xarelto -CHADS2VASc 5 (CHF, HTN, age x 2,  DM)   Signed, Marcille Blanco [facility] Pager: [phone] 12/21/2015, 7:49 AM

## 2015-12-21 NOTE — Progress Notes (Signed)
[facility] [place] NAME: Patrick [name]    MR#:  [id number]  DATE OF BIRTH:  26-Feb-1932  SUBJECTIVE:   Patient here due to shortness of breath and noted to be in congestive heart failure. Improved with diuresis. Noted to have some 2 second pauses on telemetry and beta blocker dose has been adjusted by cardiology. Family at bedside. Clinically feels better.  REVIEW OF SYSTEMS:    Review of Systems  Constitutional: Negative for fever and weight loss.  HENT: Negative for congestion, nosebleeds and tinnitus.   Eyes: Negative for blurred vision, double vision and redness.  Respiratory: Positive for shortness of breath. Negative for cough and hemoptysis.   Cardiovascular: Negative for chest pain, orthopnea, leg swelling and PND.  Gastrointestinal: Negative for abdominal pain, diarrhea, melena, nausea and vomiting.  Genitourinary: Negative for dysuria, hematuria and urgency.  Musculoskeletal: Negative for falls and joint pain.  Neurological: Negative for dizziness, tingling, sensory change, focal weakness, seizures, weakness and headaches.  Endo/Heme/Allergies: Negative for polydipsia. Does not bruise/bleed easily.  Psychiatric/Behavioral: Negative for depression and memory loss. The patient is not nervous/anxious.     Nutrition: Heart Healthy/Carb control. Tolerating Diet: Yes Tolerating PT: Await Eval.    DRUG ALLERGIES:   Allergies  Allergen Reactions  . Levofloxacin Other (See Comments)    confusion  . Prednisone Other (See Comments)    confusion    VITALS:  Blood pressure 138/73, pulse (!) 56, temperature 98.6 F (37 C), temperature source Oral, resp. rate 12, height 5\' 8"  (1.727 m), weight 82.9 kg (182 lb 11.2 oz), SpO2 99 %.  PHYSICAL EXAMINATION:   Physical Exam  GENERAL:  82 y.o.-year-old patient lying in the bed in no acute distress.  EYES: Pupils equal, round, reactive to light and accommodation. No scleral  icterus. Extraocular muscles intact.  HEENT: Head atraumatic, normocephalic. Oropharynx and nasopharynx clear.  NECK:  Supple, no jugular venous distention. No thyroid enlargement, no tenderness.  LUNGS: Normal breath sounds bilaterally, no wheezing, rhonchi, bibasilar rales. No use of accessory muscles of respiration.  CARDIOVASCULAR: S1, S2 normal. No murmurs, rubs, or gallops.  ABDOMEN: Soft, nontender, nondistended. Bowel sounds present. No organomegaly or mass.  EXTREMITIES: No cyanosis, clubbing or edema b/l.    NEUROLOGIC: Cranial nerves II through XII are intact. No focal Motor or sensory deficits b/l.   PSYCHIATRIC: The patient is alert and oriented x 3.  SKIN: No obvious rash, lesion, or ulcer.    LABORATORY PANEL:   CBC  Recent Labs Lab 12/20/15 2017  WBC 9.0  HGB 11.3*  HCT 32.5*  PLT 331   ------------------------------------------------------------------------------------------------------------------  Chemistries   Recent Labs Lab 12/20/15 2017 12/21/15 0319  NA 141  --   K 3.2*  --   CL 104  --   CO2 29  --   GLUCOSE 157*  --   BUN 25*  --   CREATININE 1.86*  --   CALCIUM 9.1  --   MG  --  2.0  AST 26  --   ALT 15*  --   ALKPHOS 87  --   BILITOT 0.9  --    ------------------------------------------------------------------------------------------------------------------  Cardiac Enzymes  Recent Labs Lab 12/21/15 1429  TROPONINI 0.03*   ------------------------------------------------------------------------------------------------------------------  RADIOLOGY:  Dg Chest 2 View  Result Date: 12/21/2015 CLINICAL DATA:  Two days of increase shortness-of-breath. Stage III-IV Chronic kidney disease. EXAM: CHEST  2 VIEW COMPARISON:  12/20/2015 and 11/20/2015 FINDINGS: Lungs are adequately inflated without  focal consolidation. Mild interstitial prominence over the right base improved. Suggestion of a small amount of bilateral pleural fluid without  significant change. Mild stable cardiomegaly. Calcified atherosclerotic plaque over the aortic arch. There are degenerative changes of the spine. IMPRESSION: Subtle interstitial prominence over the right base improved. Stable small amount of bilateral pleural fluid. Mild stable cardiomegaly. Aortic atherosclerosis. Electronically Signed   By: Marin Olp M.D.   On: 12/21/2015 08:12   Dg Chest 2 View  Result Date: 12/20/2015 CLINICAL DATA:  Short of breath and weakness today. History of pneumonia and COPD. EXAM: CHEST  2 VIEW COMPARISON:  11/20/2015 FINDINGS: Cardiac silhouette is borderline enlarged. No mediastinal or hilar masses or convincing adenopathy. There is irregular thickening of the interstitial markings and prominence of the bronchovascular markings most evident in the bases, similar to the prior exam. There is no focal lung consolidation. No significant pleural effusion. No pneumothorax. Skeletal structures are demineralized but grossly intact. IMPRESSION: Interstitial thickening with borderline cardiomegaly. Although this may be chronic, and mild degree of interstitial edema should be considered given the history. No evidence of pneumonia. Underlying COPD. Electronically Signed   By: Lajean Manes M.D.   On: 12/20/2015 20:20     ASSESSMENT AND PLAN:   82 year old male with past medical history of congestive heart failure, hypertension, diabetes, on a kidney disease stage III, anemia of chronic disease, chronic atrial fibrillation who presented to the hospital due to shortness of breath and noted to be in congestive heart failure.  1. Acute on chronic CHF-this is acute on chronic combined diastolic/systolic CHF. -Continue diuresis with IV Lasix, apparently patient has not been taking his torsemide regularly at home. -Patient noted to have long pauses on telemetry with bradycardia therefore his Lopressor dose has been decreased. -Appreciate cardiology input and continue current  care. -Echocardiogram pending.  2. Essential hypertension-continue clonidine, enalapril, low-dose metoprolol given the pauses and bradycardia.  3. History of chronic atrial fibrillation-currently rate controlled. Continue Lopressor, continue Xarelto.  4. Diabetes type 2 without compilation-continue sliding scale insulin. Blood sugar stable. Next  5. History of chronic kidney disease stage III-creatinine close to baseline. Follow with diuresis.  6. Hyperlipidemia-continue Pravachol.      All the records are reviewed and case discussed with Care Management/Social Worker. Management plans discussed with the patient, family and they are in agreement.  CODE STATUS: Full  DVT Prophylaxis: Xarelto  TOTAL TIME TAKING CARE OF THIS PATIENT: 30 minutes.   POSSIBLE D/C IN 1-2 DAYS, DEPENDING ON CLINICAL CONDITION.   Henreitta Leber M.D on 12/21/2015 at 3:28 PM  Between 7am to 6pm - Pager - [phone]  After 6pm go to [email or web address] - Proofreader  [facility]  Office  [phone]  CC: Primary care physician; Maryland Pink, MD

## 2015-12-22 DIAGNOSIS — I509 Heart failure, unspecified: Secondary | ICD-10-CM

## 2015-12-22 LAB — ECHOCARDIOGRAM COMPLETE
AO mean calculated velocity dopler: 136 cm/s
AV Area VTI index: 1.05 cm2/m2
AV Area VTI: 2.2 cm2
AV Area mean vel: 2.3 cm2
AV Mean grad: 8 mmHg
AV Peak grad: 13 mmHg
AV VEL mean LVOT/AV: 0.55
AV area mean vel ind: 1.14 cm2/m2
AV peak Index: 1.09
AV pk vel: 179 cm/s
AV vel: 2.11
Ao pk vel: 0.53 m/s
E decel time: 215 ms
FS: 29 % (ref 28–44)
Height: 68 in
IVS/LV PW RATIO, ED: 1.65
LA ID, A-P, ES: 49 mm
LA diam end sys: 49 mm
LA diam index: 2.44 cm/m2
LA vol A4C: 79.8 mL
LA vol index: 41 mL/m2
LA vol: 82.5 mL
LV PW d: 10.7 mm — AB (ref 0.6–1.1)
LVOT SV: 90 mL
LVOT VTI: 21.7 cm
LVOT area: 4.15 cm2
LVOT diameter: 23 mm
LVOT peak VTI: 0.51 cm
LVOT peak vel: 95.1 cm/s
Lateral S' vel: 9.41 cm/s
MV Dec: 215
MV Peak grad: 6 mmHg
MV pk A vel: 54.3 m/s
MV pk E vel: 118 m/s
VTI: 42.6 cm
Valve area index: 1.05
Valve area: 2.11 cm2
Weight: 2923.2 [oz_av]

## 2015-12-22 LAB — BASIC METABOLIC PANEL WITH GFR
Anion gap: 4 — ABNORMAL LOW (ref 5–15)
BUN: 32 mg/dL — ABNORMAL HIGH (ref 6–20)
CO2: 29 mmol/L (ref 22–32)
Calcium: 8.5 mg/dL — ABNORMAL LOW (ref 8.9–10.3)
Chloride: 105 mmol/L (ref 101–111)
Creatinine, Ser: 1.95 mg/dL — ABNORMAL HIGH (ref 0.61–1.24)
GFR calc Af Amer: 35 mL/min — ABNORMAL LOW
GFR calc non Af Amer: 30 mL/min — ABNORMAL LOW
Glucose, Bld: 133 mg/dL — ABNORMAL HIGH (ref 65–99)
Potassium: 3.8 mmol/L (ref 3.5–5.1)
Sodium: 138 mmol/L (ref 135–145)

## 2015-12-22 LAB — GLUCOSE, CAPILLARY
Glucose-Capillary: 151 mg/dL — ABNORMAL HIGH (ref 65–99)
Glucose-Capillary: 173 mg/dL — ABNORMAL HIGH (ref 65–99)

## 2015-12-22 MED ORDER — METOPROLOL TARTRATE 25 MG PO TABS
25.0000 mg | ORAL_TABLET | Freq: Two times a day (BID) | ORAL | 1 refills | Status: DC
Start: 2015-12-22 — End: 2016-01-18

## 2015-12-22 MED ORDER — TORSEMIDE 20 MG PO TABS: 20.0000 mg | ORAL_TABLET | ORAL | 1 refills | Status: DC

## 2015-12-22 MED ORDER — POTASSIUM CHLORIDE ER 20 MEQ PO TBCR: 20.0000 meq | EXTENDED_RELEASE_TABLET | ORAL | 1 refills | Status: DC

## 2015-12-22 NOTE — Discharge Summary (Signed)
[facility] [place] NAME: Patrick [name]    MR#:  [id number]  DATE OF BIRTH:  12-31-1931  DATE OF ADMISSION:  12/20/2015 ADMITTING PHYSICIAN: Harvie Bridge, DO  DATE OF DISCHARGE: 12/22/2015  2:35 PM  PRIMARY CARE PHYSICIAN: [place] Pink, MD    ADMISSION DIAGNOSIS:  CHF (congestive heart failure) (HCC) [I50.9] Congestive heart failure, unspecified congestive heart failure chronicity, unspecified congestive heart failure type ([place]) [I50.9] Acute hypoxemic respiratory failure (HCC) [J96.01]  DISCHARGE DIAGNOSIS:  Principal Problem:   Acute respiratory distress (HCC) Active Problems:   Persistent atrial fibrillation (HCC)   Anemia of chronic disease   CKD (chronic kidney disease), stage III   Acute on chronic combined systolic and diastolic CHF (congestive heart failure) (HCC)   Hypertensive heart disease   Acute hypoxemic respiratory failure (HCC)   Congestive heart failure ([place])   SECONDARY DIAGNOSIS:   Past Medical History:  Diagnosis Date  . Amputated finger   . Anemia of chronic disease    a. plan of oncology to start Procrit - received during admission 12/2013  . Atypical chest pain    a. 12/2014 Neg CE - in setting of L pleural effusion.  . Bell's palsy   . Chronic combined systolic and diastolic CHF (congestive heart failure) ([place])    a. 11/2012 Echo: EF 60-65%, mod conc LVH, mildly dil LA/RA, mild Ao sclerosis w/o stenosis; b. 11/2014 Echo: EF 40-45%, prob mid-apicalanteroseptal, ant, apical HK, Gr2 DD, mod dil LA, mildly dil RA (technically difficult study).  . CKD (chronic kidney disease), stage III    a. stage III-IV  . Diabetes mellitus without complication ([place])   . GERD (gastroesophageal reflux disease)   . History of gastroesophageal reflux (GERD)   . Hyperlipidemia   . Hypertension   . Permanent atrial fibrillation ([place])    a. Dx 12/2011, Rate-controlled, chronic Xarelto (renal dosing) - CHA2DS2VASc =  5.  . Pleural effusion, left    a. 12/2014 s/p thoracentesis - protein <3, LDH 123, no malignancy.    HOSPITAL COURSE:   82 year old male with past medical history of congestive heart failure, hypertension, diabetes, on a kidney disease stage III, anemia of chronic disease, chronic atrial fibrillation who presented to the hospital due to shortness of breath and noted to be in congestive heart failure.  1. Acute on chronic CHF-this was acute on chronic combined diastolic/systolic CHF. -Patient was diuresed with IV Lasix and responded well to it. He apparently was taking torsemide but noncompliant with it. -After diuresis patient has clinically improved. He is not being discharged on oral torsemide every other day with follow-up with cardiology as an outpatient. -Patient was seen by cardiology who agreed with this management. Patient did have echocardiogram done which showed normal ejection fraction. -Patient's shortness of breath is significantly improved and therefore he is now being discharged home.  2. Essential hypertension- he will continue clonidine, enalapril, patient's Lopressor dose was lowered as patient had some bradycardia and sinus pauses on telemetry.  3. History of chronic atrial fibrillation-currently rate controlled. Pt. Will Continue Lopressor at a lower dose and continue Xarelto.  4. Diabetes type 2 without compilation- pt. Will resume his Novolog 75/25 upon discharge.  - BS Stable while in the hospital.  No hypoglycemia.   5. History of chronic kidney disease stage III-creatinine close to baseline and remained stable with diuresis. Cr. Can further be followed as outpatient.   6. Hyperlipidemia- pt. Will continue Pravachol.  DISCHARGE CONDITIONS:  Stable.   CONSULTS OBTAINED:  Treatment Team:  Minna Merritts, MD  DRUG ALLERGIES:   Allergies  Allergen Reactions  . Levofloxacin Other (See Comments)    confusion  . Prednisone Other (See Comments)     confusion    DISCHARGE MEDICATIONS:     Medication List    TAKE these medications   aspirin 81 MG tablet Take 81 mg by mouth daily.   cloNIDine 0.1 MG tablet Commonly known as:  CATAPRES Take 1 tablet (0.1 mg total) by mouth 2 (two) times daily. Notes to patient:  Next dose tonight   enalapril 5 MG tablet Commonly known as:  VASOTEC Take 1 tablet (5 mg total) by mouth daily.   HUMALOG MIX 75/25 [place] Inject 70 Units into the skin 2 (two) times daily. Don't take if under 150   metoprolol tartrate 25 MG tablet Commonly known as:  LOPRESSOR Take 1 tablet (25 mg total) by mouth 2 (two) times daily. What changed:  medication strength  how much to take   omeprazole 40 MG capsule Commonly known as:  PRILOSEC Take 1 capsule (40 mg total) by mouth daily.   Potassium Chloride ER 20 MEQ Tbcr Take 20 mEq by mouth every other day. Notes to patient:  Resume your next dose Oct 1   pravastatin 40 MG tablet Commonly known as:  PRAVACHOL Take 2 tablets (80 mg total) by mouth daily. Take two tablets daily.   Rivaroxaban 15 MG Tabs tablet Commonly known as:  XARELTO Take 1 tablet (15 mg total) by mouth daily.   torsemide 20 MG tablet Commonly known as:  DEMADEX Take 1 tablet (20 mg total) by mouth every other day. What changed:  how much to take  how to take this  when to take this  additional instructions         DISCHARGE INSTRUCTIONS:   DIET:  Cardiac diet and Diabetic diet  DISCHARGE CONDITION:  Stable  ACTIVITY:  Activity as tolerated  OXYGEN:  Home Oxygen: Yes.     Oxygen Delivery: 2 liters/min via Patient connected to nasal cannula oxygen  DISCHARGE LOCATION:  Home with Home health PT, RN.    If you experience worsening of your admission symptoms, develop shortness of breath, life threatening emergency, suicidal or homicidal thoughts you must seek medical attention immediately by calling 911 or calling your MD immediately  if symptoms less  severe.  You Must read complete instructions/literature along with all the possible adverse reactions/side effects for all the Medicines you take and that have been prescribed to you. Take any new Medicines after you have completely understood and accpet all the possible adverse reactions/side effects.   Please note  You were cared for by a hospitalist during your hospital stay. If you have any questions about your discharge medications or the care you received while you were in the hospital after you are discharged, you can call the unit and asked to speak with the hospitalist on call if the hospitalist that took care of you is not available. Once you are discharged, your primary care physician will handle any further medical issues. Please note that NO REFILLS for any discharge medications will be authorized once you are discharged, as it is imperative that you return to your primary care physician (or establish a relationship with a primary care physician if you do not have one) for your aftercare needs so that they can reassess your need for medications and monitor your lab values.  Today   Shortness of breath much improved.  Wife at bedside.  No other complaints.   VITAL SIGNS:  Blood pressure (!) 147/75, pulse (!) 39, temperature 97.8 F (36.6 C), temperature source Oral, resp. rate 18, height 5\' 8"  (1.727 m), weight 83.5 kg (184 lb), SpO2 99 %.  I/O:   Intake/Output Summary (Last 24 hours) at 12/22/15 1523 Last data filed at 12/22/15 1354  Gross per 24 hour  Intake              600 ml  Output             1075 ml  Net             -475 ml    PHYSICAL EXAMINATION:  GENERAL:  82 y.o.-year-old patient lying in the bed with no acute distress.  EYES: Pupils equal, round, reactive to light and accommodation. No scleral icterus. Extraocular muscles intact.  HEENT: Head atraumatic, normocephalic. Oropharynx and nasopharynx clear.  NECK:  Supple, no jugular venous distention. No  thyroid enlargement, no tenderness.  LUNGS: Normal breath sounds bilaterally, no wheezing, rales,rhonchi. No use of accessory muscles of respiration.  CARDIOVASCULAR: S1, S2 normal. No murmurs, rubs, or gallops.  ABDOMEN: Soft, non-tender, non-distended. Bowel sounds present. No organomegaly or mass.  EXTREMITIES: No pedal edema, cyanosis, or clubbing.  NEUROLOGIC: Cranial nerves II through XII are intact. No focal motor or sensory defecits b/l.  PSYCHIATRIC: The patient is alert and oriented x 3. Good affect.  SKIN: No obvious rash, lesion, or ulcer.   DATA REVIEW:   CBC  Recent Labs Lab 12/20/15 2017  WBC 9.0  HGB 11.3*  HCT 32.5*  PLT 331    Chemistries   Recent Labs Lab 12/20/15 2017 12/21/15 0319 12/22/15 0414  NA 141  --  138  K 3.2*  --  3.8  CL 104  --  105  CO2 29  --  29  GLUCOSE 157*  --  133*  BUN 25*  --  32*  CREATININE 1.86*  --  1.95*  CALCIUM 9.1  --  8.5*  MG  --  2.0  --   AST 26  --   --   ALT 15*  --   --   ALKPHOS 87  --   --   BILITOT 0.9  --   --     Cardiac Enzymes  Recent Labs Lab 12/21/15 1429  TROPONINI 0.03*    Microbiology Results  Results for orders placed or performed during the hospital encounter of 12/20/15  Blood Culture (routine x 2)     Status: None (Preliminary result)   Collection Time: 12/20/15  8:17 PM  Result Value Ref Range Status   Specimen Description BLOOD RIGHT ASSIST CONTROL  Final   Special Requests BOTTLES DRAWN AEROBIC AND ANAEROBIC [place]  Final   Culture NO GROWTH 2 DAYS  Final   Report Status PENDING  Incomplete  Blood Culture (routine x 2)     Status: None (Preliminary result)   Collection Time: 12/20/15  8:18 PM  Result Value Ref Range Status   Specimen Description BLOOD RIGHT ASSIST CONTROL  Final   Special Requests   Final    BOTTLES DRAWN AEROBIC AND ANAEROBIC [place] AERO 8CC ANA   Culture NO GROWTH 2 DAYS  Final   Report Status PENDING  Incomplete    RADIOLOGY:  Dg Chest 2 View  Result  Date: 12/21/2015 CLINICAL DATA:  Two days of increase shortness-of-breath. Stage III-IV  Chronic kidney disease. EXAM: CHEST  2 VIEW COMPARISON:  12/20/2015 and 11/20/2015 FINDINGS: Lungs are adequately inflated without focal consolidation. Mild interstitial prominence over the right base improved. Suggestion of a small amount of bilateral pleural fluid without significant change. Mild stable cardiomegaly. Calcified atherosclerotic plaque over the aortic arch. There are degenerative changes of the spine. IMPRESSION: Subtle interstitial prominence over the right base improved. Stable small amount of bilateral pleural fluid. Mild stable cardiomegaly. Aortic atherosclerosis. Electronically Signed   By: Marin Olp M.D.   On: 12/21/2015 08:12   Dg Chest 2 View  Result Date: 12/20/2015 CLINICAL DATA:  Short of breath and weakness today. History of pneumonia and COPD. EXAM: CHEST  2 VIEW COMPARISON:  11/20/2015 FINDINGS: Cardiac silhouette is borderline enlarged. No mediastinal or hilar masses or convincing adenopathy. There is irregular thickening of the interstitial markings and prominence of the bronchovascular markings most evident in the bases, similar to the prior exam. There is no focal lung consolidation. No significant pleural effusion. No pneumothorax. Skeletal structures are demineralized but grossly intact. IMPRESSION: Interstitial thickening with borderline cardiomegaly. Although this may be chronic, and mild degree of interstitial edema should be considered given the history. No evidence of pneumonia. Underlying COPD. Electronically Signed   By: Lajean Manes M.D.   On: 12/20/2015 20:20      Management plans discussed with the patient, family and they are in agreement.  CODE STATUS:     Code Status Orders        Start     Ordered   12/21/15 0226  Full code  Continuous     12/21/15 0225    Code Status History    Date Active Date Inactive Code Status Order ID Comments User Context    12/21/2015  2:25 AM 12/21/2015  5:33 PM Full Code [id number]  Harvie Bridge, DO ED   11/20/2015 10:33 PM 11/22/2015  8:09 PM Full Code [id number]  Lance Coon, MD Inpatient   12/23/2014 11:44 AM 12/26/2014  7:40 PM Partial Code [id number]  Hillary Bow, MD Inpatient   12/23/2014  1:27 AM 12/23/2014 11:44 AM Full Code [id number]  Juluis Mire, MD Inpatient    Advance Directive Documentation   Flowsheet Row Most Recent Value  Type of Advance Directive  Healthcare Power of Attorney  Pre-existing out of facility DNR order (yellow form or pink MOST form)  No data  "MOST" Form in Place?  No data      TOTAL TIME TAKING CARE OF THIS PATIENT: 40 minutes.    Henreitta Leber M.D on 12/22/2015 at 3:23 PM  Between 7am to 6pm - Pager - [phone]  After 6pm go to [email or web address] - Proofreader  [facility]  Office  [phone]  CC: Primary care physician; [place] Pink, MD

## 2015-12-22 NOTE — Care Management (Signed)
Patient is discharge home today with home health SN and PT.  New home 02.  Agency preference is [facility] .  Referral called.  Orders present

## 2015-12-22 NOTE — Progress Notes (Signed)
[place] was admitted to the Hospital on 12/20/2015 and Discharged  12/22/2015 and should be excused from work/school   for 5 days starting 12/20/2015 , may return to work/school without any restrictions.  Call Abel Presto MD, [facility]  [phone] with questions.  Henreitta Leber M.D on 12/22/2015,at 10:22 AM

## 2015-12-22 NOTE — Progress Notes (Signed)
SATURATION QUALIFICATIONS:taken 9.28  4:40p  Patient Saturations on Room Air at Rest = 94%  Patient Saturations on Room Air while Ambulating 87%  Patient Saturations on 3 Liters of oxygen while Ambulating = 94%

## 2015-12-22 NOTE — Care Management Important Message (Signed)
Important Message  Patient Details  Name: Patrick [name] MRN: [id number] Date of Birth: 1932-01-11   Medicare Important Message Given:  Yes    Katrina Stack, RN 12/22/2015, 11:21 AM

## 2015-12-22 NOTE — Progress Notes (Signed)
Patient: Patrick [name] / Admit Date: 12/20/2015 / Date of Encounter: 12/22/2015, 1:41 PM   Subjective: Reports he feels somewhat better Ready to go home, denies any shortness of breath Receiving Lasix this morning Has not ambulated very far  Review of Systems: Review of Systems  Constitutional: Negative.   Respiratory: Negative.   Cardiovascular: Negative.   Gastrointestinal: Negative.   Musculoskeletal: Negative.   Neurological: Negative.   Psychiatric/Behavioral: Negative.   All other systems reviewed and are negative.  All other systems reviewed and negative.   Objective: Telemetry:  Physical Exam: Blood pressure (!) 147/75, pulse (!) 39, temperature 97.8 F (36.6 C), temperature source Oral, resp. rate 18, height 5\' 8"  (1.727 m), weight 184 lb (83.5 kg), SpO2 99 %. Body mass index is 27.98 kg/m. General: Well developed, well nourished, in no acute distress. Head: Normocephalic, atraumatic, sclera non-icteric, no xanthomas, nares are without discharge. Neck: Negative for carotid bruits. JVP not elevated. Lungs: Clear bilaterally to auscultation without wheezes, rales, or rhonchi. Breathing is unlabored. Heart: RRR S1 S2 without murmurs, rubs, or gallops.  Abdomen: Soft, non-tender, non-distended with normoactive bowel sounds. No rebound/guarding. Extremities: No clubbing or cyanosis. No edema. Distal pedal pulses are 2+ and equal bilaterally. Neuro: Alert and oriented X 3. Moves all extremities spontaneously. Psych:  Responds to questions appropriately with a normal affect.   Intake/Output Summary (Last 24 hours) at 12/22/15 1341 Last data filed at 12/22/15 1315  Gross per 24 hour  Intake              840 ml  Output             1250 ml  Net             -410 ml    Inpatient Medications:  . aspirin EC  81 mg Oral Daily  . cloNIDine  0.1 mg Oral BID  . enalapril  5 mg Oral Daily  . furosemide  20 mg Intravenous Daily  . insulin aspart  0-5 Units  Subcutaneous QHS  . insulin aspart  0-9 Units Subcutaneous TID WC  . metoprolol  25 mg Oral BID  . pantoprazole  40 mg Oral QAC breakfast  . pravastatin  80 mg Oral Daily  . Rivaroxaban  15 mg Oral Q supper  . sodium chloride flush  3 mL Intravenous Q12H   Infusions:    Labs:  Recent Labs  12/20/15 2017 12/21/15 0319 12/22/15 0414  NA 141  --  138  K 3.2*  --  3.8  CL 104  --  105  CO2 29  --  29  GLUCOSE 157*  --  133*  BUN 25*  --  32*  CREATININE 1.86*  --  1.95*  CALCIUM 9.1  --  8.5*  MG  --  2.0  --     Recent Labs  12/20/15 2017  AST 26  ALT 15*  ALKPHOS 87  BILITOT 0.9  PROT 7.3  ALBUMIN 3.3*    Recent Labs  12/20/15 2017  WBC 9.0  NEUTROABS 7.3*  HGB 11.3*  HCT 32.5*  MCV 89.1  PLT 331    Recent Labs  12/20/15 2017 12/21/15 0319 12/21/15 0910 12/21/15 1429  TROPONINI 0.03* 0.03* 0.03* 0.03*   Invalid input(s): POCBNP No results for input(s): HGBA1C in the last 72 hours.   Weights: Filed Weights   12/21/15 0225 12/21/15 0520 12/22/15 0436  Weight: 182 lb 3.2 oz (82.6 kg) 182 lb 11.2 oz (82.9  kg) 184 lb (83.5 kg)     Radiology/Studies:  Dg Chest 2 View  Result Date: 12/21/2015 CLINICAL DATA:  Two days of increase shortness-of-breath. Stage III-IV Chronic kidney disease. EXAM: CHEST  2 VIEW COMPARISON:  12/20/2015 and 11/20/2015 FINDINGS: Lungs are adequately inflated without focal consolidation. Mild interstitial prominence over the right base improved. Suggestion of a small amount of bilateral pleural fluid without significant change. Mild stable cardiomegaly. Calcified atherosclerotic plaque over the aortic arch. There are degenerative changes of the spine. IMPRESSION: Subtle interstitial prominence over the right base improved. Stable small amount of bilateral pleural fluid. Mild stable cardiomegaly. Aortic atherosclerosis. Electronically Signed   By: Marin Olp M.D.   On: 12/21/2015 08:12   Dg Chest 2 View  Result Date:  12/20/2015 CLINICAL DATA:  Short of breath and weakness today. History of pneumonia and COPD. EXAM: CHEST  2 VIEW COMPARISON:  11/20/2015 FINDINGS: Cardiac silhouette is borderline enlarged. No mediastinal or hilar masses or convincing adenopathy. There is irregular thickening of the interstitial markings and prominence of the bronchovascular markings most evident in the bases, similar to the prior exam. There is no focal lung consolidation. No significant pleural effusion. No pneumothorax. Skeletal structures are demineralized but grossly intact. IMPRESSION: Interstitial thickening with borderline cardiomegaly. Although this may be chronic, and mild degree of interstitial edema should be considered given the history. No evidence of pneumonia. Underlying COPD. Electronically Signed   By: Lajean Manes M.D.   On: 12/20/2015 20:20     Assessment and Plan  82 y.o. male    --- Acute on chronic diastolic and systolic CHF Improved symptoms with diuresis,  echocardiogram reviewed this morning showing normal LV function, moderately elevated right heart pressures Discussed results with patient and his wife Recommended additional IV Lasix today, possible discharge later today Torsemide for the next 2 days then torsemide every other day He will need potassium when he takes torsemide Close outpatient follow-up  On beta blocker, ACE inhibitor  --- Hypertension Likely exacerbated by fluid retention He may benefit from higher dose clonidine 0.2 mg twice a day  --- Bradycardia Seems to have bradycardia when sleeping which is less of a concern Likely has underlying sleep apnea  metoprolol 25 mill grams twice a day  ----Atrial fibrillation, chronic Likely contributing to heart failure symptoms Rate well controlled, tolerating anticoagulation  dispo He will receive IV Lasix this morning Potentially could go home later this afternoon Close outpatient follow-up Would likely benefit from CHF clinic  for continued education long discussion with hospitalist and patient, CHF education provided Echocardiogram results reviewed with him in detail Discussed plan with daughter on the phone   Total encounter time more than 35 minutes  Greater than 50% was spent in counseling and coordination of care with the patient    Signed, Esmond Plants, MD, Ph.D. [facility] 12/22/2015, 1:41 PM

## 2015-12-23 DIAGNOSIS — D638 Anemia in other chronic diseases classified elsewhere: Secondary | ICD-10-CM | POA: Diagnosis not present

## 2015-12-23 DIAGNOSIS — E1122 Type 2 diabetes mellitus with diabetic chronic kidney disease: Secondary | ICD-10-CM | POA: Diagnosis not present

## 2015-12-23 DIAGNOSIS — I481 Persistent atrial fibrillation: Secondary | ICD-10-CM | POA: Diagnosis not present

## 2015-12-23 DIAGNOSIS — Z9981 Dependence on supplemental oxygen: Secondary | ICD-10-CM | POA: Diagnosis not present

## 2015-12-23 DIAGNOSIS — E785 Hyperlipidemia, unspecified: Secondary | ICD-10-CM | POA: Diagnosis not present

## 2015-12-23 DIAGNOSIS — N183 Chronic kidney disease, stage 3 (moderate): Secondary | ICD-10-CM | POA: Diagnosis not present

## 2015-12-23 DIAGNOSIS — J9601 Acute respiratory failure with hypoxia: Secondary | ICD-10-CM | POA: Diagnosis not present

## 2015-12-23 DIAGNOSIS — I130 Hypertensive heart and chronic kidney disease with heart failure and stage 1 through stage 4 chronic kidney disease, or unspecified chronic kidney disease: Secondary | ICD-10-CM | POA: Diagnosis not present

## 2015-12-23 DIAGNOSIS — I5043 Acute on chronic combined systolic (congestive) and diastolic (congestive) heart failure: Secondary | ICD-10-CM | POA: Diagnosis not present

## 2015-12-25 DIAGNOSIS — N183 Chronic kidney disease, stage 3 (moderate): Secondary | ICD-10-CM | POA: Diagnosis not present

## 2015-12-25 DIAGNOSIS — J9601 Acute respiratory failure with hypoxia: Secondary | ICD-10-CM | POA: Diagnosis not present

## 2015-12-25 DIAGNOSIS — I481 Persistent atrial fibrillation: Secondary | ICD-10-CM | POA: Diagnosis not present

## 2015-12-25 DIAGNOSIS — I5042 Chronic combined systolic (congestive) and diastolic (congestive) heart failure: Secondary | ICD-10-CM | POA: Diagnosis not present

## 2015-12-25 LAB — CULTURE, BLOOD (ROUTINE X 2)
CULTURE: NO GROWTH
Culture: NO GROWTH

## 2015-12-26 DIAGNOSIS — Z9981 Dependence on supplemental oxygen: Secondary | ICD-10-CM | POA: Diagnosis not present

## 2015-12-26 DIAGNOSIS — J9601 Acute respiratory failure with hypoxia: Secondary | ICD-10-CM | POA: Diagnosis not present

## 2015-12-26 DIAGNOSIS — D638 Anemia in other chronic diseases classified elsewhere: Secondary | ICD-10-CM | POA: Diagnosis not present

## 2015-12-26 DIAGNOSIS — E1122 Type 2 diabetes mellitus with diabetic chronic kidney disease: Secondary | ICD-10-CM | POA: Diagnosis not present

## 2015-12-26 DIAGNOSIS — E785 Hyperlipidemia, unspecified: Secondary | ICD-10-CM | POA: Diagnosis not present

## 2015-12-26 DIAGNOSIS — I481 Persistent atrial fibrillation: Secondary | ICD-10-CM | POA: Diagnosis not present

## 2015-12-26 DIAGNOSIS — N183 Chronic kidney disease, stage 3 (moderate): Secondary | ICD-10-CM | POA: Diagnosis not present

## 2015-12-26 DIAGNOSIS — I130 Hypertensive heart and chronic kidney disease with heart failure and stage 1 through stage 4 chronic kidney disease, or unspecified chronic kidney disease: Secondary | ICD-10-CM | POA: Diagnosis not present

## 2015-12-26 DIAGNOSIS — I5043 Acute on chronic combined systolic (congestive) and diastolic (congestive) heart failure: Secondary | ICD-10-CM | POA: Diagnosis not present

## 2015-12-27 ENCOUNTER — Ambulatory Visit: Payer: Self-pay | Admitting: *Deleted

## 2015-12-27 DIAGNOSIS — I481 Persistent atrial fibrillation: Secondary | ICD-10-CM | POA: Diagnosis not present

## 2015-12-27 DIAGNOSIS — E785 Hyperlipidemia, unspecified: Secondary | ICD-10-CM | POA: Diagnosis not present

## 2015-12-27 DIAGNOSIS — E1122 Type 2 diabetes mellitus with diabetic chronic kidney disease: Secondary | ICD-10-CM | POA: Diagnosis not present

## 2015-12-27 DIAGNOSIS — I5043 Acute on chronic combined systolic (congestive) and diastolic (congestive) heart failure: Secondary | ICD-10-CM | POA: Diagnosis not present

## 2015-12-27 DIAGNOSIS — I130 Hypertensive heart and chronic kidney disease with heart failure and stage 1 through stage 4 chronic kidney disease, or unspecified chronic kidney disease: Secondary | ICD-10-CM | POA: Diagnosis not present

## 2015-12-27 DIAGNOSIS — J9601 Acute respiratory failure with hypoxia: Secondary | ICD-10-CM | POA: Diagnosis not present

## 2015-12-27 DIAGNOSIS — D638 Anemia in other chronic diseases classified elsewhere: Secondary | ICD-10-CM | POA: Diagnosis not present

## 2015-12-27 DIAGNOSIS — Z9981 Dependence on supplemental oxygen: Secondary | ICD-10-CM | POA: Diagnosis not present

## 2015-12-27 DIAGNOSIS — N183 Chronic kidney disease, stage 3 (moderate): Secondary | ICD-10-CM | POA: Diagnosis not present

## 2015-12-28 DIAGNOSIS — N183 Chronic kidney disease, stage 3 (moderate): Secondary | ICD-10-CM | POA: Diagnosis not present

## 2015-12-28 DIAGNOSIS — I5043 Acute on chronic combined systolic (congestive) and diastolic (congestive) heart failure: Secondary | ICD-10-CM | POA: Diagnosis not present

## 2015-12-28 DIAGNOSIS — E1122 Type 2 diabetes mellitus with diabetic chronic kidney disease: Secondary | ICD-10-CM | POA: Diagnosis not present

## 2015-12-28 DIAGNOSIS — D638 Anemia in other chronic diseases classified elsewhere: Secondary | ICD-10-CM | POA: Diagnosis not present

## 2015-12-28 DIAGNOSIS — J9601 Acute respiratory failure with hypoxia: Secondary | ICD-10-CM | POA: Diagnosis not present

## 2015-12-28 DIAGNOSIS — I481 Persistent atrial fibrillation: Secondary | ICD-10-CM | POA: Diagnosis not present

## 2015-12-28 DIAGNOSIS — I130 Hypertensive heart and chronic kidney disease with heart failure and stage 1 through stage 4 chronic kidney disease, or unspecified chronic kidney disease: Secondary | ICD-10-CM | POA: Diagnosis not present

## 2015-12-28 DIAGNOSIS — E785 Hyperlipidemia, unspecified: Secondary | ICD-10-CM | POA: Diagnosis not present

## 2015-12-28 DIAGNOSIS — Z9981 Dependence on supplemental oxygen: Secondary | ICD-10-CM | POA: Diagnosis not present

## 2016-01-02 ENCOUNTER — Telehealth: Payer: Self-pay | Admitting: Cardiovascular Disease

## 2016-01-02 DIAGNOSIS — I130 Hypertensive heart and chronic kidney disease with heart failure and stage 1 through stage 4 chronic kidney disease, or unspecified chronic kidney disease: Secondary | ICD-10-CM | POA: Diagnosis not present

## 2016-01-02 DIAGNOSIS — Z9981 Dependence on supplemental oxygen: Secondary | ICD-10-CM | POA: Diagnosis not present

## 2016-01-02 DIAGNOSIS — D638 Anemia in other chronic diseases classified elsewhere: Secondary | ICD-10-CM | POA: Diagnosis not present

## 2016-01-02 DIAGNOSIS — N183 Chronic kidney disease, stage 3 (moderate): Secondary | ICD-10-CM | POA: Diagnosis not present

## 2016-01-02 DIAGNOSIS — E785 Hyperlipidemia, unspecified: Secondary | ICD-10-CM | POA: Diagnosis not present

## 2016-01-02 DIAGNOSIS — J9601 Acute respiratory failure with hypoxia: Secondary | ICD-10-CM | POA: Diagnosis not present

## 2016-01-02 DIAGNOSIS — I5043 Acute on chronic combined systolic (congestive) and diastolic (congestive) heart failure: Secondary | ICD-10-CM | POA: Diagnosis not present

## 2016-01-02 DIAGNOSIS — I481 Persistent atrial fibrillation: Secondary | ICD-10-CM | POA: Diagnosis not present

## 2016-01-02 DIAGNOSIS — E1122 Type 2 diabetes mellitus with diabetic chronic kidney disease: Secondary | ICD-10-CM | POA: Diagnosis not present

## 2016-01-02 NOTE — Telephone Encounter (Signed)
Nurse with [facility] states pt has gained 6 pounds over 1 weeks time, and 1 cm increase in abdominal measurement, 1 cm increase in both ankle measurements. She states pt has already taken fluid pill for today. Please call.

## 2016-01-02 NOTE — Telephone Encounter (Signed)
Left message for pt to call back  °

## 2016-01-03 NOTE — Telephone Encounter (Signed)
Mendel Ryder RN with [facility] called to make [place] aware that patient had a 6 pound weight gain and a 1 cm increase in size for his ankles, and abdomen and that she wanted to know if we should change anything. Let her know that I had spoke with patients wife this morning and she reports that his ankles are back to normal and that he lost 1 pound overnight. She was glad to hear this and states that she was just wanting to make [place] aware. Let her know that the patients wife was instructed to give him extra fluid pill if needed for swelling and that he was doing better today with no further questions or needs at this time. Let her know to please give [place] a call back if she has any further concerns. She verbalized understanding and was appreciative for the call with no further questions at this time.

## 2016-01-03 NOTE — Telephone Encounter (Signed)
Patients wife states that her husband had some weight gain and swelling and the home health nurse was calling to report this. She reports that he had him take an extra fluid pill as directed and that this morning he has lost a pound and his ankles are now back to normal. Instructed her to please give [place] a call if they have any further questions or problems. Let her know that I would call and talk with the nurse as well and she requested that I let her know he had lost a pound and the swelling was gone. She verbalized understanding and had no further questions at this time.

## 2016-01-04 ENCOUNTER — Ambulatory Visit: Payer: Commercial Managed Care - HMO | Admitting: Family

## 2016-01-04 DIAGNOSIS — J9601 Acute respiratory failure with hypoxia: Secondary | ICD-10-CM | POA: Diagnosis not present

## 2016-01-04 DIAGNOSIS — I5043 Acute on chronic combined systolic (congestive) and diastolic (congestive) heart failure: Secondary | ICD-10-CM | POA: Diagnosis not present

## 2016-01-04 DIAGNOSIS — D638 Anemia in other chronic diseases classified elsewhere: Secondary | ICD-10-CM | POA: Diagnosis not present

## 2016-01-04 DIAGNOSIS — Z9981 Dependence on supplemental oxygen: Secondary | ICD-10-CM | POA: Diagnosis not present

## 2016-01-04 DIAGNOSIS — I481 Persistent atrial fibrillation: Secondary | ICD-10-CM | POA: Diagnosis not present

## 2016-01-04 DIAGNOSIS — E785 Hyperlipidemia, unspecified: Secondary | ICD-10-CM | POA: Diagnosis not present

## 2016-01-04 DIAGNOSIS — N183 Chronic kidney disease, stage 3 (moderate): Secondary | ICD-10-CM | POA: Diagnosis not present

## 2016-01-04 DIAGNOSIS — I130 Hypertensive heart and chronic kidney disease with heart failure and stage 1 through stage 4 chronic kidney disease, or unspecified chronic kidney disease: Secondary | ICD-10-CM | POA: Diagnosis not present

## 2016-01-04 DIAGNOSIS — E1122 Type 2 diabetes mellitus with diabetic chronic kidney disease: Secondary | ICD-10-CM | POA: Diagnosis not present

## 2016-01-08 ENCOUNTER — Ambulatory Visit: Payer: Commercial Managed Care - HMO | Attending: Family | Admitting: Family

## 2016-01-08 ENCOUNTER — Encounter: Payer: Self-pay | Admitting: Family

## 2016-01-08 VITALS — BP 136/59 | HR 54 | Resp 20 | Ht 68.0 in | Wt 193.0 lb

## 2016-01-08 DIAGNOSIS — Z881 Allergy status to other antibiotic agents status: Secondary | ICD-10-CM | POA: Diagnosis not present

## 2016-01-08 DIAGNOSIS — Z7982 Long term (current) use of aspirin: Secondary | ICD-10-CM | POA: Diagnosis not present

## 2016-01-08 DIAGNOSIS — I482 Chronic atrial fibrillation: Secondary | ICD-10-CM | POA: Diagnosis not present

## 2016-01-08 DIAGNOSIS — R001 Bradycardia, unspecified: Secondary | ICD-10-CM | POA: Diagnosis not present

## 2016-01-08 DIAGNOSIS — Z833 Family history of diabetes mellitus: Secondary | ICD-10-CM | POA: Insufficient documentation

## 2016-01-08 DIAGNOSIS — Z87891 Personal history of nicotine dependence: Secondary | ICD-10-CM | POA: Diagnosis not present

## 2016-01-08 DIAGNOSIS — E785 Hyperlipidemia, unspecified: Secondary | ICD-10-CM | POA: Diagnosis not present

## 2016-01-08 DIAGNOSIS — N184 Chronic kidney disease, stage 4 (severe): Secondary | ICD-10-CM | POA: Insufficient documentation

## 2016-01-08 DIAGNOSIS — I4819 Other persistent atrial fibrillation: Secondary | ICD-10-CM

## 2016-01-08 DIAGNOSIS — E1122 Type 2 diabetes mellitus with diabetic chronic kidney disease: Secondary | ICD-10-CM | POA: Diagnosis not present

## 2016-01-08 DIAGNOSIS — Z794 Long term (current) use of insulin: Secondary | ICD-10-CM | POA: Diagnosis not present

## 2016-01-08 DIAGNOSIS — I130 Hypertensive heart and chronic kidney disease with heart failure and stage 1 through stage 4 chronic kidney disease, or unspecified chronic kidney disease: Secondary | ICD-10-CM | POA: Insufficient documentation

## 2016-01-08 DIAGNOSIS — I5042 Chronic combined systolic (congestive) and diastolic (congestive) heart failure: Secondary | ICD-10-CM | POA: Diagnosis not present

## 2016-01-08 DIAGNOSIS — N183 Chronic kidney disease, stage 3 unspecified: Secondary | ICD-10-CM

## 2016-01-08 DIAGNOSIS — R809 Proteinuria, unspecified: Secondary | ICD-10-CM | POA: Diagnosis not present

## 2016-01-08 DIAGNOSIS — K219 Gastro-esophageal reflux disease without esophagitis: Secondary | ICD-10-CM | POA: Diagnosis not present

## 2016-01-08 DIAGNOSIS — Z888 Allergy status to other drugs, medicaments and biological substances status: Secondary | ICD-10-CM | POA: Insufficient documentation

## 2016-01-08 DIAGNOSIS — I481 Persistent atrial fibrillation: Secondary | ICD-10-CM | POA: Insufficient documentation

## 2016-01-08 DIAGNOSIS — I5032 Chronic diastolic (congestive) heart failure: Secondary | ICD-10-CM

## 2016-01-08 DIAGNOSIS — I10 Essential (primary) hypertension: Secondary | ICD-10-CM

## 2016-01-08 DIAGNOSIS — Z8249 Family history of ischemic heart disease and other diseases of the circulatory system: Secondary | ICD-10-CM | POA: Insufficient documentation

## 2016-01-08 DIAGNOSIS — I129 Hypertensive chronic kidney disease with stage 1 through stage 4 chronic kidney disease, or unspecified chronic kidney disease: Secondary | ICD-10-CM | POA: Diagnosis not present

## 2016-01-08 DIAGNOSIS — Z7901 Long term (current) use of anticoagulants: Secondary | ICD-10-CM | POA: Insufficient documentation

## 2016-01-08 NOTE — Patient Instructions (Signed)
Continue weighing daily and call for an overnight weight gain of > 2 pounds or a weekly weight gain of >5 pounds. 

## 2016-01-08 NOTE — Progress Notes (Signed)
Patient ID: Patrick [name], male    DOB: 1931-10-07, 82 y.o.   MRN: [id number]  HPI  [name] is a 82 y/o male with a history of chronic kidney disease (stage III-IV), diabetes on insulin, HTN, persistent atrial fibrillation and chronic diastolic heart failure with an EF of 60-65% as of 12/21/15 who returns for a f/u visit.  Last echo done 12/21/15 which showed an EF of 60-65%without any aortic stenosis and trivial tricuspid regurgitation. This is an improvement from his last echocardiogram which showed an EF of 40-45%.  Last hospital admission was 12/20/15 with exacerbation of heart failure. Was IV diuresed, was seen by cardiology and discharged home. Torsemide ordered for every other day. Previous hospital admission was August 2017 with pneumonia. Treated with antibiotics and prednisone.  Returns today for a f/u. Weighing himself daily and weight chart shows an overall stable weight. He is eating better and drinking glucerna 1-2 times daily. Not adding salt to his food. Some swelling in his lower legs. Taking torsemide on Tues & Thurs.  Chronic difficulty sleeping at night as he's sleeping some during the day. Drinking tea during the day, not much water  Past Medical History:  Diagnosis Date  . Amputated finger   . Anemia of chronic disease    a. plan of oncology to start Procrit - received during admission 12/2013  . Atypical chest pain    a. 12/2014 Neg CE - in setting of L pleural effusion.  . Bell's palsy   . Chronic combined systolic and diastolic CHF (congestive heart failure) ([place])    a. 11/2012 Echo: EF 60-65%, mod conc LVH, mildly dil LA/RA, mild Ao sclerosis w/o stenosis; b. 11/2014 Echo: EF 40-45%, prob mid-apicalanteroseptal, ant, apical HK, Gr2 DD, mod dil LA, mildly dil RA (technically difficult study).  . CKD (chronic kidney disease), stage III    a. stage III-IV  . Diabetes mellitus without complication ([place])   . GERD (gastroesophageal reflux disease)   . History  of gastroesophageal reflux (GERD)   . Hyperlipidemia   . Hypertension   . Permanent atrial fibrillation ([place])    a. Dx 12/2011, Rate-controlled, chronic Xarelto (renal dosing) - CHA2DS2VASc = 5.  . Pleural effusion, left    a. 12/2014 s/p thoracentesis - protein <3, LDH 123, no malignancy.    Past Surgical History:  Procedure Laterality Date  . APPENDECTOMY    . COLONOSCOPY  09/2011  . ESOPHAGOGASTRODUODENOSCOPY    . FINGER SURGERY     right hand    Family History  Problem Relation Age of Onset  . Heart attack Brother   . Diabetes Mother   . Diabetes Sister   . Hypertension      Social History  Substance Use Topics  . Smoking status: Former Smoker    Packs/day: 0.25    Years: 10.00    Types: Cigars    Quit date: 05/06/1966  . Smokeless tobacco: Never Used  . Alcohol use No    Allergies  Allergen Reactions  . Levofloxacin Other (See Comments)    confusion  . Prednisone Other (See Comments)    confusion    Prior to Admission medications   Medication Sig Start Date End Date Taking? Authorizing Provider  aspirin 81 MG tablet Take 81 mg by mouth daily.   Yes Historical Provider, MD  cloNIDine (CATAPRES) 0.1 MG tablet Take 1 tablet (0.1 mg total) by mouth 2 (two) times daily. 09/20/15  Yes Alisa Graff, FNP  enalapril (VASOTEC)  5 MG tablet Take 1 tablet (5 mg total) by mouth daily. 09/20/15  Yes Alisa Graff, FNP  Insulin Lispro Prot & Lispro (HUMALOG MIX 75/25 [place]) Inject 70 Units into the skin 2 (two) times daily. Don't take if under 150   Yes Historical Provider, MD  metoprolol (LOPRESSOR) 25 MG tablet Take 1 tablet (25 mg total) by mouth 2 (two) times daily. 12/22/15  Yes Henreitta Leber, MD  omeprazole (PRILOSEC) 40 MG capsule Take 1 capsule (40 mg total) by mouth daily. 09/20/15  Yes Alisa Graff, FNP  potassium chloride 20 MEQ TBCR Take 20 mEq by mouth every other day. Patient taking differently: Take 20 mEq by mouth every other day. Tuesday and Thursday if fluid  on board 12/22/15  Yes Henreitta Leber, MD  pravastatin (PRAVACHOL) 40 MG tablet Take 2 tablets (80 mg total) by mouth daily. Take two tablets daily. 09/20/15  Yes Alisa Graff, FNP  Rivaroxaban (XARELTO) 15 MG TABS tablet Take 1 tablet (15 mg total) by mouth daily. 05/05/12  Yes Minna Merritts, MD  torsemide (DEMADEX) 20 MG tablet Take 1 tablet (20 mg total) by mouth every other day. Patient taking differently: Take 20 mg by mouth every other day. As needed on Tues/Thurs for fluid 12/22/15  Yes Henreitta Leber, MD    Review of Systems  Constitutional: Negative for appetite change and fatigue.  HENT: Negative for congestion, postnasal drip and sore throat.   Eyes: Negative.   Respiratory: Positive for shortness of breath. Negative for chest tightness and wheezing.   Cardiovascular: Positive for leg swelling. Negative for chest pain and palpitations.  Gastrointestinal: Negative for abdominal distention and abdominal pain.  Endocrine: Negative.   Genitourinary: Negative.   Musculoskeletal: Negative for back pain and neck pain.  Skin: Negative.   Allergic/Immunologic: Negative.   Neurological: Negative for dizziness and light-headedness.  Hematological: Negative for adenopathy. Bruises/bleeds easily.  Psychiatric/Behavioral: Negative for dysphoric mood and sleep disturbance (sleeping on 2 pillows with oxygen at 2L). The patient is not nervous/anxious.     Vitals:   01/08/16 0915  BP: (!) 136/59  Pulse: (!) 54  Resp: 20  SpO2: (!) 89%  Weight: 193 lb (87.5 kg)  Height: 5\' 8"  (1.727 m)     Physical Exam  Constitutional: He is oriented to person, place, and time. He appears well-developed and well-nourished.  HENT:  Head: Normocephalic and atraumatic.  Eyes: Conjunctivae are normal. Pupils are equal, round, and reactive to light.  Neck: Normal range of motion. Neck supple.  Cardiovascular: An irregular rhythm present. Bradycardia present.   Pulmonary/Chest: Effort normal. He has  no wheezes. He has no rales.  Abdominal: Soft. He exhibits no distension. There is no tenderness.  Musculoskeletal: He exhibits edema (1+ pitting edema in bilateral lower legs). He exhibits no tenderness.  Neurological: He is alert and oriented to person, place, and time.  Skin: Skin is warm and dry.  Psychiatric: He has a normal mood and affect. His behavior is normal. Thought content normal.  Nursing note and vitals reviewed.  Assessment & Plan:  1: Chronic heart failure with preserved ejection fraction-  - NYHA class II - mild fluid overload today. To take torsemide today in addition to his Tues/Thurs dose - Continue daily weights and call for an overnight weight gain of >2 pounds or weekly weight gain of >5 pounds. - elevate legs when sitting at home  2: HTN- -Blood pressure looks good today -Home blood pressure readings  reviewed and also are elevated at times. Unable to titrate betablocker as he's bradycardic. Limited as well due to renal function. -Wife is to continue checking blood pressure. -Took his medications today before coming to the appointment.  3: Persistent atrial fibrillation- -Bradycardic today & had been running in the 70's-80's.  -No bleeding noted with xarelto  4: CKD, stage III-IV- -Follows closely with nephrology and has f/u appointment today. -Remains uninterested in dialysis  Medication bottles were reviewed.  Return here in 2 months or sooner for any questions/problems before then.

## 2016-01-09 ENCOUNTER — Telehealth: Payer: Self-pay | Admitting: Cardiovascular Disease

## 2016-01-09 DIAGNOSIS — E1122 Type 2 diabetes mellitus with diabetic chronic kidney disease: Secondary | ICD-10-CM | POA: Diagnosis not present

## 2016-01-09 DIAGNOSIS — I481 Persistent atrial fibrillation: Secondary | ICD-10-CM | POA: Diagnosis not present

## 2016-01-09 DIAGNOSIS — J9601 Acute respiratory failure with hypoxia: Secondary | ICD-10-CM | POA: Diagnosis not present

## 2016-01-09 DIAGNOSIS — I130 Hypertensive heart and chronic kidney disease with heart failure and stage 1 through stage 4 chronic kidney disease, or unspecified chronic kidney disease: Secondary | ICD-10-CM | POA: Diagnosis not present

## 2016-01-09 DIAGNOSIS — E785 Hyperlipidemia, unspecified: Secondary | ICD-10-CM | POA: Diagnosis not present

## 2016-01-09 DIAGNOSIS — Z9981 Dependence on supplemental oxygen: Secondary | ICD-10-CM | POA: Diagnosis not present

## 2016-01-09 DIAGNOSIS — N183 Chronic kidney disease, stage 3 (moderate): Secondary | ICD-10-CM | POA: Diagnosis not present

## 2016-01-09 DIAGNOSIS — I5043 Acute on chronic combined systolic (congestive) and diastolic (congestive) heart failure: Secondary | ICD-10-CM | POA: Diagnosis not present

## 2016-01-09 DIAGNOSIS — D638 Anemia in other chronic diseases classified elsewhere: Secondary | ICD-10-CM | POA: Diagnosis not present

## 2016-01-09 NOTE — Telephone Encounter (Signed)
Spoke with Mendel Ryder and let her know that he could continue with the physical therapy and to give [place] a call if they have any further questions. She verbalized understanding and states that she sent over an order for extra visits. Notified Mandi and she is aware of paperwork needed.

## 2016-01-09 NOTE — Telephone Encounter (Signed)
Hold metoprolol. Monitor heart rate over the next several days. May need to adjust/hold clonidine if bradycardia persists, defer to primary cardiologist.

## 2016-01-09 NOTE — Telephone Encounter (Signed)
Patrick [name] with [facility] calling asking if patient needs to continue his Physical Therapy this week or not.  Please advise.

## 2016-01-09 NOTE — Telephone Encounter (Signed)
Left detailed message on Thomas's vm w/ Ryan's recommendation.  Asked him to call back in a day or 2 if readings do not improve.

## 2016-01-09 NOTE — Telephone Encounter (Signed)
Patrick [name] with [facility]  Calling stating pt hr is pretty low Just would like to know what needs to be done Protocol for them is to call us He is not in stress, HR is 30-40 usually it is in 50-60's Not sure if we made med changes or not  Please advise.

## 2016-01-11 DIAGNOSIS — E785 Hyperlipidemia, unspecified: Secondary | ICD-10-CM | POA: Diagnosis not present

## 2016-01-11 DIAGNOSIS — I481 Persistent atrial fibrillation: Secondary | ICD-10-CM | POA: Diagnosis not present

## 2016-01-11 DIAGNOSIS — E1122 Type 2 diabetes mellitus with diabetic chronic kidney disease: Secondary | ICD-10-CM | POA: Diagnosis not present

## 2016-01-11 DIAGNOSIS — I5043 Acute on chronic combined systolic (congestive) and diastolic (congestive) heart failure: Secondary | ICD-10-CM | POA: Diagnosis not present

## 2016-01-11 DIAGNOSIS — J9601 Acute respiratory failure with hypoxia: Secondary | ICD-10-CM | POA: Diagnosis not present

## 2016-01-11 DIAGNOSIS — N183 Chronic kidney disease, stage 3 (moderate): Secondary | ICD-10-CM | POA: Diagnosis not present

## 2016-01-11 DIAGNOSIS — I130 Hypertensive heart and chronic kidney disease with heart failure and stage 1 through stage 4 chronic kidney disease, or unspecified chronic kidney disease: Secondary | ICD-10-CM | POA: Diagnosis not present

## 2016-01-11 DIAGNOSIS — D638 Anemia in other chronic diseases classified elsewhere: Secondary | ICD-10-CM | POA: Diagnosis not present

## 2016-01-11 DIAGNOSIS — Z9981 Dependence on supplemental oxygen: Secondary | ICD-10-CM | POA: Diagnosis not present

## 2016-01-12 DIAGNOSIS — I5043 Acute on chronic combined systolic (congestive) and diastolic (congestive) heart failure: Secondary | ICD-10-CM | POA: Diagnosis not present

## 2016-01-12 DIAGNOSIS — E785 Hyperlipidemia, unspecified: Secondary | ICD-10-CM | POA: Diagnosis not present

## 2016-01-12 DIAGNOSIS — I130 Hypertensive heart and chronic kidney disease with heart failure and stage 1 through stage 4 chronic kidney disease, or unspecified chronic kidney disease: Secondary | ICD-10-CM | POA: Diagnosis not present

## 2016-01-12 DIAGNOSIS — E1122 Type 2 diabetes mellitus with diabetic chronic kidney disease: Secondary | ICD-10-CM | POA: Diagnosis not present

## 2016-01-12 DIAGNOSIS — D638 Anemia in other chronic diseases classified elsewhere: Secondary | ICD-10-CM | POA: Diagnosis not present

## 2016-01-12 DIAGNOSIS — J9601 Acute respiratory failure with hypoxia: Secondary | ICD-10-CM | POA: Diagnosis not present

## 2016-01-12 DIAGNOSIS — N183 Chronic kidney disease, stage 3 (moderate): Secondary | ICD-10-CM | POA: Diagnosis not present

## 2016-01-12 DIAGNOSIS — Z9981 Dependence on supplemental oxygen: Secondary | ICD-10-CM | POA: Diagnosis not present

## 2016-01-12 DIAGNOSIS — I481 Persistent atrial fibrillation: Secondary | ICD-10-CM | POA: Diagnosis not present

## 2016-01-16 ENCOUNTER — Telehealth: Payer: Self-pay | Admitting: Cardiovascular Disease

## 2016-01-16 DIAGNOSIS — D638 Anemia in other chronic diseases classified elsewhere: Secondary | ICD-10-CM | POA: Diagnosis not present

## 2016-01-16 DIAGNOSIS — E785 Hyperlipidemia, unspecified: Secondary | ICD-10-CM | POA: Diagnosis not present

## 2016-01-16 DIAGNOSIS — I130 Hypertensive heart and chronic kidney disease with heart failure and stage 1 through stage 4 chronic kidney disease, or unspecified chronic kidney disease: Secondary | ICD-10-CM | POA: Diagnosis not present

## 2016-01-16 DIAGNOSIS — J9601 Acute respiratory failure with hypoxia: Secondary | ICD-10-CM | POA: Diagnosis not present

## 2016-01-16 DIAGNOSIS — I5043 Acute on chronic combined systolic (congestive) and diastolic (congestive) heart failure: Secondary | ICD-10-CM | POA: Diagnosis not present

## 2016-01-16 DIAGNOSIS — Z9981 Dependence on supplemental oxygen: Secondary | ICD-10-CM | POA: Diagnosis not present

## 2016-01-16 DIAGNOSIS — E1122 Type 2 diabetes mellitus with diabetic chronic kidney disease: Secondary | ICD-10-CM | POA: Diagnosis not present

## 2016-01-16 DIAGNOSIS — N183 Chronic kidney disease, stage 3 (moderate): Secondary | ICD-10-CM | POA: Diagnosis not present

## 2016-01-16 DIAGNOSIS — I481 Persistent atrial fibrillation: Secondary | ICD-10-CM | POA: Diagnosis not present

## 2016-01-16 NOTE — Telephone Encounter (Signed)
Spoke with Patrick [name] from home health and she states that patient has been holding metoprolol and today his blood pressure was 158/84 and heart rate of 88. She reports that his wife had numbers in the 130's over 70's range and heart rates have been in the 60's but today it was 88. She states that he remains off the metoprolol and wanted to make sure he doesn't need to restart it. Let her know I would forward to PA to see if he wants to restart or continue to hold. She verbalized understanding and had no further questions.

## 2016-01-16 NOTE — Telephone Encounter (Signed)
Mendel Ryder from [facility] calling stating they called in a couple weeks ago for patient was having  hr drop with exercise Pt has told to hold metoprolol  Had now done this for a few weeks Today patient BP was 158/84 HR 88  Wondered if they should lower the dose or not add it back in Please advise.

## 2016-01-17 DIAGNOSIS — I481 Persistent atrial fibrillation: Secondary | ICD-10-CM | POA: Diagnosis not present

## 2016-01-17 DIAGNOSIS — I130 Hypertensive heart and chronic kidney disease with heart failure and stage 1 through stage 4 chronic kidney disease, or unspecified chronic kidney disease: Secondary | ICD-10-CM | POA: Diagnosis not present

## 2016-01-17 DIAGNOSIS — J9601 Acute respiratory failure with hypoxia: Secondary | ICD-10-CM | POA: Diagnosis not present

## 2016-01-17 DIAGNOSIS — D638 Anemia in other chronic diseases classified elsewhere: Secondary | ICD-10-CM | POA: Diagnosis not present

## 2016-01-17 DIAGNOSIS — N183 Chronic kidney disease, stage 3 (moderate): Secondary | ICD-10-CM | POA: Diagnosis not present

## 2016-01-17 DIAGNOSIS — E785 Hyperlipidemia, unspecified: Secondary | ICD-10-CM | POA: Diagnosis not present

## 2016-01-17 DIAGNOSIS — E1122 Type 2 diabetes mellitus with diabetic chronic kidney disease: Secondary | ICD-10-CM | POA: Diagnosis not present

## 2016-01-17 DIAGNOSIS — Z9981 Dependence on supplemental oxygen: Secondary | ICD-10-CM | POA: Diagnosis not present

## 2016-01-17 DIAGNOSIS — I5043 Acute on chronic combined systolic (congestive) and diastolic (congestive) heart failure: Secondary | ICD-10-CM | POA: Diagnosis not present

## 2016-01-17 NOTE — Telephone Encounter (Signed)
Can restart metoprolol 12.5 mg bid. If heart rate drops again consider changing clonidine.

## 2016-01-17 NOTE — Telephone Encounter (Signed)
Left detailed voicemail message for Mendel Ryder with Home health that patient could restart metoprolol 12.5 mg twice daily which would be 1/2 tablet since he has the 25 mg tablet and that I would call the patient and make them aware as well with instructions to call back if she has any further questions.

## 2016-01-17 NOTE — Telephone Encounter (Signed)
Left voicemail message on mobile number for patient to call back regarding medication change with Metoprolol.

## 2016-01-18 MED ORDER — METOPROLOL TARTRATE 25 MG PO TABS: 12.5000 mg | ORAL_TABLET | Freq: Two times a day (BID) | ORAL | 3 refills | Status: DC

## 2016-01-18 NOTE — Telephone Encounter (Addendum)
Spoke with patients wife regarding metoprolol. Let her know that patient can start taking metoprolol 25 mg, half a tablet twice daily. Let her know that I notified home health nurse that called in and that I would send in a new prescription as well. She was appreciative for the call and had no further quesitons.

## 2016-01-19 ENCOUNTER — Telehealth: Payer: Self-pay | Admitting: Cardiovascular Disease

## 2016-01-19 MED ORDER — METOPROLOL TARTRATE 25 MG PO TABS: 12.5000 mg | ORAL_TABLET | Freq: Two times a day (BID) | ORAL | 0 refills | Status: DC

## 2016-01-19 NOTE — Telephone Encounter (Signed)
Mendel Ryder from [place] in a couple days ago about BP and HR  Was told to ask patient to take 12.5 metoprolol 2 a day  Wife called her last night telling her they don't have 25 mg tablets  They only have 50 mg, there is no way to cut that and get the correct dose we asked patient to do Would like to know what needs to be done  Please advise.

## 2016-01-19 NOTE — Telephone Encounter (Signed)
Left message for Ria Comment that metoprolol 25 mg tabs were sent in yesterday w/ instructions to cut in 1/2, but this rx was sent to mail order pharmacy. Advised her that I am sending a smaller amount to pt's local pharmacy, [facility] on [address], so that he will have enough until mail order meds arrive. Asked her to call back w/ any questions or concerns.

## 2016-01-22 ENCOUNTER — Emergency Department: Payer: Commercial Managed Care - HMO

## 2016-01-22 ENCOUNTER — Inpatient Hospital Stay (HOSPITAL_COMMUNITY)
Admit: 2016-01-22 | Discharge: 2016-01-22 | Disposition: A | Discharge status: Home / Self Care | Payer: Commercial Managed Care - HMO | Attending: Internal Medicine | Admitting: Internal Medicine

## 2016-01-22 ENCOUNTER — Encounter: Payer: Self-pay | Admitting: *Deleted

## 2016-01-22 ENCOUNTER — Inpatient Hospital Stay
Admission: EM | Admit: 2016-01-22 | Discharge: 2016-01-24 | DRG: 291 | Disposition: A | Discharge status: Home / Self Care | Payer: Commercial Managed Care - HMO | Attending: Internal Medicine | Admitting: Internal Medicine

## 2016-01-22 DIAGNOSIS — Z888 Allergy status to other drugs, medicaments and biological substances status: Secondary | ICD-10-CM | POA: Diagnosis not present

## 2016-01-22 DIAGNOSIS — Z66 Do not resuscitate: Secondary | ICD-10-CM | POA: Diagnosis present

## 2016-01-22 DIAGNOSIS — Z881 Allergy status to other antibiotic agents status: Secondary | ICD-10-CM

## 2016-01-22 DIAGNOSIS — N184 Chronic kidney disease, stage 4 (severe): Secondary | ICD-10-CM | POA: Diagnosis present

## 2016-01-22 DIAGNOSIS — I5043 Acute on chronic combined systolic (congestive) and diastolic (congestive) heart failure: Secondary | ICD-10-CM | POA: Insufficient documentation

## 2016-01-22 DIAGNOSIS — R0902 Hypoxemia: Secondary | ICD-10-CM | POA: Diagnosis not present

## 2016-01-22 DIAGNOSIS — J9621 Acute and chronic respiratory failure with hypoxia: Secondary | ICD-10-CM | POA: Diagnosis present

## 2016-01-22 DIAGNOSIS — E1122 Type 2 diabetes mellitus with diabetic chronic kidney disease: Secondary | ICD-10-CM | POA: Diagnosis present

## 2016-01-22 DIAGNOSIS — N183 Chronic kidney disease, stage 3 unspecified: Secondary | ICD-10-CM | POA: Diagnosis present

## 2016-01-22 DIAGNOSIS — E876 Hypokalemia: Secondary | ICD-10-CM | POA: Diagnosis present

## 2016-01-22 DIAGNOSIS — R0602 Shortness of breath: Secondary | ICD-10-CM

## 2016-01-22 DIAGNOSIS — Z794 Long term (current) use of insulin: Secondary | ICD-10-CM | POA: Diagnosis not present

## 2016-01-22 DIAGNOSIS — I509 Heart failure, unspecified: Secondary | ICD-10-CM

## 2016-01-22 DIAGNOSIS — R0600 Dyspnea, unspecified: Secondary | ICD-10-CM

## 2016-01-22 DIAGNOSIS — K219 Gastro-esophageal reflux disease without esophagitis: Secondary | ICD-10-CM | POA: Diagnosis present

## 2016-01-22 DIAGNOSIS — E785 Hyperlipidemia, unspecified: Secondary | ICD-10-CM | POA: Diagnosis present

## 2016-01-22 DIAGNOSIS — Z79899 Other long term (current) drug therapy: Secondary | ICD-10-CM

## 2016-01-22 DIAGNOSIS — I130 Hypertensive heart and chronic kidney disease with heart failure and stage 1 through stage 4 chronic kidney disease, or unspecified chronic kidney disease: Secondary | ICD-10-CM | POA: Diagnosis not present

## 2016-01-22 DIAGNOSIS — I5032 Chronic diastolic (congestive) heart failure: Secondary | ICD-10-CM | POA: Diagnosis not present

## 2016-01-22 DIAGNOSIS — I110 Hypertensive heart disease with heart failure: Secondary | ICD-10-CM | POA: Diagnosis not present

## 2016-01-22 DIAGNOSIS — E119 Type 2 diabetes mellitus without complications: Secondary | ICD-10-CM

## 2016-01-22 DIAGNOSIS — Z8249 Family history of ischemic heart disease and other diseases of the circulatory system: Secondary | ICD-10-CM | POA: Diagnosis not present

## 2016-01-22 DIAGNOSIS — R05 Cough: Secondary | ICD-10-CM | POA: Diagnosis not present

## 2016-01-22 DIAGNOSIS — I272 Pulmonary hypertension, unspecified: Secondary | ICD-10-CM | POA: Diagnosis present

## 2016-01-22 DIAGNOSIS — I481 Persistent atrial fibrillation: Secondary | ICD-10-CM | POA: Diagnosis not present

## 2016-01-22 DIAGNOSIS — I4891 Unspecified atrial fibrillation: Secondary | ICD-10-CM | POA: Diagnosis not present

## 2016-01-22 DIAGNOSIS — Z7901 Long term (current) use of anticoagulants: Secondary | ICD-10-CM | POA: Diagnosis not present

## 2016-01-22 DIAGNOSIS — Z7982 Long term (current) use of aspirin: Secondary | ICD-10-CM | POA: Diagnosis not present

## 2016-01-22 DIAGNOSIS — D638 Anemia in other chronic diseases classified elsewhere: Secondary | ICD-10-CM | POA: Diagnosis present

## 2016-01-22 DIAGNOSIS — Z833 Family history of diabetes mellitus: Secondary | ICD-10-CM

## 2016-01-22 DIAGNOSIS — D649 Anemia, unspecified: Secondary | ICD-10-CM | POA: Diagnosis not present

## 2016-01-22 DIAGNOSIS — Z9981 Dependence on supplemental oxygen: Secondary | ICD-10-CM

## 2016-01-22 DIAGNOSIS — E782 Mixed hyperlipidemia: Secondary | ICD-10-CM | POA: Diagnosis present

## 2016-01-22 DIAGNOSIS — I119 Hypertensive heart disease without heart failure: Secondary | ICD-10-CM | POA: Diagnosis present

## 2016-01-22 DIAGNOSIS — Z87891 Personal history of nicotine dependence: Secondary | ICD-10-CM

## 2016-01-22 DIAGNOSIS — I5031 Acute diastolic (congestive) heart failure: Secondary | ICD-10-CM | POA: Diagnosis not present

## 2016-01-22 DIAGNOSIS — J9620 Acute and chronic respiratory failure, unspecified whether with hypoxia or hypercapnia: Secondary | ICD-10-CM

## 2016-01-22 DIAGNOSIS — J9811 Atelectasis: Secondary | ICD-10-CM | POA: Diagnosis not present

## 2016-01-22 DIAGNOSIS — I4819 Other persistent atrial fibrillation: Secondary | ICD-10-CM | POA: Diagnosis present

## 2016-01-22 LAB — BASIC METABOLIC PANEL
ANION GAP: 10 (ref 5–15)
BUN: 30 mg/dL — ABNORMAL HIGH (ref 6–20)
CALCIUM: 9.1 mg/dL (ref 8.9–10.3)
CO2: 27 mmol/L (ref 22–32)
Chloride: 99 mmol/L — ABNORMAL LOW (ref 101–111)
Creatinine, Ser: 1.91 mg/dL — ABNORMAL HIGH (ref 0.61–1.24)
GFR calc Af Amer: 36 mL/min — ABNORMAL LOW (ref >60)
GFR, EST NON AFRICAN AMERICAN: 31 mL/min — AB (ref >60)
GLUCOSE: 171 mg/dL — AB (ref 65–99)
Potassium: 4.8 mmol/L (ref 3.5–5.1)
Sodium: 136 mmol/L (ref 135–145)

## 2016-01-22 LAB — CBC
HCT: 26.2 % — ABNORMAL LOW (ref 40.0–52.0)
HEMOGLOBIN: 8.6 g/dL — AB (ref 13.0–18.0)
MCH: 29.3 pg (ref 26.0–34.0)
MCHC: 32.8 g/dL (ref 32.0–36.0)
MCV: 89.1 fL (ref 80.0–100.0)
PLATELETS: 279 10*3/uL (ref 150–440)
RBC: 2.94 MIL/uL — ABNORMAL LOW (ref 4.40–5.90)
RDW: 16.9 % — AB (ref 11.5–14.5)
WBC: 11.3 10*3/uL — ABNORMAL HIGH (ref 3.8–10.6)

## 2016-01-22 LAB — MAGNESIUM: Magnesium: 2.1 mg/dL (ref 1.7–2.4)

## 2016-01-22 LAB — BRAIN NATRIURETIC PEPTIDE: B NATRIURETIC PEPTIDE 5: 508 pg/mL — AB (ref 0.0–100.0)

## 2016-01-22 LAB — GLUCOSE, CAPILLARY: GLUCOSE-CAPILLARY: 142 mg/dL — AB (ref 65–99)

## 2016-01-22 LAB — TROPONIN I: TROPONIN I: 0.03 ng/mL — AB (ref <0.03)

## 2016-01-22 MED ORDER — SODIUM CHLORIDE 0.9% FLUSH
3.0000 mL | Freq: Two times a day (BID) | INTRAVENOUS | Status: DC
  Administered 2016-01-22 – 2016-01-24 (×4): 3 mL via INTRAVENOUS

## 2016-01-22 MED ORDER — ASPIRIN 81 MG PO TABS: 81.0000 mg | ORAL_TABLET | Freq: Every day | ORAL | Status: DC

## 2016-01-22 MED ORDER — ENALAPRIL MALEATE 5 MG PO TABS
5.0000 mg | ORAL_TABLET | Freq: Every day | ORAL | Status: DC
  Administered 2016-01-23: 5 mg via ORAL
  Filled 2016-01-22: qty 1

## 2016-01-22 MED ORDER — INSULIN ASPART PROT & ASPART (70-30 MIX) 100 UNIT/ML ~~LOC~~ SUSP
70.0000 [IU] | Freq: Two times a day (BID) | SUBCUTANEOUS | Status: DC
  Administered 2016-01-22: 70 [IU] via SUBCUTANEOUS
  Filled 2016-01-22 (×2): qty 70

## 2016-01-22 MED ORDER — FUROSEMIDE 10 MG/ML IJ SOLN
40.0000 mg | Freq: Two times a day (BID) | INTRAMUSCULAR | Status: DC
  Administered 2016-01-22 – 2016-01-23 (×2): 40 mg via INTRAVENOUS
  Filled 2016-01-22 (×2): qty 4

## 2016-01-22 MED ORDER — ALBUTEROL SULFATE (2.5 MG/3ML) 0.083% IN NEBU: 2.5000 mg | INHALATION_SOLUTION | RESPIRATORY_TRACT | Status: DC | PRN

## 2016-01-22 MED ORDER — RIVAROXABAN 15 MG PO TABS
15.0000 mg | ORAL_TABLET | Freq: Every day | ORAL | Status: DC
  Administered 2016-01-22 – 2016-01-23 (×2): 15 mg via ORAL
  Filled 2016-01-22 (×3): qty 1

## 2016-01-22 MED ORDER — ASPIRIN EC 81 MG PO TBEC
81.0000 mg | DELAYED_RELEASE_TABLET | Freq: Every day | ORAL | Status: DC
  Administered 2016-01-23: 81 mg via ORAL
  Filled 2016-01-22: qty 1

## 2016-01-22 MED ORDER — CLONIDINE HCL 0.1 MG PO TABS
0.1000 mg | ORAL_TABLET | Freq: Two times a day (BID) | ORAL | Status: DC
  Administered 2016-01-22 – 2016-01-23 (×3): 0.1 mg via ORAL
  Filled 2016-01-22 (×3): qty 1

## 2016-01-22 MED ORDER — PANTOPRAZOLE SODIUM 40 MG PO TBEC
40.0000 mg | DELAYED_RELEASE_TABLET | Freq: Every day | ORAL | Status: DC
  Administered 2016-01-23 – 2016-01-24 (×2): 40 mg via ORAL
  Filled 2016-01-22 (×2): qty 1

## 2016-01-22 MED ORDER — SODIUM CHLORIDE 0.9% FLUSH
3.0000 mL | INTRAVENOUS | Status: DC | PRN
  Administered 2016-01-23: 3 mL via INTRAVENOUS
  Filled 2016-01-22: qty 3

## 2016-01-22 MED ORDER — INSULIN LISPRO PROT & LISPRO (75-25 MIX) 100 UNIT/ML ~~LOC~~ SUSP: 70.0000 [IU] | Freq: Two times a day (BID) | SUBCUTANEOUS | Status: DC

## 2016-01-22 MED ORDER — ONDANSETRON HCL 4 MG/2ML IJ SOLN: 4.0000 mg | Freq: Four times a day (QID) | INTRAMUSCULAR | Status: DC | PRN

## 2016-01-22 MED ORDER — ACETAMINOPHEN 325 MG PO TABS: 650.0000 mg | ORAL_TABLET | Freq: Four times a day (QID) | ORAL | Status: DC | PRN

## 2016-01-22 MED ORDER — FUROSEMIDE 10 MG/ML IJ SOLN
60.0000 mg | Freq: Once | INTRAMUSCULAR | Status: AC
  Administered 2016-01-22: 60 mg via INTRAVENOUS
  Filled 2016-01-22: qty 8

## 2016-01-22 MED ORDER — PRAVASTATIN SODIUM 40 MG PO TABS
80.0000 mg | ORAL_TABLET | Freq: Every day | ORAL | Status: DC
  Administered 2016-01-23 – 2016-01-24 (×2): 80 mg via ORAL
  Filled 2016-01-22 (×3): qty 2

## 2016-01-22 MED ORDER — SODIUM CHLORIDE 0.9 % IV SOLN: 250.0000 mL | INTRAVENOUS | Status: DC | PRN

## 2016-01-22 MED ORDER — ACETAMINOPHEN 650 MG RE SUPP: 650.0000 mg | Freq: Four times a day (QID) | RECTAL | Status: DC | PRN

## 2016-01-22 MED ORDER — ONDANSETRON HCL 4 MG PO TABS: 4.0000 mg | ORAL_TABLET | Freq: Four times a day (QID) | ORAL | Status: DC | PRN

## 2016-01-22 MED ORDER — METOPROLOL TARTRATE 25 MG PO TABS
12.5000 mg | ORAL_TABLET | Freq: Two times a day (BID) | ORAL | Status: DC
  Administered 2016-01-22 – 2016-01-23 (×3): 12.5 mg via ORAL
  Filled 2016-01-22 (×3): qty 1

## 2016-01-22 NOTE — ED Notes (Signed)
Pts family reports that pt had been verbalizing that he did not feel as though he was getting oxygen from his home oxygen machine. Upon assessment no airflow noted by RN at this time. Family is contacting company to ask about air tank and possible causes.

## 2016-01-22 NOTE — Progress Notes (Signed)
Per protocol, Humalog 75/25 70 units BIDWM substituted to Novolog 70/30 70 units BIDWM.   MLS.

## 2016-01-22 NOTE — ED Triage Notes (Signed)
Pt arrived to ED reporting new onset of SOB and oxygen stats in the 70s at home on 2L. Pt's wife reports he had a 30 minute episode last night of nonproductive coughing. Pt denies having had a fever at home. Pts wife reports she noticed he was gaining weight and gave pt a "fluid pill" last night. Pt presents with bilateral +1 edema on both lower extremities. Pt now placed on 4L of oxygen and stating 98% with equal bilateral chest expansion. And decreased WOB since arrival to ED.

## 2016-01-22 NOTE — H&P (Addendum)
[facility] [place] NAME: Patrick [name]    MR#:  [id number]  DATE OF BIRTH:  1931-09-06  DATE OF ADMISSION:  01/22/2016  PRIMARY CARE PHYSICIAN: Maryland Pink, MD   REQUESTING/REFERRING PHYSICIAN: Schuyler Amor, MD  CHIEF COMPLAINT:   Chief Complaint  Patient presents with  . Shortness of Breath   Cough, sputum and shortness of breath for 2 days. HISTORY OF PRESENT ILLNESS:  Patrick [name]  is a 82 y.o. male with a known history of Chronic CHF, hypertension, hyperlipidemia and A. fib. The patient presents in the ED with cough, shortness of breath and generalized weakness for a couple days. In addition, he complains of orthopnea, nocturnal dyspnea and leg edema. He has dyspnea on exertion with a few steps for the past few days. Chest x-ray show pulmonary edema with mild pleural effusion. He is on oxygen 2 L chronic respiratory failure, now on 4 Ls due to hypoxia wtih O2 saturation at 86%.  PAST MEDICAL HISTORY:   Past Medical History:  Diagnosis Date  . Amputated finger   . Anemia of chronic disease    a. plan of oncology to start Procrit - received during admission 12/2013  . Atypical chest pain    a. 12/2014 Neg CE - in setting of L pleural effusion.  . Bell's palsy   . Chronic combined systolic and diastolic CHF (congestive heart failure) ([place])    a. 11/2012 Echo: EF 60-65%, mod conc LVH, mildly dil LA/RA, mild Ao sclerosis w/o stenosis; b. 11/2014 Echo: EF 40-45%, prob mid-apicalanteroseptal, ant, apical HK, Gr2 DD, mod dil LA, mildly dil RA (technically difficult study).  . CKD (chronic kidney disease), stage III    a. stage III-IV  . Diabetes mellitus without complication ([place])   . GERD (gastroesophageal reflux disease)   . History of gastroesophageal reflux (GERD)   . Hyperlipidemia   . Hypertension   . Permanent atrial fibrillation ([place])    a. Dx 12/2011, Rate-controlled, chronic Xarelto (renal dosing) -  CHA2DS2VASc = 5.  . Pleural effusion, left    a. 12/2014 s/p thoracentesis - protein <3, LDH 123, no malignancy.    PAST SURGICAL HISTORY:   Past Surgical History:  Procedure Laterality Date  . APPENDECTOMY    . COLONOSCOPY  09/2011  . ESOPHAGOGASTRODUODENOSCOPY    . FINGER SURGERY     right hand    SOCIAL HISTORY:   Social History  Substance Use Topics  . Smoking status: Former Smoker    Packs/day: 0.25    Years: 10.00    Types: Cigars    Quit date: 05/06/1966  . Smokeless tobacco: Never Used  . Alcohol use No    FAMILY HISTORY:   Family History  Problem Relation Age of Onset  . Heart attack Brother   . Diabetes Mother   . Diabetes Sister   . Hypertension      DRUG ALLERGIES:   Allergies  Allergen Reactions  . Levofloxacin Other (See Comments)    confusion  . Prednisone Other (See Comments)    confusion    REVIEW OF SYSTEMS:   Review of Systems  Constitutional: Positive for malaise/fatigue. Negative for chills and fever.  Eyes: Negative for blurred vision and double vision.  Respiratory: Positive for cough, sputum production and shortness of breath. Negative for hemoptysis, wheezing and stridor.   Cardiovascular: Positive for orthopnea and leg swelling. Negative for chest pain and palpitations.  Gastrointestinal: Positive for nausea. Negative for  abdominal pain, diarrhea and vomiting.  Genitourinary: Negative for dysuria and urgency.  Musculoskeletal: Negative for joint pain.  Skin: Negative for itching and rash.  Neurological: Positive for weakness. Negative for dizziness, focal weakness, loss of consciousness and headaches.  Psychiatric/Behavioral: Negative for depression. The patient is not nervous/anxious.     MEDICATIONS AT HOME:   Prior to Admission medications   Medication Sig Start Date End Date Taking? Authorizing Provider  aspirin 81 MG tablet Take 81 mg by mouth daily.   Yes Historical Provider, MD  cloNIDine (CATAPRES) 0.1 MG tablet Take  1 tablet (0.1 mg total) by mouth 2 (two) times daily. 09/20/15  Yes Alisa Graff, FNP  enalapril (VASOTEC) 5 MG tablet Take 1 tablet (5 mg total) by mouth daily. 09/20/15  Yes Alisa Graff, FNP  Insulin Lispro Prot & Lispro (HUMALOG MIX 75/25 [place]) Inject 70 Units into the skin 2 (two) times daily. Don't take if under 150   Yes Historical Provider, MD  metoprolol tartrate (LOPRESSOR) 25 MG tablet Take 0.5 tablets (12.5 mg total) by mouth 2 (two) times daily. 01/19/16 04/18/16 Yes Minna Merritts, MD  omeprazole (PRILOSEC) 40 MG capsule Take 1 capsule (40 mg total) by mouth daily. 09/20/15  Yes Alisa Graff, FNP  potassium chloride 20 MEQ TBCR Take 20 mEq by mouth every other day. Patient taking differently: Take 20 mEq by mouth every other day. Tuesday and Thursday if fluid on board 12/22/15  Yes Henreitta Leber, MD  pravastatin (PRAVACHOL) 40 MG tablet Take 2 tablets (80 mg total) by mouth daily. Take two tablets daily. 09/20/15  Yes Alisa Graff, FNP  Rivaroxaban (XARELTO) 15 MG TABS tablet Take 1 tablet (15 mg total) by mouth daily. 05/05/12  Yes Minna Merritts, MD  torsemide (DEMADEX) 20 MG tablet Take 1 tablet (20 mg total) by mouth every other day. Patient taking differently: Take 20 mg by mouth every other day. As needed on Tues/Thurs for fluid 12/22/15  Yes Henreitta Leber, MD      VITAL SIGNS:  Blood pressure (!) 142/73, pulse 65, temperature 98 F (36.7 C), temperature source Oral, resp. rate 17, height 5\' 8"  (1.727 m), weight 189 lb 13.1 oz (86.1 kg), SpO2 98 %.  PHYSICAL EXAMINATION:  Physical Exam  GENERAL:  82 y.o.-year-old patient lying in the bed with Mild respiratory distress.  EYES: Pupils equal, round, reactive to light and accommodation. No scleral icterus. Extraocular muscles intact.  HEENT: Head atraumatic, normocephalic. Oropharynx and nasopharynx clear.  NECK:  Supple, no jugular venous distention. No thyroid enlargement, no tenderness.  LUNGS: Normal breath sounds  bilaterally, no wheezing,Bilateral basilar rales. No use of accessory muscles of respiration.  CARDIOVASCULAR: S1, S2 normal. No murmurs, rubs, or gallops.  ABDOMEN: Soft, nontender, nondistended. Bowel sounds present. No organomegaly or mass.  EXTREMITIES: Bilateral leg edema 1+, no cyanosis, or clubbing.  NEUROLOGIC: Cranial nerves II through XII are intact. Muscle strength 4/5 in all extremities. Sensation intact. Gait not checked.  PSYCHIATRIC: The patient is alert and oriented x 3.  SKIN: No obvious rash, lesion, or ulcer.   LABORATORY PANEL:   CBC  Recent Labs Lab 01/22/16 1239  WBC 11.3*  HGB 8.6*  HCT 26.2*  PLT 279   ------------------------------------------------------------------------------------------------------------------  Chemistries   Recent Labs Lab 01/22/16 1239  NA 136  K 4.8  CL 99*  CO2 27  GLUCOSE 171*  BUN 30*  CREATININE 1.91*  CALCIUM 9.1   ------------------------------------------------------------------------------------------------------------------  Cardiac Enzymes  Recent Labs Lab 01/22/16 1239  TROPONINI 0.03*   ------------------------------------------------------------------------------------------------------------------  RADIOLOGY:  Dg Chest 2 View  Result Date: 01/22/2016 CLINICAL DATA:  Shortness of breath, low oxygen saturation, nonproductive cough last night EXAM: CHEST  2 VIEW COMPARISON:  12/13/2015 FINDINGS: Enlargement of cardiac silhouette with slight pulmonary vascular congestion. Atherosclerotic calcification aorta. BILATERAL pulmonary infiltrates favoring pulmonary edema. Associated small pleural effusions. No pneumothorax. Bones demineralized. IMPRESSION: Probable CHF with small bibasilar effusions. Electronically Signed   By: Lavonia Dana M.D.   On: 01/22/2016 13:03      IMPRESSION AND PLAN:   Acute on chronic respiratory failure with hypoxia due to acute on chronic CHF. Continue oxygen by nasal cannula,  NEB when necessary.  Acute on chronic combined systolic and diastolic CHF. Hold torsemide and start Lasix 40 mg IV twice a day, follow CHF protocol. Echocardiogram and cardiology consult.  History of chronic A. Fib, rate controlled, continue Lopressor and Xarelto.  CKD stage 3, stable.  Anemia of chronic disease. Hemoglobin is 8.6, it was 11.3 last months. Stool occult is negative per ED physician. Follow-up hemoglobin.  Hypertension. Continue hypertension medication. Diabetes, start sliding scale.  All the records are reviewed and case discussed with ED provider. Management plans discussed with the patient, his wife and daughter and they are in agreement.  CODE STATUS: DO NOT RESUSCITATE  TOTAL TIME TAKING CARE OF THIS PATIENT: 62 minutes.    Demetrios Loll M.D on 01/22/2016 at 4:39 PM  Between 7am to 6pm - Pager - [phone]  After 6pm go to [email or web address] - Proofreader  [facility]  Office  [phone]  CC: Primary care physician; Maryland Pink, MD   Note: This dictation was prepared with Dragon dictation along with smaller phrase technology. Any transcriptional errors that result from this process are unintentional.

## 2016-01-22 NOTE — ED Provider Notes (Signed)
[facility] Emergency Department Provider Note  ____________________________________________   I have reviewed the triage vital signs and the nursing notes.   HISTORY  Chief Complaint Shortness of Breath    HPI Patrick [name] is a 82 y.o. male with a history of CHF on home oxygen for last several weeks. Over the last 2 days he has had increasing swelling in his legs. He took an extra fluid pill but still has shortness of breath. He states he's gotten to the point where he can't take one 3 steps without stopping. This happened while he was on home oxygen. He has not had any cough or fever. The patient also states that he was having trouble with his transport oxygen on the way here, but is short of breath began before that. He does not have any history of recent GI bleeding or melena. He denies any chest pain and his only symptom is exertional dyspnea and lower extremity swelling     Past Medical History:  Diagnosis Date  . Amputated finger   . Anemia of chronic disease    a. plan of oncology to start Procrit - received during admission 12/2013  . Atypical chest pain    a. 12/2014 Neg CE - in setting of L pleural effusion.  . Bell's palsy   . Chronic combined systolic and diastolic CHF (congestive heart failure) ([place])    a. 11/2012 Echo: EF 60-65%, mod conc LVH, mildly dil LA/RA, mild Ao sclerosis w/o stenosis; b. 11/2014 Echo: EF 40-45%, prob mid-apicalanteroseptal, ant, apical HK, Gr2 DD, mod dil LA, mildly dil RA (technically difficult study).  . CKD (chronic kidney disease), stage III    a. stage III-IV  . Diabetes mellitus without complication ([place])   . GERD (gastroesophageal reflux disease)   . History of gastroesophageal reflux (GERD)   . Hyperlipidemia   . Hypertension   . Permanent atrial fibrillation ([place])    a. Dx 12/2011, Rate-controlled, chronic Xarelto (renal dosing) - CHA2DS2VASc = 5.  . Pleural effusion, left    a. 12/2014 s/p  thoracentesis - protein <3, LDH 123, no malignancy.    Patient Active Problem List   Diagnosis Date Noted  . Hypertensive heart disease 12/21/2015  . Chronic diastolic heart failure ([place]) 12/18/2015  . CAP (community acquired pneumonia) 11/20/2015  . CKD (chronic kidney disease), stage III   . Orthostatic hypotension 08/08/2014  . Anemia of chronic disease   . Chronic renal disease 11/26/2012  . Chronic combined systolic and diastolic CHF (congestive heart failure) ([place]) 11/26/2012  . Persistent atrial fibrillation ([place]) 01/15/2012  . Hypertension 01/15/2012  . Hyperlipidemia 01/15/2012  . Diabetes mellitus ([place]) 01/15/2012    Past Surgical History:  Procedure Laterality Date  . APPENDECTOMY    . COLONOSCOPY  09/2011  . ESOPHAGOGASTRODUODENOSCOPY    . FINGER SURGERY     right hand    Prior to Admission medications   Medication Sig Start Date End Date Taking? Authorizing Provider  aspirin 81 MG tablet Take 81 mg by mouth daily.   Yes Historical Provider, MD  cloNIDine (CATAPRES) 0.1 MG tablet Take 1 tablet (0.1 mg total) by mouth 2 (two) times daily. 09/20/15  Yes Alisa Graff, FNP  enalapril (VASOTEC) 5 MG tablet Take 1 tablet (5 mg total) by mouth daily. 09/20/15  Yes Alisa Graff, FNP  Insulin Lispro Prot & Lispro (HUMALOG MIX 75/25 [place]) Inject 70 Units into the skin 2 (two) times daily. Don't take if under 150  Yes Historical Provider, MD  metoprolol tartrate (LOPRESSOR) 25 MG tablet Take 0.5 tablets (12.5 mg total) by mouth 2 (two) times daily. 01/19/16 04/18/16 Yes Minna Merritts, MD  omeprazole (PRILOSEC) 40 MG capsule Take 1 capsule (40 mg total) by mouth daily. 09/20/15  Yes Alisa Graff, FNP  potassium chloride 20 MEQ TBCR Take 20 mEq by mouth every other day. Patient taking differently: Take 20 mEq by mouth every other day. Tuesday and Thursday if fluid on board 12/22/15  Yes Henreitta Leber, MD  pravastatin (PRAVACHOL) 40 MG tablet Take 2 tablets (80 mg total) by  mouth daily. Take two tablets daily. 09/20/15  Yes Alisa Graff, FNP  Rivaroxaban (XARELTO) 15 MG TABS tablet Take 1 tablet (15 mg total) by mouth daily. 05/05/12  Yes Minna Merritts, MD  torsemide (DEMADEX) 20 MG tablet Take 1 tablet (20 mg total) by mouth every other day. Patient taking differently: Take 20 mg by mouth every other day. As needed on Tues/Thurs for fluid 12/22/15  Yes Henreitta Leber, MD    Allergies Levofloxacin and Prednisone  Family History  Problem Relation Age of Onset  . Heart attack Brother   . Diabetes Mother   . Diabetes Sister   . Hypertension      Social History Social History  Substance Use Topics  . Smoking status: Former Smoker    Packs/day: 0.25    Years: 10.00    Types: Cigars    Quit date: 05/06/1966  . Smokeless tobacco: Never Used  . Alcohol use No    Review of Systems Constitutional: No fever/chills Eyes: No visual changes. ENT: No sore throat. No stiff neck no neck pain Cardiovascular: Denies chest pain. Respiratory: As above shortness of breath. Gastrointestinal:   no vomiting.  No diarrhea.  No constipation. Genitourinary: Negative for dysuria. Musculoskeletal: Positive lower extremity swelling Skin: Negative for rash. Neurological: Negative for severe headaches, focal weakness or numbness. 10-point ROS otherwise negative.  ____________________________________________   PHYSICAL EXAM:  VITAL SIGNS: ED Triage Vitals  Enc Vitals Group     BP 01/22/16 1228 (!) 162/79     Pulse Rate 01/22/16 1228 77     Resp 01/22/16 1228 20     Temp 01/22/16 1228 97.5 F (36.4 C)     Temp Source 01/22/16 1228 Oral     SpO2 01/22/16 1228 98 %     Weight 01/22/16 1229 193 lb (87.5 kg)     Height 01/22/16 1229 5\' 8"  (1.727 m)     Head Circumference --      Peak Flow --      Pain Score 01/22/16 1335 0     Pain Loc --      Pain Edu? --      Excl. in [place]? --     Constitutional: Alert and oriented. Well appearing and in no acute  distress. Eyes: Conjunctivae are normal. PERRL. EOMI. Head: Atraumatic. Nose: No congestion/rhinnorhea. Mouth/Throat: Mucous membranes are moist.  Oropharynx non-erythematous. Neck: No stridor.   Nontender with no meningismus Cardiovascular: Normal rate, regular rhythm. Grossly normal heart sounds.  Good peripheral circulation. Respiratory: Normal respiratory effort.  No retractions. Diffuse baseline Rales noted, minimal. no acute increased work of breathing Abdominal: Soft and nontender. No distention. No guarding no rebound Back:  There is no focal tenderness or step off.  there is no midline tenderness there are no lesions noted. there is no CVA tenderness Musculoskeletal: No lower extremity tenderness, no upper extremity tenderness.  No joint effusions, no DVT signs strong distal pulses 12+ bilateral symmetric pitting edema Neurologic:  Normal speech and language. No gross focal neurologic deficits are appreciated.  Skin:  Skin is warm, dry and intact. No rash noted. Psychiatric: Mood and affect are normal. Speech and behavior are normal.  Rectal exam: Guaiac-negative stool  ____________________________________________   LABS (all labs ordered are listed, but only abnormal results are displayed)  Labs Reviewed  BASIC METABOLIC PANEL - Abnormal; Notable for the following:       Result Value   Chloride 99 (*)    Glucose, Bld 171 (*)    BUN 30 (*)    Creatinine, Ser 1.91 (*)    GFR calc non Af Amer 31 (*)    GFR calc Af Amer 36 (*)    All other components within normal limits  CBC - Abnormal; Notable for the following:    WBC 11.3 (*)    RBC 2.94 (*)    Hemoglobin 8.6 (*)    HCT 26.2 (*)    RDW 16.9 (*)    All other components within normal limits  TROPONIN I - Abnormal; Notable for the following:    Troponin I 0.03 (*)    All other components within normal limits  BRAIN NATRIURETIC PEPTIDE - Abnormal; Notable for the following:    B Natriuretic Peptide 508.0 (*)    All  other components within normal limits   ____________________________________________  EKG  I personally interpreted any EKGs ordered by me or triage Atrial fibrillation rate 62 bpm, no acute ST elevation or depression normal axis ____________________________________________  RADIOLOGY  I reviewed any imaging ordered by me or triage that were performed during my shift and, if possible, patient and/or family made aware of any abnormal findings. ____________________________________________   PROCEDURES  Procedure(s) performed: None  Procedures  Critical Care performed: None  ____________________________________________   INITIAL IMPRESSION / ASSESSMENT AND PLAN / ED COURSE  Pertinent labs & imaging results that were available during my care of the patient were reviewed by me and considered in my medical decision making (see chart for details).  Patient with increasing or showed edema despite supplemental diuresis at home with exertional dyspnea and increased orthopnea. BNP is elevated alone not as elevated as it has been. Oxygen saturation is at rest are better at this time at 2 L. We're giving him Lasix. We do note that his hemoglobin is lower than baseline he has not actively bleeding at this time this certainly could be determining to his shortness of breath. Given his age, failure to respond outpatient diuresis, and exertional symptoms we will admit to the hospital for further evaluation.    Clinical Course   ____________________________________________   FINAL CLINICAL IMPRESSION(S) / ED DIAGNOSES  Final diagnoses:  None      This chart was dictated using voice recognition software.  Despite best efforts to proofread,  errors can occur which can change meaning.      Schuyler Amor, MD 01/22/16 [phone]

## 2016-01-22 NOTE — ED Notes (Signed)
The EKG was completed and signed by Dr. Corky Downs. The EKG was also exported into the system.

## 2016-01-23 DIAGNOSIS — I481 Persistent atrial fibrillation: Secondary | ICD-10-CM

## 2016-01-23 DIAGNOSIS — J9620 Acute and chronic respiratory failure, unspecified whether with hypoxia or hypercapnia: Secondary | ICD-10-CM

## 2016-01-23 DIAGNOSIS — I5032 Chronic diastolic (congestive) heart failure: Secondary | ICD-10-CM

## 2016-01-23 DIAGNOSIS — I110 Hypertensive heart disease with heart failure: Secondary | ICD-10-CM

## 2016-01-23 DIAGNOSIS — J9621 Acute and chronic respiratory failure with hypoxia: Secondary | ICD-10-CM

## 2016-01-23 LAB — BASIC METABOLIC PANEL
Anion gap: 8 (ref 5–15)
BUN: 33 mg/dL — ABNORMAL HIGH (ref 6–20)
CHLORIDE: 100 mmol/L — AB (ref 101–111)
CO2: 32 mmol/L (ref 22–32)
Calcium: 8.7 mg/dL — ABNORMAL LOW (ref 8.9–10.3)
Creatinine, Ser: 1.99 mg/dL — ABNORMAL HIGH (ref 0.61–1.24)
GFR calc non Af Amer: 30 mL/min — ABNORMAL LOW (ref >60)
GFR, EST AFRICAN AMERICAN: 34 mL/min — AB (ref >60)
Glucose, Bld: 75 mg/dL (ref 65–99)
POTASSIUM: 3.2 mmol/L — AB (ref 3.5–5.1)
SODIUM: 140 mmol/L (ref 135–145)

## 2016-01-23 LAB — ECHOCARDIOGRAM COMPLETE
Height: 68 in
Weight: 3037.06 oz

## 2016-01-23 LAB — GLUCOSE, CAPILLARY
GLUCOSE-CAPILLARY: 112 mg/dL — AB (ref 65–99)
Glucose-Capillary: 225 mg/dL — ABNORMAL HIGH (ref 65–99)

## 2016-01-23 MED ORDER — POTASSIUM CHLORIDE CRYS ER 20 MEQ PO TBCR
40.0000 meq | EXTENDED_RELEASE_TABLET | Freq: Two times a day (BID) | ORAL | Status: AC
  Administered 2016-01-23 (×2): 40 meq via ORAL
  Filled 2016-01-23 (×2): qty 2

## 2016-01-23 MED ORDER — POTASSIUM CHLORIDE CRYS ER 10 MEQ PO TBCR
10.0000 meq | EXTENDED_RELEASE_TABLET | Freq: Two times a day (BID) | ORAL | Status: DC
  Administered 2016-01-24: 10 meq via ORAL
  Filled 2016-01-23: qty 1

## 2016-01-23 MED ORDER — FUROSEMIDE 10 MG/ML IJ SOLN: 40.0000 mg | Freq: Every day | INTRAMUSCULAR | Status: DC

## 2016-01-23 MED ORDER — INSULIN ASPART PROT & ASPART (70-30 MIX) 100 UNIT/ML ~~LOC~~ SUSP
50.0000 [IU] | Freq: Two times a day (BID) | SUBCUTANEOUS | Status: DC
  Administered 2016-01-23: 50 [IU] via SUBCUTANEOUS
  Filled 2016-01-23: qty 50

## 2016-01-23 NOTE — Care Management (Addendum)
Patient presents to the ED with sudden onset of shortness of breath and low 02 sats.  He has been compliant with heart Failure clinic visits and appears there has been close communication between home health agency and cardiology office.  Followed by [facility].  Physical therapy recently closed.  Chronic 02 through [facility].  Have reached out to [facility] to determine if patient is still being actively followed. Notified [facility] of readmission.  Recent discharge from [facility] 9/29 for similar symtoms

## 2016-01-23 NOTE — Consult Note (Signed)
   [facility] CM Inpatient Consult   01/23/2016  MAEJOR ERVEN May 27, 1931 [id number]   Patient is currently active with [facility] for chronic disease management services.  Patient has been engaged by a telephonic RN case Freight forwarder.  Our community based plan of care has focused on disease management and community resource support.  Patient will receive a post discharge transition of care call and will be evaluated for monthly home visits for assessments and disease process education.  Made Inpatient Case Manager aware that [facility] following. Of note, [facility] services does not replace or interfere with any services that are needed or arranged by inpatient case management or social work.  For additional questions or referrals please contact:  Sharren Schnurr RN, [facility] Liaison  [phone]) [place] [phone]) Toll free office

## 2016-01-23 NOTE — Progress Notes (Addendum)
[facility] [place] NAME: Patrick [name]    MR#:  [id number]  DATE OF BIRTH:  Jul 26, 1931  SUBJECTIVE:  Came in with increasing SOB and found to have CHF  REVIEW OF SYSTEMS:   Review of Systems  Constitutional: Negative for chills, fever and weight loss.  HENT: Negative for ear discharge, ear pain and nosebleeds.   Eyes: Negative for blurred vision, pain and discharge.  Respiratory: Positive for shortness of breath. Negative for sputum production, wheezing and stridor.   Cardiovascular: Positive for leg swelling. Negative for chest pain, palpitations, orthopnea and PND.  Gastrointestinal: Negative for abdominal pain, diarrhea, nausea and vomiting.  Genitourinary: Negative for frequency and urgency.  Musculoskeletal: Negative for back pain and joint pain.  Neurological: Negative for sensory change, speech change, focal weakness and weakness.  Psychiatric/Behavioral: Negative for depression and hallucinations. The patient is not nervous/anxious.    Tolerating Diet:yes Tolerating PT: ambulatory  DRUG ALLERGIES:   Allergies  Allergen Reactions  . Levofloxacin Other (See Comments)    confusion  . Prednisone Other (See Comments)    confusion    VITALS:  Blood pressure (!) 153/72, pulse 66, temperature 98.6 F (37 C), temperature source Oral, resp. rate 16, height 5\' 8"  (1.727 m), weight 84.1 kg (185 lb 6.5 oz), SpO2 98 %.  PHYSICAL EXAMINATION:   Physical Exam  GENERAL:  82 y.o.-year-old patient lying in the bed with no acute distress.  EYES: Pupils equal, round, reactive to light and accommodation. No scleral icterus. Extraocular muscles intact.  HEENT: Head atraumatic, normocephalic. Oropharynx and nasopharynx clear.  NECK:  Supple, no jugular venous distention. No thyroid enlargement, no tenderness.  LUNGS: decreased breath sounds bilaterally, no wheezing, rales, rhonchi. No use of accessory muscles of respiration.   CARDIOVASCULAR: S1, S2 normal. No murmurs, rubs, or gallops.  ABDOMEN: Soft, nontender, nondistended. Bowel sounds present. No organomegaly or mass.  EXTREMITIES: No cyanosis, clubbing or edema b/l.    NEUROLOGIC: Cranial nerves II through XII are intact. No focal Motor or sensory deficits b/l.   PSYCHIATRIC:  patient is alert and oriented x 3.  SKIN: No obvious rash, lesion, or ulcer.   LABORATORY PANEL:  CBC  Recent Labs Lab 01/22/16 1239  WBC 11.3*  HGB 8.6*  HCT 26.2*  PLT 279    Chemistries   Recent Labs Lab 01/22/16 1700 01/23/16 0410  NA  --  140  K  --  3.2*  CL  --  100*  CO2  --  32  GLUCOSE  --  75  BUN  --  33*  CREATININE  --  1.99*  CALCIUM  --  8.7*  MG 2.1  --    Cardiac Enzymes  Recent Labs Lab 01/22/16 1239  TROPONINI 0.03*   RADIOLOGY:  Dg Chest 2 View  Result Date: 01/22/2016 CLINICAL DATA:  Shortness of breath, low oxygen saturation, nonproductive cough last night EXAM: CHEST  2 VIEW COMPARISON:  12/13/2015 FINDINGS: Enlargement of cardiac silhouette with slight pulmonary vascular congestion. Atherosclerotic calcification aorta. BILATERAL pulmonary infiltrates favoring pulmonary edema. Associated small pleural effusions. No pneumothorax. Bones demineralized. IMPRESSION: Probable CHF with small bibasilar effusions. Electronically Signed   By: Lavonia Dana M.D.   On: 01/22/2016 13:03   ASSESSMENT AND PLAN:  Patrick [name]  is a 82 y.o. male with a known history of Chronic CHF, hypertension, hyperlipidemia and A. fib. The patient presents in the ED with cough, shortness of breath and generalized weakness  for a couple days. In addition, he complains of orthopnea, nocturnal dyspnea and leg edema. He has dyspnea on exertion with a few steps for the past few days. Chest x-ray show pulmonary edema with mild pleural effusion  1.Acute on chronic respiratory failure with hypoxia due to acute on chronic CHF. Continue oxygen by nasal cannula, NEB  when necessary. -pt diuresing well   2.Acute on chronic combined systolic and diastolic CHF. Hold torsemide andcont Lasix 40 mg IV twice a day, follow CHF protocol. Echocardiogram reviewed - cardiology consult appreciated  3.History of chronic A. Fib, rate controlled, continue Lopressor and Xarelto.  4.CKD stage 3, stable. Creat 1.5-2.0 Creat today 2.1  5.Anemia of chronic disease. Hemoglobin is 8.6, it was 11.3 last months.  -Stool occult is negative per ED physician.   6.Hypertension. Continue hypertension medication.  7.Diabetes Cont Insulin 70/30 50 unit bid  Case discussed with Care Management/Social Worker. Management plans discussed with the patient, family and they are in agreement.  CODE STATUS:DNR DVT Prophylaxis: xarelto  TOTAL TIME TAKING CARE OF THIS PATIENT: 30 minutes.  >50% time spent on counselling and coordination of care  POSSIBLE D/C IN 2-3 DAYS, DEPENDING ON CLINICAL CONDITION.  Note: This dictation was prepared with Dragon dictation along with smaller phrase technology. Any transcriptional errors that result from this process are unintentional.  China Deitrick M.D on 01/23/2016 at 11:34 AM  Between 7am to 6pm - Pager - [phone]  After 6pm go to [email or web address] - password [email or web address] [facility]  [place] [facility]  Office  [phone]  CC: Primary care physician; [place] Pink, MD

## 2016-01-23 NOTE — Consult Note (Signed)
Cardiology Consultation Note  Patient ID: Patrick [name], MRN: [id number], DOB/AGE: [birth year] 82 y.o. Admit date: 01/22/2016   Date of Consult: 01/23/2016 Primary Physician: Maryland Pink, MD Primary Cardiologist: Dr. [name], MD Requesting Physician: Dr. Bridgett Larsson, MD  Chief Complaint: SOB x 1 day Reason for Consult: Same  HPI: 82 y.o. male with h/o chronic combined CHF, persistent Afib on Xarelto, CKD stage III-IV, left transudative pleural effusion s/p thoracentesis 12/2014, recent PNA, anemia of chronic disease, and hypertensive heart disease who presented to [facility] with a 1 day history of increased SOB.   He was recently admitted in late September with acute on chronic diastolic CHF. Echo at that time showed normal EF with PASP 59 mmHg. He was diuresed and sent home on torsemide every other day. Weight has been stable, no LE swelling, and stable 2-pillow orthopnea.    He was in his usual state of health until 10/30 when he developed sudden onset of SOB. No early satiety. Has been taking all medications as directed without salt intake. Limits PO fluids to less than 2 L daily. No BRBPR, melena, hematemesis, or hematuria. Has not felt palpitations.   Upon the patient's arrival to [facility] they were found to have improved BNP from one month prior at 508 (prior 738), SCr 1.91-->1.99, BUN 30-->33, K+ 4.8-->3.2, hgb 11.3-->8.6, wbc 9.0-->11.3, troponin 0.03 and not rechecked, Mg++ 2.1. ECG as below, CXR showed possible CHF with small bibasilar effusions. Breathing better this morning. Has UOP of 2.4 L for the admission with 1.6 L overnight.    Past Medical History:  Diagnosis Date  . Amputated finger   . Anemia of chronic disease    a. plan of oncology to start Procrit - received during admission 12/2013  . Atypical chest pain    a. 12/2014 Neg CE - in setting of L pleural effusion.  . Bell's palsy   . Chronic combined systolic and diastolic CHF (congestive heart failure) ([place])    a. 11/2012  Echo: EF 60-65%, mod conc LVH, mildly dil LA/RA, mild Ao sclerosis w/o stenosis; b. 11/2014 Echo: EF 40-45%, prob mid-apicalanteroseptal, ant, apical HK, Gr2 DD, mod dil LA, mildly dil RA (technically difficult study).  . CKD (chronic kidney disease), stage III    a. stage III-IV  . Diabetes mellitus without complication ([place])   . GERD (gastroesophageal reflux disease)   . History of gastroesophageal reflux (GERD)   . Hyperlipidemia   . Hypertension   . Permanent atrial fibrillation ([place])    a. Dx 12/2011, Rate-controlled, chronic Xarelto (renal dosing) - CHA2DS2VASc = 5.  . Pleural effusion, left    a. 12/2014 s/p thoracentesis - protein <3, LDH 123, no malignancy.      Most Recent Cardiac Studies: Echo 12/21/2015: Study Conclusions  - Left ventricle: The cavity size was normal. There was mild   concentric hypertrophy. Systolic function was normal. The   estimated ejection fraction was in the range of 60% to 65%. Wall   motion was normal; there were no regional wall motion   abnormalities. The study is not technically sufficient to allow   evaluation of LV diastolic function. - Aortic valve: There was trivial regurgitation. - Left atrium: The atrium was mildly dilated. - Right ventricle: Systolic function was normal. - Pulmonary arteries: Systolic pressure was moderately elevated PA   peak pressure: 59 mm Hg (S).  Impressions:  - Rhythm is atrial fibrillation.   Surgical History:  Past Surgical History:  Procedure Laterality Date  . APPENDECTOMY    .  COLONOSCOPY  09/2011  . ESOPHAGOGASTRODUODENOSCOPY    . FINGER SURGERY     right hand     Home Meds: Prior to Admission medications   Medication Sig Start Date End Date Taking? Authorizing Provider  aspirin 81 MG tablet Take 81 mg by mouth daily.   Yes Historical Provider, MD  cloNIDine (CATAPRES) 0.1 MG tablet Take 1 tablet (0.1 mg total) by mouth 2 (two) times daily. 09/20/15  Yes Alisa Graff, FNP  enalapril  (VASOTEC) 5 MG tablet Take 1 tablet (5 mg total) by mouth daily. 09/20/15  Yes Alisa Graff, FNP  Insulin Lispro Prot & Lispro (HUMALOG MIX 75/25 [place]) Inject 70 Units into the skin 2 (two) times daily. Don't take if under 150   Yes Historical Provider, MD  metoprolol tartrate (LOPRESSOR) 25 MG tablet Take 0.5 tablets (12.5 mg total) by mouth 2 (two) times daily. 01/19/16 04/18/16 Yes Minna Merritts, MD  omeprazole (PRILOSEC) 40 MG capsule Take 1 capsule (40 mg total) by mouth daily. 09/20/15  Yes Alisa Graff, FNP  potassium chloride 20 MEQ TBCR Take 20 mEq by mouth every other day. Patient taking differently: Take 20 mEq by mouth every other day. Tuesday and Thursday if fluid on board 12/22/15  Yes Henreitta Leber, MD  pravastatin (PRAVACHOL) 40 MG tablet Take 2 tablets (80 mg total) by mouth daily. Take two tablets daily. 09/20/15  Yes Alisa Graff, FNP  Rivaroxaban (XARELTO) 15 MG TABS tablet Take 1 tablet (15 mg total) by mouth daily. 05/05/12  Yes Minna Merritts, MD  torsemide (DEMADEX) 20 MG tablet Take 1 tablet (20 mg total) by mouth every other day. Patient taking differently: Take 20 mg by mouth every other day. As needed on Tues/Thurs for fluid 12/22/15  Yes Henreitta Leber, MD    Inpatient Medications:  . aspirin EC  81 mg Oral Daily  . cloNIDine  0.1 mg Oral BID  . enalapril  5 mg Oral Daily  . furosemide  40 mg Intravenous Q12H  . insulin aspart protamine- aspart  70 Units Subcutaneous BID WC  . metoprolol tartrate  12.5 mg Oral BID  . pantoprazole  40 mg Oral Daily  . [START ON 01/24/2016] potassium chloride  10 mEq Oral BID  . potassium chloride  40 mEq Oral BID  . pravastatin  80 mg Oral Daily  . Rivaroxaban  15 mg Oral Daily  . sodium chloride flush  3 mL Intravenous Q12H      Allergies:  Allergies  Allergen Reactions  . Levofloxacin Other (See Comments)    confusion  . Prednisone Other (See Comments)    confusion    Social History   Social History  . Marital  status: Married    Spouse name: N/A  . Number of children: N/A  . Years of education: N/A   Occupational History  . Not on file.   Social History Main Topics  . Smoking status: Former Smoker    Packs/day: 0.25    Years: 10.00    Types: Cigars    Quit date: 05/06/1966  . Smokeless tobacco: Never Used  . Alcohol use No  . Drug use: No  . Sexual activity: Not on file   Other Topics Concern  . Not on file   Social History Narrative   Lives in [place] with his wife.     Family History  Problem Relation Age of Onset  . Heart attack Brother   . Diabetes Mother   .  Diabetes Sister   . Hypertension       Review of Systems: Review of Systems  Constitutional: Positive for malaise/fatigue. Negative for chills, diaphoresis, fever and weight loss.  HENT: Negative for congestion.   Eyes: Negative for discharge and redness.  Respiratory: Positive for cough and shortness of breath. Negative for hemoptysis, sputum production and wheezing.   Cardiovascular: Positive for orthopnea. Negative for chest pain, palpitations, claudication, leg swelling and PND.  Gastrointestinal: Negative for abdominal pain, blood in stool, heartburn, melena, nausea and vomiting.  Genitourinary: Negative for hematuria.  Musculoskeletal: Negative for falls and myalgias.  Skin: Negative for rash.  Neurological: Positive for weakness. Negative for dizziness, tingling, tremors, sensory change, speech change, focal weakness and loss of consciousness.  Endo/Heme/Allergies: Does not bruise/bleed easily.  Psychiatric/Behavioral: Negative for substance abuse. The patient is not nervous/anxious.   All other systems reviewed and are negative.   Labs:  Recent Labs  01/22/16 1239  TROPONINI 0.03*   Lab Results  Component Value Date   WBC 11.3 (H) 01/22/2016   HGB 8.6 (L) 01/22/2016   HCT 26.2 (L) 01/22/2016   MCV 89.1 01/22/2016   PLT 279 01/22/2016     Recent Labs Lab 01/23/16 0410  NA 140  K 3.2*    CL 100*  CO2 32  BUN 33*  CREATININE 1.99*  CALCIUM 8.7*  GLUCOSE 75   Lab Results  Component Value Date   CHOL 81 01/03/2014   HDL 16 (L) 01/03/2014   LDLCALC 43 01/03/2014   TRIG 111 01/03/2014   No results found for: DDIMER  Radiology/Studies:  Dg Chest 2 View  Result Date: 01/22/2016 CLINICAL DATA:  Shortness of breath, low oxygen saturation, nonproductive cough last night EXAM: CHEST  2 VIEW COMPARISON:  12/13/2015 FINDINGS: Enlargement of cardiac silhouette with slight pulmonary vascular congestion. Atherosclerotic calcification aorta. BILATERAL pulmonary infiltrates favoring pulmonary edema. Associated small pleural effusions. No pneumothorax. Bones demineralized. IMPRESSION: Probable CHF with small bibasilar effusions. Electronically Signed   By: Lavonia Dana M.D.   On: 01/22/2016 13:03    EKG: Interpreted by me showed: Afib, 62 bpm, baseline wandering, low-voltage pre-cordial leads, nonspecific st/t change Telemetry: Interpreted by me showed: Afib, 60's bpm  Weights: Filed Weights   01/22/16 1229 01/22/16 1618 01/23/16 0643  Weight: 193 lb (87.5 kg) 189 lb 13.1 oz (86.1 kg) 185 lb 6.5 oz (84.1 kg)     Physical Exam: Blood pressure (!) 150/70, pulse 69, temperature 98.2 F (36.8 C), temperature source Oral, resp. rate 18, height 5\' 8"  (1.727 m), weight 185 lb 6.5 oz (84.1 kg), SpO2 91 %. Body mass index is 28.19 kg/m. General: Well developed, well nourished, in no acute distress. Head: Normocephalic, atraumatic, sclera non-icteric, no xanthomas, nares are without discharge.  Neck: Negative for carotid bruits. JVD not elevated. Lungs: Decreased breath sounds bilateral bases, no frank crackles. Breathing is unlabored. Heart: Irregularly irregular with S1 S2. No murmurs, rubs, or gallops appreciated. Abdomen: Soft, non-tender, non-distended with normoactive bowel sounds. No hepatomegaly. No rebound/guarding. No obvious abdominal masses. Msk:  Strength and tone appear  normal for age. Extremities: No clubbing or cyanosis. No edema. Distal pedal pulses are 2+ and equal bilaterally. Neuro: Alert and oriented X 3. No facial asymmetry. No focal deficit. Moves all extremities spontaneously. Psych:  Responds to questions appropriately with a normal affect.    Assessment and Plan:  Principal Problem:   Acute on chronic respiratory failure (HCC) Active Problems:   Anemia of chronic disease  CKD (chronic kidney disease), stage III   Chronic diastolic heart failure (HCC)   Hypertensive heart disease   Persistent atrial fibrillation (HCC)   Diabetes mellitus (HCC)   Hyperlipidemia    1. Acute on chronic hypoxic respiratory failure: -Likely multifactorial including some component of acute on chronic diastolic CHF, and worsening anemia with a nearly 3 gram drop in hgb overnight, and possible pulmonary component -Wean back to baseline oxygen as able -Recommend IM to evaluate anemia as well as possible pulmonary component. Consider CT chest to evaluate lungs -Cannot rule out ischemia playing a role in the above, though has had a negative troponin and ruled out -Could consider nuclear stress testing when acute illness is improved -Would not proceed directly to cardiac cath given his renal function as well as possible ongoing bleed at this time  2. Acute on chronic diastolic CHF/pulmonary hypertension: -Breathing better this morning than the day prior -Continue IV Lasix for now with KCl repletion -Would need further IV diuresis given echo appears to show evidence of volume overload -Once he is diuresed he would likely benefit from at least a RHC (may want to avoid LHC given renal function) to further evaluate right heart pressures -Recommend VQ scan based on his persistent moderate to severe pulmonary hypertension to evaluate for chronic PE (VQ is the test of choice for chronic PE and his renal function limit CTA chest at this time), defer this imaging to  IM -Decrease dosing to once daily given slight bump in renal function this morning -Echo pending this admission. Unless EF is significantly reduced would hold on inpatient ischemic workup as above  3. Acute on chronic anemia: -Denies any bleeding issues -Defer work up to IM -Maintain HGB > 8.5 -Consider iron, defer to IM  4. CKD stage III-IV: -Monitor closely  5. Persistent Afib: -Currently in rate-controlled Afib -Continue low-dose Lopressor 12.5 mg bid -Continue dose adjusted Xarelto 15 mg q dinner given CrCl of 33.97 mL/min from bmet on 10/31 -Would stop ASA as there is no indication for dual therapy in this patient at this time  6. Hypertensive heart disease: -Modestly controlled -Titrate medications as/if needed  7. HLD: -Continue statin    Signed, Marcille Blanco [facility] Pager: [phone] 01/23/2016, 8:47 AM

## 2016-01-23 NOTE — Plan of Care (Signed)
Problem: Physical Regulation: Goal: Ability to maintain clinical measurements within normal limits will improve Outcome: Progressing VSS.  Reports breathing better.  Voiding without difficulty.

## 2016-01-24 ENCOUNTER — Inpatient Hospital Stay: Payer: Commercial Managed Care - HMO

## 2016-01-24 DIAGNOSIS — N183 Chronic kidney disease, stage 3 (moderate): Secondary | ICD-10-CM

## 2016-01-24 DIAGNOSIS — I272 Pulmonary hypertension, unspecified: Secondary | ICD-10-CM

## 2016-01-24 LAB — BASIC METABOLIC PANEL
ANION GAP: 8 (ref 5–15)
BUN: 39 mg/dL — ABNORMAL HIGH (ref 6–20)
CALCIUM: 8.8 mg/dL — AB (ref 8.9–10.3)
CHLORIDE: 102 mmol/L (ref 101–111)
CO2: 32 mmol/L (ref 22–32)
CREATININE: 2.16 mg/dL — AB (ref 0.61–1.24)
GFR calc non Af Amer: 27 mL/min — ABNORMAL LOW (ref >60)
GFR, EST AFRICAN AMERICAN: 31 mL/min — AB (ref >60)
GLUCOSE: 59 mg/dL — AB (ref 65–99)
Potassium: 3.5 mmol/L (ref 3.5–5.1)
Sodium: 142 mmol/L (ref 135–145)

## 2016-01-24 LAB — GLUCOSE, CAPILLARY: Glucose-Capillary: 74 mg/dL (ref 65–99)

## 2016-01-24 MED ORDER — ASPIRIN 81 MG PO CHEW
81.0000 mg | CHEWABLE_TABLET | Freq: Every day | ORAL | Status: DC
  Administered 2016-01-24: 81 mg via ORAL
  Filled 2016-01-24: qty 1

## 2016-01-24 MED ORDER — CLONIDINE HCL 0.1 MG PO TABS
0.1000 mg | ORAL_TABLET | Freq: Every day | ORAL | Status: DC
  Administered 2016-01-24: 0.1 mg via ORAL
  Filled 2016-01-24: qty 1

## 2016-01-24 MED ORDER — INSULIN LISPRO PROT & LISPRO (75-25 MIX) 100 UNIT/ML ~~LOC~~ SUSP: 50.0000 [IU] | Freq: Two times a day (BID) | SUBCUTANEOUS | 11 refills | Status: DC

## 2016-01-24 MED ORDER — CLONIDINE HCL 0.1 MG PO TABS: 0.1000 mg | ORAL_TABLET | Freq: Every day | ORAL | 3 refills | Status: DC

## 2016-01-24 NOTE — Progress Notes (Signed)
Patient: Patrick [name] / Admit Date: 01/22/2016 / Date of Encounter: 01/24/2016, 7:55 AM   Subjective: No acute overnight events. Breathing feels like it is at his baseline at rest. Now back to 2 L via nasal cannula (his baseline). Renal function slightly worse this morning at 2.16. Echo showed worsening right-sided pressure at 70 mmHg.   Review of Systems: Review of Systems  Constitutional: Positive for malaise/fatigue. Negative for chills, diaphoresis, fever and weight loss.  HENT: Negative for congestion.   Eyes: Negative for discharge and redness.  Respiratory: Positive for cough, shortness of breath and wheezing. Negative for hemoptysis and sputum production.   Cardiovascular: Positive for orthopnea. Negative for chest pain, palpitations, claudication, leg swelling and PND.  Gastrointestinal: Negative for abdominal pain, blood in stool, heartburn, melena, nausea and vomiting.  Genitourinary: Negative for hematuria.  Musculoskeletal: Negative for falls and myalgias.  Skin: Negative for rash.  Neurological: Positive for weakness. Negative for dizziness, tingling, tremors, sensory change, speech change, focal weakness and loss of consciousness.  Endo/Heme/Allergies: Does not bruise/bleed easily.  Psychiatric/Behavioral: Negative for substance abuse. The patient is not nervous/anxious.   All other systems reviewed and are negative.   Objective: Telemetry: Afib with rates into the 30's bpm, currently in the 60's bpm Physical Exam: Blood pressure 134/66, pulse 62, temperature 97.8 F (36.6 C), temperature source Oral, resp. rate 16, height _0  (1.727 m), weight 184 lb (83.5 kg), SpO2 98 %. Body mass index is 27.98 kg/m. General: Well developed, well nourished, in no acute distress. Head: Normocephalic, atraumatic, sclera non-icteric, no xanthomas, nares are without discharge. Neck: Negative for carotid bruits. JVP not elevated. Lungs: Bilateral crackles persist.  Breathing is unlabored on 2 L via nasal cannula.  Heart: Irregularly irregular S1 S2 without murmurs, rubs, or gallops.  Abdomen: Soft, non-tender, non-distended with normoactive bowel sounds. No rebound/guarding. Extremities: No clubbing or cyanosis. No edema. Distal pedal pulses are 2+ and equal bilaterally. Neuro: Alert and oriented X 3. Moves all extremities spontaneously. Psych:  Responds to questions appropriately with a normal affect.   Intake/Output Summary (Last 24 hours) at 01/24/16 0755 Last data filed at 01/24/16 0750  Gross per 24 hour  Intake              720 ml  Output              975 ml  Net             -255 ml    Inpatient Medications:  . cloNIDine  0.1 mg Oral BID  . enalapril  5 mg Oral Daily  . insulin aspart protamine- aspart  50 Units Subcutaneous BID WC  . pantoprazole  40 mg Oral Daily  . potassium chloride  10 mEq Oral BID  . pravastatin  80 mg Oral Daily  . Rivaroxaban  15 mg Oral Daily  . sodium chloride flush  3 mL Intravenous Q12H   Infusions:    Labs:  Recent Labs  01/22/16 1700 01/23/16 0410 01/24/16 0431  NA  --  140 142  K  --  3.2* 3.5  CL  --  100* 102  CO2  --  32 32  GLUCOSE  --  75 59*  BUN  --  33* 39*  CREATININE  --  1.99* 2.16*  CALCIUM  --  8.7* 8.8*  MG 2.1  --   --    No results for input(s): AST, ALT, ALKPHOS, BILITOT, PROT, ALBUMIN in the last 72  hours.  Recent Labs  01/22/16 1239  WBC 11.3*  HGB 8.6*  HCT 26.2*  MCV 89.1  PLT 279    Recent Labs  01/22/16 1239  TROPONINI 0.03*   Invalid input(s): POCBNP No results for input(s): HGBA1C in the last 72 hours.   Weights: Filed Weights   01/22/16 1618 01/23/16 0643 01/24/16 0429  Weight: 189 lb 13.1 oz (86.1 kg) 185 lb 6.5 oz (84.1 kg) 184 lb (83.5 kg)     Radiology/Studies:  Dg Chest 2 View  Result Date: 01/22/2016 CLINICAL DATA:  Shortness of breath, low oxygen saturation, nonproductive cough last night EXAM: CHEST  2 VIEW COMPARISON:  12/13/2015  FINDINGS: Enlargement of cardiac silhouette with slight pulmonary vascular congestion. Atherosclerotic calcification aorta. BILATERAL pulmonary infiltrates favoring pulmonary edema. Associated small pleural effusions. No pneumothorax. Bones demineralized. IMPRESSION: Probable CHF with small bibasilar effusions. Electronically Signed   By: Lavonia Dana M.D.   On: 01/22/2016 13:03     Assessment and Plan  Principal Problem:   Acute on chronic respiratory failure (HCC) Active Problems:   Anemia of chronic disease   CKD (chronic kidney disease), stage III   Chronic diastolic heart failure (HCC)   Hypertensive heart disease   Persistent atrial fibrillation (HCC)   Diabetes mellitus ([place])   Hyperlipidemia    1. Acute on chronic hypoxic respiratory failure: -Back to baseline 2 L oxygen via nasal cannula -Likely multifactorial including some component of acute on chronic diastolic CHF, anemia (no CBC today), and possible pulmonary component  -Continue to recommend further evaluation of his pulmonary function with evaluation of chronic PE with VQ scan (though less likely given he is on Eliquis), vs interstitial lung disease -Cannot rule out amyloidosis given his echo findings -Recommend SPEP and UPEP to exclude multiple myeloma   2. Pulmonary hypertension: -He no longer appears volume overloaded at this time, though his lung exam continues to reveal crackles -Recommend evaluation as above per IM -Lasix has been held this morning 2/2 renal function -Echo as above -Ultimately, he may require a RHC and possibly a LHC pending renal function  3. Acute on chronic anemia: -No CBC today -Defer workup and treatment to IM  4. CKD stage III-IV: -Renal function worse with diuresis -Lasix held as above -Hold enalapril given worsening renal function -Need to trend renal function off Lasix and assess what his home regimen will be   5. Persistent Afib: -Rate controlled with slow ventricular response  at times -Hold metoprolol today -Consider weaning off clonidine as well in the future -Continue dose adjusted Xarelto 15 mg q dinner given his CrCl of 31.13 mL/min based on bmet from today -ASA has been stopped at time of consult as there is no indication for dual therapy -CHADS2VASc at least 5 (CHF, HTN, age x 2, DM)  6. Hypokalemia: -Continue repletion today  7. Hypertensive heart disease: -Controlled -Continue current therapy as above -With holding metoprolol and enalapril as above may need to add further antihypertensives if BP trends up  8. HLD: -Statin    Signed, Christell Faith, PA-C [facility] Pager: [phone] 01/24/2016, 7:55 AM

## 2016-01-24 NOTE — Progress Notes (Signed)
Inpatient Diabetes Program Recommendations  AACE/ADA: New Consensus Statement on Inpatient Glycemic Control (2015)  Target Ranges:  Prepandial:   less than 140 mg/dL      Peak postprandial:   less than 180 mg/dL (1-2 hours)      Critically ill patients:  140 - 180 mg/dL   Lab Results  Component Value Date   GLUCAP 74 01/24/2016   HGBA1C 6.3 (H) 11/21/2015    Review of Glycemic ControlResults for RICAHRD, SCHWAGER (MRN [id number]) as of 01/24/2016 10:34  Ref. Range 01/23/2016 09:03 01/23/2016 16:23 01/24/2016 07:49  Glucose-Capillary Latest Ref Range: 65 - 99 mg/dL 112 (H) 225 (H) 74   Diabetes history: Type 2 diabetes Outpatient Diabetes medications: Humalog 75/25 70 units bid Current orders for Inpatient glycemic control:  Novolog 70/30 50 units bid  Inpatient Diabetes Program Recommendations:   Please consider further reduction of Novolog 70/30 40 units bid.  Thanks, Adah Perl, RN, BC-ADM Inpatient Diabetes Coordinator Pager [phone] (8a-5p)

## 2016-01-24 NOTE — Discharge Summary (Addendum)
[facility] [place] NAME: Patrick [name]    MR#:  [id number]  DATE OF BIRTH:  04/25/1931  DATE OF ADMISSION:  01/22/2016 ADMITTING PHYSICIAN: Demetrios Loll, MD  DATE OF DISCHARGE: 01/24/16  PRIMARY CARE PHYSICIAN: Maryland Pink, MD    ADMISSION DIAGNOSIS:  SOB (shortness of breath) [R06.02] Hypoxia [R09.02] Acute on chronic congestive heart failure, unspecified congestive heart failure type ([place]) [I50.9] Dyspnea, unspecified type [R06.00]  DISCHARGE DIAGNOSIS:  Acute on Chronic CHF diastolic CKD-3 Incidental right pleural nodule (noted on CT chest) Afib on on anticoagulation Dm-2 SECONDARY DIAGNOSIS:   Past Medical History:  Diagnosis Date  . Amputated finger   . Anemia of chronic disease    a. plan of oncology to start Procrit - received during admission 12/2013  . Atypical chest pain    a. 12/2014 Neg CE - in setting of L pleural effusion.  . Bell's palsy   . Chronic combined systolic and diastolic CHF (congestive heart failure) ([place])    a. 11/2012 Echo: EF 60-65%, mod conc LVH, mildly dil LA/RA, mild Ao sclerosis w/o stenosis; b. 11/2014 Echo: EF 40-45%, prob mid-apicalanteroseptal, ant, apical HK, Gr2 DD, mod dil LA, mildly dil RA (technically difficult study).  . CKD (chronic kidney disease), stage III    a. stage III-IV  . Diabetes mellitus without complication ([place])   . GERD (gastroesophageal reflux disease)   . History of gastroesophageal reflux (GERD)   . Hyperlipidemia   . Hypertension   . Permanent atrial fibrillation ([place])    a. Dx 12/2011, Rate-controlled, chronic Xarelto (renal dosing) - CHA2DS2VASc = 5.  . Pleural effusion, left    a. 12/2014 s/p thoracentesis - protein <3, LDH 123, no malignancy.    HOSPITAL COURSE:   [place] a 82 y.o.malewith a known history of Chronic CHF, hypertension, hyperlipidemia and A. fib. The patient presents in the ED with cough, shortness of breath and  generalized weakness for a couple days. In addition, he complains of orthopnea, nocturnal dyspnea and leg edema. He has dyspnea on exertion with a few steps for the past few days. Chest x-ray show pulmonary edema with mild pleural effusion  1.Acute on chronic respiratory failure with hypoxia due to acute on chronic CHF. Continue oxygen by nasal cannula, NEB when necessary. -pt diuresing well -change to po torsemide qod -CT chest showed chronic emphysematous changes , known lymphadenopathy and new right pleural nodule which I discussed with wife can be followed up with PCP Dr Kary Kos as out pt -pt may need PET CT scan -incidental thyroid nodule on CT chest-f/u PCP  2.Acute on chronic combined systolic and diastolic CHF. Hold torsemide andcont Lasix 40 mg IV twice a day, follow CHF protocol. Echocardiogram reviewed - cardiology consult appreciated  3.History of chronic A. Fib, rate controlled, continue Lopressor and Xarelto.  4.CKD stage 3, stable. Creat 1.5-2.0 Creat today 2.1 -hold enalapril due to rise in creat. Cardiology can resume if creat is stable as outpt  5.Anemia of chronic disease. Hemoglobin is 8.6,it was 11.3 last months.  -Stool occult is negative per ED physician.   6.Hypertension. Continue hypertension medication.  7.Diabetes Cont Insulin 70/30 50 unit bid  Overall stable d/c home  CONSULTS OBTAINED:  Treatment Team:  Minna Merritts, MD  DRUG ALLERGIES:   Allergies  Allergen Reactions  . Levofloxacin Other (See Comments)    confusion  . Prednisone Other (See Comments)    confusion    DISCHARGE MEDICATIONS:  Current Discharge Medication List    CONTINUE these medications which have CHANGED   Details  cloNIDine (CATAPRES) 0.1 MG tablet Take 1 tablet (0.1 mg total) by mouth daily. Qty: 180 tablet, Refills: 3    insulin lispro protamine-lispro (HUMALOG MIX 75/25) (75-25) 100 UNIT/ML SUSP injection Inject 50 Units into the skin 2 (two)  times daily. Don't take if under 150 Qty: 10 mL, Refills: 11      CONTINUE these medications which have NOT CHANGED   Details  aspirin 81 MG tablet Take 81 mg by mouth daily.    omeprazole (PRILOSEC) 40 MG capsule Take 1 capsule (40 mg total) by mouth daily. Qty: 90 capsule, Refills: 3    potassium chloride 20 MEQ TBCR Take 20 mEq by mouth every other day. Qty: 30 tablet, Refills: 1    pravastatin (PRAVACHOL) 40 MG tablet Take 2 tablets (80 mg total) by mouth daily. Take two tablets daily. Qty: 180 tablet, Refills: 3    Rivaroxaban (XARELTO) 15 MG TABS tablet Take 1 tablet (15 mg total) by mouth daily. Qty: 30 tablet, Refills: 3    torsemide (DEMADEX) 20 MG tablet Take 1 tablet (20 mg total) by mouth every other day. Qty: 60 tablet, Refills: 1      STOP taking these medications     enalapril (VASOTEC) 5 MG tablet      metoprolol tartrate (LOPRESSOR) 25 MG tablet         If you experience worsening of your admission symptoms, develop shortness of breath, life threatening emergency, suicidal or homicidal thoughts you must seek medical attention immediately by calling 911 or calling your MD immediately  if symptoms less severe.  You Must read complete instructions/literature along with all the possible adverse reactions/side effects for all the Medicines you take and that have been prescribed to you. Take any new Medicines after you have completely understood and accept all the possible adverse reactions/side effects.   Please note  You were cared for by a hospitalist during your hospital stay. If you have any questions about your discharge medications or the care you received while you were in the hospital after you are discharged, you can call the unit and asked to speak with the hospitalist on call if the hospitalist that took care of you is not available. Once you are discharged, your primary care physician will handle any further medical issues. Please note that NO REFILLS  for any discharge medications will be authorized once you are discharged, as it is imperative that you return to your primary care physician (or establish a relationship with a primary care physician if you do not have one) for your aftercare needs so that they can reassess your need for medications and monitor your lab values. Today   SUBJECTIVE   Doing well  VITAL SIGNS:  Blood pressure (!) 142/65, pulse 64, temperature 98.2 F (36.8 C), temperature source Oral, resp. rate 16, height 5\' 8"  (1.727 m), weight 83.5 kg (184 lb), SpO2 100 %.  I/O:    Intake/Output Summary (Last 24 hours) at 01/24/16 1115 Last data filed at 01/24/16 1022  Gross per 24 hour  Intake              720 ml  Output              450 ml  Net              270 ml    PHYSICAL EXAMINATION:  GENERAL:  82 y.o.-year-old  patient lying in the bed with no acute distress.  EYES: Pupils equal, round, reactive to light and accommodation. No scleral icterus. Extraocular muscles intact.  HEENT: Head atraumatic, normocephalic. Oropharynx and nasopharynx clear.  NECK:  Supple, no jugular venous distention. No thyroid enlargement, no tenderness.  LUNGS: Normal breath sounds bilaterally, no wheezing, rales,rhonchi or crepitation. No use of accessory muscles of respiration.  CARDIOVASCULAR: S1, S2 normal. No murmurs, rubs, or gallops.  ABDOMEN: Soft, non-tender, non-distended. Bowel sounds present. No organomegaly or mass.  EXTREMITIES: No pedal edema, cyanosis, or clubbing.  NEUROLOGIC: Cranial nerves II through XII are intact. Muscle strength 5/5 in all extremities. Sensation intact. Gait not checked.  PSYCHIATRIC: The patient is alert and oriented x 3.  SKIN: No obvious rash, lesion, or ulcer.   DATA REVIEW:   CBC   Recent Labs Lab 01/22/16 1239  WBC 11.3*  HGB 8.6*  HCT 26.2*  PLT 279    Chemistries   Recent Labs Lab 01/22/16 1700  01/24/16 0431  NA  --   < > 142  K  --   < > 3.5  CL  --   < > 102  CO2   --   < > 32  GLUCOSE  --   < > 59*  BUN  --   < > 39*  CREATININE  --   < > 2.16*  CALCIUM  --   < > 8.8*  MG 2.1  --   --   < > = values in this interval not displayed.  Microbiology Results   No results found for this or any previous visit (from the past 240 hour(s)).  RADIOLOGY:  Dg Chest 2 View  Result Date: 01/22/2016 CLINICAL DATA:  Shortness of breath, low oxygen saturation, nonproductive cough last night EXAM: CHEST  2 VIEW COMPARISON:  12/13/2015 FINDINGS: Enlargement of cardiac silhouette with slight pulmonary vascular congestion. Atherosclerotic calcification aorta. BILATERAL pulmonary infiltrates favoring pulmonary edema. Associated small pleural effusions. No pneumothorax. Bones demineralized. IMPRESSION: Probable CHF with small bibasilar effusions. Electronically Signed   By: Lavonia Dana M.D.   On: 01/22/2016 13:03   Ct Chest Wo Contrast  Result Date: 01/24/2016 CLINICAL DATA:  Shortness of breath and cough for 2 days EXAM: CT CHEST WITHOUT CONTRAST TECHNIQUE: Multidetector CT imaging of the chest was performed following the standard protocol without IV contrast. COMPARISON:  Chest CT December 22, 2014; chest radiograph January 22, 2016 FINDINGS: Cardiovascular: The ascending thoracic aorta has a transverse diameter of 4.1 x 3.7 cm. There is atherosclerotic calcification throughout the thoracic aorta. The thoracic aorta is somewhat tortuous. The visualized great vessels show mild calcification at their respective origins. Pericardium is not appreciably thickened. There are foci of coronary artery calcification evident at multiple sites. Mediastinum/Nodes: There is an ill-defined mass in the right lobe of the thyroid measuring 1.6 x 1.5 cm. There is a mass in the left lobe of the thyroid measuring 1.8 x 1.2 cm. There are multiple prominent lymph nodes throughout the mediastinum, also present on previous study. Multiple lymph nodes are noted anterior and to the right of the trachea.  The largest individual lymph node in this area measures 2.4 x 2.3 cm, essentially stable. There is a lymph node to the left of the distal trachea measuring 2.5 x 1.6 cm, marginally increased from prior study. There are aortopulmonary window lymph nodes, slightly increased in size from prior study. The largest lymph nodes in this area measure 2.3 x 1.3 cm and 2.2  x 2.0 cm. Sub- carinal adenopathy is noted with the largest individual lymph node in the sub- carinal region measuring 2.5 x 2.5 cm, slightly larger than previous study for this lymph node measures 2.0 x 2.0 cm. Multiple other lymph nodes measuring slightly greater than 1 cm in short axis diameter remained without overall marginal increase in adenopathy compared to previous study. Lungs/Pleura: There is underlying centrilobular emphysematous type change. There is a right pleural effusion with partially loculated fluid. There is fluid tracking into the right major fissure. There is rounded atelectasis in the posterior left base with nearby there is atherosclerotic calcification in the visualized abdominal aorta. Visualized upper abdominal structures otherwise appear unremarkable. There are scattered areas of scarring in both lower lobes as well as in the right middle lobe and inferior lingular regions. There is mild lower lobe bronchiectatic change of also present previously. In comparison with the previous study, there is a 1.8 x 1.1 cm opacity abutting the pleura in the inferior lingula seen on axial slice 98 series 3, slightly larger compared to prior study. Upper Abdomen: There is atherosclerotic calcification in the upper abdominal aorta. Visualized upper abdominal structures otherwise appear unremarkable. Musculoskeletal: There is degenerative change in the thoracic spine. No blastic or lytic bone lesions are evident. IMPRESSION: There is a nodular opacity in the inferior most aspect of the lingula measuring 1.8 x 1.1 cm, slightly larger than on prior  study. This finding, coupled with the adenopathy noted above, warrants further evaluation. Advise PET-CT examination to further evaluate. Apparent rounded atelectasis in the posterior left base. A degree of superimposed pneumonia in this area cannot be excluded. Underlying emphysematous change.  There is bibasilar atelectasis. Multifocal adenopathy, slightly progressed from prior study. **An incidental finding of potential clinical significance has been found. There are nodular opacities in the thyroid, largest measuring 1.8 cm. Consider further evaluation with thyroid ultrasound. If patient is clinically hyperthyroid, consider nuclear medicine thyroid uptake and scan.** Extensive atherosclerotic calcification including foci of coronary artery calcification. Ascending thoracic aorta measures 4.1 x 3.7 cm. Recommend annual imaging followup by CTA or MRA. This recommendation follows 2010 ACCF/AHA/AATS/ACR/ASA/SCA/SCAI/SIR/STS/SVM Guidelines for the Diagnosis and Management of Patients with Thoracic Aortic Disease. Circulation. 2010; 121: I097-D532. Electronically Signed   By: Lowella Grip III M.D.   On: 01/24/2016 10:30     Management plans discussed with the patient, family and they are in agreement.  CODE STATUS:     Code Status Orders        Start     Ordered   01/24/16 0934  Full code  Continuous     01/24/16 0934    Code Status History    Date Active Date Inactive Code Status Order ID Comments User Context   01/22/2016  4:41 PM 01/24/2016  9:32 AM DNR [id number]  Demetrios Loll, MD Inpatient   12/21/2015  2:25 AM 12/21/2015  5:33 PM Full Code [id number]  Harvie Bridge, DO ED   11/20/2015 10:33 PM 11/22/2015  8:09 PM Full Code [id number]  Lance Coon, MD Inpatient   12/23/2014 11:44 AM 12/26/2014  7:40 PM Partial Code [id number]  Hillary Bow, MD Inpatient   12/23/2014  1:27 AM 12/23/2014 11:44 AM Full Code [id number]  Juluis Mire, MD Inpatient      TOTAL TIME TAKING CARE OF THIS PATIENT:  40 minutes.    Lennin Osmond M.D on 01/24/2016 at 11:15 AM  Between 7am to 6pm - Pager - [phone] After 6pm go to [email or web address] - [place]  [facility]  Office  [phone]  CC: Primary care physician; [place] Pink, MD

## 2016-01-24 NOTE — Progress Notes (Signed)
Discharge instructions and CHF packet given to patient and wife. Patient and wife verbalized understanding. IVs and telemetry removed. VSS. Patient in no distress at this time. [facility] came to check out portable oxygen tank that patient's son brought from home and educated patient and family on use of small portable o2 tank. [facility] also gave patient larger o2 tank to take home as well. Son and wife at bedside and will be transporting patient home.

## 2016-01-24 NOTE — Care Management Important Message (Signed)
Important Message  Patient Details  Name: MURLIN SCHRIEBER MRN: [id number] Date of Birth: [birth year]   Medicare Important Message Given:  Yes    Katrina Stack, RN 01/24/2016, 10:34 AM

## 2016-01-24 NOTE — Plan of Care (Signed)
Problem: Food- and Nutrition-Related Knowledge Deficit (NB-1.1) Goal: Nutrition education Formal process to instruct or train a patient/client in a skill or to impart knowledge to help patients/clients voluntarily manage or modify food choices and eating behavior to maintain or improve health. Outcome: Completed/Met Date Met: 01/24/16 Nutrition Education Note  RD consulted for nutrition education regarding new onset CHF.  RD provided "Low Sodium Nutrition Therapy" handout from the [employer]. Reviewed patient's dietary recall. Provided examples on ways to decrease sodium intake in diet. Discouraged intake of processed foods and use of salt shaker. Encouraged fresh fruits and vegetables as well as whole grain sources of carbohydrates to maximize fiber intake.   RD discussed why it is important for patient to adhere to diet recommendations, and emphasized the role of fluids, foods to avoid, and importance of weighing self daily. Teach back method used.  Expect fair compliance.  Body mass index is 27.98 kg/m. Pt meets criteria for overweight based on current BMI.  Current diet order is heart healthy/carb modified, patient is consuming approximately 100% of meals at this time. Labs and medications reviewed. No further nutrition interventions warranted at this time. RD contact information provided. If additional nutrition issues arise, please re-consult RD.   Patrick [name]. Ioan Landini, MS, RD LDN Inpatient Clinical Dietitian Pager [phone]

## 2016-01-25 DIAGNOSIS — J189 Pneumonia, unspecified organism: Secondary | ICD-10-CM | POA: Diagnosis not present

## 2016-01-25 DIAGNOSIS — I130 Hypertensive heart and chronic kidney disease with heart failure and stage 1 through stage 4 chronic kidney disease, or unspecified chronic kidney disease: Secondary | ICD-10-CM | POA: Diagnosis not present

## 2016-01-25 DIAGNOSIS — E785 Hyperlipidemia, unspecified: Secondary | ICD-10-CM | POA: Diagnosis not present

## 2016-01-25 DIAGNOSIS — I5043 Acute on chronic combined systolic (congestive) and diastolic (congestive) heart failure: Secondary | ICD-10-CM | POA: Diagnosis not present

## 2016-01-25 DIAGNOSIS — N179 Acute kidney failure, unspecified: Secondary | ICD-10-CM | POA: Diagnosis not present

## 2016-01-25 DIAGNOSIS — N183 Chronic kidney disease, stage 3 (moderate): Secondary | ICD-10-CM | POA: Diagnosis not present

## 2016-01-25 DIAGNOSIS — I5042 Chronic combined systolic (congestive) and diastolic (congestive) heart failure: Secondary | ICD-10-CM | POA: Diagnosis not present

## 2016-01-25 DIAGNOSIS — E119 Type 2 diabetes mellitus without complications: Secondary | ICD-10-CM | POA: Diagnosis not present

## 2016-01-25 DIAGNOSIS — D638 Anemia in other chronic diseases classified elsewhere: Secondary | ICD-10-CM | POA: Diagnosis not present

## 2016-01-25 DIAGNOSIS — Z9981 Dependence on supplemental oxygen: Secondary | ICD-10-CM | POA: Diagnosis not present

## 2016-01-25 DIAGNOSIS — I481 Persistent atrial fibrillation: Secondary | ICD-10-CM | POA: Diagnosis not present

## 2016-01-25 DIAGNOSIS — E1122 Type 2 diabetes mellitus with diabetic chronic kidney disease: Secondary | ICD-10-CM | POA: Diagnosis not present

## 2016-01-25 DIAGNOSIS — J9601 Acute respiratory failure with hypoxia: Secondary | ICD-10-CM | POA: Diagnosis not present

## 2016-01-31 DIAGNOSIS — J9601 Acute respiratory failure with hypoxia: Secondary | ICD-10-CM | POA: Diagnosis not present

## 2016-01-31 DIAGNOSIS — I5032 Chronic diastolic (congestive) heart failure: Secondary | ICD-10-CM | POA: Diagnosis not present

## 2016-01-31 DIAGNOSIS — D638 Anemia in other chronic diseases classified elsewhere: Secondary | ICD-10-CM | POA: Diagnosis not present

## 2016-01-31 DIAGNOSIS — I5043 Acute on chronic combined systolic (congestive) and diastolic (congestive) heart failure: Secondary | ICD-10-CM | POA: Diagnosis not present

## 2016-01-31 DIAGNOSIS — E785 Hyperlipidemia, unspecified: Secondary | ICD-10-CM | POA: Diagnosis not present

## 2016-01-31 DIAGNOSIS — R911 Solitary pulmonary nodule: Secondary | ICD-10-CM | POA: Diagnosis not present

## 2016-01-31 DIAGNOSIS — E1122 Type 2 diabetes mellitus with diabetic chronic kidney disease: Secondary | ICD-10-CM | POA: Diagnosis not present

## 2016-01-31 DIAGNOSIS — E041 Nontoxic single thyroid nodule: Secondary | ICD-10-CM | POA: Diagnosis not present

## 2016-01-31 DIAGNOSIS — N183 Chronic kidney disease, stage 3 (moderate): Secondary | ICD-10-CM | POA: Diagnosis not present

## 2016-01-31 DIAGNOSIS — I130 Hypertensive heart and chronic kidney disease with heart failure and stage 1 through stage 4 chronic kidney disease, or unspecified chronic kidney disease: Secondary | ICD-10-CM | POA: Diagnosis not present

## 2016-01-31 DIAGNOSIS — Z9981 Dependence on supplemental oxygen: Secondary | ICD-10-CM | POA: Diagnosis not present

## 2016-01-31 DIAGNOSIS — I481 Persistent atrial fibrillation: Secondary | ICD-10-CM | POA: Diagnosis not present

## 2016-02-01 ENCOUNTER — Inpatient Hospital Stay: Payer: Commercial Managed Care - HMO

## 2016-02-01 ENCOUNTER — Other Ambulatory Visit: Payer: Self-pay | Admitting: *Deleted

## 2016-02-01 ENCOUNTER — Inpatient Hospital Stay: Payer: Commercial Managed Care - HMO | Admitting: Oncology

## 2016-02-01 DIAGNOSIS — D649 Anemia, unspecified: Secondary | ICD-10-CM

## 2016-02-02 ENCOUNTER — Other Ambulatory Visit: Payer: Self-pay | Admitting: Family Medicine

## 2016-02-02 DIAGNOSIS — R911 Solitary pulmonary nodule: Secondary | ICD-10-CM

## 2016-02-04 NOTE — Progress Notes (Signed)
Patrick [name]  [phone] [phone]  ID: ANTINO MAYABB OB: [birth year]  MR#: [id number]  OIZ#:[id number]  Patient Care Team: [place] Pink, MD as PCP - General (Family Medicine) Alisa Graff, FNP as Nurse Practitioner (Family Medicine) Rise Mu, PA-C as Physician Assistant (Cardiology) Lloyd Huger, MD as Consulting Physician (Hematology) Lavonia Dana, MD as Consulting Physician (Nephrology) Judi Cong, MD as Physician Assistant (Endocrinology)  CHIEF COMPLAINT: Anemia of chronic disease  INTERVAL HISTORY: Patient returns to clinic today for further evaluation, laboratory work, and consideration of additional Procrit. He recently had an extended stay at the hospital for CHF exacerbation and was noted to have an enlarging pulmonary nodule. He continues to have increased weakness and fatigue, but states this is improved since discharge. He also continues to have chronic shortness of breath.  He has no neurologic complaints. He denies any weight loss. He denies any recent fevers. He denies any chest pain. He has no nausea, vomiting, constipation, or diarrhea. He has no urinary complaints. Patient offers no further specific complaints.   REVIEW OF SYSTEMS:   Review of Systems  Constitutional: Positive for malaise/fatigue. Negative for fever and weight loss.  Respiratory: Positive for cough.   Cardiovascular: Negative.  Negative for chest pain and leg swelling.  Gastrointestinal: Negative.  Negative for abdominal pain.  Genitourinary: Negative.   Musculoskeletal: Negative.   Neurological: Positive for weakness.  Psychiatric/Behavioral: Negative.  The patient is not nervous/anxious.     As per HPI. Otherwise, a complete review of systems is negative.  PAST MEDICAL HISTORY: Past Medical History:  Diagnosis Date  . Amputated finger   . Anemia of chronic disease    a. plan of oncology to start Procrit - received during  admission 12/2013  . Atypical chest pain    a. 12/2014 Neg CE - in setting of L pleural effusion.  . Bell's palsy   . Chronic combined systolic and diastolic CHF (congestive heart failure) ([place])    a. 11/2012 Echo: EF 60-65%, mod conc LVH, mildly dil LA/RA, mild Ao sclerosis w/o stenosis; b. 11/2014 Echo: EF 40-45%, prob mid-apicalanteroseptal, ant, apical HK, Gr2 DD, mod dil LA, mildly dil RA (technically difficult study); c. echo 11/2015: EF 60-65%, trivial AI, nl RV sys fxan, PASP 59; d. echo 10/17: EF 60-65%, no RWMA, mildly dilated LA, mildly reduced RV sys fxn, PASP 70  . CKD (chronic kidney disease), stage III    a. stage III-IV  . Diabetes mellitus without complication ([place])   . GERD (gastroesophageal reflux disease)   . History of gastroesophageal reflux (GERD)   . Hyperlipidemia   . Hypertension   . Permanent atrial fibrillation ([place])    a. Dx 12/2011, Rate-controlled, chronic Xarelto (renal dosing) - CHA2DS2VASc = 5.  . Pleural effusion, left    a. 12/2014 s/p thoracentesis - protein <3, LDH 123, no malignancy.    PAST SURGICAL HISTORY: Past Surgical History:  Procedure Laterality Date  . APPENDECTOMY    . COLONOSCOPY  09/2011  . ESOPHAGOGASTRODUODENOSCOPY    . FINGER SURGERY     right hand    FAMILY HISTORY Family History  Problem Relation Age of Onset  . Heart attack Brother   . Diabetes Mother   . Diabetes Sister   . Hypertension         ADVANCED DIRECTIVES:    HEALTH MAINTENANCE: Social History  Substance Use Topics  . Smoking status: Former Smoker    Packs/day: 0.25  Years: 10.00    Types: Cigars    Quit date: 05/06/1966  . Smokeless tobacco: Never Used  . Alcohol use No    Allergies  Allergen Reactions  . Levofloxacin Other (See Comments)    confusion  . Prednisone Other (See Comments)    confusion    Current Outpatient Prescriptions  Medication Sig Dispense Refill  . aspirin 81 MG tablet Take 81 mg by mouth daily.    . cloNIDine  (CATAPRES) 0.1 MG tablet Take 1 tablet (0.1 mg total) by mouth daily. (Patient taking differently: Take 0.1 mg by mouth 2 (two) times daily. ) 180 tablet 3  . insulin lispro protamine-lispro (HUMALOG MIX 75/25) (75-25) 100 UNIT/ML SUSP injection Inject 50 Units into the skin 2 (two) times daily. Don't take if under 150 10 mL 11  . omeprazole (PRILOSEC) 40 MG capsule Take 1 capsule (40 mg total) by mouth daily. 90 capsule 3  . potassium chloride 20 MEQ TBCR Take 20 mEq by mouth every other day. (Patient taking differently: Take 20 mEq by mouth every other day. Tuesday and Thursday if fluid on board) 30 tablet 1  . pravastatin (PRAVACHOL) 40 MG tablet Take 2 tablets (80 mg total) by mouth daily. Take two tablets daily. (Patient taking differently: Take 80 mg by mouth daily. ) 180 tablet 3  . Rivaroxaban (XARELTO) 15 MG TABS tablet Take 1 tablet (15 mg total) by mouth daily. 30 tablet 3  . torsemide (DEMADEX) 20 MG tablet Take 1 tablet (20 mg total) by mouth every other day. (Patient taking differently: Take 20 mg by mouth every other day. As needed on Tues/Thurs for fluid) 60 tablet 1   No current facility-administered medications for this visit.    Facility-Administered Medications Ordered in Other Visits  Medication Dose Route Frequency Provider Last Rate Last Dose  . epoetin alfa (EPOGEN,PROCRIT) injection 40,000 Units  40,000 Units Subcutaneous Once Lloyd Huger, MD        OBJECTIVE: Vitals:   02/05/16 1512  BP: (!) 155/84  Pulse: (!) 102  Resp: 18  Temp: 98.2 F (36.8 C)     Body mass index is 28.74 kg/m.    ECOG FS:1 - Symptomatic but completely ambulatory  General: Well-developed, well-nourished, no acute distress. Eyes: anicteric sclera. Lungs: Clear to auscultation bilaterally. Heart: Regular rate and rhythm. No rubs, murmurs, or gallops. Abdomen: Soft, nontender, nondistended. No organomegaly noted, normoactive bowel sounds. Musculoskeletal: No edema, cyanosis, or  clubbing. Neuro: Alert, answering all questions appropriately. Cranial nerves grossly intact. Skin: No rashes or petechiae noted. Psych: Normal affect.   LAB RESULTS:  Lab Results  Component Value Date   NA 137 02/05/2016   K 4.3 02/05/2016   CL 100 (L) 02/05/2016   CO2 27 02/05/2016   GLUCOSE 169 (H) 02/05/2016   BUN 30 (H) 02/05/2016   CREATININE 2.53 (H) 02/05/2016   CALCIUM 9.0 02/05/2016   PROT 7.3 12/20/2015   ALBUMIN 3.3 (L) 12/20/2015   AST 26 12/20/2015   ALT 15 (L) 12/20/2015   ALKPHOS 87 12/20/2015   BILITOT 0.9 12/20/2015   GFRNONAA 22 (L) 02/05/2016   GFRAA 26 (L) 02/05/2016    Lab Results  Component Value Date   WBC 7.8 02/05/2016   NEUTROABS 5.7 02/05/2016   HGB 9.3 (L) 02/05/2016   HCT 28.4 (L) 02/05/2016   MCV 86.3 02/05/2016   PLT 400 02/05/2016   Lab Results  Component Value Date   IRON 26 (L) 02/05/2016   TIBC 377  02/05/2016   IRONPCTSAT 7 (L) 02/05/2016    Lab Results  Component Value Date   FERRITIN 65 02/05/2016     STUDIES: Dg Chest 2 View  Result Date: 01/22/2016 CLINICAL DATA:  Shortness of breath, low oxygen saturation, nonproductive cough last night EXAM: CHEST  2 VIEW COMPARISON:  12/13/2015 FINDINGS: Enlargement of cardiac silhouette with slight pulmonary vascular congestion. Atherosclerotic calcification aorta. BILATERAL pulmonary infiltrates favoring pulmonary edema. Associated small pleural effusions. No pneumothorax. Bones demineralized. IMPRESSION: Probable CHF with small bibasilar effusions. Electronically Signed   By: Lavonia Dana M.D.   On: 01/22/2016 13:03   Ct Chest Wo Contrast  Result Date: 01/24/2016 CLINICAL DATA:  Shortness of breath and cough for 2 days EXAM: CT CHEST WITHOUT CONTRAST TECHNIQUE: Multidetector CT imaging of the chest was performed following the standard protocol without IV contrast. COMPARISON:  Chest CT December 22, 2014; chest radiograph January 22, 2016 FINDINGS: Cardiovascular: The ascending  thoracic aorta has a transverse diameter of 4.1 x 3.7 cm. There is atherosclerotic calcification throughout the thoracic aorta. The thoracic aorta is somewhat tortuous. The visualized great vessels show mild calcification at their respective origins. Pericardium is not appreciably thickened. There are foci of coronary artery calcification evident at multiple sites. Mediastinum/Nodes: There is an ill-defined mass in the right lobe of the thyroid measuring 1.6 x 1.5 cm. There is a mass in the left lobe of the thyroid measuring 1.8 x 1.2 cm. There are multiple prominent lymph nodes throughout the mediastinum, also present on previous study. Multiple lymph nodes are noted anterior and to the right of the trachea. The largest individual lymph node in this area measures 2.4 x 2.3 cm, essentially stable. There is a lymph node to the left of the distal trachea measuring 2.5 x 1.6 cm, marginally increased from prior study. There are aortopulmonary window lymph nodes, slightly increased in size from prior study. The largest lymph nodes in this area measure 2.3 x 1.3 cm and 2.2 x 2.0 cm. Sub- carinal adenopathy is noted with the largest individual lymph node in the sub- carinal region measuring 2.5 x 2.5 cm, slightly larger than previous study for this lymph node measures 2.0 x 2.0 cm. Multiple other lymph nodes measuring slightly greater than 1 cm in short axis diameter remained without overall marginal increase in adenopathy compared to previous study. Lungs/Pleura: There is underlying centrilobular emphysematous type change. There is a right pleural effusion with partially loculated fluid. There is fluid tracking into the right major fissure. There is rounded atelectasis in the posterior left base with nearby there is atherosclerotic calcification in the visualized abdominal aorta. Visualized upper abdominal structures otherwise appear unremarkable. There are scattered areas of scarring in both lower lobes as well as in the  right middle lobe and inferior lingular regions. There is mild lower lobe bronchiectatic change of also present previously. In comparison with the previous study, there is a 1.8 x 1.1 cm opacity abutting the pleura in the inferior lingula seen on axial slice 98 series 3, slightly larger compared to prior study. Upper Abdomen: There is atherosclerotic calcification in the upper abdominal aorta. Visualized upper abdominal structures otherwise appear unremarkable. Musculoskeletal: There is degenerative change in the thoracic spine. No blastic or lytic bone lesions are evident. IMPRESSION: There is a nodular opacity in the inferior most aspect of the lingula measuring 1.8 x 1.1 cm, slightly larger than on prior study. This finding, coupled with the adenopathy noted above, warrants further evaluation. Advise PET-CT examination to further  evaluate. Apparent rounded atelectasis in the posterior left base. A degree of superimposed pneumonia in this area cannot be excluded. Underlying emphysematous change.  There is bibasilar atelectasis. Multifocal adenopathy, slightly progressed from prior study. **An incidental finding of potential clinical significance has been found. There are nodular opacities in the thyroid, largest measuring 1.8 cm. Consider further evaluation with thyroid ultrasound. If patient is clinically hyperthyroid, consider nuclear medicine thyroid uptake and scan.** Extensive atherosclerotic calcification including foci of coronary artery calcification. Ascending thoracic aorta measures 4.1 x 3.7 cm. Recommend annual imaging followup by CTA or MRA. This recommendation follows 2010 ACCF/AHA/AATS/ACR/ASA/SCA/SCAI/SIR/STS/SVM Guidelines for the Diagnosis and Management of Patients with Thoracic Aortic Disease. Circulation. 2010; 121: H150-V697. Electronically Signed   By: Lowella Grip III M.D.   On: 01/24/2016 10:30    ASSESSMENT: Anemia of chronic disease  PLAN:    1. Anemia of chronic disease:  Patient's hemoglobin has trended down and is now 9.3, but this is still improved from his recent admission. Iron stores from today are also decreased. Will hold Procrit for now and patient will return to clinic in approximately 3 weeks to receive IV Feraheme. We will consider reinitiating Procrit in the future once his iron stores are repleted. 2. Lingula mass: Patient has a PET scan scheduled on February 21, 2016. This is highly concerning for malignancy. Return to clinic one to 2 days later to discuss the results and IV Feraheme as above. 3. Chronic renal insufficiency: Creatinine slightly worse, treatment per nephrology.  Patient expressed understanding and was in agreement with this plan. He also understands that He can call clinic at any time with any questions, concerns, or complaints.    Lloyd Huger, MD   02/06/2016 1:20 PM

## 2016-02-05 ENCOUNTER — Inpatient Hospital Stay: Payer: Commercial Managed Care - HMO | Attending: Oncology | Admitting: Oncology

## 2016-02-05 ENCOUNTER — Inpatient Hospital Stay: Payer: Commercial Managed Care - HMO

## 2016-02-05 ENCOUNTER — Ambulatory Visit (INDEPENDENT_AMBULATORY_CARE_PROVIDER_SITE_OTHER): Payer: Commercial Managed Care - HMO | Admitting: Physician Assistant

## 2016-02-05 ENCOUNTER — Encounter: Payer: Self-pay | Admitting: Physician Assistant

## 2016-02-05 VITALS — BP 138/76 | HR 82 | Ht 68.0 in | Wt 188.0 lb

## 2016-02-05 VITALS — BP 155/84 | HR 102 | Temp 98.2°F | Resp 18 | Wt 189.0 lb

## 2016-02-05 DIAGNOSIS — Z794 Long term (current) use of insulin: Secondary | ICD-10-CM | POA: Diagnosis not present

## 2016-02-05 DIAGNOSIS — N183 Chronic kidney disease, stage 3 unspecified: Secondary | ICD-10-CM

## 2016-02-05 DIAGNOSIS — I5042 Chronic combined systolic (congestive) and diastolic (congestive) heart failure: Secondary | ICD-10-CM | POA: Insufficient documentation

## 2016-02-05 DIAGNOSIS — E1122 Type 2 diabetes mellitus with diabetic chronic kidney disease: Secondary | ICD-10-CM | POA: Diagnosis not present

## 2016-02-05 DIAGNOSIS — D638 Anemia in other chronic diseases classified elsewhere: Secondary | ICD-10-CM

## 2016-02-05 DIAGNOSIS — I130 Hypertensive heart and chronic kidney disease with heart failure and stage 1 through stage 4 chronic kidney disease, or unspecified chronic kidney disease: Secondary | ICD-10-CM | POA: Insufficient documentation

## 2016-02-05 DIAGNOSIS — J9621 Acute and chronic respiratory failure with hypoxia: Secondary | ICD-10-CM

## 2016-02-05 DIAGNOSIS — I110 Hypertensive heart disease with heart failure: Secondary | ICD-10-CM | POA: Diagnosis not present

## 2016-02-05 DIAGNOSIS — D649 Anemia, unspecified: Secondary | ICD-10-CM

## 2016-02-05 DIAGNOSIS — E785 Hyperlipidemia, unspecified: Secondary | ICD-10-CM | POA: Diagnosis not present

## 2016-02-05 DIAGNOSIS — K219 Gastro-esophageal reflux disease without esophagitis: Secondary | ICD-10-CM | POA: Diagnosis not present

## 2016-02-05 DIAGNOSIS — I482 Chronic atrial fibrillation: Secondary | ICD-10-CM | POA: Diagnosis not present

## 2016-02-05 DIAGNOSIS — Z7982 Long term (current) use of aspirin: Secondary | ICD-10-CM | POA: Insufficient documentation

## 2016-02-05 DIAGNOSIS — IMO0001 Reserved for inherently not codable concepts without codable children: Secondary | ICD-10-CM

## 2016-02-05 DIAGNOSIS — I481 Persistent atrial fibrillation: Secondary | ICD-10-CM

## 2016-02-05 DIAGNOSIS — E782 Mixed hyperlipidemia: Secondary | ICD-10-CM

## 2016-02-05 DIAGNOSIS — J9611 Chronic respiratory failure with hypoxia: Secondary | ICD-10-CM | POA: Diagnosis not present

## 2016-02-05 DIAGNOSIS — I4819 Other persistent atrial fibrillation: Secondary | ICD-10-CM

## 2016-02-05 DIAGNOSIS — Z7901 Long term (current) use of anticoagulants: Secondary | ICD-10-CM | POA: Insufficient documentation

## 2016-02-05 DIAGNOSIS — Z87891 Personal history of nicotine dependence: Secondary | ICD-10-CM | POA: Diagnosis not present

## 2016-02-05 DIAGNOSIS — Z79899 Other long term (current) drug therapy: Secondary | ICD-10-CM | POA: Insufficient documentation

## 2016-02-05 DIAGNOSIS — R0602 Shortness of breath: Secondary | ICD-10-CM

## 2016-02-05 LAB — BASIC METABOLIC PANEL
ANION GAP: 10 (ref 5–15)
BUN: 30 mg/dL — ABNORMAL HIGH (ref 6–20)
CALCIUM: 9 mg/dL (ref 8.9–10.3)
CO2: 27 mmol/L (ref 22–32)
CREATININE: 2.53 mg/dL — AB (ref 0.61–1.24)
Chloride: 100 mmol/L — ABNORMAL LOW (ref 101–111)
GFR calc non Af Amer: 22 mL/min — ABNORMAL LOW (ref >60)
GFR, EST AFRICAN AMERICAN: 26 mL/min — AB (ref >60)
Glucose, Bld: 169 mg/dL — ABNORMAL HIGH (ref 65–99)
Potassium: 4.3 mmol/L (ref 3.5–5.1)
SODIUM: 137 mmol/L (ref 135–145)

## 2016-02-05 LAB — CBC WITH DIFFERENTIAL/PLATELET
Basophils Absolute: 0.1 10*3/uL (ref 0–0.1)
Basophils Relative: 1 %
EOS PCT: 6 %
Eosinophils Absolute: 0.4 10*3/uL (ref 0–0.7)
HEMATOCRIT: 28.4 % — AB (ref 40.0–52.0)
Hemoglobin: 9.3 g/dL — ABNORMAL LOW (ref 13.0–18.0)
LYMPHS ABS: 0.9 10*3/uL — AB (ref 1.0–3.6)
Lymphocytes Relative: 11 %
MCH: 28.2 pg (ref 26.0–34.0)
MCHC: 32.7 g/dL (ref 32.0–36.0)
MCV: 86.3 fL (ref 80.0–100.0)
MONO ABS: 0.7 10*3/uL (ref 0.2–1.0)
Monocytes Relative: 9 %
NEUTROS ABS: 5.7 10*3/uL (ref 1.4–6.5)
Neutrophils Relative %: 73 %
PLATELETS: 400 10*3/uL (ref 150–440)
RBC: 3.29 MIL/uL — ABNORMAL LOW (ref 4.40–5.90)
RDW: 16.4 % — AB (ref 11.5–14.5)
WBC: 7.8 10*3/uL (ref 3.8–10.6)

## 2016-02-05 LAB — IRON AND TIBC
Iron: 26 ug/dL — ABNORMAL LOW (ref 45–182)
Saturation Ratios: 7 % — ABNORMAL LOW (ref 17.9–39.5)
TIBC: 377 ug/dL (ref 250–450)
UIBC: 351 ug/dL

## 2016-02-05 LAB — FERRITIN: Ferritin: 65 ng/mL (ref 24–336)

## 2016-02-05 NOTE — Progress Notes (Signed)
Cardiology Office Note Date:  02/05/2016  Patient ID:  Patrick, [name] [birth year], MRN [id number] PCP:  Patrick Pink, MD  Cardiologist:  Dr. [name], MD    Chief Complaint: Hospital follow up  History of Present Illness: Patrick [name] is a 82 y.o. male with history of chronic combined CHF, persistent Afib on Xarelto, CKD stage III-IV, left transudative pleural effusion s/p thoracentesis 12/2014, recent PNA, anemia of chronic disease, and hypertensive heart disease who presents for hospital follow up after recent admission to [facility] form 10/30 to 11/1 for acute on chronic respiratory failure.   He was recently admitted in late September with acute on chronic diastolic CHF. Echo at that time showed normal EF with PASP 59 mmHg. He was diuresed and sent home on torsemide every other day. On 10/30 he developed sudden onset of SOB and presented to [facility] where he was found to have acute on chronic respiratory failure felt to be multifactorial including some component of CHF exacerbation and worsening anemia with a 3 gram drop from 11.3 during prior admission to 8.6 this admission, with reported negative hemoccult in the ED. BNP of 508 (prior 738), SCr 1.9, BUN 30, hgb 11.3, negative troponin, and CXR with small bibasilar effusions. Repeat echo showed normal LV systolic function with an EF of 60-65%, nmo RWMA, mildly dilated left atrium, mildly increased RV wall thickness with mild reduction in systolic function, PASP increased at 70 mmHg. It was recommended by cardiology the patient undergo extensive further workup of his SOB and anemia with testing such as CT chest to evaluate for parenchymal lung disease, SPEP and UPEP. CT chest was done prior to discharge that showed underlying emphysema, pulmonary nodule measuring 1.8x1.1 cm with recommend follow up PET-CT, multiple thyroid nodules with recommended thyroid ultrasound, extensive atherosclerotic calcification including foci of coronary  artery calcification, and ascending aorta measuring 4.1x37 cm with annual follow up imaging advised. Cardiology felt if that work up was unrevealing then he would need to proceed with stress testing and possible L/RHC.   Since his hospital discharge he has done well and is taking torsemide 20 mg prn weight gain over 1.5 pounds nightly, needing only several doses since his discharge, most recently this morning given weight gain of 1.5 pounds overnight. He remains on [place] at 2 L sometimes increasing to 3 L with ambulation. Abdominal distension improved. No LE swelling. He is scheduled to have Epogen injection through the [facility] later today. PCP has patient scheduled for follow up imaging of thyroid and PET-CT as above. Na intake limited to approximately 1000 mg daily. Limits PO fluid consumption to less than 1.5 L daily.    Past Medical History:  Diagnosis Date  . Amputated finger   . Anemia of chronic disease    a. plan of oncology to start Procrit - received during admission 12/2013  . Atypical chest pain    a. 12/2014 Neg CE - in setting of L pleural effusion.  . Bell's palsy   . Chronic combined systolic and diastolic CHF (congestive heart failure) ([place])    a. 11/2012 Echo: EF 60-65%, mod conc LVH, mildly dil LA/RA, mild Ao sclerosis w/o stenosis; b. 11/2014 Echo: EF 40-45%, prob mid-apicalanteroseptal, ant, apical HK, Gr2 DD, mod dil LA, mildly dil RA (technically difficult study); c. echo 11/2015: EF 60-65%, trivial AI, nl RV sys fxan, PASP 59; d. echo 10/17: EF 60-65%, no RWMA, mildly dilated LA, mildly reduced RV sys fxn, PASP 70  . CKD (chronic  kidney disease), stage III    a. stage III-IV  . Diabetes mellitus without complication ([place])   . GERD (gastroesophageal reflux disease)   . History of gastroesophageal reflux (GERD)   . Hyperlipidemia   . Hypertension   . Permanent atrial fibrillation ([place])    a. Dx 12/2011, Rate-controlled, chronic Xarelto (renal dosing) - CHA2DS2VASc = 5.    . Pleural effusion, left    a. 12/2014 s/p thoracentesis - protein <3, LDH 123, no malignancy.    Past Surgical History:  Procedure Laterality Date  . APPENDECTOMY    . COLONOSCOPY  09/2011  . ESOPHAGOGASTRODUODENOSCOPY    . FINGER SURGERY     right hand    Current Outpatient Prescriptions  Medication Sig Dispense Refill  . aspirin 81 MG tablet Take 81 mg by mouth daily.    . cloNIDine (CATAPRES) 0.1 MG tablet Take 1 tablet (0.1 mg total) by mouth daily. (Patient taking differently: Take 0.1 mg by mouth 2 (two) times daily. ) 180 tablet 3  . insulin lispro protamine-lispro (HUMALOG MIX 75/25) (75-25) 100 UNIT/ML SUSP injection Inject 50 Units into the skin 2 (two) times daily. Don't take if under 150 10 mL 11  . omeprazole (PRILOSEC) 40 MG capsule Take 1 capsule (40 mg total) by mouth daily. 90 capsule 3  . potassium chloride 20 MEQ TBCR Take 20 mEq by mouth every other day. (Patient taking differently: Take 20 mEq by mouth every other day. Tuesday and Thursday if fluid on board) 30 tablet 1  . pravastatin (PRAVACHOL) 40 MG tablet Take 2 tablets (80 mg total) by mouth daily. Take two tablets daily. (Patient taking differently: Take 80 mg by mouth daily. ) 180 tablet 3  . Rivaroxaban (XARELTO) 15 MG TABS tablet Take 1 tablet (15 mg total) by mouth daily. 30 tablet 3  . torsemide (DEMADEX) 20 MG tablet Take 1 tablet (20 mg total) by mouth every other day. (Patient taking differently: Take 20 mg by mouth every other day. As needed on Tues/Thurs for fluid) 60 tablet 1   No current facility-administered medications for this visit.     Allergies:   Levofloxacin and Prednisone   Social History:  The patient  reports that he quit smoking about 49 years ago. His smoking use included Cigars. He has a 2.50 pack-year smoking history. He has never used smokeless tobacco. He reports that he does not drink alcohol or use drugs.   Family History:  The patient's family history includes Diabetes in his  mother and sister; Heart attack in his brother.  ROS:   Review of Systems  Constitutional: Positive for malaise/fatigue. Negative for chills, diaphoresis, fever and weight loss.  HENT: Negative for congestion.   Eyes: Negative for discharge and redness.  Respiratory: Positive for shortness of breath. Negative for cough, hemoptysis, sputum production and wheezing.        SOB stable, at baseline  Cardiovascular: Negative for chest pain, palpitations, orthopnea, claudication, leg swelling and PND.  Gastrointestinal: Negative for abdominal pain, blood in stool, heartburn, melena, nausea and vomiting.  Genitourinary: Negative for hematuria.  Musculoskeletal: Negative for falls and myalgias.  Skin: Negative for rash.  Neurological: Positive for weakness. Negative for dizziness, tingling, tremors, sensory change, speech change, focal weakness and loss of consciousness.  Endo/Heme/Allergies: Does not bruise/bleed easily.  Psychiatric/Behavioral: Negative for substance abuse. The patient is not nervous/anxious.   All other systems reviewed and are negative.    PHYSICAL EXAM:  VS:  BP 138/76 (BP Location:  Left Arm, Patient Position: Sitting, Cuff Size: Normal)   Pulse 82   Ht 5\' 8"  (1.727 m)   Wt 188 lb (85.3 kg)   BMI 28.59 kg/m  BMI: Body mass index is 28.59 kg/m.  Physical Exam  Constitutional: He is oriented to person, place, and time. He appears well-developed and well-nourished.  HENT:  Head: Normocephalic and atraumatic.  Eyes: Right eye exhibits no discharge. Left eye exhibits no discharge.  Neck: Normal range of motion. No JVD present.  Cardiovascular: Normal rate, S1 normal, S2 normal and normal heart sounds.  An irregularly irregular rhythm present. Exam reveals no distant heart sounds, no friction rub, no midsystolic click and no opening snap.   No murmur heard. Pulmonary/Chest: Effort normal and breath sounds normal. No respiratory distress. He has no decreased breath sounds.  He has no wheezes. He has no rales. He exhibits no tenderness.  Abdominal: Soft. He exhibits no distension. There is no tenderness.  Musculoskeletal: He exhibits edema.  Trace pre-tibial edema bilaterally   Neurological: He is alert and oriented to person, place, and time.  Skin: Skin is warm and dry. No cyanosis. Nails show no clubbing.  Psychiatric: He has a normal mood and affect. His speech is normal and behavior is normal. Judgment and thought content normal.     EKG:  Was ordered and interpreted by me today. Shows Afib, 82 bpm, no acute st/t changes   Recent Labs: 12/20/2015: ALT 15 12/21/2015: TSH 0.859 01/22/2016: B Natriuretic Peptide 508.0; Magnesium 2.1 01/24/2016: BUN 39; Creatinine, Ser 2.16; Potassium 3.5; Sodium 142 02/05/2016: Hemoglobin 9.3; Platelets 400   Estimated Creatinine Clearance: 28 mL/min (by C-G formula based on SCr of 2.16 mg/dL (H)).   Wt Readings from Last 3 Encounters:  02/05/16 188 lb (85.3 kg)  01/24/16 184 lb (83.5 kg)  01/08/16 193 lb (87.5 kg)     Other studies reviewed: Additional studies/records reviewed today include: summarized above  ASSESSMENT AND PLAN:  1. Chronic respiratory failure: Stable. Currently on 2 L via nasal cannula. Likely multifactorial including CHF, anemia, deconditioning, and obesity.   2. Chronic combined CHF/pulmonary hypertension: Continue torsemide 20 mg prn weight gain for now until we see what his labs show from bmet today. If needed, he could increase to 20 mg every other day. Not on beta blocker given bradycardia in the hospital. CHF education discussed in detail.   3. Persistent Afib: Currently in rate controlled Afib with a heart rate in the 80's bpm. Not on rate controlling medications given bradycardia in the hospital. If needed, could taper clonidine and restart metoprolol. CHADS2VASc at least 5 (CHF, HTN, age x 2, DM). Continue dose adjusted Xarelto 15 mg q dinner given CrCl of 33.3 from bmet on 01/24/16.    4. Hypertensive heart disease: Well controlled. Continue current medications for now. If needed for rate control could taper clonidine and restart metoprolol as above.   5. Acute on chronic anemia of chronic disease: He is for Epogen infusion today at the [facility]. Drop in hgb likely playing a role in his SOB and 3rd spacing.   6. CKD stage III-IV: Check bmet today. To be drawn at the [place] to limit venipunctures.  7. HLD: [place].   Disposition: F/u with Dr. [name], Ignacia Bayley, or myself in 1 month.   Current medicines are reviewed at length with the patient today.  The patient did not have any concerns regarding medicines.  Melvern Banker PA-C 02/05/2016 3:11 PM  [address] [place] [address] [place], [place] [address] [phone]

## 2016-02-05 NOTE — Progress Notes (Signed)
Offers no complaints  

## 2016-02-05 NOTE — Patient Instructions (Addendum)
Medication Instructions:  Please continue your current medications  Labwork: Please have BMET drawn @ the [facility]  Testing/Procedures: [facility]  Your caregiver has ordered a Stress Test with nuclear imaging. The purpose of this test is to evaluate the blood supply to your heart muscle. This procedure is referred to as a "Non-Invasive Stress Test." This is because other than having an IV started in your vein, nothing is inserted or "invades" your body. Cardiac stress tests are done to find areas of poor blood flow to the heart by determining the extent of coronary artery disease (CAD). Some patients exercise on a treadmill, which naturally increases the blood flow to your heart, while others who are  unable to walk on a treadmill due to physical limitations have a pharmacologic/chemical stress agent called Lexiscan . This medicine will mimic walking on a treadmill by temporarily increasing your coronary blood flow.   Please note: these test may take anywhere between 2-4 hours to complete  PLEASE REPORT TO [facility] AT THE FIRST DESK WILL DIRECT YOU WHERE TO GO  Date of Procedure:____Friday, November 17_____  Arrival Time for Procedure:_____8:45 am_______  Instructions regarding medication:   __X__ : Hold diabetes medication morning of procedure  How to prepare for your Myoview test:  1. Do not eat or drink after midnight 2. No caffeine for 24 hours prior to test 3. No smoking 24 hours prior to test. 4. Your medication may be taken with water.  If your doctor stopped a medication because of this test, do not take that medication. 5. Ladies, please do not wear dresses.  Skirts or pants are appropriate. Please wear a short sleeve shirt. 6. No perfume, cologne or lotion.  Follow-Up: 1 month  Any Other Special Instructions Will Be Listed Below (If Applicable).     If you need a refill on your cardiac medications before your next  appointment, please call your pharmacy.  Cardiac Nuclear Scanning A cardiac nuclear scan is used to check your heart for problems, such as the following:  A portion of the heart is not getting enough blood.  Part of the heart muscle has died, which happens with a heart attack.  The heart wall is not working normally.  In this test, a radioactive dye (tracer) is injected into your bloodstream. After the tracer has traveled to your heart, a scanning device is used to measure how much of the tracer is absorbed by or distributed to various areas of your heart. LET [facility] CARE PROVIDER KNOW ABOUT:  Any allergies you have.  All medicines you are taking, including vitamins, herbs, eye drops, creams, and over-the-counter medicines.  Previous problems you or members of your family have had with the use of anesthetics.  Any blood disorders you have.  Previous surgeries you have had.  Medical conditions you have.  RISKS AND COMPLICATIONS Generally, this is a safe procedure. However, as with any procedure, problems can occur. Possible problems include:   Serious chest pain.  Rapid heartbeat.  Sensation of warmth in your chest. This usually passes quickly. BEFORE THE PROCEDURE Ask your health care provider about changing or stopping your regular medicines. PROCEDURE This procedure is usually done at a hospital and takes 2-4 hours.  An IV tube is inserted into one of your veins.  Your health care provider will inject a small amount of radioactive tracer through the tube.  You will then wait for 20-40 minutes while the tracer travels through  your bloodstream.  You will lie down on an exam table so images of your heart can be taken. Images will be taken for about 15-20 minutes.  You will exercise on a treadmill or stationary bike. While you exercise, your heart activity will be monitored with an electrocardiogram (ECG), and your blood pressure will be checked.  If you are unable  to exercise, you may be given a medicine to make your heart beat faster.  When blood flow to your heart has peaked, tracer will again be injected through the IV tube.  After 20-40 minutes, you will get back on the exam table and have more images taken of your heart.  When the procedure is over, your IV tube will be removed. AFTER THE PROCEDURE  You will likely be able to leave shortly after the test. Unless your health care provider tells you otherwise, you may return to your normal schedule, including diet, activities, and medicines.  Make sure you find out how and when you will get your test results.   This information is not intended to replace advice given to you by your health care provider. Make sure you discuss any questions you have with your health care provider.   Document Released: 04/05/2004 Document Revised: 03/16/2013 Document Reviewed: 02/17/2013 Elsevier Interactive Patient Education [employer].

## 2016-02-06 DIAGNOSIS — IMO0001 Reserved for inherently not codable concepts without codable children: Secondary | ICD-10-CM | POA: Insufficient documentation

## 2016-02-07 DIAGNOSIS — I5043 Acute on chronic combined systolic (congestive) and diastolic (congestive) heart failure: Secondary | ICD-10-CM | POA: Diagnosis not present

## 2016-02-07 DIAGNOSIS — E1122 Type 2 diabetes mellitus with diabetic chronic kidney disease: Secondary | ICD-10-CM | POA: Diagnosis not present

## 2016-02-07 DIAGNOSIS — J9601 Acute respiratory failure with hypoxia: Secondary | ICD-10-CM | POA: Diagnosis not present

## 2016-02-07 DIAGNOSIS — I130 Hypertensive heart and chronic kidney disease with heart failure and stage 1 through stage 4 chronic kidney disease, or unspecified chronic kidney disease: Secondary | ICD-10-CM | POA: Diagnosis not present

## 2016-02-07 DIAGNOSIS — Z9981 Dependence on supplemental oxygen: Secondary | ICD-10-CM | POA: Diagnosis not present

## 2016-02-07 DIAGNOSIS — D638 Anemia in other chronic diseases classified elsewhere: Secondary | ICD-10-CM | POA: Diagnosis not present

## 2016-02-07 DIAGNOSIS — N183 Chronic kidney disease, stage 3 (moderate): Secondary | ICD-10-CM | POA: Diagnosis not present

## 2016-02-07 DIAGNOSIS — E785 Hyperlipidemia, unspecified: Secondary | ICD-10-CM | POA: Diagnosis not present

## 2016-02-07 DIAGNOSIS — I481 Persistent atrial fibrillation: Secondary | ICD-10-CM | POA: Diagnosis not present

## 2016-02-08 ENCOUNTER — Telehealth: Payer: Self-pay | Admitting: Physician Assistant

## 2016-02-08 ENCOUNTER — Inpatient Hospital Stay: Payer: Commercial Managed Care - HMO

## 2016-02-08 NOTE — Telephone Encounter (Signed)
Patient wife wants to clarify pre procedure instructions.  Can he eat in the morning? Please call.

## 2016-02-08 NOTE — Telephone Encounter (Signed)
Spoke w/ pt's wife. Reviewed myoview instructions w/ her.  She is appreciative and will call back w/ any further questions or concerns.

## 2016-02-09 ENCOUNTER — Ambulatory Visit
Admission: RE | Admit: 2016-02-09 | Discharge: 2016-02-09 | Disposition: A | Discharge status: Home / Self Care | Payer: Commercial Managed Care - HMO | Source: Ambulatory Visit | Attending: Physician Assistant | Admitting: Physician Assistant

## 2016-02-09 DIAGNOSIS — N183 Chronic kidney disease, stage 3 unspecified: Secondary | ICD-10-CM

## 2016-02-09 DIAGNOSIS — I5042 Chronic combined systolic (congestive) and diastolic (congestive) heart failure: Secondary | ICD-10-CM | POA: Insufficient documentation

## 2016-02-09 DIAGNOSIS — D638 Anemia in other chronic diseases classified elsewhere: Secondary | ICD-10-CM | POA: Diagnosis not present

## 2016-02-09 DIAGNOSIS — I110 Hypertensive heart disease with heart failure: Secondary | ICD-10-CM | POA: Diagnosis not present

## 2016-02-09 DIAGNOSIS — R0602 Shortness of breath: Secondary | ICD-10-CM | POA: Diagnosis not present

## 2016-02-09 DIAGNOSIS — I130 Hypertensive heart and chronic kidney disease with heart failure and stage 1 through stage 4 chronic kidney disease, or unspecified chronic kidney disease: Secondary | ICD-10-CM | POA: Diagnosis not present

## 2016-02-09 DIAGNOSIS — I481 Persistent atrial fibrillation: Secondary | ICD-10-CM | POA: Insufficient documentation

## 2016-02-09 DIAGNOSIS — J9621 Acute and chronic respiratory failure with hypoxia: Secondary | ICD-10-CM | POA: Diagnosis not present

## 2016-02-09 DIAGNOSIS — E782 Mixed hyperlipidemia: Secondary | ICD-10-CM | POA: Diagnosis not present

## 2016-02-09 DIAGNOSIS — I4819 Other persistent atrial fibrillation: Secondary | ICD-10-CM

## 2016-02-09 MED ORDER — TECHNETIUM TC 99M TETROFOSMIN IV KIT
31.1300 | PACK | Freq: Once | INTRAVENOUS | Status: AC | PRN
  Administered 2016-02-09: 31.13 via INTRAVENOUS

## 2016-02-09 MED ORDER — TECHNETIUM TC 99M TETROFOSMIN IV KIT
13.0000 | PACK | Freq: Once | INTRAVENOUS | Status: AC | PRN
  Administered 2016-02-09: 11.74 via INTRAVENOUS

## 2016-02-09 MED ORDER — REGADENOSON 0.4 MG/5ML IV SOLN
0.4000 mg | Freq: Once | INTRAVENOUS | Status: AC
  Administered 2016-02-09: 0.4 mg via INTRAVENOUS

## 2016-02-12 DIAGNOSIS — E1122 Type 2 diabetes mellitus with diabetic chronic kidney disease: Secondary | ICD-10-CM | POA: Diagnosis not present

## 2016-02-12 DIAGNOSIS — N183 Chronic kidney disease, stage 3 (moderate): Secondary | ICD-10-CM | POA: Diagnosis not present

## 2016-02-12 DIAGNOSIS — Z9981 Dependence on supplemental oxygen: Secondary | ICD-10-CM | POA: Diagnosis not present

## 2016-02-12 DIAGNOSIS — E785 Hyperlipidemia, unspecified: Secondary | ICD-10-CM | POA: Diagnosis not present

## 2016-02-12 DIAGNOSIS — I481 Persistent atrial fibrillation: Secondary | ICD-10-CM | POA: Diagnosis not present

## 2016-02-12 DIAGNOSIS — J9601 Acute respiratory failure with hypoxia: Secondary | ICD-10-CM | POA: Diagnosis not present

## 2016-02-12 DIAGNOSIS — D638 Anemia in other chronic diseases classified elsewhere: Secondary | ICD-10-CM | POA: Diagnosis not present

## 2016-02-12 DIAGNOSIS — I5043 Acute on chronic combined systolic (congestive) and diastolic (congestive) heart failure: Secondary | ICD-10-CM | POA: Diagnosis not present

## 2016-02-12 DIAGNOSIS — I130 Hypertensive heart and chronic kidney disease with heart failure and stage 1 through stage 4 chronic kidney disease, or unspecified chronic kidney disease: Secondary | ICD-10-CM | POA: Diagnosis not present

## 2016-02-13 ENCOUNTER — Telehealth: Payer: Self-pay | Admitting: Physician Assistant

## 2016-02-13 LAB — NM MYOCAR MULTI W/SPECT W/WALL MOTION / EF
CHL CUP MPHR: 138 {beats}/min
CHL CUP NUCLEAR SRS: 3
CHL CUP NUCLEAR SSS: 4
CHL CUP RESTING HR STRESS: 67 {beats}/min
CSEPPHR: 103 {beats}/min
Estimated workload: 1 METS
Exercise duration (min): 0 min
Exercise duration (sec): 0 s
LV dias vol: 76 mL (ref 62–150)
LVSYSVOL: 39 mL
NUC STRESS TID: 1.16
Percent HR: 74 %
SDS: 0

## 2016-02-13 MED ORDER — TECHNETIUM TC 99M TETROFOSMIN IV KIT
11.7900 | PACK | Freq: Once | INTRAVENOUS | Status: AC | PRN
  Administered 2016-02-13: 11.79 via INTRAVENOUS

## 2016-02-13 NOTE — Telephone Encounter (Signed)
Pt wife would like stress test results. Please call.

## 2016-02-13 NOTE — Telephone Encounter (Signed)
Resulted in epic. Please call nuc med to see if they need to resend so MD can read.

## 2016-02-13 NOTE — Telephone Encounter (Signed)
They closed @ 4. Will call again in the am.

## 2016-02-14 NOTE — Telephone Encounter (Signed)
Please see result note 

## 2016-02-17 ENCOUNTER — Other Ambulatory Visit: Payer: Self-pay | Admitting: Family Medicine

## 2016-02-17 DIAGNOSIS — E041 Nontoxic single thyroid nodule: Secondary | ICD-10-CM

## 2016-02-19 ENCOUNTER — Telehealth: Payer: Self-pay | Admitting: Cardiovascular Disease

## 2016-02-19 DIAGNOSIS — E11649 Type 2 diabetes mellitus with hypoglycemia without coma: Secondary | ICD-10-CM | POA: Diagnosis not present

## 2016-02-19 DIAGNOSIS — Z794 Long term (current) use of insulin: Secondary | ICD-10-CM | POA: Diagnosis not present

## 2016-02-19 NOTE — Telephone Encounter (Signed)
Spoke w/ pt's wife.  Reviewed nuclear stress test results w/ her again.  Advised her that I reviewed these w/ her last week.  She states that pt had nuclear stress test on 11/17, but had further testing on 11/21. Spoke w/ Abby in nuclear medicine-pt's IV leaked on 11/17, so he had to come back for pics on 11/21. Advised pt's wife of results and reason for 2nd visit. She is audibly tearful on my return call, states that pt snaps at her sometimes. Advised her that pts sometimes become emotional in their outbursts. She states that she feels safe at home, her feelings are hurt sometimes when he snaps at her. Asked her to call back if we can provide her w/ further support. She asks for prayers, advised her that I can do this for her.

## 2016-02-19 NOTE — Telephone Encounter (Signed)
Patient wife calling back wanting to know results of test done last Tuesday.  Also she says he gained 1 pound overnight.  Please call

## 2016-02-21 ENCOUNTER — Encounter
Admission: RE | Admit: 2016-02-21 | Discharge: 2016-02-21 | Disposition: A | Discharge status: Home / Self Care | Payer: Commercial Managed Care - HMO | Source: Ambulatory Visit | Attending: Family Medicine | Admitting: Family Medicine

## 2016-02-21 DIAGNOSIS — R911 Solitary pulmonary nodule: Secondary | ICD-10-CM | POA: Insufficient documentation

## 2016-02-21 DIAGNOSIS — E041 Nontoxic single thyroid nodule: Secondary | ICD-10-CM | POA: Diagnosis not present

## 2016-02-21 DIAGNOSIS — E1122 Type 2 diabetes mellitus with diabetic chronic kidney disease: Secondary | ICD-10-CM | POA: Diagnosis not present

## 2016-02-21 DIAGNOSIS — I5043 Acute on chronic combined systolic (congestive) and diastolic (congestive) heart failure: Secondary | ICD-10-CM | POA: Diagnosis not present

## 2016-02-21 DIAGNOSIS — E042 Nontoxic multinodular goiter: Secondary | ICD-10-CM | POA: Diagnosis not present

## 2016-02-21 DIAGNOSIS — I130 Hypertensive heart and chronic kidney disease with heart failure and stage 1 through stage 4 chronic kidney disease, or unspecified chronic kidney disease: Secondary | ICD-10-CM | POA: Diagnosis not present

## 2016-02-21 DIAGNOSIS — N183 Chronic kidney disease, stage 3 (moderate): Secondary | ICD-10-CM | POA: Diagnosis not present

## 2016-02-21 LAB — GLUCOSE, CAPILLARY: GLUCOSE-CAPILLARY: 147 mg/dL — AB (ref 65–99)

## 2016-02-21 MED ORDER — FLUDEOXYGLUCOSE F - 18 (FDG) INJECTION
12.6000 | Freq: Once | INTRAVENOUS | Status: AC | PRN
  Administered 2016-02-21: 12.6 via INTRAVENOUS

## 2016-02-23 DIAGNOSIS — I130 Hypertensive heart and chronic kidney disease with heart failure and stage 1 through stage 4 chronic kidney disease, or unspecified chronic kidney disease: Secondary | ICD-10-CM | POA: Diagnosis not present

## 2016-02-23 DIAGNOSIS — Z9981 Dependence on supplemental oxygen: Secondary | ICD-10-CM | POA: Diagnosis not present

## 2016-02-23 DIAGNOSIS — N183 Chronic kidney disease, stage 3 (moderate): Secondary | ICD-10-CM | POA: Diagnosis not present

## 2016-02-23 DIAGNOSIS — E1122 Type 2 diabetes mellitus with diabetic chronic kidney disease: Secondary | ICD-10-CM | POA: Diagnosis not present

## 2016-02-23 DIAGNOSIS — I481 Persistent atrial fibrillation: Secondary | ICD-10-CM | POA: Diagnosis not present

## 2016-02-23 DIAGNOSIS — D638 Anemia in other chronic diseases classified elsewhere: Secondary | ICD-10-CM | POA: Diagnosis not present

## 2016-02-23 DIAGNOSIS — J9601 Acute respiratory failure with hypoxia: Secondary | ICD-10-CM | POA: Diagnosis not present

## 2016-02-23 DIAGNOSIS — E785 Hyperlipidemia, unspecified: Secondary | ICD-10-CM | POA: Diagnosis not present

## 2016-02-23 DIAGNOSIS — I5043 Acute on chronic combined systolic (congestive) and diastolic (congestive) heart failure: Secondary | ICD-10-CM | POA: Diagnosis not present

## 2016-02-24 DIAGNOSIS — I5042 Chronic combined systolic (congestive) and diastolic (congestive) heart failure: Secondary | ICD-10-CM | POA: Diagnosis not present

## 2016-02-24 DIAGNOSIS — I481 Persistent atrial fibrillation: Secondary | ICD-10-CM | POA: Diagnosis not present

## 2016-02-24 DIAGNOSIS — N183 Chronic kidney disease, stage 3 (moderate): Secondary | ICD-10-CM | POA: Diagnosis not present

## 2016-02-24 DIAGNOSIS — J9601 Acute respiratory failure with hypoxia: Secondary | ICD-10-CM | POA: Diagnosis not present

## 2016-02-26 DIAGNOSIS — Z794 Long term (current) use of insulin: Secondary | ICD-10-CM | POA: Diagnosis not present

## 2016-02-26 DIAGNOSIS — E1122 Type 2 diabetes mellitus with diabetic chronic kidney disease: Secondary | ICD-10-CM | POA: Diagnosis not present

## 2016-02-26 DIAGNOSIS — N183 Chronic kidney disease, stage 3 (moderate): Secondary | ICD-10-CM | POA: Diagnosis not present

## 2016-02-28 ENCOUNTER — Other Ambulatory Visit: Payer: Self-pay | Admitting: *Deleted

## 2016-02-28 DIAGNOSIS — D509 Iron deficiency anemia, unspecified: Secondary | ICD-10-CM

## 2016-02-28 NOTE — Progress Notes (Signed)
[facility]  [phone] [phone]) [phone]  ID: Patrick [name] OB: 08/05/1931  MR#: [id number]  DJT#:[id number]  Patient Care Team: [place] Pink, MD as PCP - General (Family Medicine) Alisa Graff, FNP as Nurse Practitioner (Family Medicine) Rise Mu, PA-C as Physician Assistant (Cardiology) Lloyd Huger, MD as Consulting Physician (Hematology) Lavonia Dana, MD as Consulting Physician (Nephrology) Judi Cong, MD as Physician Assistant (Endocrinology)  CHIEF COMPLAINT: Anemia of chronic disease, lingular mass  INTERVAL HISTORY: Patient returns to clinic today for further evaluation, laboratory work, and discussion of his imaging results. He continues to have increased weakness and fatigue, but states this is improved since discharge. He also continues to have chronic shortness of breath.  He has no neurologic complaints. He denies any weight loss. He denies any recent fevers. He denies any chest pain. He has no nausea, vomiting, constipation, or diarrhea. He has no urinary complaints. Patient offers no further specific complaints.   REVIEW OF SYSTEMS:   Review of Systems  Constitutional: Positive for malaise/fatigue. Negative for fever and weight loss.  Respiratory: Positive for cough.   Cardiovascular: Negative.  Negative for chest pain and leg swelling.  Gastrointestinal: Negative.  Negative for abdominal pain.  Genitourinary: Negative.   Musculoskeletal: Negative.   Neurological: Positive for weakness.  Psychiatric/Behavioral: Negative.  The patient is not nervous/anxious.     As per HPI. Otherwise, a complete review of systems is negative.  PAST MEDICAL HISTORY: Past Medical History:  Diagnosis Date  . Amputated finger   . Anemia of chronic disease    a. plan of oncology to start Procrit - received during admission 12/2013  . Atypical chest pain    a. 12/2014 Neg CE - in setting of L pleural effusion.  . Bell's palsy    . Chronic combined systolic and diastolic CHF (congestive heart failure) ([place])    a. 11/2012 Echo: EF 60-65%, mod conc LVH, mildly dil LA/RA, mild Ao sclerosis w/o stenosis; b. 11/2014 Echo: EF 40-45%, prob mid-apicalanteroseptal, ant, apical HK, Gr2 DD, mod dil LA, mildly dil RA (technically difficult study); c. echo 11/2015: EF 60-65%, trivial AI, nl RV sys fxan, PASP 59; d. echo 10/17: EF 60-65%, no RWMA, mildly dilated LA, mildly reduced RV sys fxn, PASP 70  . CKD (chronic kidney disease), stage III    a. stage III-IV  . Diabetes mellitus without complication ([place])   . GERD (gastroesophageal reflux disease)   . History of gastroesophageal reflux (GERD)   . Hyperlipidemia   . Hypertension   . Permanent atrial fibrillation ([place])    a. Dx 12/2011, Rate-controlled, chronic Xarelto (renal dosing) - CHA2DS2VASc = 5.  . Pleural effusion, left    a. 12/2014 s/p thoracentesis - protein <3, LDH 123, no malignancy.    PAST SURGICAL HISTORY: Past Surgical History:  Procedure Laterality Date  . APPENDECTOMY    . COLONOSCOPY  09/2011  . ESOPHAGOGASTRODUODENOSCOPY    . FINGER SURGERY     right hand    FAMILY HISTORY Family History  Problem Relation Age of Onset  . Heart attack Brother   . Diabetes Mother   . Diabetes Sister   . Hypertension         ADVANCED DIRECTIVES:    HEALTH MAINTENANCE: Social History  Substance Use Topics  . Smoking status: Former Smoker    Packs/day: 0.25    Years: 10.00    Types: Cigars    Quit date: 05/06/1966  . Smokeless tobacco: Never  Used  . Alcohol use No    Allergies  Allergen Reactions  . Levofloxacin Other (See Comments)    confusion  . Prednisone Other (See Comments)    confusion    Current Outpatient Prescriptions  Medication Sig Dispense Refill  . aspirin 81 MG tablet Take 81 mg by mouth daily.    . cloNIDine (CATAPRES) 0.1 MG tablet Take 1 tablet (0.1 mg total) by mouth daily. (Patient taking differently: Take 0.1 mg by mouth 2  (two) times daily. ) 180 tablet 3  . insulin lispro protamine-lispro (HUMALOG MIX 75/25) (75-25) 100 UNIT/ML SUSP injection Inject 50 Units into the skin 2 (two) times daily. Don't take if under 150 10 mL 11  . omeprazole (PRILOSEC) 40 MG capsule Take 1 capsule (40 mg total) by mouth daily. 90 capsule 3  . potassium chloride 20 MEQ TBCR Take 20 mEq by mouth every other day. (Patient taking differently: Take 20 mEq by mouth every other day. Tuesday and Thursday if fluid on board) 30 tablet 1  . pravastatin (PRAVACHOL) 40 MG tablet Take 2 tablets (80 mg total) by mouth daily. Take two tablets daily. (Patient taking differently: Take 80 mg by mouth daily. ) 180 tablet 3  . Rivaroxaban (XARELTO) 15 MG TABS tablet Take 1 tablet (15 mg total) by mouth daily. 30 tablet 3  . torsemide (DEMADEX) 20 MG tablet Take 1 tablet (20 mg total) by mouth every other day. (Patient taking differently: Take 20 mg by mouth every other day. As needed on Tues/Thurs for fluid) 60 tablet 1   No current facility-administered medications for this visit.    Facility-Administered Medications Ordered in Other Visits  Medication Dose Route Frequency Provider Last Rate Last Dose  . epoetin alfa (EPOGEN,PROCRIT) injection 40,000 Units  40,000 Units Subcutaneous Once Lloyd Huger, MD        OBJECTIVE: Vitals:   02/29/16 1431  BP: (!) 149/84  Pulse: 79  Resp: 18  Temp: 98.2 F (36.8 C)     Body mass index is 29.53 kg/m.    ECOG FS:1 - Symptomatic but completely ambulatory  General: Well-developed, well-nourished, no acute distress. Eyes: anicteric sclera. Lungs: Clear to auscultation bilaterally. Heart: Regular rate and rhythm. No rubs, murmurs, or gallops. Abdomen: Soft, nontender, nondistended. No organomegaly noted, normoactive bowel sounds. Musculoskeletal: No edema, cyanosis, or clubbing. Neuro: Alert, answering all questions appropriately. Cranial nerves grossly intact. Skin: No rashes or petechiae  noted. Psych: Normal affect.   LAB RESULTS:  Lab Results  Component Value Date   NA 137 02/05/2016   K 4.3 02/05/2016   CL 100 (L) 02/05/2016   CO2 27 02/05/2016   GLUCOSE 169 (H) 02/05/2016   BUN 30 (H) 02/05/2016   CREATININE 2.53 (H) 02/05/2016   CALCIUM 9.0 02/05/2016   PROT 7.3 12/20/2015   ALBUMIN 3.3 (L) 12/20/2015   AST 26 12/20/2015   ALT 15 (L) 12/20/2015   ALKPHOS 87 12/20/2015   BILITOT 0.9 12/20/2015   GFRNONAA 22 (L) 02/05/2016   GFRAA 26 (L) 02/05/2016    Lab Results  Component Value Date   WBC 7.9 02/29/2016   NEUTROABS 6.5 02/29/2016   HGB 8.0 (L) 02/29/2016   HCT 24.9 (L) 02/29/2016   MCV 81.7 02/29/2016   PLT 272 02/29/2016   Lab Results  Component Value Date   IRON 20 (L) 02/29/2016   TIBC 378 02/29/2016   IRONPCTSAT 5 (L) 02/29/2016    Lab Results  Component Value Date   FERRITIN  56 02/29/2016     STUDIES: US Soft Tissue Head And Neck  Result Date: 02/21/2016 CLINICAL DATA:  Incidental on CT. EXAM: THYROID ULTRASOUND TECHNIQUE: Ultrasound examination of the thyroid gland and adjacent soft tissues was performed. COMPARISON:  CT of the chest on 01/24/2016 FINDINGS: Parenchymal Echotexture: Mildly heterogenous Estimated total number of nodules >/= 1 cm: 2 Number of spongiform nodules >/=  2 cm not described below (TR1): 0 Number of mixed cystic and solid nodules >/= 1.5 cm not described below (TR2): 0 _________________________________________________________ Isthmus: 0.5 cm No discrete nodules are identified within the thyroid isthmus. _________________________________________________________ Right lobe: 4.2 x 1.9 x 1.5 cm Nodule # 1: Location: Right; Mid Size: 0.8 x 0.7 x 0.6 cm Composition: solid/almost completely solid (2) Echogenicity: hyperechoic (1) Shape: taller-than-wide (3) Margins: smooth (0) Echogenic foci: none (0) ACR TI-RADS total points: 6. ACR TI-RADS risk category: TR4 (4-6 points). ACR TI-RADS recommendations: Given size (<0.9  cm) and appearance, this nodule does NOT meet TI-RADS criteria for biopsy or dedicated follow-up. _________________________________________________________ Left lobe: 4.3 x 2.4 x 2.3 cm Nodule # 1: Location: Left; Mid Size: 2.2 x 1.5 x 1.3 cm Composition: solid/almost completely solid (2) Echogenicity: hyperechoic (1) Shape: taller-than-wide (3) Margins: ill-defined (0) Echogenic foci: none (0) ACR TI-RADS total points: 6. ACR TI-RADS risk category: TR4 (4-6 points). ACR TI-RADS recommendations: **Given size (>/= 1.5 cm) and appearance, fine needle aspiration of this moderately suspicious nodule should be considered based on TI-RADS criteria. Nodule # 2: Location: Left; Superior Size: 1.2 x 0.9 x 0.9 cm Composition: solid/almost completely solid (2) Echogenicity: hyperechoic (1) Shape: not taller-than-wide (0) Margins: smooth (0) Echogenic foci: none (0) ACR TI-RADS total points: 3. ACR TI-RADS risk category: TR3 (3 points). ACR TI-RADS recommendations: Given size (<1.4 cm) and appearance, this nodule does NOT meet TI-RADS criteria for biopsy or dedicated follow-up. Nodule # 3: Location: Left; Inferior Size: 0.8 x 0.6 x 0.7 cm Composition: solid/almost completely solid (2) Echogenicity: hyperechoic (1) Shape: not taller-than-wide (0) Margins: smooth (0) Echogenic foci: none (0) ACR TI-RADS total points: 3. ACR TI-RADS risk category: TR3 (3 points). ACR TI-RADS recommendations: Given size (<1.4 cm) and appearance, this nodule does NOT meet TI-RADS criteria for biopsy or dedicated follow-up. IMPRESSION: The 2.2 cm solid, mid left lobe, hyperechoic nodule meets TI-RADS criteria for fine needle aspirate biopsy. Other small solid hyperechoic nodules bilaterally as outlined above do not meet criteria for biopsy or further imaging follow-up. The above is in keeping with the ACR TI-RADS recommendations - J Am Coll Radiol 2017;14:587-595. Electronically Signed   By: Aletta Edouard M.D.   On: 02/21/2016 09:31   Nm Myocar  Multi W/spect W/wall Motion / Ef  Result Date: 02/13/2016 Pharmacological myocardial perfusion imaging study with no significant  Ischemia Small fixed apical perfusion defect noted, likely secondary to attenuation artifact Normal wall motion, EF estimated at 57% No EKG changes concerning for ischemia at peak stress or in recovery. Low risk scan Signed, Esmond Plants, MD, Ph.D [facility]   Nm Pet Image Initial (pi) Skull Base To Thigh  Result Date: 02/21/2016 CLINICAL DATA:  Initial treatment strategy for left lung nodule and thoracic lymphadenopathy. EXAM: NUCLEAR MEDICINE PET SKULL BASE TO THIGH TECHNIQUE: 12.6 mCi F-18 FDG was injected intravenously. Full-ring PET imaging was performed from the skull base to thigh after the radiotracer. CT data was obtained and used for attenuation correction and anatomic localization. FASTING BLOOD GLUCOSE:  Value: 147 mg/dl COMPARISON:  Chest CT on 01/24/2016 and 12/22/2014  FINDINGS: NECK No hypermetabolic lymph nodes in the neck. CHEST Diffuse mediastinal lymphadenopathy shows mild progression compared to previous studies dating back to 12/22/2014. Index lymph node in the right paratracheal region measures 2.2 cm short axis on image 81/3 compared to 1.8 cm on 2016 exam. Index subcarinal lymph node measures 2.1 cm on image 98/3 compared to 1.8 cm previously. This mediastinal lymphadenopathy shows hypermetabolic activity, with index lymph node in the right paratracheal region with SUV max of 5.0. There is also mild asymmetric hypermetabolic activity within the left hilum, with SUV max of 4.9. Small bilateral thyroid nodules, largest in the left lobe measuring 1.8 cm, remains stable and demonstrate absence of FDG uptake. Bibasilar pleural- parenchymal scarring is again demonstrated. Small loculated pleural effusion noted within the right major fissure. Rounded atelectasis in posterior left lower lobe appears stable. 1.8 cm pleural-based nodule in the inferior lingula on  image 116/3 shows no hypermetabolic activity, also most consistent with rounded atelectasis. No suspicious pulmonary nodules or masses identified by CT. ABDOMEN/PELVIS No abnormal hypermetabolic activity within the liver, pancreas, adrenal glands, or spleen. No hypermetabolic lymph nodes in the abdomen or pelvis. No evidence of splenomegaly.  Aortic atherosclerosis. SKELETON No focal hypermetabolic activity to suggest skeletal metastasis. IMPRESSION: Diffuse mediastinal lymphadenopathy shows hypermetabolic activity and minimal progression since 12/22/2014 exam. Mild hypermetabolic nodal activity in left hilum. Differential diagnosis includes inflammatory etiologies and chronic lymphocytic leukemia/ lymphoma. Bilateral pleural- parenchymal scarring in left lower lobe rounded atelectasis. No suspicious hypermetabolic pulmonary nodules or masses identified. No evidence of hypermetabolic lymphadenopathy within the neck, abdomen, or pelvis. Electronically Signed   By: Earle Gell M.D.   On: 02/21/2016 14:18    ASSESSMENT: Anemia of chronic disease  PLAN:    1. Anemia of chronic disease: Patient's hemoglobin and iron stores have trended down, therefore will proceed with 510 mg IV Feraheme today. Will hold Procrit for now until patient's iron stores have been repleted. Will give a second infusion of Feraheme at patient's next clinic visit which will occur approximately one week after his evaluation by pulmonary.  2. Lingula mass: PET scan results reviewed independently and reported as above. Regular mass with PET negative, but PET scan did note diffuse mediastinal lymphadenopathy concerning for underlying lymphoma. Peripheral blood flow cytometry is pending. Referral to pulmonary for consideration of bronchoscopy and biopsy. 3. Chronic renal insufficiency: Creatinine slightly worse, treatment per nephrology. 4. Possible lymphoma: Peripheral blood flow cytometry is pending, referral to pulmonary as  above.  Patient expressed understanding and was in agreement with this plan. He also understands that He can call clinic at any time with any questions, concerns, or complaints.    Lloyd Huger, MD   03/03/2016 9:58 AM

## 2016-02-29 ENCOUNTER — Other Ambulatory Visit: Payer: Self-pay

## 2016-02-29 ENCOUNTER — Inpatient Hospital Stay: Payer: Commercial Managed Care - HMO

## 2016-02-29 ENCOUNTER — Inpatient Hospital Stay (HOSPITAL_BASED_OUTPATIENT_CLINIC_OR_DEPARTMENT_OTHER): Payer: Commercial Managed Care - HMO | Admitting: Oncology

## 2016-02-29 ENCOUNTER — Inpatient Hospital Stay: Payer: Commercial Managed Care - HMO | Attending: Oncology

## 2016-02-29 VITALS — BP 149/84 | HR 79 | Temp 98.2°F | Resp 18 | Wt 194.2 lb

## 2016-02-29 DIAGNOSIS — N183 Chronic kidney disease, stage 3 unspecified: Secondary | ICD-10-CM

## 2016-02-29 DIAGNOSIS — E785 Hyperlipidemia, unspecified: Secondary | ICD-10-CM | POA: Diagnosis not present

## 2016-02-29 DIAGNOSIS — Z7901 Long term (current) use of anticoagulants: Secondary | ICD-10-CM

## 2016-02-29 DIAGNOSIS — I482 Chronic atrial fibrillation: Secondary | ICD-10-CM

## 2016-02-29 DIAGNOSIS — Z794 Long term (current) use of insulin: Secondary | ICD-10-CM | POA: Insufficient documentation

## 2016-02-29 DIAGNOSIS — I5042 Chronic combined systolic (congestive) and diastolic (congestive) heart failure: Secondary | ICD-10-CM | POA: Diagnosis not present

## 2016-02-29 DIAGNOSIS — Z79899 Other long term (current) drug therapy: Secondary | ICD-10-CM | POA: Diagnosis not present

## 2016-02-29 DIAGNOSIS — D638 Anemia in other chronic diseases classified elsewhere: Secondary | ICD-10-CM

## 2016-02-29 DIAGNOSIS — I130 Hypertensive heart and chronic kidney disease with heart failure and stage 1 through stage 4 chronic kidney disease, or unspecified chronic kidney disease: Secondary | ICD-10-CM

## 2016-02-29 DIAGNOSIS — E1122 Type 2 diabetes mellitus with diabetic chronic kidney disease: Secondary | ICD-10-CM | POA: Insufficient documentation

## 2016-02-29 DIAGNOSIS — K219 Gastro-esophageal reflux disease without esophagitis: Secondary | ICD-10-CM | POA: Insufficient documentation

## 2016-02-29 DIAGNOSIS — D509 Iron deficiency anemia, unspecified: Secondary | ICD-10-CM

## 2016-02-29 DIAGNOSIS — Z87891 Personal history of nicotine dependence: Secondary | ICD-10-CM | POA: Diagnosis not present

## 2016-02-29 DIAGNOSIS — R590 Localized enlarged lymph nodes: Secondary | ICD-10-CM

## 2016-02-29 DIAGNOSIS — IMO0001 Reserved for inherently not codable concepts without codable children: Secondary | ICD-10-CM

## 2016-02-29 DIAGNOSIS — Z7982 Long term (current) use of aspirin: Secondary | ICD-10-CM | POA: Insufficient documentation

## 2016-02-29 LAB — CBC WITH DIFFERENTIAL/PLATELET
Basophils Absolute: 0.1 10*3/uL (ref 0–0.1)
Basophils Relative: 1 %
EOS PCT: 3 %
Eosinophils Absolute: 0.3 10*3/uL (ref 0–0.7)
HEMATOCRIT: 24.9 % — AB (ref 40.0–52.0)
Hemoglobin: 8 g/dL — ABNORMAL LOW (ref 13.0–18.0)
LYMPHS ABS: 0.6 10*3/uL — AB (ref 1.0–3.6)
LYMPHS PCT: 8 %
MCH: 26.1 pg (ref 26.0–34.0)
MCHC: 32 g/dL (ref 32.0–36.0)
MCV: 81.7 fL (ref 80.0–100.0)
MONO ABS: 0.4 10*3/uL (ref 0.2–1.0)
Monocytes Relative: 5 %
Neutro Abs: 6.5 10*3/uL (ref 1.4–6.5)
Neutrophils Relative %: 83 %
PLATELETS: 272 10*3/uL (ref 150–440)
RBC: 3.05 MIL/uL — ABNORMAL LOW (ref 4.40–5.90)
RDW: 16.7 % — AB (ref 11.5–14.5)
WBC: 7.9 10*3/uL (ref 3.8–10.6)

## 2016-02-29 LAB — FERRITIN: Ferritin: 56 ng/mL (ref 24–336)

## 2016-02-29 LAB — IRON AND TIBC
Iron: 20 ug/dL — ABNORMAL LOW (ref 45–182)
SATURATION RATIOS: 5 % — AB (ref 17.9–39.5)
TIBC: 378 ug/dL (ref 250–450)
UIBC: 358 ug/dL

## 2016-02-29 MED ORDER — SODIUM CHLORIDE 0.9 % IV SOLN
Freq: Once | INTRAVENOUS | Status: AC
  Administered 2016-02-29: 16:00:00 via INTRAVENOUS
  Filled 2016-02-29: qty 1000

## 2016-02-29 MED ORDER — SODIUM CHLORIDE 0.9 % IV SOLN
510.0000 mg | Freq: Once | INTRAVENOUS | Status: AC
  Administered 2016-02-29: 510 mg via INTRAVENOUS
  Filled 2016-02-29: qty 17

## 2016-02-29 MED ORDER — EPOETIN ALFA 40000 UNIT/ML IJ SOLN: 40000.0000 [IU] | Freq: Once | INTRAMUSCULAR | Status: DC

## 2016-02-29 NOTE — Progress Notes (Signed)
Offers no complaints  

## 2016-03-04 ENCOUNTER — Encounter: Payer: Self-pay | Admitting: Cardiovascular Disease

## 2016-03-04 ENCOUNTER — Ambulatory Visit (INDEPENDENT_AMBULATORY_CARE_PROVIDER_SITE_OTHER): Payer: Commercial Managed Care - HMO | Admitting: Cardiovascular Disease

## 2016-03-04 VITALS — BP 140/70 | HR 68 | Ht 68.0 in | Wt 192.5 lb

## 2016-03-04 DIAGNOSIS — I10 Essential (primary) hypertension: Secondary | ICD-10-CM

## 2016-03-04 DIAGNOSIS — I4819 Other persistent atrial fibrillation: Secondary | ICD-10-CM

## 2016-03-04 DIAGNOSIS — R591 Generalized enlarged lymph nodes: Secondary | ICD-10-CM

## 2016-03-04 DIAGNOSIS — N183 Chronic kidney disease, stage 3 unspecified: Secondary | ICD-10-CM

## 2016-03-04 DIAGNOSIS — E782 Mixed hyperlipidemia: Secondary | ICD-10-CM

## 2016-03-04 DIAGNOSIS — Z794 Long term (current) use of insulin: Secondary | ICD-10-CM

## 2016-03-04 DIAGNOSIS — I5032 Chronic diastolic (congestive) heart failure: Secondary | ICD-10-CM | POA: Diagnosis not present

## 2016-03-04 DIAGNOSIS — I481 Persistent atrial fibrillation: Secondary | ICD-10-CM | POA: Diagnosis not present

## 2016-03-04 DIAGNOSIS — E1159 Type 2 diabetes mellitus with other circulatory complications: Secondary | ICD-10-CM

## 2016-03-04 DIAGNOSIS — D638 Anemia in other chronic diseases classified elsewhere: Secondary | ICD-10-CM

## 2016-03-04 NOTE — Progress Notes (Signed)
Cardiology Office Note  Date:  03/04/2016   ID:  Patrick [name], DOB June 13, 1931, MRN [id number]  PCP:  [place] Pink, MD   Chief Complaint  Patient presents with  . other    73mo f/u. post myoview/cardiac nuclear scanning. Pt states Patrick [name] is doing well. Reviewed meds with pt verbally.    HPI:  Patrick [name] is a very pleasant 82 year old gentleman with history of hypertension, COPD,  diabetes, hyperlipidemia, GERD who presented to the hospital December 30 2011 with weakness, dizziness, found to be in atrial fibrillation with RVR,  started on rate controlling medications and discharged on Cardizem 300 mg daily with anticoagulation (xarelto). Patrick [name] has significant lower extremity edema with Cardizem and this was weaned off within increase in his beta blocker . Patrick [name] presents for routine followup of his atrial fibrillation Previous diagnosis of Bell's palsy  Hospital admission 01/03/2014 with shortness of breath, diastolic CHF, pleural effusions, anemia, responding well to diuresis Patrick [name] presents today for follow-up of his atrial fibrillation  02/09/2016 Stress test with no ischemia EF 57%   admitted in September 2017 with acute on chronic diastolic CHF.  Echo at that time showed normal EF with PASP 59 mmHg.  Patrick [name] was diuresed and sent home on torsemide every other day.   On 01/22/16 Patrick [name] developed sudden onset of SOB and presented to [facility] where Patrick [name] was found to have acute on chronic respiratory failure felt to be multifactorial including some component of CHF exacerbation and worsening anemia with a 3 gram drop from 11.3 during prior admission to 8.6   echo showed normal LV systolic function with an EF of 60-65%, nmo RWMA, mildly dilated left atrium, mildly increased RV wall thickness with mild reduction in systolic function, PASP increased at 70 mmHg.  CT chest showed underlying emphysema, pulmonary nodule measuring 1.8 x1.1 cm extensive atherosclerotic calcification, ascending aorta measuring  4.1x37 cm   Patrick [name]  is taking torsemide 20 mg prn weight gain  Recent baseline weight 189 pounds up to 191 pounds  EKG on today's visit shows atrial fibrillation with ventricular rate 68 bpm, no significant ST or T-wave changes   other past medical history  Seen by CHF clinic on 01/12/2014. Note was reviewed. No new medication changes at that time  presented to [facility] on 10/12 with increased DOE. pBNP was found to be 2708. CXR showed mild CHF with bilateral pleural effusions. Patrick [name] was diuresed with IV Lasix 40 mg BID with good response. Patrick [name] does have a narrow euvolemic window 2/2 his CKD. Patrick [name] was restarted back on his home dose of torsemide 20 mg bid with double dose prn weight gain. H/H has been drifting down over the past year, has been seen by Oncology in the past with a plan for Procrit once hgb <10. Procrit and iron were given this past admission.    Previously not interested in cardioversion  Patrick [name] has been tolerating anticoagulation at renal dosing.  CT scan of the head in the hospital showed no intracranial process Lab work October 2013, creatinine 2.2, BUN 34 Lab work August 2013 , Hemoglobin A1c 8.1 Total cholesterol 97, LDL 48 creatinine 2.2, BUN in the 40s which is higher than his baseline BUN in the 20s. Wife reports Patrick [name] does not drink very much  Echocardiogram showed ejection fraction greater than 55%, mildly dilated left atrium, otherwise normal study MRI of the brain showed chronic changes without evidence of acute abnormalities   PMH:   has a past medical history of Amputated finger; Anemia  of chronic disease; Atypical chest pain; Bell's palsy; Chronic combined systolic and diastolic CHF (congestive heart failure) ([place]); CKD (chronic kidney disease), stage III; Diabetes mellitus without complication ([place]); GERD (gastroesophageal reflux disease); History of gastroesophageal reflux (GERD); Hyperlipidemia; Hypertension; Permanent atrial fibrillation ([place]); and Pleural effusion,  left.  PSH:    Past Surgical History:  Procedure Laterality Date  . APPENDECTOMY    . COLONOSCOPY  09/2011  . ESOPHAGOGASTRODUODENOSCOPY    . FINGER SURGERY     right hand    Current Outpatient Prescriptions  Medication Sig Dispense Refill  . aspirin 81 MG tablet Take 81 mg by mouth daily.    . cloNIDine (CATAPRES) 0.1 MG tablet Take 1 tablet (0.1 mg total) by mouth daily. (Patient taking differently: Take 0.1 mg by mouth 2 (two) times daily. ) 180 tablet 3  . insulin lispro protamine-lispro (HUMALOG MIX 75/25) (75-25) 100 UNIT/ML SUSP injection Inject 50 Units into the skin 2 (two) times daily. Don't take if under 150 10 mL 11  . omeprazole (PRILOSEC) 40 MG capsule Take 1 capsule (40 mg total) by mouth daily. 90 capsule 3  . potassium chloride 20 MEQ TBCR Take 20 mEq by mouth every other day. (Patient taking differently: Take 20 mEq by mouth every other day. Tuesday and Thursday if fluid on board) 30 tablet 1  . pravastatin (PRAVACHOL) 40 MG tablet Take 2 tablets (80 mg total) by mouth daily. Take two tablets daily. (Patient taking differently: Take 80 mg by mouth daily. ) 180 tablet 3  . Rivaroxaban (XARELTO) 15 MG TABS tablet Take 1 tablet (15 mg total) by mouth daily. 30 tablet 3  . torsemide (DEMADEX) 20 MG tablet Take 1 tablet (20 mg total) by mouth every other day. (Patient taking differently: Take 20 mg by mouth every other day. As needed on Tues/Thurs for fluid) 60 tablet 1   No current facility-administered medications for this visit.    Facility-Administered Medications Ordered in Other Visits  Medication Dose Route Frequency Provider Last Rate Last Dose  . epoetin alfa (EPOGEN,PROCRIT) injection 40,000 Units  40,000 Units Subcutaneous Once Lloyd Huger, MD         Allergies:   Levofloxacin and Prednisone   Social History:  The patient  reports that Patrick [name] quit smoking about 49 years ago. His smoking use included Cigars. Patrick [name] has a 2.50 pack-year smoking history. Patrick [name] has  never used smokeless tobacco. Patrick [name] reports that Patrick [name] does not drink alcohol or use drugs.   Family History:   family history includes Diabetes in his mother and sister; Heart attack in his brother.    Review of Systems: Review of Systems  Constitutional: Positive for malaise/fatigue.  Respiratory: Positive for shortness of breath.   Cardiovascular: Positive for leg swelling.  Gastrointestinal: Negative.   Musculoskeletal: Negative.   Neurological: Negative.   Psychiatric/Behavioral: Negative.   All other systems reviewed and are negative.    PHYSICAL EXAM: VS:  BP 140/70 (BP Location: Left Arm, Patient Position: Sitting, Cuff Size: Normal)   Pulse 68   Ht 5\' 8"  (1.727 m)   Wt 192 lb 8 oz (87.3 kg)   BMI 29.27 kg/m  , BMI Body mass index is 29.27 kg/m. GEN: Well nourished, well developed, in no acute distress  HEENT: normal  Neck: no JVD, carotid bruits, or masses Cardiac: RRR; no murmurs, rubs, or gallops,no edema  Respiratory:  clear to auscultation bilaterally, normal work of breathing GI: soft, nontender, nondistended, + BS MS: no deformity or  atrophy  Skin: warm and dry, no rash Neuro:  Strength and sensation are intact Psych: euthymic mood, full affect    Recent Labs: 12/20/2015: ALT 15 12/21/2015: TSH 0.859 01/22/2016: B Natriuretic Peptide 508.0; Magnesium 2.1 02/05/2016: BUN 30; Creatinine, Ser 2.53; Potassium 4.3; Sodium 137 02/29/2016: Hemoglobin 8.0; Platelets 272    Lipid Panel Lab Results  Component Value Date   CHOL 81 01/03/2014   HDL 16 (L) 01/03/2014   LDLCALC 43 01/03/2014   TRIG 111 01/03/2014      Wt Readings from Last 3 Encounters:  03/04/16 192 lb 8 oz (87.3 kg)  02/29/16 194 lb 3.6 oz (88.1 kg)  02/05/16 189 lb 0.7 oz (85.7 kg)       ASSESSMENT AND PLAN:  Persistent atrial fibrillation (HCC) - Plan: EKG 12-Lead Rate well controlled, tolerating anticoagulation  Chronic diastolic heart failure (HCC) Maintaining baseline weight  taking Lasix as needed for any weight gain Worsening shortness of breath secondary to anemia Recent iron infusion Would likely do better with hemoglobin greater than 10 Recent drop to 8  Essential hypertension Initial blood pressure elevated, recheck 683 systolic No medication changes made  Mixed hyperlipidemia Encouraged him to continue on pravastatin  Type 2 diabetes mellitus with other circulatory complication, with long-term current use of insulin (HCC)  Anemia of chronic disease Iron deficiency anemia, recently had iron infusion Current Level will affect his breathing, energy and risk for recurrent CHF  CKD (chronic kidney disease), stage III Creatinine 2.53, high end of his recent range  Lymphadenopathy Long discussion with him, recently seen by Dr. Grayland Ormond. Refer to pulmonary for possible bronchoscopy, biopsy of nodes Recommended Patrick [name] discuss risk and benefit of procedure with pulmonary Family indicating they're not particularly interested in treatment of CLL if aggressive chemotherapy needed  Long discussion of above with patient's daughter, wife in the room today  Total encounter time more than 45 minutes  Greater than 50% was spent in counseling and coordination of care with the patient   Disposition:   F/U  6 months   Orders Placed This Encounter  Procedures  . EKG 12-Lead     Signed, Esmond Plants, M.D., Ph.D. 03/04/2016  [place], [place]

## 2016-03-04 NOTE — Patient Instructions (Signed)

## 2016-03-05 ENCOUNTER — Ambulatory Visit (INDEPENDENT_AMBULATORY_CARE_PROVIDER_SITE_OTHER): Payer: Commercial Managed Care - HMO | Admitting: Internal Medicine

## 2016-03-05 ENCOUNTER — Encounter: Payer: Self-pay | Admitting: Internal Medicine

## 2016-03-05 VITALS — BP 128/80 | HR 89 | Ht 68.0 in | Wt 193.0 lb

## 2016-03-05 DIAGNOSIS — J449 Chronic obstructive pulmonary disease, unspecified: Secondary | ICD-10-CM | POA: Diagnosis not present

## 2016-03-05 DIAGNOSIS — J9611 Chronic respiratory failure with hypoxia: Secondary | ICD-10-CM

## 2016-03-05 MED ORDER — BUDESONIDE 0.25 MG/2ML IN SUSP: 0.2500 mg | Freq: Two times a day (BID) | RESPIRATORY_TRACT | 3 refills | Status: DC

## 2016-03-05 MED ORDER — FORMOTEROL FUMARATE 20 MCG/2ML IN NEBU: 20.0000 ug | INHALATION_SOLUTION | Freq: Two times a day (BID) | RESPIRATORY_TRACT | 3 refills | Status: DC

## 2016-03-05 NOTE — Addendum Note (Signed)
Addended by: Maryanna Shape A on: 03/05/2016 10:26 AM   Modules accepted: Orders

## 2016-03-05 NOTE — Patient Instructions (Signed)
Start PULMICORT NEBS twcie daily Peformist NEBS twcie daily Oxygen as needed Follow up CT chest in 1 year, can do if patients wants sooner

## 2016-03-05 NOTE — Progress Notes (Signed)
* [facility]       Date: 03/05/2016  MRN# [id number] Patrick [name] April 13, 1931   Patrick GARLITZ is a 82 y.o. old male seen in follow up for chief complaint of  Chief Complaint  Patient presents with  . pulmonary consult    per Dr. Grayland Ormond.    Patient with Mediastinal adenopathy  HPI:  The patient is a 82 year old male who was seen in the hospital with congestive heart failure and atypical left-sided pleural effusion approx 1 year ago by Dr. Ashby Dawes.  1 year ago,  Patient subsequently underwent a thoracentesis with removal of 1.3 L which was diagnostic and therapeutic in nature, and asked to follow up for results. He also has a history of diastolic congestive heart failure as well as some systolic dysfunction with an ejection fraction of 45-50%. He has chronic atrial fibrillation and essential hypertension.   Review of testing. -Echocardiogram 12/23/2014 which showed ejection fraction of 45 to 50%. -CT chest 12/22/2014 showed a moderate left-sided pleural effusion with compressive atelectasis. Also left chronic vertebral artery occlusion. -Review of the thoracentesis performed on 12/26/2014 showed a pleural fluid LDH of 123 and protein less than 3. Serum protein was 6.2. Cytology and flow time cytometry were negative, the fluid was predominantly lymphocytic, total of 1.3 L was removed.   PETscan 02/21/16 images reviewed 03/05/2016 Diffuse mediastinal lymphadenopathy shows hypermetabolic activity and minimal progression since 12/22/2014 exam. Mild hypermetabolic nodal activity in left hilum.    Pulmonary Functions Testing Results: Severe Restrictive lung disease FVC47% precited FVC1.6L Fev1 61% ratio 91%   The Risks and Benefits of the Bronchoscopy with EBUS were explained to patient/family and I have discussed the risk for acute bleeding, increased chance of infection, increased chance of respiratory failure and cardiac arrest and  death. I have also explained to avoid all types of NSAIDs to decrease chance of bleeding, and to avoid food and drinks the midnight prior to procedure.  The procedure consists of a video camera with a light source to be placed and inserted  into the lungs to  look for abnormal tissue and to obtain tissue samples by using needle and biopsy tools.  The patient/family understand the risks and benefits and have NOT agreed to proceed with procedure. Patient has decided not to undergo procedure and does NOT want chemo or RXT.   No signs of infection at this time Has Lower ext edmea Has CHF and CKD Overall, prognosis is poor, patient has very poor resp effort, on oxygen therapy Very frail and weak   Medication:   Outpatient Encounter Prescriptions as of 03/05/2016  Medication Sig  . aspirin 81 MG tablet Take 81 mg by mouth daily.  . cloNIDine (CATAPRES) 0.1 MG tablet Take 1 tablet (0.1 mg total) by mouth daily. (Patient taking differently: Take 0.1 mg by mouth 2 (two) times daily. )  . insulin lispro protamine-lispro (HUMALOG MIX 75/25) (75-25) 100 UNIT/ML SUSP injection Inject 50 Units into the skin 2 (two) times daily. Don't take if under 150  . omeprazole (PRILOSEC) 40 MG capsule Take 1 capsule (40 mg total) by mouth daily.  . potassium chloride 20 MEQ TBCR Take 20 mEq by mouth every other day. (Patient taking differently: Take 20 mEq by mouth every other day. Tuesday and Thursday if fluid on board)  . pravastatin (PRAVACHOL) 40 MG tablet Take 2 tablets (80 mg total) by mouth daily. Take two tablets daily. (Patient taking differently: Take 80 mg by mouth daily. )  .  Rivaroxaban (XARELTO) 15 MG TABS tablet Take 1 tablet (15 mg total) by mouth daily.  Marland Kitchen torsemide (DEMADEX) 20 MG tablet Take 1 tablet (20 mg total) by mouth every other day. (Patient taking differently: Take 20 mg by mouth every other day. As needed on Tues/Thurs for fluid)   Facility-Administered Encounter Medications as of  03/05/2016  Medication  . epoetin alfa (EPOGEN,PROCRIT) injection 40,000 Units     Allergies:  Levofloxacin and Prednisone  Review of Systems: Gen:  Denies  fever, sweats. HEENT: Denies blurred vision. Cvc:  No dizziness, chest pain or heaviness Resp:  +SOB and WOB Gi: Denies swallowing difficulty, stomach pain. constipation, bowel incontinence Gu:  Denies bladder incontinence, burning urine Ext:   No Joint pain, stiffness. Skin: No skin rash, easy bruising. Endoc:  No polyuria, polydipsia. Psych: No depression, insomnia. Other:  All other systems were reviewed and found to be negative other than what is mentioned in the HPI.   Physical Examination:   BP 128/80 (BP Location: Left Arm, Cuff Size: Normal)   Pulse 89   Ht 5\' 8"  (1.727 m)   Wt 193 lb (87.5 kg)   SpO2 90%   BMI 29.35 kg/m    General Appearance: No distress  Neuro:without focal findings,  speech normal,  HEENT: PERRLA, EOM intact. Pulmonary: +rhonchi and intermittent wheezing CardiovascularNormal S1,S2.  No m/r/g.   Abdomen: Benign, Soft, non-tender. Renal:  No costovertebral tenderness  GU:  Not performed at this time. Endoc: No evident thyromegaly, no signs of acromegaly. Skin:   warm, no rash. Extremities: normal, no cyanosis, clubbing. +edema   LABORATORY PANEL:   CBC  Recent Labs Lab 02/29/16 1320  WBC 7.9  HGB 8.0*  HCT 24.9*  PLT 272   ------------------------------------------------------------------------------------------------------------------   -  Assessment and Plan:   82 yo white male with multiple medical issues including chornic hypoxic resp failure from multiple comorbidities inclduing CHF, CKF, Restrictive lung disease and former smoker in the setting of Mediastinal Adenopathy-likely inflammatory    Overall, prognosis is very poor, poor resp effort and hypoxia with Systolic congestive heart failure. -Ejection fraction of 45-50%.  AT this time, we will defer any type  of procedure at this time due to moderate to high risk for pulmonary and cardiac complications as per patient request.  1.will start Pulmicort nebs and Pefromsit Nebs 2.oxygen as needed  Follow up in 1 month for assessment of resp status  The Patient requires high complexity decision making for assessment and support, frequent evaluation and titration of therapies.  Patient/Family are satisfied with Plan of action and management. All questions answered  Corrin Parker, M.D.  [facility]  Medical Director [facility] Director [facility] Cardio-Pulmonary Department

## 2016-03-06 ENCOUNTER — Telehealth: Payer: Self-pay | Admitting: Internal Medicine

## 2016-03-06 DIAGNOSIS — J449 Chronic obstructive pulmonary disease, unspecified: Secondary | ICD-10-CM | POA: Diagnosis not present

## 2016-03-06 DIAGNOSIS — J9611 Chronic respiratory failure with hypoxia: Secondary | ICD-10-CM | POA: Diagnosis not present

## 2016-03-06 MED ORDER — FORMOTEROL FUMARATE 20 MCG/2ML IN NEBU: 20.0000 ug | INHALATION_SOLUTION | Freq: Two times a day (BID) | RESPIRATORY_TRACT | 3 refills | Status: DC

## 2016-03-06 MED ORDER — BUDESONIDE 0.25 MG/2ML IN SUSP: 0.2500 mg | Freq: Two times a day (BID) | RESPIRATORY_TRACT | 3 refills | Status: DC

## 2016-03-06 NOTE — Telephone Encounter (Signed)
Pt wife calling stating we sent in the new prescriptions to somewhere new They need it to be called into [facility]  Please advise

## 2016-03-06 NOTE — Telephone Encounter (Signed)
Called and spoke with pts wife and she stated that the medication that was sent in yesterday is very expensive and they cannot afford this;. They requested that this be sent to the [facility] and they will see how much it is there.  She will call back if needed.

## 2016-03-11 ENCOUNTER — Ambulatory Visit: Payer: Commercial Managed Care - HMO | Admitting: Family

## 2016-03-12 ENCOUNTER — Telehealth: Payer: Self-pay | Admitting: Cardiovascular Disease

## 2016-03-12 ENCOUNTER — Ambulatory Visit: Payer: Commercial Managed Care - HMO | Attending: Family | Admitting: Family

## 2016-03-12 ENCOUNTER — Encounter: Payer: Self-pay | Admitting: Family

## 2016-03-12 VITALS — BP 161/73 | HR 85 | Resp 18 | Ht 67.0 in | Wt 197.0 lb

## 2016-03-12 DIAGNOSIS — I130 Hypertensive heart and chronic kidney disease with heart failure and stage 1 through stage 4 chronic kidney disease, or unspecified chronic kidney disease: Secondary | ICD-10-CM | POA: Diagnosis not present

## 2016-03-12 DIAGNOSIS — Z9889 Other specified postprocedural states: Secondary | ICD-10-CM | POA: Diagnosis not present

## 2016-03-12 DIAGNOSIS — Z794 Long term (current) use of insulin: Secondary | ICD-10-CM | POA: Insufficient documentation

## 2016-03-12 DIAGNOSIS — E1122 Type 2 diabetes mellitus with diabetic chronic kidney disease: Secondary | ICD-10-CM | POA: Insufficient documentation

## 2016-03-12 DIAGNOSIS — I482 Chronic atrial fibrillation: Secondary | ICD-10-CM | POA: Insufficient documentation

## 2016-03-12 DIAGNOSIS — I5032 Chronic diastolic (congestive) heart failure: Secondary | ICD-10-CM

## 2016-03-12 DIAGNOSIS — Z8249 Family history of ischemic heart disease and other diseases of the circulatory system: Secondary | ICD-10-CM | POA: Diagnosis not present

## 2016-03-12 DIAGNOSIS — I10 Essential (primary) hypertension: Secondary | ICD-10-CM

## 2016-03-12 DIAGNOSIS — Z87891 Personal history of nicotine dependence: Secondary | ICD-10-CM | POA: Diagnosis not present

## 2016-03-12 DIAGNOSIS — E785 Hyperlipidemia, unspecified: Secondary | ICD-10-CM | POA: Insufficient documentation

## 2016-03-12 DIAGNOSIS — I5042 Chronic combined systolic (congestive) and diastolic (congestive) heart failure: Secondary | ICD-10-CM | POA: Insufficient documentation

## 2016-03-12 DIAGNOSIS — Z992 Dependence on renal dialysis: Secondary | ICD-10-CM | POA: Insufficient documentation

## 2016-03-12 DIAGNOSIS — K219 Gastro-esophageal reflux disease without esophagitis: Secondary | ICD-10-CM | POA: Insufficient documentation

## 2016-03-12 DIAGNOSIS — N183 Chronic kidney disease, stage 3 unspecified: Secondary | ICD-10-CM

## 2016-03-12 DIAGNOSIS — Z7982 Long term (current) use of aspirin: Secondary | ICD-10-CM | POA: Diagnosis not present

## 2016-03-12 DIAGNOSIS — Z833 Family history of diabetes mellitus: Secondary | ICD-10-CM | POA: Diagnosis not present

## 2016-03-12 DIAGNOSIS — I4819 Other persistent atrial fibrillation: Secondary | ICD-10-CM

## 2016-03-12 DIAGNOSIS — Z881 Allergy status to other antibiotic agents status: Secondary | ICD-10-CM | POA: Insufficient documentation

## 2016-03-12 DIAGNOSIS — I481 Persistent atrial fibrillation: Secondary | ICD-10-CM | POA: Diagnosis not present

## 2016-03-12 DIAGNOSIS — N184 Chronic kidney disease, stage 4 (severe): Secondary | ICD-10-CM | POA: Diagnosis not present

## 2016-03-12 DIAGNOSIS — Z888 Allergy status to other drugs, medicaments and biological substances status: Secondary | ICD-10-CM | POA: Insufficient documentation

## 2016-03-12 NOTE — Patient Instructions (Signed)
Continue weighing daily and call for an overnight weight gain of > 2 pounds or a weekly weight gain of >5 pounds. 

## 2016-03-12 NOTE — Telephone Encounter (Signed)
We will contact drug rep no samples available.

## 2016-03-12 NOTE — Progress Notes (Signed)
Patient ID: Patrick [name], male    DOB: 1932-02-25, 82 y.o.   MRN: [id number]  HPI  Patrick [name] is a 82 y/o male with a history of chronic kidney disease (stage III-IV), diabetes on insulin, HTN, persistent atrial fibrillation and chronic diastolic heart failure.  Last echo done 12/21/15 which showed an EF of 60-65%without any aortic stenosis and trivial tricuspid regurgitation. This is an improvement from his previous echocardiogram which showed an EF of 40-45%.  Most recent admission was on 01/22/16 for acute on chronic heart failure. Patient was diuresed and cardiology consult obtained. Discharged in 2 days. Previous admission was on 12/20/15 with exacerbation of heart failure. Was IV diuresed, was seen by cardiology and discharged home. Torsemide ordered for every other day. Previous hospital admission was August 2017 with pneumonia. Treated with antibiotics and prednisone.  Returns today for a f/u. Weighing himself daily and weight chart shows an overall stable weight. Does endorse being tired with little exertion along with experiencing some shortness of breath. Slight edema in his lower legs but tends to be above the sock line. Denies any chest pain or palpitations.  Past Medical History:  Diagnosis Date  . Amputated finger   . Anemia of chronic disease    a. plan of oncology to start Procrit - received during admission 12/2013  . Atypical chest pain    a. 12/2014 Neg CE - in setting of L pleural effusion.  . Bell's palsy   . Chronic combined systolic and diastolic CHF (congestive heart failure) ([place])    a. 11/2012 Echo: EF 60-65%, mod conc LVH, mildly dil LA/RA, mild Ao sclerosis w/o stenosis; b. 11/2014 Echo: EF 40-45%, prob mid-apicalanteroseptal, ant, apical HK, Gr2 DD, mod dil LA, mildly dil RA (technically difficult study); c. echo 11/2015: EF 60-65%, trivial AI, nl RV sys fxan, PASP 59; d. echo 10/17: EF 60-65%, no RWMA, mildly dilated LA, mildly reduced RV sys fxn, PASP 70   . CKD (chronic kidney disease), stage III    a. stage III-IV  . Diabetes mellitus without complication ([place])   . GERD (gastroesophageal reflux disease)   . History of gastroesophageal reflux (GERD)   . Hyperlipidemia   . Hypertension   . Permanent atrial fibrillation ([place])    a. Dx 12/2011, Rate-controlled, chronic Xarelto (renal dosing) - CHA2DS2VASc = 5.  . Pleural effusion, left    a. 12/2014 s/p thoracentesis - protein <3, LDH 123, no malignancy.    Past Surgical History:  Procedure Laterality Date  . APPENDECTOMY    . COLONOSCOPY  09/2011  . ESOPHAGOGASTRODUODENOSCOPY    . FINGER SURGERY     right hand    Family History  Problem Relation Age of Onset  . Heart attack Brother   . Diabetes Mother   . Diabetes Sister   . Hypertension      Social History  Substance Use Topics  . Smoking status: Former Smoker    Packs/day: 0.25    Years: 10.00    Types: Cigars    Quit date: 05/06/1966  . Smokeless tobacco: Never Used  . Alcohol use No    Allergies  Allergen Reactions  . Levofloxacin Other (See Comments)    confusion  . Prednisone Other (See Comments)    confusion    Prior to Admission medications   Medication Sig Start Date End Date Taking? Authorizing Provider  aspirin 81 MG tablet Take 81 mg by mouth daily.   Yes Historical Provider, MD  cloNIDine (CATAPRES) 0.1  MG tablet Take 1 tablet (0.1 mg total) by mouth daily. Patient taking differently: Take 0.1 mg by mouth 2 (two) times daily.  01/24/16  Yes Fritzi Mandes, MD  insulin lispro protamine-lispro (HUMALOG MIX 75/25) (75-25) 100 UNIT/ML SUSP injection Inject 50 Units into the skin 2 (two) times daily. Don't take if under 150 01/24/16  Yes Fritzi Mandes, MD  omeprazole (PRILOSEC) 40 MG capsule Take 1 capsule (40 mg total) by mouth daily. 09/20/15  Yes Alisa Graff, FNP  potassium chloride 20 MEQ TBCR Take 20 mEq by mouth every other day. Patient taking differently: Take 20 mEq by mouth every other day. Tuesday and  Thursday if fluid on board 12/22/15  Yes Henreitta Leber, MD  pravastatin (PRAVACHOL) 40 MG tablet Take 2 tablets (80 mg total) by mouth daily. Take two tablets daily. Patient taking differently: Take 80 mg by mouth daily.  09/20/15  Yes Alisa Graff, FNP  Rivaroxaban (XARELTO) 15 MG TABS tablet Take 1 tablet (15 mg total) by mouth daily. 05/05/12  Yes Minna Merritts, MD  torsemide (DEMADEX) 20 MG tablet Take 1 tablet (20 mg total) by mouth every other day. Patient taking differently: Take 20 mg by mouth every other day. As needed on Tues/Thurs for fluid 12/22/15  Yes Henreitta Leber, MD  budesonide (PULMICORT) 0.25 MG/2ML nebulizer solution Take 2 mLs (0.25 mg total) by nebulization 2 (two) times daily. Dx code J44.9 Patient not taking: Reported on 03/12/2016 03/06/16 03/06/17  Flora Lipps, MD  formoterol (PERFOROMIST) 20 MCG/2ML nebulizer solution Take 2 mLs (20 mcg total) by nebulization 2 (two) times daily. Dx code j69.9 Patient not taking: Reported on 03/12/2016 03/06/16   Flora Lipps, MD    Review of Systems  Constitutional: Positive for appetite change and fatigue.  HENT: Negative for congestion, postnasal drip and sore throat.   Eyes: Negative.   Respiratory: Positive for shortness of breath. Negative for chest tightness and wheezing.   Cardiovascular: Positive for leg swelling (little bit). Negative for chest pain and palpitations.  Gastrointestinal: Negative for abdominal distention and abdominal pain.  Endocrine: Negative.   Genitourinary: Negative.   Musculoskeletal: Negative for back pain and neck pain.  Skin: Negative.   Allergic/Immunologic: Negative.   Neurological: Negative for dizziness and light-headedness.  Hematological: Negative for adenopathy. Does not bruise/bleed easily.  Psychiatric/Behavioral: Negative for dysphoric mood and sleep disturbance (wearing oxygen at 3L around the clock). The patient is not nervous/anxious.    Vitals:   03/12/16 1403  BP: (!) 161/73   Pulse: 85  Resp: 18  Weight: 197 lb (89.4 kg)  Height: 5\' 7"  (1.702 m)   Wt Readings from Last 3 Encounters:  03/12/16 197 lb (89.4 kg)  03/05/16 193 lb (87.5 kg)  03/04/16 192 lb 8 oz (87.3 kg)   Lab Results  Component Value Date   CREATININE 2.53 (H) 02/05/2016   CREATININE 2.16 (H) 01/24/2016   CREATININE 1.99 (H) 01/23/2016   Physical Exam  Constitutional: He is oriented to person, place, and time. He appears well-developed and well-nourished.  HENT:  Head: Normocephalic and atraumatic.  Eyes: Conjunctivae are normal. Pupils are equal, round, and reactive to light.  Neck: Normal range of motion. Neck supple. No JVD present.  Cardiovascular: Normal rate and regular rhythm.   Pulmonary/Chest: Effort normal. He has no wheezes. He has no rales.  Abdominal: Soft. He exhibits no distension. There is no tenderness.  Musculoskeletal: He exhibits edema (trace edema bilaterally above the sock line). He  exhibits no tenderness.  Neurological: He is alert and oriented to person, place, and time.  Skin: Skin is warm and dry.  Psychiatric: He has a normal mood and affect. His behavior is normal. Thought content normal.  Nursing note and vitals reviewed.  Assessment & Plan:  1: Chronic heart failure with preserved ejection fraction-  - NYHA class III - euvolemic today - Continue daily weights and call for an overnight weight gain of >2 pounds or weekly weight gain of >5 pounds. Does take torsemide as needed for weight gain - elevate legs when sitting at home - not adding salt to his food - last saw cardiologist [name]) on 03/04/16  2: HTN- -Blood pressure slightly elevated today -Home blood pressure readings reviewed and also are elevated at times. Limited with medications due to renal function - Wife is to continue checking blood pressure.  3: Persistent atrial fibrillation- - regular rhythm today - no bleeding noted with xarelto  4: CKD, stage III-IV- -Follows closely with  nephrology  -Remains uninterested in dialysis - glucose today was 144  Medication bottles were reviewed.  Return here in 3 months or sooner for any questions/problems before then

## 2016-03-12 NOTE — Telephone Encounter (Signed)
Patient calling the office for samples of medication:   1.  What medication and dosage are you requesting samples for? xarelto 15 mg po daily   2.  Are you currently out of this medication?  No but has 4 left

## 2016-03-13 ENCOUNTER — Telehealth: Payer: Self-pay | Admitting: Internal Medicine

## 2016-03-13 NOTE — Telephone Encounter (Signed)
Pt wife calling stating on of the prescriptions we sent into [facility] is too expensive and would like [place] to call in another option She will call back tomorrow and let us know which one that was.

## 2016-03-13 NOTE — Telephone Encounter (Signed)
Spoke with pt to inform him that his samples of Xarelto are ready for pick-up.   Xarelto15mg  (3 bottles) Lot: [id number]     Exp.date: 06/2017

## 2016-03-14 ENCOUNTER — Other Ambulatory Visit: Payer: Self-pay | Admitting: *Deleted

## 2016-03-14 ENCOUNTER — Telehealth: Payer: Self-pay | Admitting: Cardiovascular Disease

## 2016-03-14 MED ORDER — RIVAROXABAN 15 MG PO TABS: 15.0000 mg | ORAL_TABLET | Freq: Every day | ORAL | 3 refills | Status: DC

## 2016-03-14 NOTE — Telephone Encounter (Signed)
Xarelto 15 mg tablet #90 R#3 sent to [facility].

## 2016-03-14 NOTE — Telephone Encounter (Signed)
*  STAT* If patient is at the pharmacy, call can be transferred to refill team.   1. Which medications need to be refilled? (please list name of each medication and dose if known Rivaroxaban (XARELTO) 15 MG TABS tablet)   2. Which pharmacy/location (including street and city if local pharmacy) is medication to be sent to? [place]  3. Do they need a 30 day or 90 day supply? 90 day

## 2016-03-14 NOTE — Telephone Encounter (Signed)
Requested Prescriptions   Signed Prescriptions Disp Refills  . Rivaroxaban (XARELTO) 15 MG TABS tablet 90 tablet 3    Sig: Take 1 tablet (15 mg total) by mouth daily.    Authorizing Provider: Minna Merritts    Ordering User: Britt Bottom

## 2016-03-15 DIAGNOSIS — I130 Hypertensive heart and chronic kidney disease with heart failure and stage 1 through stage 4 chronic kidney disease, or unspecified chronic kidney disease: Secondary | ICD-10-CM | POA: Diagnosis not present

## 2016-03-15 DIAGNOSIS — E785 Hyperlipidemia, unspecified: Secondary | ICD-10-CM | POA: Diagnosis not present

## 2016-03-15 DIAGNOSIS — I481 Persistent atrial fibrillation: Secondary | ICD-10-CM | POA: Diagnosis not present

## 2016-03-15 DIAGNOSIS — J9601 Acute respiratory failure with hypoxia: Secondary | ICD-10-CM | POA: Diagnosis not present

## 2016-03-15 DIAGNOSIS — I5043 Acute on chronic combined systolic (congestive) and diastolic (congestive) heart failure: Secondary | ICD-10-CM | POA: Diagnosis not present

## 2016-03-15 DIAGNOSIS — E1122 Type 2 diabetes mellitus with diabetic chronic kidney disease: Secondary | ICD-10-CM | POA: Diagnosis not present

## 2016-03-15 DIAGNOSIS — N183 Chronic kidney disease, stage 3 (moderate): Secondary | ICD-10-CM | POA: Diagnosis not present

## 2016-03-15 DIAGNOSIS — Z9981 Dependence on supplemental oxygen: Secondary | ICD-10-CM | POA: Diagnosis not present

## 2016-03-15 DIAGNOSIS — D638 Anemia in other chronic diseases classified elsewhere: Secondary | ICD-10-CM | POA: Diagnosis not present

## 2016-03-15 NOTE — Telephone Encounter (Signed)
atc pt--no VM has been set up wcb

## 2016-03-19 NOTE — Telephone Encounter (Signed)
Called and spoke with pts wife and she stated that she called the insurance company and they advised her that they would need to speak with someone from Dr. Zoila Shutter office.  I have called  [phone]  Ext  [phone] and left a message for the insurance company to call us back.

## 2016-03-19 NOTE — Telephone Encounter (Signed)
Pt wife calling back about husbands meds, please advise.Hillery Hunter

## 2016-03-19 NOTE — Telephone Encounter (Signed)
Spoke with pt's wife who is requesting a cheaper medication as perforomist is a 900 dollar co pay. I have advised pt's wife to call the insurance company and ask for a formulary. Pt's spouse states she will call us back once she has received a formulary.

## 2016-03-21 NOTE — Telephone Encounter (Signed)
Lm for insurance company X2

## 2016-03-22 NOTE — Telephone Encounter (Signed)
Spoke with [facility], who states the clarification was needed on the Rx that was signed by DK and faxed back for serevent, which is the alternative for perforomist. I provided [facility] pharmacist with the correct sig for this Rx and refills. I have left message for pt to make him aware of this.

## 2016-03-22 NOTE — Telephone Encounter (Signed)
Pt. Aware.

## 2016-03-26 DIAGNOSIS — N183 Chronic kidney disease, stage 3 (moderate): Secondary | ICD-10-CM | POA: Diagnosis not present

## 2016-03-26 DIAGNOSIS — I5042 Chronic combined systolic (congestive) and diastolic (congestive) heart failure: Secondary | ICD-10-CM | POA: Diagnosis not present

## 2016-03-26 DIAGNOSIS — I481 Persistent atrial fibrillation: Secondary | ICD-10-CM | POA: Diagnosis not present

## 2016-03-26 DIAGNOSIS — J9601 Acute respiratory failure with hypoxia: Secondary | ICD-10-CM | POA: Diagnosis not present

## 2016-03-29 ENCOUNTER — Telehealth: Payer: Self-pay | Admitting: Internal Medicine

## 2016-03-29 NOTE — Telephone Encounter (Signed)
Spoke with [employer] and the rep. Did not see anything documented as to why she was calling. So as of now nothing further is needed at this time.

## 2016-04-02 ENCOUNTER — Emergency Department: Payer: Medicare HMO

## 2016-04-02 ENCOUNTER — Inpatient Hospital Stay
Admission: EM | Admit: 2016-04-02 | Discharge: 2016-04-04 | DRG: 291 | Disposition: A | Discharge status: Home Health | Payer: Medicare HMO | Attending: Internal Medicine | Admitting: Internal Medicine

## 2016-04-02 DIAGNOSIS — E1122 Type 2 diabetes mellitus with diabetic chronic kidney disease: Secondary | ICD-10-CM | POA: Diagnosis present

## 2016-04-02 DIAGNOSIS — Z7982 Long term (current) use of aspirin: Secondary | ICD-10-CM | POA: Diagnosis not present

## 2016-04-02 DIAGNOSIS — I130 Hypertensive heart and chronic kidney disease with heart failure and stage 1 through stage 4 chronic kidney disease, or unspecified chronic kidney disease: Secondary | ICD-10-CM | POA: Diagnosis not present

## 2016-04-02 DIAGNOSIS — J9620 Acute and chronic respiratory failure, unspecified whether with hypoxia or hypercapnia: Secondary | ICD-10-CM | POA: Diagnosis present

## 2016-04-02 DIAGNOSIS — I482 Chronic atrial fibrillation: Secondary | ICD-10-CM | POA: Diagnosis not present

## 2016-04-02 DIAGNOSIS — M6281 Muscle weakness (generalized): Secondary | ICD-10-CM

## 2016-04-02 DIAGNOSIS — Z87891 Personal history of nicotine dependence: Secondary | ICD-10-CM

## 2016-04-02 DIAGNOSIS — J9621 Acute and chronic respiratory failure with hypoxia: Secondary | ICD-10-CM | POA: Diagnosis not present

## 2016-04-02 DIAGNOSIS — I5033 Acute on chronic diastolic (congestive) heart failure: Secondary | ICD-10-CM | POA: Diagnosis present

## 2016-04-02 DIAGNOSIS — J440 Chronic obstructive pulmonary disease with acute lower respiratory infection: Secondary | ICD-10-CM | POA: Diagnosis present

## 2016-04-02 DIAGNOSIS — Z833 Family history of diabetes mellitus: Secondary | ICD-10-CM

## 2016-04-02 DIAGNOSIS — I5043 Acute on chronic combined systolic (congestive) and diastolic (congestive) heart failure: Secondary | ICD-10-CM | POA: Diagnosis not present

## 2016-04-02 DIAGNOSIS — Z9981 Dependence on supplemental oxygen: Secondary | ICD-10-CM

## 2016-04-02 DIAGNOSIS — K219 Gastro-esophageal reflux disease without esophagitis: Secondary | ICD-10-CM | POA: Diagnosis present

## 2016-04-02 DIAGNOSIS — Z79899 Other long term (current) drug therapy: Secondary | ICD-10-CM

## 2016-04-02 DIAGNOSIS — J209 Acute bronchitis, unspecified: Secondary | ICD-10-CM | POA: Diagnosis present

## 2016-04-02 DIAGNOSIS — E785 Hyperlipidemia, unspecified: Secondary | ICD-10-CM | POA: Diagnosis present

## 2016-04-02 DIAGNOSIS — J441 Chronic obstructive pulmonary disease with (acute) exacerbation: Secondary | ICD-10-CM

## 2016-04-02 DIAGNOSIS — Z8249 Family history of ischemic heart disease and other diseases of the circulatory system: Secondary | ICD-10-CM

## 2016-04-02 DIAGNOSIS — D638 Anemia in other chronic diseases classified elsewhere: Secondary | ICD-10-CM | POA: Diagnosis present

## 2016-04-02 DIAGNOSIS — Z89021 Acquired absence of right finger(s): Secondary | ICD-10-CM

## 2016-04-02 DIAGNOSIS — Z7901 Long term (current) use of anticoagulants: Secondary | ICD-10-CM | POA: Diagnosis not present

## 2016-04-02 DIAGNOSIS — N183 Chronic kidney disease, stage 3 (moderate): Secondary | ICD-10-CM | POA: Diagnosis present

## 2016-04-02 DIAGNOSIS — R042 Hemoptysis: Secondary | ICD-10-CM | POA: Diagnosis present

## 2016-04-02 DIAGNOSIS — I4891 Unspecified atrial fibrillation: Secondary | ICD-10-CM | POA: Diagnosis not present

## 2016-04-02 DIAGNOSIS — Z794 Long term (current) use of insulin: Secondary | ICD-10-CM

## 2016-04-02 DIAGNOSIS — I509 Heart failure, unspecified: Secondary | ICD-10-CM

## 2016-04-02 DIAGNOSIS — Z7951 Long term (current) use of inhaled steroids: Secondary | ICD-10-CM | POA: Diagnosis not present

## 2016-04-02 DIAGNOSIS — D649 Anemia, unspecified: Secondary | ICD-10-CM | POA: Diagnosis not present

## 2016-04-02 DIAGNOSIS — Z888 Allergy status to other drugs, medicaments and biological substances status: Secondary | ICD-10-CM

## 2016-04-02 DIAGNOSIS — R0602 Shortness of breath: Secondary | ICD-10-CM | POA: Diagnosis not present

## 2016-04-02 LAB — CBC WITH DIFFERENTIAL/PLATELET
BASOS ABS: 0.1 10*3/uL (ref 0–0.1)
Basophils Relative: 1 %
EOS ABS: 0 10*3/uL (ref 0–0.7)
EOS PCT: 0 %
HCT: 28.2 % — ABNORMAL LOW (ref 40.0–52.0)
Hemoglobin: 9.1 g/dL — ABNORMAL LOW (ref 13.0–18.0)
LYMPHS PCT: 5 %
Lymphs Abs: 0.3 10*3/uL — ABNORMAL LOW (ref 1.0–3.6)
MCH: 26.8 pg (ref 26.0–34.0)
MCHC: 32.1 g/dL (ref 32.0–36.0)
MCV: 83.3 fL (ref 80.0–100.0)
MONO ABS: 0.4 10*3/uL (ref 0.2–1.0)
Monocytes Relative: 6 %
Neutro Abs: 5.8 10*3/uL (ref 1.4–6.5)
Neutrophils Relative %: 88 %
PLATELETS: 248 10*3/uL (ref 150–440)
RBC: 3.39 MIL/uL — AB (ref 4.40–5.90)
RDW: 20 % — ABNORMAL HIGH (ref 11.5–14.5)
WBC: 6.6 10*3/uL (ref 3.8–10.6)

## 2016-04-02 LAB — BLOOD GAS, VENOUS
PATIENT TEMPERATURE: 37
PCO2 VEN: 39 mmHg — AB (ref 44.0–60.0)
pH, Ven: 7.52 — ABNORMAL HIGH (ref 7.250–7.430)

## 2016-04-02 LAB — BRAIN NATRIURETIC PEPTIDE: B Natriuretic Peptide: 389 pg/mL — ABNORMAL HIGH (ref 0.0–100.0)

## 2016-04-02 LAB — BASIC METABOLIC PANEL
ANION GAP: 7 (ref 5–15)
BUN: 26 mg/dL — AB (ref 6–20)
CALCIUM: 9 mg/dL (ref 8.9–10.3)
CO2: 30 mmol/L (ref 22–32)
Chloride: 100 mmol/L — ABNORMAL LOW (ref 101–111)
Creatinine, Ser: 1.91 mg/dL — ABNORMAL HIGH (ref 0.61–1.24)
GFR calc Af Amer: 36 mL/min — ABNORMAL LOW (ref >60)
GFR, EST NON AFRICAN AMERICAN: 31 mL/min — AB (ref >60)
GLUCOSE: 138 mg/dL — AB (ref 65–99)
POTASSIUM: 3.5 mmol/L (ref 3.5–5.1)
SODIUM: 137 mmol/L (ref 135–145)

## 2016-04-02 LAB — GLUCOSE, CAPILLARY
GLUCOSE-CAPILLARY: 216 mg/dL — AB (ref 65–99)
GLUCOSE-CAPILLARY: 319 mg/dL — AB (ref 65–99)

## 2016-04-02 LAB — RAPID INFLUENZA A&B ANTIGENS: Influenza B (ARMC): NEGATIVE

## 2016-04-02 LAB — RAPID INFLUENZA A&B ANTIGENS (ARMC ONLY): INFLUENZA A (ARMC): NEGATIVE

## 2016-04-02 MED ORDER — ONDANSETRON HCL 4 MG/2ML IJ SOLN: 4.0000 mg | Freq: Four times a day (QID) | INTRAMUSCULAR | Status: DC | PRN

## 2016-04-02 MED ORDER — RIVAROXABAN 15 MG PO TABS
15.0000 mg | ORAL_TABLET | Freq: Every day | ORAL | Status: DC
  Administered 2016-04-03 – 2016-04-04 (×2): 15 mg via ORAL
  Filled 2016-04-02 (×2): qty 1

## 2016-04-02 MED ORDER — ONDANSETRON HCL 4 MG PO TABS: 4.0000 mg | ORAL_TABLET | Freq: Four times a day (QID) | ORAL | Status: DC | PRN

## 2016-04-02 MED ORDER — ALBUTEROL SULFATE (2.5 MG/3ML) 0.083% IN NEBU
5.0000 mg | INHALATION_SOLUTION | Freq: Once | RESPIRATORY_TRACT | Status: AC
  Administered 2016-04-02: 5 mg via RESPIRATORY_TRACT
  Filled 2016-04-02: qty 6

## 2016-04-02 MED ORDER — FUROSEMIDE 10 MG/ML IJ SOLN
40.0000 mg | Freq: Once | INTRAMUSCULAR | Status: AC
  Administered 2016-04-02: 40 mg via INTRAVENOUS
  Filled 2016-04-02 (×2): qty 4

## 2016-04-02 MED ORDER — POTASSIUM CHLORIDE CRYS ER 20 MEQ PO TBCR
40.0000 meq | EXTENDED_RELEASE_TABLET | Freq: Once | ORAL | Status: AC
  Administered 2016-04-02: 40 meq via ORAL
  Filled 2016-04-02: qty 2

## 2016-04-02 MED ORDER — AZITHROMYCIN 250 MG PO TABS
500.0000 mg | ORAL_TABLET | Freq: Every day | ORAL | Status: DC
  Administered 2016-04-03 – 2016-04-04 (×2): 500 mg via ORAL
  Filled 2016-04-02 (×2): qty 2

## 2016-04-02 MED ORDER — ALBUTEROL SULFATE (2.5 MG/3ML) 0.083% IN NEBU: 2.5000 mg | INHALATION_SOLUTION | RESPIRATORY_TRACT | Status: DC | PRN

## 2016-04-02 MED ORDER — ACETAMINOPHEN 650 MG RE SUPP: 650.0000 mg | Freq: Four times a day (QID) | RECTAL | Status: DC | PRN

## 2016-04-02 MED ORDER — INSULIN ASPART 100 UNIT/ML ~~LOC~~ SOLN
0.0000 [IU] | Freq: Every day | SUBCUTANEOUS | Status: DC
  Administered 2016-04-02: 4 [IU] via SUBCUTANEOUS
  Filled 2016-04-02: qty 3
  Filled 2016-04-02: qty 4

## 2016-04-02 MED ORDER — FUROSEMIDE 10 MG/ML IJ SOLN
40.0000 mg | Freq: Two times a day (BID) | INTRAMUSCULAR | Status: DC
  Administered 2016-04-02 – 2016-04-04 (×4): 40 mg via INTRAVENOUS
  Filled 2016-04-02 (×4): qty 4

## 2016-04-02 MED ORDER — METHYLPREDNISOLONE SODIUM SUCC 125 MG IJ SOLR
60.0000 mg | Freq: Two times a day (BID) | INTRAMUSCULAR | Status: DC
  Administered 2016-04-02 – 2016-04-04 (×4): 60 mg via INTRAVENOUS
  Filled 2016-04-02 (×4): qty 2

## 2016-04-02 MED ORDER — PANTOPRAZOLE SODIUM 40 MG PO TBEC
40.0000 mg | DELAYED_RELEASE_TABLET | Freq: Every day | ORAL | Status: DC
  Administered 2016-04-03 – 2016-04-04 (×2): 40 mg via ORAL
  Filled 2016-04-02 (×2): qty 1

## 2016-04-02 MED ORDER — SODIUM CHLORIDE 0.9% FLUSH: 3.0000 mL | Freq: Two times a day (BID) | INTRAVENOUS | Status: DC

## 2016-04-02 MED ORDER — ACETAMINOPHEN 325 MG PO TABS
650.0000 mg | ORAL_TABLET | Freq: Four times a day (QID) | ORAL | Status: DC | PRN
  Filled 2016-04-02: qty 2

## 2016-04-02 MED ORDER — CEFTRIAXONE SODIUM 1 G IJ SOLR: 1.0000 g | Freq: Once | INTRAMUSCULAR | Status: DC

## 2016-04-02 MED ORDER — POLYETHYLENE GLYCOL 3350 17 G PO PACK: 17.0000 g | PACK | Freq: Every day | ORAL | Status: DC | PRN

## 2016-04-02 MED ORDER — POTASSIUM CHLORIDE CRYS ER 10 MEQ PO TBCR
20.0000 meq | EXTENDED_RELEASE_TABLET | Freq: Every day | ORAL | Status: DC
  Administered 2016-04-02 – 2016-04-04 (×3): 20 meq via ORAL
  Filled 2016-04-02 (×3): qty 2

## 2016-04-02 MED ORDER — ARFORMOTEROL TARTRATE 15 MCG/2ML IN NEBU
15.0000 ug | INHALATION_SOLUTION | Freq: Two times a day (BID) | RESPIRATORY_TRACT | Status: DC
  Administered 2016-04-03 – 2016-04-04 (×3): 15 ug via RESPIRATORY_TRACT
  Filled 2016-04-02 (×5): qty 2

## 2016-04-02 MED ORDER — BUDESONIDE 0.25 MG/2ML IN SUSP
0.2500 mg | Freq: Two times a day (BID) | RESPIRATORY_TRACT | Status: DC
  Administered 2016-04-02 – 2016-04-04 (×4): 0.25 mg via RESPIRATORY_TRACT
  Filled 2016-04-02 (×4): qty 2

## 2016-04-02 MED ORDER — SODIUM CHLORIDE 0.9% FLUSH: 3.0000 mL | INTRAVENOUS | Status: DC | PRN

## 2016-04-02 MED ORDER — DEXTROSE 5 % IV SOLN
500.0000 mg | Freq: Once | INTRAVENOUS | Status: AC
  Administered 2016-04-02: 500 mg via INTRAVENOUS
  Filled 2016-04-02: qty 500

## 2016-04-02 MED ORDER — CEFTRIAXONE SODIUM-DEXTROSE 1-3.74 GM-% IV SOLR
1.0000 g | Freq: Once | INTRAVENOUS | Status: AC
  Administered 2016-04-02: 1 g via INTRAVENOUS
  Filled 2016-04-02: qty 50

## 2016-04-02 MED ORDER — ASPIRIN EC 81 MG PO TBEC
81.0000 mg | DELAYED_RELEASE_TABLET | Freq: Every day | ORAL | Status: DC
  Administered 2016-04-03 – 2016-04-04 (×2): 81 mg via ORAL
  Filled 2016-04-02 (×2): qty 1

## 2016-04-02 MED ORDER — INSULIN ASPART 100 UNIT/ML ~~LOC~~ SOLN
0.0000 [IU] | Freq: Three times a day (TID) | SUBCUTANEOUS | Status: DC
  Administered 2016-04-02: 5 [IU] via SUBCUTANEOUS
  Administered 2016-04-03: 3 [IU] via SUBCUTANEOUS
  Administered 2016-04-03: 5 [IU] via SUBCUTANEOUS
  Administered 2016-04-03 – 2016-04-04 (×2): 3 [IU] via SUBCUTANEOUS
  Filled 2016-04-02 (×2): qty 5
  Filled 2016-04-02 (×2): qty 3

## 2016-04-02 MED ORDER — CLONIDINE HCL 0.1 MG PO TABS
0.1000 mg | ORAL_TABLET | Freq: Two times a day (BID) | ORAL | Status: DC
  Administered 2016-04-02 – 2016-04-04 (×4): 0.1 mg via ORAL
  Filled 2016-04-02 (×4): qty 1

## 2016-04-02 MED ORDER — INSULIN ASPART PROT & ASPART (70-30 MIX) 100 UNIT/ML ~~LOC~~ SUSP
45.0000 [IU] | Freq: Two times a day (BID) | SUBCUTANEOUS | Status: DC
  Administered 2016-04-03 – 2016-04-04 (×3): 45 [IU] via SUBCUTANEOUS
  Filled 2016-04-02 (×3): qty 45

## 2016-04-02 MED ORDER — PRAVASTATIN SODIUM 40 MG PO TABS
80.0000 mg | ORAL_TABLET | Freq: Every day | ORAL | Status: DC
  Administered 2016-04-02 – 2016-04-04 (×3): 80 mg via ORAL
  Filled 2016-04-02 (×3): qty 2

## 2016-04-02 MED ORDER — SODIUM CHLORIDE 0.9 % IV SOLN: 250.0000 mL | INTRAVENOUS | Status: DC | PRN

## 2016-04-02 MED ORDER — IPRATROPIUM-ALBUTEROL 0.5-2.5 (3) MG/3ML IN SOLN
3.0000 mL | Freq: Four times a day (QID) | RESPIRATORY_TRACT | Status: DC
  Administered 2016-04-02 – 2016-04-04 (×8): 3 mL via RESPIRATORY_TRACT
  Filled 2016-04-02 (×7): qty 3

## 2016-04-02 MED ORDER — SODIUM CHLORIDE 0.9% FLUSH
3.0000 mL | Freq: Two times a day (BID) | INTRAVENOUS | Status: DC
  Administered 2016-04-02 – 2016-04-03 (×3): 3 mL via INTRAVENOUS

## 2016-04-02 NOTE — H&P (Addendum)
[facility] [place] NAME: Patrick [name]    MR#:  [id number]  DATE OF BIRTH:  Dec 24, 1931  DATE OF ADMISSION:  04/02/2016  PRIMARY CARE PHYSICIAN: Maryland Pink, MD   REQUESTING/REFERRING PHYSICIAN: Dr. Joni Fears  CHIEF COMPLAINT:   Chief Complaint  Patient presents with  . Shortness of Breath    HISTORY OF PRESENT ILLNESS:  Nuno Brubacher  is a 83 y.o. male with a known history of Chronic diastolic CHF, chronic respiratory failure on 2 L oxygen, atrial fibrillation, hypertension, CKD stage III presents to the emergency room complaining of 2 days of worsening shortness of breath. He has noticed cough with productive sputum with some blood. Chronic orthopnea and lower extremity edema is unchanged. Poor appetite. No nausea vomiting or abdominal pain. He tried 5 L oxygen at home and continued to feel short of breath and presented to the ER. Here patient's influenza is negative. Afebrile. Normal WBC.  Follows with Dr. Mortimer Fries.   PAST MEDICAL HISTORY:   Past Medical History:  Diagnosis Date  . Amputated finger   . Anemia of chronic disease    a. plan of oncology to start Procrit - received during admission 12/2013  . Atypical chest pain    a. 12/2014 Neg CE - in setting of L pleural effusion.  . Bell's palsy   . Chronic combined systolic and diastolic CHF (congestive heart failure) ([place])    a. 11/2012 Echo: EF 60-65%, mod conc LVH, mildly dil LA/RA, mild Ao sclerosis w/o stenosis; b. 11/2014 Echo: EF 40-45%, prob mid-apicalanteroseptal, ant, apical HK, Gr2 DD, mod dil LA, mildly dil RA (technically difficult study); c. echo 11/2015: EF 60-65%, trivial AI, nl RV sys fxan, PASP 59; d. echo 10/17: EF 60-65%, no RWMA, mildly dilated LA, mildly reduced RV sys fxn, PASP 70  . CKD (chronic kidney disease), stage III    a. stage III-IV  . Diabetes mellitus without complication ([place])   . GERD (gastroesophageal reflux disease)   . History of  gastroesophageal reflux (GERD)   . Hyperlipidemia   . Hypertension   . Permanent atrial fibrillation ([place])    a. Dx 12/2011, Rate-controlled, chronic Xarelto (renal dosing) - CHA2DS2VASc = 5.  . Pleural effusion, left    a. 12/2014 s/p thoracentesis - protein <3, LDH 123, no malignancy.    PAST SURGICAL HISTORY:   Past Surgical History:  Procedure Laterality Date  . APPENDECTOMY    . COLONOSCOPY  09/2011  . ESOPHAGOGASTRODUODENOSCOPY    . FINGER SURGERY     right hand    SOCIAL HISTORY:   Social History  Substance Use Topics  . Smoking status: Former Smoker    Packs/day: 0.25    Years: 10.00    Types: Cigars    Quit date: 05/06/1966  . Smokeless tobacco: Never Used  . Alcohol use No    FAMILY HISTORY:   Family History  Problem Relation Age of Onset  . Heart attack Brother   . Diabetes Mother   . Diabetes Sister   . Hypertension      DRUG ALLERGIES:   Allergies  Allergen Reactions  . Levofloxacin Other (See Comments)    confusion  . Prednisone Other (See Comments)    confusion    REVIEW OF SYSTEMS:   Review of Systems  Constitutional: Positive for malaise/fatigue. Negative for chills, fever and weight loss.  HENT: Negative for hearing loss and nosebleeds.   Eyes: Negative for blurred vision, double vision and  pain.  Respiratory: Positive for cough, hemoptysis, shortness of breath and wheezing. Negative for sputum production.   Cardiovascular: Positive for orthopnea and leg swelling. Negative for chest pain and palpitations.  Gastrointestinal: Negative for abdominal pain, constipation, diarrhea, nausea and vomiting.  Genitourinary: Negative for dysuria and hematuria.  Musculoskeletal: Negative for back pain, falls and myalgias.  Skin: Negative for rash.  Neurological: Positive for weakness. Negative for dizziness, tremors, sensory change, speech change, focal weakness, seizures and headaches.  Endo/Heme/Allergies: Does not bruise/bleed easily.   Psychiatric/Behavioral: Negative for depression and memory loss. The patient is not nervous/anxious.     MEDICATIONS AT HOME:   Prior to Admission medications   Medication Sig Start Date End Date Taking? Authorizing Provider  aspirin 81 MG tablet Take 81 mg by mouth daily.   Yes Historical Provider, MD  budesonide (PULMICORT) 0.25 MG/2ML nebulizer solution Take 2 mLs (0.25 mg total) by nebulization 2 (two) times daily. Dx code J44.9 03/06/16 03/06/17 Yes Flora Lipps, MD  cloNIDine (CATAPRES) 0.1 MG tablet Take 1 tablet (0.1 mg total) by mouth daily. Patient taking differently: Take 0.1 mg by mouth 2 (two) times daily.  01/24/16  Yes Fritzi Mandes, MD  insulin lispro protamine-lispro (HUMALOG MIX 75/25) (75-25) 100 UNIT/ML SUSP injection Inject 50 Units into the skin 2 (two) times daily. Don't take if under 150 Patient taking differently: Inject 50 Units into the skin 2 (two) times daily.  01/24/16  Yes Fritzi Mandes, MD  omeprazole (PRILOSEC) 40 MG capsule Take 1 capsule (40 mg total) by mouth daily. 09/20/15  Yes Alisa Graff, FNP  potassium chloride 20 MEQ TBCR Take 20 mEq by mouth every other day. 12/22/15  Yes Henreitta Leber, MD  pravastatin (PRAVACHOL) 40 MG tablet Take 2 tablets (80 mg total) by mouth daily. Take two tablets daily. Patient taking differently: Take 80 mg by mouth daily.  09/20/15  Yes Alisa Graff, FNP  Rivaroxaban (XARELTO) 15 MG TABS tablet Take 1 tablet (15 mg total) by mouth daily. 03/14/16  Yes Minna Merritts, MD  SEREVENT DISKUS 50 MCG/DOSE diskus inhaler Inhale 1 puff into the lungs 2 (two) times daily. 03/22/16  Yes Historical Provider, MD  torsemide (DEMADEX) 20 MG tablet Take 1 tablet (20 mg total) by mouth every other day. Patient taking differently: Take 20 mg by mouth every other day. As needed on Tues/Thurs for fluid 12/22/15  Yes Henreitta Leber, MD  formoterol (PERFOROMIST) 20 MCG/2ML nebulizer solution Take 2 mLs (20 mcg total) by nebulization 2 (two) times  daily. Dx code j38.9 Patient not taking: Reported on 04/02/2016 03/06/16   Flora Lipps, MD     VITAL SIGNS:  Blood pressure (!) 158/92, pulse (!) 107, temperature 99.7 F (37.6 C), temperature source Oral, resp. rate (!) 30, height 5\' 10"  (1.778 m), weight 95.3 kg (210 lb), SpO2 100 %.  PHYSICAL EXAMINATION:  Physical Exam  GENERAL:  83 y.o.-year-old patient lying in the bed with conversational dyspnea. Slurred speech EYES: Pupils equal, round, reactive to light and accommodation. No scleral icterus. Extraocular muscles intact.  HEENT: Head atraumatic, normocephalic. Oropharynx and nasopharynx clear. No oropharyngeal erythema, moist oral mucosa  NECK:  Supple, no jugular venous distention. No thyroid enlargement, no tenderness.  LUNGS: B/L wheezing, crackles. Decreased air entry CARDIOVASCULAR: S1, S2 normal. No murmurs, rubs, or gallops.  ABDOMEN: Soft, nontender, nondistended. Bowel sounds present. No organomegaly or mass.  EXTREMITIES: No pedal edema, cyanosis, or clubbing. + 2 pedal & radial pulses b/l.  NEUROLOGIC: Moves all 4 extremities PSYCHIATRIC: The patient is alert and oriented x 3. Good affect.  SKIN: No obvious rash, lesion, or ulcer.   LABORATORY PANEL:   CBC  Recent Labs Lab 04/02/16 1417  WBC 6.6  HGB 9.1*  HCT 28.2*  PLT 248   ------------------------------------------------------------------------------------------------------------------  Chemistries   Recent Labs Lab 04/02/16 1417  NA 137  K 3.5  CL 100*  CO2 30  GLUCOSE 138*  BUN 26*  CREATININE 1.91*  CALCIUM 9.0   ------------------------------------------------------------------------------------------------------------------  Cardiac Enzymes No results for input(s): TROPONINI in the last 168 hours. ------------------------------------------------------------------------------------------------------------------  RADIOLOGY:  Dg Chest 2 View  Result Date: 04/02/2016 CLINICAL DATA:   Increasing shortness of breath since yesterday. History of CHF, atrial fibrillation, acute on chronic respiratory failure, and remote history of smoking. EXAM: CHEST  2 VIEW COMPARISON:  Chest x-ray dated PA and lateral chest x-ray of January 22, 2016 FINDINGS: The lungs are well-expanded. The interstitial markings are coarse bilaterally especially at the lung bases. The findings have not greatly changed from the previous study. The cardiac silhouette is enlarged. The pulmonary vascularity is prominent centrally. There is calcification in the wall of the aortic arch with tortuosity of the descending thoracic aorta. There are small amounts of pleural fluid layering posteriorly and laterally. IMPRESSION: COPD with superimposed chronic CHF. There may be acute CHF superimposed upon these findings. There is no discrete alveolar pneumonia. Electronically Signed   By: David  Martinique M.D.   On: 04/02/2016 15:17     IMPRESSION AND PLAN:   * Acute on chronic respiratory failure due to acute bronchitis and CHF exacerbation Continue oxygen. Mean os tolerated. Nebulizers when necessary.  * Acute bronchitis -IV steroids, Antibiotics - Scheduled Nebulizers - Inhalers -Wean O2 as tolerated - Consult pulmonary if no improvement  * Minimal hemoptysis with specks of blood and sputum likely from his coughing and acute bronchitis. Influenza negative. No mass and chest x-ray.  * Acute on chronic diastolic CHF. Last ejection fraction was 60%. - IV Lasix, Beta blockers - Input and Output - Counseled to limit fluids and Salt - Monitor Bun/Cr and Potassium -Cardiology follow up after discharge.  * Permanent atrial fibrillation Continue patient's Xarelto and rate control medications from home.  * CKD stage III. Creatinine is improved compared to last admission. Monitor while being diuresed.  * Anemia of chronic disease is stable   All the records are reviewed and case discussed with ED provider. Management  plans discussed with the patient, family and they are in agreement.  CODE STATUS: FULL CODE  TOTAL TIME TAKING CARE OF THIS PATIENT: 40 minutes.   Hillary Bow R M.D on 04/02/2016 at 4:36 PM  Between 7am to 6pm - Pager - [phone]  After 6pm go to [email or web address] - password [email or web address] [facility]  Office  [phone]  CC: Primary care physician; [place] Pink, MD  Note: This dictation was prepared with Dragon dictation along with smaller phrase technology. Any transcriptional errors that result from this process are unintentional.

## 2016-04-02 NOTE — ED Notes (Signed)
Patient transported to X-ray 

## 2016-04-02 NOTE — ED Notes (Signed)
Pt removed from non-rebreather at 10L and placed on 5L via n/c per Dr Joni Fears to evaluate how pt does with his home dose of O2

## 2016-04-02 NOTE — ED Provider Notes (Signed)
[facility] Emergency Department Provider Note  ____________________________________________  Time seen: Approximately 3:51 PM  I have reviewed the triage vital signs and the nursing notes.   HISTORY  Chief Complaint Shortness of Breath    HPI Patrick [name] is a 83 y.o. male who complains of shortness of breath, worse with exertion and severe weakness. He was unable to get himself out of bed today. He's unable to walk at home due to the shortness of breath. He is on 5 L nasal cannula at home due to COPD. He also reports that he's had a productive cough for a week. He has been compliant with his medications without relief. Denies chest pain. No dizziness or syncope.     Past Medical History:  Diagnosis Date  . Amputated finger   . Anemia of chronic disease    a. plan of oncology to start Procrit - received during admission 12/2013  . Atypical chest pain    a. 12/2014 Neg CE - in setting of L pleural effusion.  . Bell's palsy   . Chronic combined systolic and diastolic CHF (congestive heart failure) ([place])    a. 11/2012 Echo: EF 60-65%, mod conc LVH, mildly dil LA/RA, mild Ao sclerosis w/o stenosis; b. 11/2014 Echo: EF 40-45%, prob mid-apicalanteroseptal, ant, apical HK, Gr2 DD, mod dil LA, mildly dil RA (technically difficult study); c. echo 11/2015: EF 60-65%, trivial AI, nl RV sys fxan, PASP 59; d. echo 10/17: EF 60-65%, no RWMA, mildly dilated LA, mildly reduced RV sys fxn, PASP 70  . CKD (chronic kidney disease), stage III    a. stage III-IV  . Diabetes mellitus without complication ([place])   . GERD (gastroesophageal reflux disease)   . History of gastroesophageal reflux (GERD)   . Hyperlipidemia   . Hypertension   . Permanent atrial fibrillation ([place])    a. Dx 12/2011, Rate-controlled, chronic Xarelto (renal dosing) - CHA2DS2VASc = 5.  . Pleural effusion, left    a. 12/2014 s/p thoracentesis - protein <3, LDH 123, no malignancy.     Patient  Active Problem List   Diagnosis Date Noted  . Lingular mass 02/06/2016  . Acute on chronic respiratory failure ([place]) 01/23/2016  . Acute on chronic combined systolic and diastolic CHF (congestive heart failure) ([place]) 01/22/2016  . Hypertensive heart disease 12/21/2015  . Chronic diastolic heart failure ([place]) 12/18/2015  . CAP (community acquired pneumonia) 11/20/2015  . CKD (chronic kidney disease), stage III   . Orthostatic hypotension 08/08/2014  . Anemia of chronic disease   . Chronic renal disease 11/26/2012  . Chronic combined systolic and diastolic CHF (congestive heart failure) ([place]) 11/26/2012  . Persistent atrial fibrillation ([place]) 01/15/2012  . Hypertension 01/15/2012  . Hyperlipidemia 01/15/2012  . Diabetes mellitus ([place]) 01/15/2012     Past Surgical History:  Procedure Laterality Date  . APPENDECTOMY    . COLONOSCOPY  09/2011  . ESOPHAGOGASTRODUODENOSCOPY    . FINGER SURGERY     right hand     Prior to Admission medications   Medication Sig Start Date End Date Taking? Authorizing Provider  aspirin 81 MG tablet Take 81 mg by mouth daily.    Historical Provider, MD  budesonide (PULMICORT) 0.25 MG/2ML nebulizer solution Take 2 mLs (0.25 mg total) by nebulization 2 (two) times daily. Dx code J44.9 Patient not taking: Reported on 03/12/2016 03/06/16 03/06/17  Flora Lipps, MD  cloNIDine (CATAPRES) 0.1 MG tablet Take 1 tablet (0.1 mg total) by mouth daily. Patient taking differently: Take  0.1 mg by mouth 2 (two) times daily.  01/24/16   Fritzi Mandes, MD  formoterol (PERFOROMIST) 20 MCG/2ML nebulizer solution Take 2 mLs (20 mcg total) by nebulization 2 (two) times daily. Dx code j30.9 Patient not taking: Reported on 03/12/2016 03/06/16   Flora Lipps, MD  insulin lispro protamine-lispro (HUMALOG MIX 75/25) (75-25) 100 UNIT/ML SUSP injection Inject 50 Units into the skin 2 (two) times daily. Don't take if under 150 01/24/16   Fritzi Mandes, MD  omeprazole (PRILOSEC) 40 MG capsule  Take 1 capsule (40 mg total) by mouth daily. 09/20/15   Alisa Graff, FNP  potassium chloride 20 MEQ TBCR Take 20 mEq by mouth every other day. Patient taking differently: Take 20 mEq by mouth every other day. Tuesday and Thursday if fluid on board 12/22/15   Henreitta Leber, MD  pravastatin (PRAVACHOL) 40 MG tablet Take 2 tablets (80 mg total) by mouth daily. Take two tablets daily. Patient taking differently: Take 80 mg by mouth daily.  09/20/15   Alisa Graff, FNP  Rivaroxaban (XARELTO) 15 MG TABS tablet Take 1 tablet (15 mg total) by mouth daily. 03/14/16   Minna Merritts, MD  torsemide (DEMADEX) 20 MG tablet Take 1 tablet (20 mg total) by mouth every other day. Patient taking differently: Take 20 mg by mouth every other day. As needed on Tues/Thurs for fluid 12/22/15   Henreitta Leber, MD     Allergies Levofloxacin and Prednisone   Family History  Problem Relation Age of Onset  . Heart attack Brother   . Diabetes Mother   . Diabetes Sister   . Hypertension      Social History Social History  Substance Use Topics  . Smoking status: Former Smoker    Packs/day: 0.25    Years: 10.00    Types: Cigars    Quit date: 05/06/1966  . Smokeless tobacco: Never Used  . Alcohol use No    Review of Systems  Constitutional:   No fever or chills.  ENT:   No sore throat. No rhinorrhea. Cardiovascular:   No chest pain. Respiratory:   Positive shortness of breath and productive cough. Gastrointestinal:   Negative for abdominal pain, vomiting and diarrhea.  Genitourinary:   Negative for dysuria or difficulty urinating. Musculoskeletal:   Positive increased peripheral edema Neurological:   Negative for headaches 10-point ROS otherwise negative.  ____________________________________________   PHYSICAL EXAM:  VITAL SIGNS: ED Triage Vitals  Enc Vitals Group     BP 04/02/16 1403 (!) 164/82     Pulse Rate 04/02/16 1403 (!) 104     Resp 04/02/16 1403 (!) 32     Temp 04/02/16 1403  99.7 F (37.6 C)     Temp Source 04/02/16 1403 Oral     SpO2 04/02/16 1403 100 %     Weight 04/02/16 1403 210 lb (95.3 kg)     Height 04/02/16 1403 5\' 10"  (1.778 m)     Head Circumference --      Peak Flow --      Pain Score 04/02/16 1404 0     Pain Loc --      Pain Edu? --      Excl. in [place]? --     Vital signs reviewed, nursing assessments reviewed.   Constitutional:   Alert and oriented. Ill-appearing Eyes:   No scleral icterus. No conjunctival pallor. PERRL. EOMI.  No nystagmus. ENT   Head:   Normocephalic and atraumatic.   Nose:  No congestion/rhinnorhea. No septal hematoma   Mouth/Throat:   MMM, no pharyngeal erythema. No peritonsillar mass.    Neck:   No stridor. No SubQ emphysema. No meningismus. Hematological/Lymphatic/Immunilogical:   No cervical lymphadenopathy. Cardiovascular:   Irregularly irregular rhythm, heart rate 100-110. Symmetric bilateral radial and DP pulses.  No murmurs.  Respiratory:   Tachypnea, increased work of breathing with accessory muscle use. Diffuse expiratory wheezes. Prolonged expiratory phase. No audible crackles.. Gastrointestinal:   Soft and nontender. Non distended. There is no CVA tenderness.  No rebound, rigidity, or guarding. Genitourinary:   deferred Musculoskeletal:   2+ pitting edema bilateral lower extremities Neurologic:   Normal speech and language.  CN 2-10 normal. Motor grossly intact. No gross focal neurologic deficits are appreciated.  Skin:    Skin is warm, dry and intact. No rash noted.  No petechiae, purpura, or bullae.  ____________________________________________    LABS (pertinent positives/negatives) (all labs ordered are listed, but only abnormal results are displayed) Labs Reviewed  BLOOD GAS, VENOUS - Abnormal; Notable for the following:       Result Value   pH, Ven 7.52 (*)    pCO2, Ven 39 (*)    pO2, Ven <31.0 (*)    All other components within normal limits  CBC WITH DIFFERENTIAL/PLATELET -  Abnormal; Notable for the following:    RBC 3.39 (*)    Hemoglobin 9.1 (*)    HCT 28.2 (*)    RDW 20.0 (*)    Lymphs Abs 0.3 (*)    All other components within normal limits  BASIC METABOLIC PANEL - Abnormal; Notable for the following:    Chloride 100 (*)    Glucose, Bld 138 (*)    BUN 26 (*)    Creatinine, Ser 1.91 (*)    GFR calc non Af Amer 31 (*)    GFR calc Af Amer 36 (*)    All other components within normal limits  RAPID INFLUENZA A&B ANTIGENS ([facility] ONLY)  BRAIN NATRIURETIC PEPTIDE   ____________________________________________   EKG  Interpreted by me Atrial fibrillation rate of 97. Normal axis intervals QRS ST segments and T waves.  ____________________________________________    RADIOLOGY  Chest x-ray shows COPD as well as changes related to CHF  ____________________________________________   PROCEDURES Procedures  ____________________________________________   INITIAL IMPRESSION / ASSESSMENT AND PLAN / ED COURSE  Pertinent labs & imaging results that were available during my care of the patient were reviewed by me and considered in my medical decision making (see chart for details).  Patient presents with shortness of breath, decreased exercise tolerance, likely due to combination of COPD and CHF exacerbation. Low suspicion for PE ACS dissection or pericarditis. After 125 of Solu-Medrol, 1 DuoNeb, 1 albuterol given by EMS and 2 albuterol treatments in the ED, patient is mildly improved on chest auscultation but still very symptomatic and not feeling much better subjectively. His oxygenation is stable on his 5 L nasal cannula at this point even though he was initially hypoxic. I have discussed with the hospitalist for further management. I'll start ceftriaxone and azithromycin due to risky. A card pneumonia with COPD and increased productive cough. IV Lasix for CHF.  Clinical Course    ____________________________________________   FINAL CLINICAL  IMPRESSION(S) / ED DIAGNOSES  Final diagnoses:  COPD exacerbation ([place])  Congestive heart failure, unspecified congestive heart failure chronicity, unspecified congestive heart failure type ([place])      New Prescriptions   No medications on file  Portions of this note were generated with dragon dictation software. Dictation errors may occur despite best attempts at proofreading.    Carrie Mew, MD 04/02/16 1556

## 2016-04-02 NOTE — ED Notes (Signed)
Called pharmacy again to send lasix

## 2016-04-02 NOTE — ED Triage Notes (Signed)
Pt arrived via ems for c/o shortness of breath since this am - pt has hx of COPD and wears O2 at home - pt increased O2 to 5L without relief of shortness of breath and he also took albuterol nebulizer without relief - ems called - ems reports on 5L of O2 pt sat 86% so increased to 10L of O2 and sat at 100% - ems gave albuterol neb/duoneb/and 125mg  of solumedrol - noted upper lungs sounds clear and lower lung sounds diminished

## 2016-04-02 NOTE — ED Notes (Signed)
Called pharmacy to send lasix due to out in pyxis

## 2016-04-02 NOTE — ED Notes (Signed)
Pt arrived via ems for c/o shortness of breath since this am - pt has hx of COPD and wears O2 at home - pt increased O2 to 5L without relief of shortness of breath and he also took albuterol nebulizer without relief - ems called - ems reports on 5L of O2 pt sat 86% so increased to 10L of O2 and sat at 100% - ems gave albuterol neb/duoneb/and 125mg  of solumedrol - noted upper lungs sounds clear and lower lung sounds diminished

## 2016-04-03 LAB — BASIC METABOLIC PANEL
ANION GAP: 11 (ref 5–15)
BUN: 33 mg/dL — AB (ref 6–20)
CHLORIDE: 98 mmol/L — AB (ref 101–111)
CO2: 30 mmol/L (ref 22–32)
Calcium: 9 mg/dL (ref 8.9–10.3)
Creatinine, Ser: 2.03 mg/dL — ABNORMAL HIGH (ref 0.61–1.24)
GFR calc Af Amer: 33 mL/min — ABNORMAL LOW (ref >60)
GFR calc non Af Amer: 29 mL/min — ABNORMAL LOW (ref >60)
Glucose, Bld: 245 mg/dL — ABNORMAL HIGH (ref 65–99)
POTASSIUM: 3.6 mmol/L (ref 3.5–5.1)
SODIUM: 139 mmol/L (ref 135–145)

## 2016-04-03 LAB — CBC
HEMATOCRIT: 27.6 % — AB (ref 40.0–52.0)
Hemoglobin: 8.9 g/dL — ABNORMAL LOW (ref 13.0–18.0)
MCH: 26.4 pg (ref 26.0–34.0)
MCHC: 32.1 g/dL (ref 32.0–36.0)
MCV: 82.1 fL (ref 80.0–100.0)
Platelets: 258 10*3/uL (ref 150–440)
RBC: 3.36 MIL/uL — ABNORMAL LOW (ref 4.40–5.90)
RDW: 20 % — AB (ref 11.5–14.5)
WBC: 7.1 10*3/uL (ref 3.8–10.6)

## 2016-04-03 LAB — EXPECTORATED SPUTUM ASSESSMENT W REFEX TO RESP CULTURE: SPECIAL REQUESTS: NORMAL

## 2016-04-03 LAB — EXPECTORATED SPUTUM ASSESSMENT W GRAM STAIN, RFLX TO RESP C

## 2016-04-03 LAB — GLUCOSE, CAPILLARY
GLUCOSE-CAPILLARY: 164 mg/dL — AB (ref 65–99)
Glucose-Capillary: 211 mg/dL — ABNORMAL HIGH (ref 65–99)
Glucose-Capillary: 199 mg/dL — ABNORMAL HIGH (ref 65–99)
Glucose-Capillary: 136 mg/dL — ABNORMAL HIGH (ref 65–99)

## 2016-04-03 MED ORDER — MENTHOL 3 MG MT LOZG
1.0000 | LOZENGE | OROMUCOSAL | Status: DC | PRN
  Administered 2016-04-03: 3 mg via ORAL
  Filled 2016-04-03: qty 9

## 2016-04-03 NOTE — Progress Notes (Signed)
[facility] [place] NAME: Patrick [name]    MR#:  [id number]  DATE OF BIRTH:  1932-01-28  SUBJECTIVE:  CHIEF COMPLAINT:   Chief Complaint  Patient presents with  . Shortness of Breath   Continues to have shortness of breath and cough. Mild improvement. On 4 L oxygen today. At baseline was 2 L at home.  REVIEW OF SYSTEMS:    Review of Systems  Constitutional: Positive for malaise/fatigue. Negative for chills and fever.  HENT: Negative for sore throat.   Eyes: Negative for blurred vision, double vision and pain.  Respiratory: Positive for cough, shortness of breath and wheezing. Negative for hemoptysis.   Cardiovascular: Negative for chest pain, palpitations, orthopnea and leg swelling.  Gastrointestinal: Negative for abdominal pain, constipation, diarrhea, heartburn, nausea and vomiting.  Genitourinary: Negative for dysuria and hematuria.  Musculoskeletal: Negative for back pain and joint pain.  Skin: Negative for rash.  Neurological: Positive for weakness. Negative for sensory change, speech change, focal weakness and headaches.  Endo/Heme/Allergies: Does not bruise/bleed easily.  Psychiatric/Behavioral: Negative for depression. The patient is not nervous/anxious.     DRUG ALLERGIES:   Allergies  Allergen Reactions  . Levofloxacin Other (See Comments)    confusion  . Prednisone Other (See Comments)    confusion    VITALS:  Blood pressure (!) 155/80, pulse 89, temperature 98.3 F (36.8 C), temperature source Oral, resp. rate 18, height 5\' 10"  (1.778 m), weight 95.3 kg (210 lb), SpO2 97 %.  PHYSICAL EXAMINATION:   Physical Exam  GENERAL:  83 y.o.-year-old patient lying in the bed with no acute distress.  EYES: Pupils equal, round, reactive to light and accommodation. No scleral icterus. Extraocular muscles intact.  HEENT: Head atraumatic, normocephalic. Oropharynx and nasopharynx clear.  NECK:  Supple, no jugular  venous distention. No thyroid enlargement, no tenderness.  LUNGS: Bilateral wheezing and crackles CARDIOVASCULAR: S1, S2 normal. No murmurs, rubs, or gallops.  ABDOMEN: Soft, nontender, nondistended. Bowel sounds present. No organomegaly or mass.  EXTREMITIES: No cyanosis, clubbing or edema b/l.    NEUROLOGIC: Cranial nerves II through XII are intact. Mild slurred speech PSYCHIATRIC: The patient is alert and oriented x 3.  SKIN: No obvious rash, lesion, or ulcer.   LABORATORY PANEL:   CBC  Recent Labs Lab 04/03/16 0635  WBC 7.1  HGB 8.9*  HCT 27.6*  PLT 258   ------------------------------------------------------------------------------------------------------------------ Chemistries   Recent Labs Lab 04/03/16 0635  NA 139  K 3.6  CL 98*  CO2 30  GLUCOSE 245*  BUN 33*  CREATININE 2.03*  CALCIUM 9.0   ------------------------------------------------------------------------------------------------------------------  Cardiac Enzymes No results for input(s): TROPONINI in the last 168 hours. ------------------------------------------------------------------------------------------------------------------  RADIOLOGY:  Dg Chest 2 View  Result Date: 04/02/2016 CLINICAL DATA:  Increasing shortness of breath since yesterday. History of CHF, atrial fibrillation, acute on chronic respiratory failure, and remote history of smoking. EXAM: CHEST  2 VIEW COMPARISON:  Chest x-ray dated PA and lateral chest x-ray of January 22, 2016 FINDINGS: The lungs are well-expanded. The interstitial markings are coarse bilaterally especially at the lung bases. The findings have not greatly changed from the previous study. The cardiac silhouette is enlarged. The pulmonary vascularity is prominent centrally. There is calcification in the wall of the aortic arch with tortuosity of the descending thoracic aorta. There are small amounts of pleural fluid layering posteriorly and laterally. IMPRESSION: COPD  with superimposed chronic CHF. There may be acute CHF superimposed upon these findings. There  is no discrete alveolar pneumonia. Electronically Signed   By: David  Martinique M.D.   On: 04/02/2016 15:17     ASSESSMENT AND PLAN:   * Acute on chronic respiratory failure due to acute bronchitis and CHF exacerbation Continue oxygen. Wean os tolerated. Nebulizers when necessary.  * Acute bronchitis -IV steroids, Antibiotics - Scheduled Nebulizers - Inhalers -Wean O2 as tolerated  * Minimal hemoptysis with specks of blood and sputum likely from his coughing and acute bronchitis. Influenza negative. No mass and chest x-ray. Resolved today.  * Acute on chronic diastolic CHF. Last ejection fraction was 60%. - IV Lasix, Beta blockers - Input and Output - Counseled to limit fluids and Salt - Monitor Bun/Cr and Potassium -Cardiology follow up after discharge.  * Permanent atrial fibrillation Continue patient's Xarelto and rate control medications from home.  * CKD stage III. Creatinine is improved compared to last admission. Monitor while being diuresed.  * Anemia of chronic disease is stable   All the records are reviewed and case discussed with Care Management/Social Workerr. Management plans discussed with the patient, family and they are in agreement.  CODE STATUS: FULL CODE  DVT Prophylaxis: SCDs  TOTAL TIME TAKING CARE OF THIS PATIENT: 35 minutes.   POSSIBLE D/C IN 1-2 DAYS, DEPENDING ON CLINICAL CONDITION.  Hillary Bow R M.D on 04/03/2016 at 10:52 AM  Between 7am to 6pm - Pager - [phone]  After 6pm go to [email or web address] - password [email or web address] [facility]  Office  [phone]  CC: Primary care physician; [place] Pink, MD  Note: This dictation was prepared with Dragon dictation along with smaller phrase technology. Any transcriptional errors that result from this process are unintentional.

## 2016-04-03 NOTE — Evaluation (Signed)
Physical Therapy Evaluation Patient Details Name: Patrick [name] MRN: [id number] DOB: Dec 18, 1931 Today's Date: 04/03/2016   History of Present Illness  presented to ER secondary to worsening SOB x2 days; admitted with acute respiratory failure related to bronchitis, CHF exacerbation.  Clinical Impression  Upon evaluation, patient alert and oriented; moderately HOH.  Eager for [place] activities with therapist, as he "hopes to go home tomorrow".  Bilat UE/LE strength and ROM grossly symmetrical and WFL for basic transfers and mobility.  Mild higher-level balance deficits, as patient with limited ability to respond to any external perturbations outside immediate BOS.  Does require use of RW for optimal safety/stability and overall energy conservation at this time. Completes bed mobility with mod indep; sit/stand, basic transfers and gait (150') with RW, cga/min assist.  Mild sway at times, but able to self-correct without external assist.  Limited insight/awareness into cardiopulmonary deficits and need for activity pacing; mod cuing from therapist for rest periods during gait trial as needed.   Did note HR elevation to 153 (peak) and desaturation to 85% with mobility efforts 3L, requiring seated rest period for recovery to baseline.  RN informed/aware of vitals response to activity. Would benefit from skilled PT to address above deficits and promote optimal return to PLOF; Recommend transition to [place] upon discharge from acute hospitalization as medically stable.     Follow Up Recommendations Home health PT    Equipment Recommendations  Rolling walker with 5" wheels    Recommendations for Other Services       Precautions / Restrictions Precautions Precautions: Fall Restrictions Weight Bearing Restrictions: No      Mobility  Bed Mobility Overal bed mobility: Modified Independent                Transfers Overall transfer level: Needs assistance Equipment used: Rolling  walker (2 wheeled) Transfers: Sit to/from Stand Sit to Stand: Min guard            Ambulation/Gait Ambulation/Gait assistance: Min guard Ambulation Distance (Feet): 150 Feet Assistive device: Rolling walker (2 wheeled)   Gait velocity: 10' walk time, 10 seconds Gait velocity interpretation: <1.8 ft/sec, indicative of risk for recurrent falls General Gait Details: reciprocal stepping with forward flexed posture; increased force of contact bilat LEs.  Notably fatigued after effort-peak HR 153, desat to 85% on 3L; requiring seated rest period for recovery to baseline.  RN informed/aware.  Stairs            Wheelchair Mobility    Modified Rankin (Stroke Patients Only)       Balance Overall balance assessment: Needs assistance Sitting-balance support: No upper extremity supported;Feet supported Sitting balance-Leahy Scale: Good     Standing balance support: Bilateral upper extremity supported Standing balance-Leahy Scale: Fair                               Pertinent Vitals/Pain Pain Assessment: No/denies pain    Home Living Family/patient expects to be discharged to:: Private residence Living Arrangements: Spouse/significant other;Children Available Help at Discharge: Family (wife works outside of home) Type of Home: House Home Access: Stairs to enter Entrance Stairs-Rails: Right Entrance Stairs-Number of Steps: [place]: One level [place]: [place] - built in;Walker - 2 wheels;Cane - quad;Grab bars - tub/shower      Prior Function Level of Independence: Independent         Comments: Indep with ADLs, household and limited community mobility  Hand Dominance        Extremity/Trunk Assessment   Upper Extremity Assessment Upper Extremity Assessment: Overall WFL for tasks assessed    Lower Extremity Assessment Lower Extremity Assessment: Overall WFL for tasks assessed (grossly at least 4/5 throughout)        Communication   Communication: HOH  Cognition Arousal/Alertness: Awake/alert Behavior During Therapy: WFL for tasks assessed/performed Overall Cognitive Status: Within Functional Limits for tasks assessed                      General Comments      Exercises     Assessment/Plan    PT Assessment Patient needs continued PT services  PT Problem List Decreased strength;Decreased range of motion;Decreased activity tolerance;Decreased balance;Decreased mobility;Decreased knowledge of use of DME;Decreased safety awareness;Decreased knowledge of precautions;Cardiopulmonary status limiting activity          PT Treatment Interventions DME instruction;Gait training;Stair training;Functional mobility training;Therapeutic activities;Therapeutic exercise;Balance training;Patient/family education    PT Goals (Current goals can be found in the Care Plan section)  Acute Rehab PT Goals Patient Stated Goal: to return home PT Goal Formulation: With patient Time For Goal Achievement: 04/17/16 Potential to Achieve Goals: Good    Frequency Min 2X/week   Barriers to discharge Decreased caregiver support      Co-evaluation               End of Session Equipment Utilized During Treatment: Gait belt;Oxygen Activity Tolerance: Treatment limited secondary to medical complications (Comment) (HR elevation, desaturation with activity) Patient left: in chair;with call bell/phone within reach;with chair alarm set Nurse Communication: Mobility status (vitals response to activity)         Time: 1103-1594 PT Time Calculation (min) (ACUTE ONLY): 21 min   Charges:   PT Evaluation $PT Eval Moderate Complexity: 1 Procedure     PT G Codes:        Fayrene Towner H. Owens Shark, PT, DPT, NCS 04/03/16, 5:26 PM [phone]

## 2016-04-04 ENCOUNTER — Ambulatory Visit: Payer: Commercial Managed Care - HMO | Admitting: Internal Medicine

## 2016-04-04 LAB — HEMOGLOBIN A1C
Hgb A1c MFr Bld: 5.7 % — ABNORMAL HIGH (ref 4.8–5.6)
MEAN PLASMA GLUCOSE: 117 mg/dL

## 2016-04-04 LAB — GLUCOSE, CAPILLARY
GLUCOSE-CAPILLARY: 180 mg/dL — AB (ref 65–99)
Glucose-Capillary: 104 mg/dL — ABNORMAL HIGH (ref 65–99)

## 2016-04-04 MED ORDER — METOPROLOL TARTRATE 25 MG PO TABS
25.0000 mg | ORAL_TABLET | Freq: Two times a day (BID) | ORAL | Status: DC
  Administered 2016-04-04: 25 mg via ORAL
  Filled 2016-04-04: qty 1

## 2016-04-04 MED ORDER — METOPROLOL TARTRATE 25 MG PO TABS: 25.0000 mg | ORAL_TABLET | Freq: Two times a day (BID) | ORAL | 0 refills | Status: DC

## 2016-04-04 MED ORDER — IPRATROPIUM-ALBUTEROL 0.5-2.5 (3) MG/3ML IN SOLN: 3.0000 mL | Freq: Four times a day (QID) | RESPIRATORY_TRACT | 0 refills | Status: DC | PRN

## 2016-04-04 MED ORDER — PREDNISONE 20 MG PO TABS: 40.0000 mg | ORAL_TABLET | Freq: Every day | ORAL | 0 refills | Status: DC

## 2016-04-04 NOTE — Discharge Instructions (Addendum)
Heart Failure Clinic appointment on April 09, 2016 at 1:30pm with Darylene Price, [place]. Please call [phone] to reschedule.    - Daily fluids < 2 liters. - Low salt diet - Check weight everyday and keep log. Take to your doctors appt. - Take extra dose of Torsemide if you gain more than 3 pounds weight.    Chronic Obstructive Pulmonary Disease Chronic obstructive pulmonary disease (COPD) is a common lung problem. In COPD, the flow of air from the lungs is limited. The way your lungs work will probably never return to normal, but there are things you can do to improve your lungs and make yourself feel better. Your doctor may treat your condition with:  Medicines.  Oxygen.  Lung surgery.  Changes to your diet.  Rehabilitation. This may involve a team of specialists. Follow these instructions at home:  Take all medicines as told by your doctor.  Avoid medicines or cough syrups that dry up your airway (such as antihistamines) and do not allow you to get rid of thick spit. You do not need to avoid them if told differently by your doctor.  If you smoke, stop. Smoking makes the problem worse.  Avoid being around things that make your breathing worse (like smoke, chemicals, and fumes).  Use oxygen therapy and therapy to help improve your lungs (pulmonary rehabilitation) if told by your doctor. If you need home oxygen therapy, ask your doctor if you should buy a tool to measure your oxygen level (oximeter).  Avoid people who have a sickness you can catch (contagious).  Avoid going outside when it is very hot, cold, or humid.  Eat healthy foods. Eat smaller meals more often. Rest before meals.  Stay active, but remember to also rest.  Make sure to get all the shots (vaccines) your doctor recommends. Ask your doctor if you need a pneumonia shot.  Learn and use tips on how to relax.  Learn and use tips on how to control your breathing as told by your doctor. Try: 1. Breathing in  (inhaling) through your nose for 1 second. Then, pucker your lips and breath out (exhale) through your lips for 2 seconds. 2. Putting one hand on your belly (abdomen). Breathe in slowly through your nose for 1 second. Your hand on your belly should move out. Pucker your lips and breathe out slowly through your lips. Your hand on your belly should move in as you breathe out.  Learn and use controlled coughing to clear thick spit from your lungs. The steps are: 1. Lean your head a little forward. 2. Breathe in deeply. 3. Try to hold your breath for 3 seconds. 4. Keep your mouth slightly open while coughing 2 times. 5. Spit any thick spit out into a tissue. 6. Rest and do the steps again 1 or 2 times as needed. Contact a doctor if:  You cough up more thick spit than usual.  There is a change in the color or thickness of the spit.  It is harder to breathe than usual.  Your breathing is faster than usual. Get help right away if:  You have shortness of breath while resting.  You have shortness of breath that stops you from:  Being able to talk.  Doing normal activities.  You chest hurts for longer than 5 minutes.  Your skin color is more blue than usual.  Your pulse oximeter shows that you have low oxygen for longer than 5 minutes. This information is not intended to replace  advice given to you by your health care provider. Make sure you discuss any questions you have with your health care provider. Document Released: 08/28/2007 Document Revised: 08/17/2015 Document Reviewed: 11/05/2012 Elsevier Interactive Patient Education  2017 [employer].

## 2016-04-04 NOTE — Plan of Care (Signed)
Problem: Pain Managment: Goal: General experience of comfort will improve Outcome: Adequate for Discharge No PRNs given overnight/Denies pain.

## 2016-04-04 NOTE — Progress Notes (Signed)
Follow-up appointment made at the [facility] Clinic on April 09, 2016 at 1:30pm. Thank you.

## 2016-04-04 NOTE — Progress Notes (Signed)
[place], [place].   04/04/2016  Patient: Patrick [name]   Date of Birth:  Apr 27, 1931  Date of admission:  04/02/2016  Date of Discharge  04/04/2016    To Whom it May Concern:   [name]  was accompanied by his Wife Patrick [name] in the hospital. It is advised she be home with him for the next 3 days to assist and care for him.  If you have any questions or concerns, please don't hesitate to call.  Sincerely,   Hillary Bow R M.D Office : [phone]   .

## 2016-04-04 NOTE — Progress Notes (Signed)
Pt d/c to home today. IV removed intact.  Rx's given to pt w/all questions and concerns addressed.  D/C paperwork reviewed and education provided with all questions and concerns addressed.  Pt family at bedside for home transport.

## 2016-04-04 NOTE — Care Management (Signed)
Patient admitted from home with Acute bronchitis.  Patient lives at home with wife and son.  Patient open with [facility] for RN.  Patient states that he has a RW, cane, and shower char in the home.  Patient states that he still drives on occasion, otherwise wife and son drive.  PT has assessed patient and recommend home health PT. Home health order placed for RN and PT.  Family would like to continue services with [facility]. Corene Cornea with [facility] notified.  RNCM signing off

## 2016-04-05 DIAGNOSIS — J9621 Acute and chronic respiratory failure with hypoxia: Secondary | ICD-10-CM | POA: Diagnosis not present

## 2016-04-05 DIAGNOSIS — N183 Chronic kidney disease, stage 3 (moderate): Secondary | ICD-10-CM | POA: Diagnosis not present

## 2016-04-05 DIAGNOSIS — I481 Persistent atrial fibrillation: Secondary | ICD-10-CM | POA: Diagnosis not present

## 2016-04-05 DIAGNOSIS — D638 Anemia in other chronic diseases classified elsewhere: Secondary | ICD-10-CM | POA: Diagnosis not present

## 2016-04-05 DIAGNOSIS — J209 Acute bronchitis, unspecified: Secondary | ICD-10-CM | POA: Diagnosis not present

## 2016-04-05 DIAGNOSIS — E1122 Type 2 diabetes mellitus with diabetic chronic kidney disease: Secondary | ICD-10-CM | POA: Diagnosis not present

## 2016-04-05 DIAGNOSIS — I130 Hypertensive heart and chronic kidney disease with heart failure and stage 1 through stage 4 chronic kidney disease, or unspecified chronic kidney disease: Secondary | ICD-10-CM | POA: Diagnosis not present

## 2016-04-05 DIAGNOSIS — I5042 Chronic combined systolic (congestive) and diastolic (congestive) heart failure: Secondary | ICD-10-CM | POA: Diagnosis not present

## 2016-04-05 DIAGNOSIS — Z9981 Dependence on supplemental oxygen: Secondary | ICD-10-CM | POA: Diagnosis not present

## 2016-04-05 LAB — CULTURE, RESPIRATORY W GRAM STAIN: Culture: NORMAL

## 2016-04-05 LAB — CULTURE, RESPIRATORY: SPECIAL REQUESTS: NORMAL

## 2016-04-09 ENCOUNTER — Ambulatory Visit: Payer: Commercial Managed Care - HMO | Admitting: Family

## 2016-04-09 DIAGNOSIS — J9621 Acute and chronic respiratory failure with hypoxia: Secondary | ICD-10-CM | POA: Diagnosis not present

## 2016-04-09 DIAGNOSIS — N183 Chronic kidney disease, stage 3 (moderate): Secondary | ICD-10-CM | POA: Diagnosis not present

## 2016-04-09 DIAGNOSIS — D638 Anemia in other chronic diseases classified elsewhere: Secondary | ICD-10-CM | POA: Diagnosis not present

## 2016-04-09 DIAGNOSIS — Z9981 Dependence on supplemental oxygen: Secondary | ICD-10-CM | POA: Diagnosis not present

## 2016-04-09 DIAGNOSIS — I481 Persistent atrial fibrillation: Secondary | ICD-10-CM | POA: Diagnosis not present

## 2016-04-09 DIAGNOSIS — I5042 Chronic combined systolic (congestive) and diastolic (congestive) heart failure: Secondary | ICD-10-CM | POA: Diagnosis not present

## 2016-04-09 DIAGNOSIS — E1122 Type 2 diabetes mellitus with diabetic chronic kidney disease: Secondary | ICD-10-CM | POA: Diagnosis not present

## 2016-04-09 DIAGNOSIS — J209 Acute bronchitis, unspecified: Secondary | ICD-10-CM | POA: Diagnosis not present

## 2016-04-09 DIAGNOSIS — I130 Hypertensive heart and chronic kidney disease with heart failure and stage 1 through stage 4 chronic kidney disease, or unspecified chronic kidney disease: Secondary | ICD-10-CM | POA: Diagnosis not present

## 2016-04-10 NOTE — Discharge Summary (Signed)
[facility] [place] NAME: Patrick [name]    MR#:  [id number]  DATE OF BIRTH:  Oct 15, 1931  DATE OF ADMISSION:  04/02/2016 ADMITTING PHYSICIAN: Hillary Bow, MD  DATE OF DISCHARGE: 04/04/2016  2:47 PM  PRIMARY CARE PHYSICIAN: Maryland Pink, MD   ADMISSION DIAGNOSIS:  COPD exacerbation ([place]) [J44.1] Congestive heart failure, unspecified congestive heart failure chronicity, unspecified congestive heart failure type ([place]) [I50.9]  DISCHARGE DIAGNOSIS:  Active Problems:   Acute bronchitis   SECONDARY DIAGNOSIS:   Past Medical History:  Diagnosis Date  . Amputated finger   . Anemia of chronic disease    a. plan of oncology to start Procrit - received during admission 12/2013  . Atypical chest pain    a. 12/2014 Neg CE - in setting of L pleural effusion.  . Bell's palsy   . Chronic combined systolic and diastolic CHF (congestive heart failure) ([place])    a. 11/2012 Echo: EF 60-65%, mod conc LVH, mildly dil LA/RA, mild Ao sclerosis w/o stenosis; b. 11/2014 Echo: EF 40-45%, prob mid-apicalanteroseptal, ant, apical HK, Gr2 DD, mod dil LA, mildly dil RA (technically difficult study); c. echo 11/2015: EF 60-65%, trivial AI, nl RV sys fxan, PASP 59; d. echo 10/17: EF 60-65%, no RWMA, mildly dilated LA, mildly reduced RV sys fxn, PASP 70  . CKD (chronic kidney disease), stage III    a. stage III-IV  . Diabetes mellitus without complication ([place])   . GERD (gastroesophageal reflux disease)   . History of gastroesophageal reflux (GERD)   . Hyperlipidemia   . Hypertension   . Permanent atrial fibrillation ([place])    a. Dx 12/2011, Rate-controlled, chronic Xarelto (renal dosing) - CHA2DS2VASc = 5.  . Pleural effusion, left    a. 12/2014 s/p thoracentesis - protein <3, LDH 123, no malignancy.     ADMITTING HISTORY  HISTORY OF PRESENT ILLNESS:  Patrick [name]  is a 83 y.o. male with a known history of Chronic diastolic CHF, chronic respiratory  failure on 2 L oxygen, atrial fibrillation, hypertension, CKD stage III presents to the emergency room complaining of 2 days of worsening shortness of breath. He has noticed cough with productive sputum with some blood. Chronic orthopnea and lower extremity edema is unchanged. Poor appetite. No nausea vomiting or abdominal pain. He tried 5 L oxygen at home and continued to feel short of breath and presented to the ER. Here patient's influenza is negative. Afebrile. Normal WBC.  Follows with Dr. Mortimer Fries.    HOSPITAL COURSE:   * Acute on chronic respiratory failure due to acute bronchitis and CHF exacerbation Continued oxygen. Oxygen weaned to 3 L at discharge Nebulizers when necessary.  * Acute bronchitis -IV steroids, Antibiotics - Scheduled Nebulizers - Inhalers Patient will be on oral prednisone at discharge.  * Minimal hemoptysis with specks of blood and sputum likely from his coughing and acute bronchitis. Influenza negative. No mass on chest x-ray. Resolved today.  * Acute on chronic diastolic CHF. Last ejection fraction was 60%. Treated with IV Lasix with close monitoring of input and output along with BUN and creatinine in the hospital. Patient will be on increased dose of Lasix at discharge. He was counseled extensively regarding daily weight check, fluid intake, salt restriction and close follow-up with cardiology.  * Permanent atrial fibrillation Continue patient's Xarelto and rate control medications from home.  * CKD stage III. Creatinine is improved compared to last admission. Monitored while being diuresed.  * Anemia of chronic  disease is stable   Stable for discharge home. CONSULTS OBTAINED:    DRUG ALLERGIES:   Allergies  Allergen Reactions  . Levofloxacin Other (See Comments)    confusion  . Prednisone Other (See Comments)    confusion    DISCHARGE MEDICATIONS:   Discharge Medication List as of 04/04/2016 11:16 AM    START taking these  medications   Details  ipratropium-albuterol (DUONEB) 0.5-2.5 (3) MG/3ML SOLN Take 3 mLs by nebulization every 6 (six) hours as needed (Shortness of breath)., Starting Thu 04/04/2016, Normal    metoprolol tartrate (LOPRESSOR) 25 MG tablet Take 1 tablet (25 mg total) by mouth 2 (two) times daily., Starting Thu 04/04/2016, Normal    predniSONE (DELTASONE) 20 MG tablet Take 2 tablets (40 mg total) by mouth daily with breakfast., Starting Thu 04/04/2016, Normal      CONTINUE these medications which have NOT CHANGED   Details  aspirin 81 MG tablet Take 81 mg by mouth daily., Historical Med    budesonide (PULMICORT) 0.25 MG/2ML nebulizer solution Take 2 mLs (0.25 mg total) by nebulization 2 (two) times daily. Dx code J55.9, Starting Wed 03/06/2016, Until Thu 03/06/2017, Normal    cloNIDine (CATAPRES) 0.1 MG tablet Take 1 tablet (0.1 mg total) by mouth daily., Starting Wed 01/24/2016, Normal    insulin lispro protamine-lispro (HUMALOG MIX 75/25) (75-25) 100 UNIT/ML SUSP injection Inject 50 Units into the skin 2 (two) times daily. Don't take if under 150, Starting Wed 01/24/2016, Normal    omeprazole (PRILOSEC) 40 MG capsule Take 1 capsule (40 mg total) by mouth daily., Starting Wed 09/20/2015, Normal    potassium chloride 20 MEQ TBCR Take 20 mEq by mouth every other day., Starting Fri 12/22/2015, Print    pravastatin (PRAVACHOL) 40 MG tablet Take 2 tablets (80 mg total) by mouth daily. Take two tablets daily., Starting Wed 09/20/2015, Normal    Rivaroxaban (XARELTO) 15 MG TABS tablet Take 1 tablet (15 mg total) by mouth daily., Starting Thu 03/14/2016, Normal    SEREVENT DISKUS 50 MCG/DOSE diskus inhaler Inhale 1 puff into the lungs 2 (two) times daily., Starting Fri 03/22/2016, Historical Med    torsemide (DEMADEX) 20 MG tablet Take 1 tablet (20 mg total) by mouth every other day., Starting Fri 12/22/2015, Print    formoterol (PERFOROMIST) 20 MCG/2ML nebulizer solution Take 2 mLs (20 mcg total) by  nebulization 2 (two) times daily. Dx code j53.9, Starting Wed 03/06/2016, Normal        Today   VITAL SIGNS:  Blood pressure (!) 154/75, pulse 96, temperature 98.6 F (37 C), temperature source Oral, resp. rate (!) 24, height 5\' 10"  (1.778 m), weight 83.6 kg (184 lb 3.2 oz), SpO2 100 %.  I/O:  No intake or output data in the 24 hours ending 04/10/16 1603  PHYSICAL EXAMINATION:  Physical Exam  GENERAL:  83 y.o.-year-old patient lying in the bed with no acute distress.  LUNGS: Normal breath sounds bilaterally, no wheezing, rales,rhonchi or crepitation. No use of accessory muscles of respiration.  CARDIOVASCULAR: S1, S2 normal. No murmurs, rubs, or gallops.  ABDOMEN: Soft, non-tender, non-distended. Bowel sounds present. No organomegaly or mass.  NEUROLOGIC: Moves all 4 extremities. PSYCHIATRIC: The patient is alert and oriented x 3.  SKIN: No obvious rash, lesion, or ulcer.   DATA REVIEW:   CBC No results for input(s): WBC, HGB, HCT, PLT in the last 168 hours.  Chemistries  No results for input(s): NA, K, CL, CO2, GLUCOSE, BUN, CREATININE, CALCIUM, MG, AST, ALT,  ALKPHOS, BILITOT in the last 168 hours.  Invalid input(s): GFRCGP  Cardiac Enzymes No results for input(s): TROPONINI in the last 168 hours.  Microbiology Results  Results for orders placed or performed during the hospital encounter of 04/02/16  Rapid Influenza A&B Antigens ([place] only)     Status: None   Collection Time: 04/02/16  3:28 PM  Result Value Ref Range Status   Influenza A ([place]) NEGATIVE NEGATIVE Final   Influenza B ([facility]) NEGATIVE NEGATIVE Final  Culture, sputum-assessment     Status: None   Collection Time: 04/03/16  8:32 AM  Result Value Ref Range Status   Specimen Description EXPECTORATED SPUTUM  Final   Special Requests Normal  Final   Sputum evaluation THIS SPECIMEN IS ACCEPTABLE FOR SPUTUM CULTURE  Final   Report Status 04/03/2016 FINAL  Final  Culture, respiratory (NON-Expectorated)      Status: None   Collection Time: 04/03/16  8:32 AM  Result Value Ref Range Status   Specimen Description EXPECTORATED SPUTUM  Final   Special Requests Normal Reflexed from [id number]  Final   Gram Stain   Final    FEW WBC PRESENT, PREDOMINANTLY MONONUCLEAR RARE GRAM POSITIVE RODS    Culture   Final    Consistent with normal respiratory flora. Performed at [facility]    Report Status 04/05/2016 FINAL  Final    RADIOLOGY:  No results found.  Follow up with PCP in 1 week.  Management plans discussed with the patient, family and they are in agreement.  CODE STATUS:  Code Status History    Date Active Date Inactive Code Status Order ID Comments User Context   04/02/2016  4:32 PM 04/04/2016  5:52 PM Full Code [id number]  Hillary Bow, MD ED   01/24/2016  9:34 AM 01/24/2016  8:11 PM Full Code [id number]  Shary Key, RN Inpatient   01/22/2016  4:41 PM 01/24/2016  9:32 AM DNR [id number]  Demetrios Loll, MD Inpatient   12/21/2015  2:25 AM 12/21/2015  5:33 PM Full Code [id number]  Harvie Bridge, DO ED   11/20/2015 10:33 PM 11/22/2015  8:09 PM Full Code [id number]  Lance Coon, MD Inpatient   12/23/2014 11:44 AM 12/26/2014  7:40 PM Partial Code [id number]  Hillary Bow, MD Inpatient   12/23/2014  1:27 AM 12/23/2014 11:44 AM Full Code [id number]  Juluis Mire, MD Inpatient    Ad[facility] Directive Documentation   Flowsheet Row Most Recent Value  Type of Advance Directive  Healthcare Power of Attorney, Living will  Pre-existing out of facility DNR order (yellow form or pink MOST form)  No data  "MOST" Form in Place?  No data      TOTAL TIME TAKING CARE OF THIS PATIENT ON DAY OF DISCHARGE: more than 30 minutes.   Hillary Bow R M.D on 04/10/2016 at 4:03 PM  Between 7am to 6pm - Pager - [phone]  After 6pm go to [email or web address] - password [email or web address] [place]  Office  [phone]  CC: Primary care physician; Maryland Pink, MD  Note: This dictation was  prepared with Dragon dictation along with smaller phrase technology. Any transcriptional errors that result from this process are unintentional.

## 2016-04-11 DIAGNOSIS — Z9981 Dependence on supplemental oxygen: Secondary | ICD-10-CM | POA: Diagnosis not present

## 2016-04-11 DIAGNOSIS — J209 Acute bronchitis, unspecified: Secondary | ICD-10-CM | POA: Diagnosis not present

## 2016-04-11 DIAGNOSIS — D638 Anemia in other chronic diseases classified elsewhere: Secondary | ICD-10-CM | POA: Diagnosis not present

## 2016-04-11 DIAGNOSIS — J9621 Acute and chronic respiratory failure with hypoxia: Secondary | ICD-10-CM | POA: Diagnosis not present

## 2016-04-11 DIAGNOSIS — I130 Hypertensive heart and chronic kidney disease with heart failure and stage 1 through stage 4 chronic kidney disease, or unspecified chronic kidney disease: Secondary | ICD-10-CM | POA: Diagnosis not present

## 2016-04-11 DIAGNOSIS — E1122 Type 2 diabetes mellitus with diabetic chronic kidney disease: Secondary | ICD-10-CM | POA: Diagnosis not present

## 2016-04-11 DIAGNOSIS — I481 Persistent atrial fibrillation: Secondary | ICD-10-CM | POA: Diagnosis not present

## 2016-04-11 DIAGNOSIS — N183 Chronic kidney disease, stage 3 (moderate): Secondary | ICD-10-CM | POA: Diagnosis not present

## 2016-04-11 DIAGNOSIS — I5042 Chronic combined systolic (congestive) and diastolic (congestive) heart failure: Secondary | ICD-10-CM | POA: Diagnosis not present

## 2016-04-12 DIAGNOSIS — J209 Acute bronchitis, unspecified: Secondary | ICD-10-CM | POA: Diagnosis not present

## 2016-04-12 DIAGNOSIS — J9621 Acute and chronic respiratory failure with hypoxia: Secondary | ICD-10-CM | POA: Diagnosis not present

## 2016-04-12 DIAGNOSIS — I130 Hypertensive heart and chronic kidney disease with heart failure and stage 1 through stage 4 chronic kidney disease, or unspecified chronic kidney disease: Secondary | ICD-10-CM | POA: Diagnosis not present

## 2016-04-12 DIAGNOSIS — N183 Chronic kidney disease, stage 3 (moderate): Secondary | ICD-10-CM | POA: Diagnosis not present

## 2016-04-12 DIAGNOSIS — D638 Anemia in other chronic diseases classified elsewhere: Secondary | ICD-10-CM | POA: Diagnosis not present

## 2016-04-12 DIAGNOSIS — I5042 Chronic combined systolic (congestive) and diastolic (congestive) heart failure: Secondary | ICD-10-CM | POA: Diagnosis not present

## 2016-04-12 DIAGNOSIS — Z9981 Dependence on supplemental oxygen: Secondary | ICD-10-CM | POA: Diagnosis not present

## 2016-04-12 DIAGNOSIS — E1122 Type 2 diabetes mellitus with diabetic chronic kidney disease: Secondary | ICD-10-CM | POA: Diagnosis not present

## 2016-04-12 DIAGNOSIS — I481 Persistent atrial fibrillation: Secondary | ICD-10-CM | POA: Diagnosis not present

## 2016-04-15 ENCOUNTER — Telehealth: Payer: Self-pay | Admitting: Cardiovascular Disease

## 2016-04-15 ENCOUNTER — Ambulatory Visit: Payer: Medicare HMO | Attending: Family | Admitting: Family

## 2016-04-15 ENCOUNTER — Encounter: Payer: Self-pay | Admitting: Family

## 2016-04-15 VITALS — BP 118/67 | HR 70 | Resp 18 | Ht 67.0 in | Wt 191.1 lb

## 2016-04-15 DIAGNOSIS — Z7902 Long term (current) use of antithrombotics/antiplatelets: Secondary | ICD-10-CM | POA: Diagnosis not present

## 2016-04-15 DIAGNOSIS — N183 Chronic kidney disease, stage 3 unspecified: Secondary | ICD-10-CM

## 2016-04-15 DIAGNOSIS — D638 Anemia in other chronic diseases classified elsewhere: Secondary | ICD-10-CM | POA: Insufficient documentation

## 2016-04-15 DIAGNOSIS — Z8249 Family history of ischemic heart disease and other diseases of the circulatory system: Secondary | ICD-10-CM | POA: Diagnosis not present

## 2016-04-15 DIAGNOSIS — Z7982 Long term (current) use of aspirin: Secondary | ICD-10-CM | POA: Insufficient documentation

## 2016-04-15 DIAGNOSIS — I481 Persistent atrial fibrillation: Secondary | ICD-10-CM | POA: Diagnosis not present

## 2016-04-15 DIAGNOSIS — I5042 Chronic combined systolic (congestive) and diastolic (congestive) heart failure: Secondary | ICD-10-CM | POA: Insufficient documentation

## 2016-04-15 DIAGNOSIS — Z87891 Personal history of nicotine dependence: Secondary | ICD-10-CM | POA: Insufficient documentation

## 2016-04-15 DIAGNOSIS — I4819 Other persistent atrial fibrillation: Secondary | ICD-10-CM

## 2016-04-15 DIAGNOSIS — E785 Hyperlipidemia, unspecified: Secondary | ICD-10-CM | POA: Insufficient documentation

## 2016-04-15 DIAGNOSIS — G510 Bell's palsy: Secondary | ICD-10-CM | POA: Insufficient documentation

## 2016-04-15 DIAGNOSIS — N184 Chronic kidney disease, stage 4 (severe): Secondary | ICD-10-CM | POA: Insufficient documentation

## 2016-04-15 DIAGNOSIS — I130 Hypertensive heart and chronic kidney disease with heart failure and stage 1 through stage 4 chronic kidney disease, or unspecified chronic kidney disease: Secondary | ICD-10-CM | POA: Insufficient documentation

## 2016-04-15 DIAGNOSIS — Z992 Dependence on renal dialysis: Secondary | ICD-10-CM | POA: Insufficient documentation

## 2016-04-15 DIAGNOSIS — Z89029 Acquired absence of unspecified finger(s): Secondary | ICD-10-CM | POA: Diagnosis not present

## 2016-04-15 DIAGNOSIS — Z881 Allergy status to other antibiotic agents status: Secondary | ICD-10-CM | POA: Diagnosis not present

## 2016-04-15 DIAGNOSIS — K219 Gastro-esophageal reflux disease without esophagitis: Secondary | ICD-10-CM | POA: Diagnosis not present

## 2016-04-15 DIAGNOSIS — Z888 Allergy status to other drugs, medicaments and biological substances status: Secondary | ICD-10-CM | POA: Insufficient documentation

## 2016-04-15 DIAGNOSIS — I482 Chronic atrial fibrillation: Secondary | ICD-10-CM | POA: Diagnosis not present

## 2016-04-15 DIAGNOSIS — R809 Proteinuria, unspecified: Secondary | ICD-10-CM | POA: Diagnosis not present

## 2016-04-15 DIAGNOSIS — Z794 Long term (current) use of insulin: Secondary | ICD-10-CM | POA: Insufficient documentation

## 2016-04-15 DIAGNOSIS — I129 Hypertensive chronic kidney disease with stage 1 through stage 4 chronic kidney disease, or unspecified chronic kidney disease: Secondary | ICD-10-CM | POA: Diagnosis not present

## 2016-04-15 DIAGNOSIS — Z833 Family history of diabetes mellitus: Secondary | ICD-10-CM | POA: Insufficient documentation

## 2016-04-15 DIAGNOSIS — Z9889 Other specified postprocedural states: Secondary | ICD-10-CM | POA: Diagnosis not present

## 2016-04-15 DIAGNOSIS — E1122 Type 2 diabetes mellitus with diabetic chronic kidney disease: Secondary | ICD-10-CM | POA: Insufficient documentation

## 2016-04-15 DIAGNOSIS — I10 Essential (primary) hypertension: Secondary | ICD-10-CM

## 2016-04-15 DIAGNOSIS — I5032 Chronic diastolic (congestive) heart failure: Secondary | ICD-10-CM

## 2016-04-15 MED ORDER — OMEPRAZOLE 40 MG PO CPDR: 40.0000 mg | DELAYED_RELEASE_CAPSULE | Freq: Every day | ORAL | 3 refills | Status: DC

## 2016-04-15 MED ORDER — CLONIDINE HCL 0.1 MG PO TABS: 0.1000 mg | ORAL_TABLET | Freq: Two times a day (BID) | ORAL | 3 refills | Status: DC

## 2016-04-15 NOTE — Patient Instructions (Signed)
Continue weighing daily and call for an overnight weight gain of > 2 pounds or a weekly weight gain of >5 pounds. 

## 2016-04-15 NOTE — Telephone Encounter (Signed)
Spoke w/ Patrick [name].  She states that pt's health is declining and she wants to make sure that he is receiving the best care that he can.  She will contact pt's PCP to discuss whether hospice would be an option for pt or if pt would qualify for home health. Asked her to call back if we can be of further assistance.

## 2016-04-15 NOTE — Progress Notes (Signed)
Subjective:    Patient ID: Patrick [name], male    DOB: [birth year], 83 y.o.   MRN: [id number]  HPI  Patrick [name] is a 83 y/o male with a history of chronic kidney disease (stage III-IV), diabetes on insulin, HTN, persistent atrial fibrillation, hyperlipidemia, anemia, remote tobacco use and chronic diastolic heart failure.  Last echo done 12/21/15 which showed an EF of 60-65%without any aortic stenosis and trivial tricuspid regurgitation. This is an improvement from his previous echocardiogram which showed an EF of 40-45%.  Recently admitted on 04/02/16 with acute bronchitis. Treated with IV steroids, antibiotics and nebulizers. Discharged home with prednisone. Previous admission was on 01/22/16 for acute on chronic heart failure. Patient was diuresed and cardiology consult obtained. Discharged in 2 days. Also admitted on 12/20/15 with exacerbation of heart failure. Was IV diuresed, was seen by cardiology and discharged home. Torsemide ordered for every other day.   Returns today for a f/u. Weighing himself daily and weight chart shows an overall stable weight. Does endorse being tired with little exertion along with experiencing some shortness of breath. Slight edema in his lower legs but tends to be above the sock line. Denies any chest pain or palpitations. Continues to have difficulty sleeping at night but does nap during the day. Glucose has been running a little high but he just finished his prednisone on 04/12/16.  Past Medical History:  Diagnosis Date  . Amputated finger   . Anemia of chronic disease    a. plan of oncology to start Procrit - received during admission 12/2013  . Atypical chest pain    a. 12/2014 Neg CE - in setting of L pleural effusion.  . Bell's palsy   . Chronic combined systolic and diastolic CHF (congestive heart failure) ([place])    a. 11/2012 Echo: EF 60-65%, mod conc LVH, mildly dil LA/RA, mild Ao sclerosis w/o stenosis; b. 11/2014 Echo: EF 40-45%, prob  mid-apicalanteroseptal, ant, apical HK, Gr2 DD, mod dil LA, mildly dil RA (technically difficult study); c. echo 11/2015: EF 60-65%, trivial AI, nl RV sys fxan, PASP 59; d. echo 10/17: EF 60-65%, no RWMA, mildly dilated LA, mildly reduced RV sys fxn, PASP 70  . CKD (chronic kidney disease), stage III    a. stage III-IV  . Diabetes mellitus without complication ([place])   . GERD (gastroesophageal reflux disease)   . History of gastroesophageal reflux (GERD)   . Hyperlipidemia   . Hypertension   . Permanent atrial fibrillation ([place])    a. Dx 12/2011, Rate-controlled, chronic Xarelto (renal dosing) - CHA2DS2VASc = 5.  . Pleural effusion, left    a. 12/2014 s/p thoracentesis - protein <3, LDH 123, no malignancy.   Past Surgical History:  Procedure Laterality Date  . APPENDECTOMY    . COLONOSCOPY  09/2011  . ESOPHAGOGASTRODUODENOSCOPY    . FINGER SURGERY     right hand   Family History  Problem Relation Age of Onset  . Heart attack Brother   . Diabetes Mother   . Diabetes Sister   . Hypertension     Social History  Substance Use Topics  . Smoking status: Former Smoker    Packs/day: 0.25    Years: 10.00    Types: Cigars    Quit date: 05/06/1966  . Smokeless tobacco: Never Used  . Alcohol use No   Allergies  Allergen Reactions  . Levofloxacin Other (See Comments)    confusion  . Prednisone Other (See Comments)    confusion  Prior to Admission medications   Medication Sig Start Date End Date Taking? Authorizing Provider  aspirin 81 MG tablet Take 81 mg by mouth daily.   Yes Historical Provider, MD  budesonide (PULMICORT) 0.25 MG/2ML nebulizer solution Take 2 mLs (0.25 mg total) by nebulization 2 (two) times daily. Dx code J44.9 03/06/16 03/06/17 Yes Flora Lipps, MD  cloNIDine (CATAPRES) 0.1 MG tablet Take 1 tablet (0.1 mg total) by mouth 2 (two) times daily. 04/15/16  Yes Alisa Graff, FNP  formoterol (PERFOROMIST) 20 MCG/2ML nebulizer solution Take 2 mLs (20 mcg total) by  nebulization 2 (two) times daily. Dx code j44.9 03/06/16  Yes Flora Lipps, MD  insulin lispro protamine-lispro (HUMALOG MIX 75/25) (75-25) 100 UNIT/ML SUSP injection Inject 50 Units into the skin 2 (two) times daily. Don't take if under 150 01/24/16  Yes Fritzi Mandes, MD  ipratropium-albuterol (DUONEB) 0.5-2.5 (3) MG/3ML SOLN Take 3 mLs by nebulization every 6 (six) hours as needed (Shortness of breath). 04/04/16  Yes Srikar Sudini, MD  metoprolol tartrate (LOPRESSOR) 25 MG tablet Take 1 tablet (25 mg total) by mouth 2 (two) times daily. 04/04/16  Yes Srikar Sudini, MD  omeprazole (PRILOSEC) 40 MG capsule Take 1 capsule (40 mg total) by mouth daily. 04/15/16  Yes Alisa Graff, FNP  potassium chloride 20 MEQ TBCR Take 20 mEq by mouth every other day. 12/22/15  Yes Henreitta Leber, MD  Rivaroxaban (XARELTO) 15 MG TABS tablet Take 1 tablet (15 mg total) by mouth daily. 03/14/16  Yes Minna Merritts, MD  SEREVENT DISKUS 50 MCG/DOSE diskus inhaler Inhale 1 puff into the lungs 2 (two) times daily. 03/22/16  Yes Historical Provider, MD  torsemide (DEMADEX) 20 MG tablet Take 1 tablet (20 mg total) by mouth every other day. Patient taking differently: Take 20 mg by mouth every other day. As needed on Tues/Thurs for fluid 12/22/15  Yes Henreitta Leber, MD     Review of Systems  Constitutional: Positive for fatigue. Negative for appetite change.  HENT: Positive for hearing loss. Negative for congestion, rhinorrhea and sore throat.   Eyes: Negative.   Respiratory: Positive for shortness of breath. Negative for cough, chest tightness and wheezing.   Cardiovascular: Positive for leg swelling. Negative for chest pain and palpitations.  Gastrointestinal: Negative for abdominal distention and abdominal pain.  Endocrine: Negative.   Genitourinary: Negative.   Musculoskeletal: Negative for back pain and neck pain.  Skin: Negative.   Allergic/Immunologic: Negative.   Neurological: Negative for dizziness and  light-headedness.  Hematological: Negative for adenopathy. Does not bruise/bleed easily.  Psychiatric/Behavioral: Positive for sleep disturbance (sleeping some during the day). Negative for dysphoric mood. The patient is not nervous/anxious.    Vitals:   04/15/16 1346  BP: 118/67  Pulse: 70  Resp: 18  SpO2: 95%  Weight: 191 lb 2 oz (86.7 kg)  Height: 5\' 7"  (1.702 m)   Wt Readings from Last 3 Encounters:  04/15/16 191 lb 2 oz (86.7 kg)  04/04/16 184 lb 3.2 oz (83.6 kg)  03/12/16 197 lb (89.4 kg)   Lab Results  Component Value Date   CREATININE 2.03 (H) 04/03/2016   CREATININE 1.91 (H) 04/02/2016   CREATININE 2.53 (H) 02/05/2016       Objective:   Physical Exam  Constitutional: He is oriented to person, place, and time. He appears well-developed and well-nourished.  HENT:  Head: Normocephalic and atraumatic.  Eyes: Conjunctivae are normal. Pupils are equal, round, and reactive to light.  Neck: Normal  range of motion. Neck supple. No JVD present.  Cardiovascular: Normal rate.  An irregular rhythm present.  Pulmonary/Chest: Effort normal. He has no wheezes. He has no rales.  Abdominal: Soft. He exhibits no distension. There is no tenderness.  Musculoskeletal: He exhibits edema (1+ edema above the sock line bilaterally). He exhibits no tenderness.  Neurological: He is alert and oriented to person, place, and time.  Skin: Skin is warm and dry.  Psychiatric: He has a normal mood and affect. His behavior is normal. Thought content normal.  Nursing note and vitals reviewed.     Assessment & Plan:  1: Chronic heart failure with preserved ejection fraction-  - NYHA class III - euvolemic today - Continue daily weights and call for an overnight weight gain of >2 pounds or weekly weight gain of >5 pounds. Does take torsemide every other day. Home weight chart reviewed and home weights are stable - elevate legs when sitting at home. Just finished prednisone taper - not adding salt  to his food - last saw cardiologist [name]) on 03/04/16 - has home health nurse and PT coming twice a week  2: HTN- -Blood pressure looks good -Home blood pressure readings reviewed and are elevated at times in the mornings. Limited with medications due to renal function - Wife is to continue checking blood pressure.  3: Persistent atrial fibrillation- - rate controlled at this time on metoprolol - no bleeding noted with xarelto  4: CKD, stage III-IV- -Follows closely with nephrology  -Remains uninterested in dialysis - glucose today was 201 (again, just finished prednisone)  Medication bottles were reviewed.  Return here in 3 months or sooner for any questions/problems before then

## 2016-04-15 NOTE — Telephone Encounter (Signed)
Pt daughter calling wanting to know Dr Donivan Scull opinion on patient going into Hospice Please advise

## 2016-04-16 DIAGNOSIS — I5042 Chronic combined systolic (congestive) and diastolic (congestive) heart failure: Secondary | ICD-10-CM | POA: Diagnosis not present

## 2016-04-16 DIAGNOSIS — I130 Hypertensive heart and chronic kidney disease with heart failure and stage 1 through stage 4 chronic kidney disease, or unspecified chronic kidney disease: Secondary | ICD-10-CM | POA: Diagnosis not present

## 2016-04-16 DIAGNOSIS — D638 Anemia in other chronic diseases classified elsewhere: Secondary | ICD-10-CM | POA: Diagnosis not present

## 2016-04-16 DIAGNOSIS — E1122 Type 2 diabetes mellitus with diabetic chronic kidney disease: Secondary | ICD-10-CM | POA: Diagnosis not present

## 2016-04-16 DIAGNOSIS — N183 Chronic kidney disease, stage 3 (moderate): Secondary | ICD-10-CM | POA: Diagnosis not present

## 2016-04-16 DIAGNOSIS — I481 Persistent atrial fibrillation: Secondary | ICD-10-CM | POA: Diagnosis not present

## 2016-04-16 DIAGNOSIS — J9621 Acute and chronic respiratory failure with hypoxia: Secondary | ICD-10-CM | POA: Diagnosis not present

## 2016-04-16 DIAGNOSIS — J209 Acute bronchitis, unspecified: Secondary | ICD-10-CM | POA: Diagnosis not present

## 2016-04-16 DIAGNOSIS — Z9981 Dependence on supplemental oxygen: Secondary | ICD-10-CM | POA: Diagnosis not present

## 2016-04-17 DIAGNOSIS — I5032 Chronic diastolic (congestive) heart failure: Secondary | ICD-10-CM | POA: Diagnosis not present

## 2016-04-17 DIAGNOSIS — H9193 Unspecified hearing loss, bilateral: Secondary | ICD-10-CM | POA: Diagnosis not present

## 2016-04-18 DIAGNOSIS — J9621 Acute and chronic respiratory failure with hypoxia: Secondary | ICD-10-CM | POA: Diagnosis not present

## 2016-04-18 DIAGNOSIS — D638 Anemia in other chronic diseases classified elsewhere: Secondary | ICD-10-CM | POA: Diagnosis not present

## 2016-04-18 DIAGNOSIS — N183 Chronic kidney disease, stage 3 (moderate): Secondary | ICD-10-CM | POA: Diagnosis not present

## 2016-04-18 DIAGNOSIS — J209 Acute bronchitis, unspecified: Secondary | ICD-10-CM | POA: Diagnosis not present

## 2016-04-18 DIAGNOSIS — I5042 Chronic combined systolic (congestive) and diastolic (congestive) heart failure: Secondary | ICD-10-CM | POA: Diagnosis not present

## 2016-04-18 DIAGNOSIS — Z9981 Dependence on supplemental oxygen: Secondary | ICD-10-CM | POA: Diagnosis not present

## 2016-04-18 DIAGNOSIS — E1122 Type 2 diabetes mellitus with diabetic chronic kidney disease: Secondary | ICD-10-CM | POA: Diagnosis not present

## 2016-04-18 DIAGNOSIS — I481 Persistent atrial fibrillation: Secondary | ICD-10-CM | POA: Diagnosis not present

## 2016-04-18 DIAGNOSIS — I130 Hypertensive heart and chronic kidney disease with heart failure and stage 1 through stage 4 chronic kidney disease, or unspecified chronic kidney disease: Secondary | ICD-10-CM | POA: Diagnosis not present

## 2016-04-19 DIAGNOSIS — D638 Anemia in other chronic diseases classified elsewhere: Secondary | ICD-10-CM | POA: Diagnosis not present

## 2016-04-19 DIAGNOSIS — J9621 Acute and chronic respiratory failure with hypoxia: Secondary | ICD-10-CM | POA: Diagnosis not present

## 2016-04-19 DIAGNOSIS — Z9981 Dependence on supplemental oxygen: Secondary | ICD-10-CM | POA: Diagnosis not present

## 2016-04-19 DIAGNOSIS — N183 Chronic kidney disease, stage 3 (moderate): Secondary | ICD-10-CM | POA: Diagnosis not present

## 2016-04-19 DIAGNOSIS — I481 Persistent atrial fibrillation: Secondary | ICD-10-CM | POA: Diagnosis not present

## 2016-04-19 DIAGNOSIS — E1122 Type 2 diabetes mellitus with diabetic chronic kidney disease: Secondary | ICD-10-CM | POA: Diagnosis not present

## 2016-04-19 DIAGNOSIS — J209 Acute bronchitis, unspecified: Secondary | ICD-10-CM | POA: Diagnosis not present

## 2016-04-19 DIAGNOSIS — I130 Hypertensive heart and chronic kidney disease with heart failure and stage 1 through stage 4 chronic kidney disease, or unspecified chronic kidney disease: Secondary | ICD-10-CM | POA: Diagnosis not present

## 2016-04-19 DIAGNOSIS — I5042 Chronic combined systolic (congestive) and diastolic (congestive) heart failure: Secondary | ICD-10-CM | POA: Diagnosis not present

## 2016-04-23 DIAGNOSIS — I5042 Chronic combined systolic (congestive) and diastolic (congestive) heart failure: Secondary | ICD-10-CM | POA: Diagnosis not present

## 2016-04-23 DIAGNOSIS — Z9981 Dependence on supplemental oxygen: Secondary | ICD-10-CM | POA: Diagnosis not present

## 2016-04-23 DIAGNOSIS — D638 Anemia in other chronic diseases classified elsewhere: Secondary | ICD-10-CM | POA: Diagnosis not present

## 2016-04-23 DIAGNOSIS — E1122 Type 2 diabetes mellitus with diabetic chronic kidney disease: Secondary | ICD-10-CM | POA: Diagnosis not present

## 2016-04-23 DIAGNOSIS — I481 Persistent atrial fibrillation: Secondary | ICD-10-CM | POA: Diagnosis not present

## 2016-04-23 DIAGNOSIS — N183 Chronic kidney disease, stage 3 (moderate): Secondary | ICD-10-CM | POA: Diagnosis not present

## 2016-04-23 DIAGNOSIS — J9621 Acute and chronic respiratory failure with hypoxia: Secondary | ICD-10-CM | POA: Diagnosis not present

## 2016-04-23 DIAGNOSIS — J209 Acute bronchitis, unspecified: Secondary | ICD-10-CM | POA: Diagnosis not present

## 2016-04-23 DIAGNOSIS — I130 Hypertensive heart and chronic kidney disease with heart failure and stage 1 through stage 4 chronic kidney disease, or unspecified chronic kidney disease: Secondary | ICD-10-CM | POA: Diagnosis not present

## 2016-04-24 DIAGNOSIS — J9621 Acute and chronic respiratory failure with hypoxia: Secondary | ICD-10-CM | POA: Diagnosis not present

## 2016-04-24 DIAGNOSIS — I5042 Chronic combined systolic (congestive) and diastolic (congestive) heart failure: Secondary | ICD-10-CM | POA: Diagnosis not present

## 2016-04-24 DIAGNOSIS — N183 Chronic kidney disease, stage 3 (moderate): Secondary | ICD-10-CM | POA: Diagnosis not present

## 2016-04-24 DIAGNOSIS — E1122 Type 2 diabetes mellitus with diabetic chronic kidney disease: Secondary | ICD-10-CM | POA: Diagnosis not present

## 2016-04-24 DIAGNOSIS — Z9981 Dependence on supplemental oxygen: Secondary | ICD-10-CM | POA: Diagnosis not present

## 2016-04-24 DIAGNOSIS — J209 Acute bronchitis, unspecified: Secondary | ICD-10-CM | POA: Diagnosis not present

## 2016-04-24 DIAGNOSIS — I130 Hypertensive heart and chronic kidney disease with heart failure and stage 1 through stage 4 chronic kidney disease, or unspecified chronic kidney disease: Secondary | ICD-10-CM | POA: Diagnosis not present

## 2016-04-24 DIAGNOSIS — D638 Anemia in other chronic diseases classified elsewhere: Secondary | ICD-10-CM | POA: Diagnosis not present

## 2016-04-24 DIAGNOSIS — I481 Persistent atrial fibrillation: Secondary | ICD-10-CM | POA: Diagnosis not present

## 2016-04-26 DIAGNOSIS — E1122 Type 2 diabetes mellitus with diabetic chronic kidney disease: Secondary | ICD-10-CM | POA: Diagnosis not present

## 2016-04-26 DIAGNOSIS — I481 Persistent atrial fibrillation: Secondary | ICD-10-CM | POA: Diagnosis not present

## 2016-04-26 DIAGNOSIS — Z9981 Dependence on supplemental oxygen: Secondary | ICD-10-CM | POA: Diagnosis not present

## 2016-04-26 DIAGNOSIS — D638 Anemia in other chronic diseases classified elsewhere: Secondary | ICD-10-CM | POA: Diagnosis not present

## 2016-04-26 DIAGNOSIS — J209 Acute bronchitis, unspecified: Secondary | ICD-10-CM | POA: Diagnosis not present

## 2016-04-26 DIAGNOSIS — J9601 Acute respiratory failure with hypoxia: Secondary | ICD-10-CM | POA: Diagnosis not present

## 2016-04-26 DIAGNOSIS — I130 Hypertensive heart and chronic kidney disease with heart failure and stage 1 through stage 4 chronic kidney disease, or unspecified chronic kidney disease: Secondary | ICD-10-CM | POA: Diagnosis not present

## 2016-04-26 DIAGNOSIS — I5042 Chronic combined systolic (congestive) and diastolic (congestive) heart failure: Secondary | ICD-10-CM | POA: Diagnosis not present

## 2016-04-26 DIAGNOSIS — J9621 Acute and chronic respiratory failure with hypoxia: Secondary | ICD-10-CM | POA: Diagnosis not present

## 2016-04-26 DIAGNOSIS — N183 Chronic kidney disease, stage 3 (moderate): Secondary | ICD-10-CM | POA: Diagnosis not present

## 2016-05-01 ENCOUNTER — Ambulatory Visit: Payer: Commercial Managed Care - HMO | Admitting: Oncology

## 2016-05-01 ENCOUNTER — Other Ambulatory Visit: Payer: Commercial Managed Care - HMO

## 2016-05-01 ENCOUNTER — Ambulatory Visit: Payer: Commercial Managed Care - HMO

## 2016-05-01 DIAGNOSIS — N183 Chronic kidney disease, stage 3 (moderate): Secondary | ICD-10-CM | POA: Diagnosis not present

## 2016-05-01 DIAGNOSIS — I481 Persistent atrial fibrillation: Secondary | ICD-10-CM | POA: Diagnosis not present

## 2016-05-01 DIAGNOSIS — E1122 Type 2 diabetes mellitus with diabetic chronic kidney disease: Secondary | ICD-10-CM | POA: Diagnosis not present

## 2016-05-01 DIAGNOSIS — Z9981 Dependence on supplemental oxygen: Secondary | ICD-10-CM | POA: Diagnosis not present

## 2016-05-01 DIAGNOSIS — D638 Anemia in other chronic diseases classified elsewhere: Secondary | ICD-10-CM | POA: Diagnosis not present

## 2016-05-01 DIAGNOSIS — J209 Acute bronchitis, unspecified: Secondary | ICD-10-CM | POA: Diagnosis not present

## 2016-05-01 DIAGNOSIS — I130 Hypertensive heart and chronic kidney disease with heart failure and stage 1 through stage 4 chronic kidney disease, or unspecified chronic kidney disease: Secondary | ICD-10-CM | POA: Diagnosis not present

## 2016-05-01 DIAGNOSIS — I5042 Chronic combined systolic (congestive) and diastolic (congestive) heart failure: Secondary | ICD-10-CM | POA: Diagnosis not present

## 2016-05-01 DIAGNOSIS — J9621 Acute and chronic respiratory failure with hypoxia: Secondary | ICD-10-CM | POA: Diagnosis not present

## 2016-05-02 DIAGNOSIS — H903 Sensorineural hearing loss, bilateral: Secondary | ICD-10-CM | POA: Diagnosis not present

## 2016-05-08 DIAGNOSIS — J9621 Acute and chronic respiratory failure with hypoxia: Secondary | ICD-10-CM | POA: Diagnosis not present

## 2016-05-08 DIAGNOSIS — E1122 Type 2 diabetes mellitus with diabetic chronic kidney disease: Secondary | ICD-10-CM | POA: Diagnosis not present

## 2016-05-08 DIAGNOSIS — N183 Chronic kidney disease, stage 3 (moderate): Secondary | ICD-10-CM | POA: Diagnosis not present

## 2016-05-08 DIAGNOSIS — D638 Anemia in other chronic diseases classified elsewhere: Secondary | ICD-10-CM | POA: Diagnosis not present

## 2016-05-08 DIAGNOSIS — I5042 Chronic combined systolic (congestive) and diastolic (congestive) heart failure: Secondary | ICD-10-CM | POA: Diagnosis not present

## 2016-05-08 DIAGNOSIS — I130 Hypertensive heart and chronic kidney disease with heart failure and stage 1 through stage 4 chronic kidney disease, or unspecified chronic kidney disease: Secondary | ICD-10-CM | POA: Diagnosis not present

## 2016-05-08 DIAGNOSIS — J209 Acute bronchitis, unspecified: Secondary | ICD-10-CM | POA: Diagnosis not present

## 2016-05-08 DIAGNOSIS — I481 Persistent atrial fibrillation: Secondary | ICD-10-CM | POA: Diagnosis not present

## 2016-05-08 DIAGNOSIS — Z9981 Dependence on supplemental oxygen: Secondary | ICD-10-CM | POA: Diagnosis not present

## 2016-05-08 NOTE — Progress Notes (Signed)
[facility]  [phone] [phone]) [phone]  ID: Patrick [name] OB: Dec 05, 1931  MR#: [id number]  KNL#:[id number]  Patient Care Team: [place] Pink, MD as PCP - General (Family Medicine) Alisa Graff, FNP as Nurse Practitioner (Family Medicine) Rise Mu, PA-C as Physician Assistant (Cardiology) Lloyd Huger, MD as Consulting Physician (Hematology) Lavonia Dana, MD as Consulting Physician (Nephrology) Judi Cong, MD as Physician Assistant (Endocrinology)  CHIEF COMPLAINT:  Iron deficiency anemia, anemia secondary to chronic renal failure.  INTERVAL HISTORY: Patient returns to clinic today for further evaluation, laboratory work, and consideration of continuation of Feraheme or Procrit. He has had recent hospital admission for worsening CHF. He continues to have increased weakness and fatigue. He also continues to have chronic shortness of breath.  He has no neurologic complaints. He denies any weight loss. He denies any recent fevers. He denies any chest pain. He has no nausea, vomiting, constipation, or diarrhea. He has no urinary complaints. Patient offers no further specific complaints.   REVIEW OF SYSTEMS:   Review of Systems  Constitutional: Positive for malaise/fatigue. Negative for fever and weight loss.  Respiratory: Positive for cough.   Cardiovascular: Negative.  Negative for chest pain and leg swelling.  Gastrointestinal: Negative.  Negative for abdominal pain.  Genitourinary: Negative.   Musculoskeletal: Negative.   Neurological: Positive for weakness.  Psychiatric/Behavioral: Negative.  The patient is not nervous/anxious.     As per HPI. Otherwise, a complete review of systems is negative.  PAST MEDICAL HISTORY: Past Medical History:  Diagnosis Date  . Amputated finger   . Anemia of chronic disease    a. plan of oncology to start Procrit - received during admission 12/2013  . Atypical chest pain    a. 12/2014 Neg  CE - in setting of L pleural effusion.  . Bell's palsy   . Chronic combined systolic and diastolic CHF (congestive heart failure) ([place])    a. 11/2012 Echo: EF 60-65%, mod conc LVH, mildly dil LA/RA, mild Ao sclerosis w/o stenosis; b. 11/2014 Echo: EF 40-45%, prob mid-apicalanteroseptal, ant, apical HK, Gr2 DD, mod dil LA, mildly dil RA (technically difficult study); c. echo 11/2015: EF 60-65%, trivial AI, nl RV sys fxan, PASP 59; d. echo 10/17: EF 60-65%, no RWMA, mildly dilated LA, mildly reduced RV sys fxn, PASP 70  . CKD (chronic kidney disease), stage III    a. stage III-IV  . Diabetes mellitus without complication ([place])   . GERD (gastroesophageal reflux disease)   . History of gastroesophageal reflux (GERD)   . Hyperlipidemia   . Hypertension   . Permanent atrial fibrillation ([place])    a. Dx 12/2011, Rate-controlled, chronic Xarelto (renal dosing) - CHA2DS2VASc = 5.  . Pleural effusion, left    a. 12/2014 s/p thoracentesis - protein <3, LDH 123, no malignancy.    PAST SURGICAL HISTORY: Past Surgical History:  Procedure Laterality Date  . APPENDECTOMY    . COLONOSCOPY  09/2011  . ESOPHAGOGASTRODUODENOSCOPY    . FINGER SURGERY     right hand    FAMILY HISTORY Family History  Problem Relation Age of Onset  . Heart attack Brother   . Diabetes Mother   . Diabetes Sister   . Hypertension         ADVANCED DIRECTIVES:    HEALTH MAINTENANCE: Social History  Substance Use Topics  . Smoking status: Former Smoker    Packs/day: 0.25    Years: 10.00    Types: Cigars  Quit date: 05/06/1966  . Smokeless tobacco: Never Used  . Alcohol use No    Allergies  Allergen Reactions  . Levofloxacin Other (See Comments)    confusion  . Prednisone Other (See Comments)    confusion    Current Outpatient Prescriptions  Medication Sig Dispense Refill  . aspirin 81 MG tablet Take 81 mg by mouth daily.    . budesonide (PULMICORT) 0.25 MG/2ML nebulizer solution Take 2 mLs (0.25 mg  total) by nebulization 2 (two) times daily. Dx code J44.9 345 mL 3  . cloNIDine (CATAPRES) 0.1 MG tablet Take 1 tablet (0.1 mg total) by mouth 2 (two) times daily. 180 tablet 3  . formoterol (PERFOROMIST) 20 MCG/2ML nebulizer solution Take 2 mLs (20 mcg total) by nebulization 2 (two) times daily. Dx code j44.9 340 mL 3  . insulin lispro protamine-lispro (HUMALOG MIX 75/25) (75-25) 100 UNIT/ML SUSP injection Inject 50 Units into the skin 2 (two) times daily. Don't take if under 150 10 mL 11  . ipratropium-albuterol (DUONEB) 0.5-2.5 (3) MG/3ML SOLN Take 3 mLs by nebulization every 6 (six) hours as needed (Shortness of breath). 360 mL 0  . metoprolol tartrate (LOPRESSOR) 25 MG tablet Take 1 tablet (25 mg total) by mouth 2 (two) times daily. 60 tablet 0  . omeprazole (PRILOSEC) 40 MG capsule Take 1 capsule (40 mg total) by mouth daily. 90 capsule 3  . potassium chloride 20 MEQ TBCR Take 20 mEq by mouth every other day. 30 tablet 1  . Rivaroxaban (XARELTO) 15 MG TABS tablet Take 1 tablet (15 mg total) by mouth daily. 90 tablet 3  . SEREVENT DISKUS 50 MCG/DOSE diskus inhaler Inhale 1 puff into the lungs 2 (two) times daily.    Marland Kitchen torsemide (DEMADEX) 20 MG tablet Take 1 tablet (20 mg total) by mouth every other day. (Patient taking differently: Take 20 mg by mouth every other day. As needed on Tues/Thurs for fluid) 60 tablet 1   No current facility-administered medications for this visit.    Facility-Administered Medications Ordered in Other Visits  Medication Dose Route Frequency Provider Last Rate Last Dose  . epoetin alfa (EPOGEN,PROCRIT) injection 40,000 Units  40,000 Units Subcutaneous Once Lloyd Huger, MD        OBJECTIVE: Vitals:   05/09/16 1440  BP: (!) 153/70  Pulse: 76  Temp: 98 F (36.7 C)     Body mass index is 30.37 kg/m.    ECOG FS:1 - Symptomatic but completely ambulatory  General: Well-developed, well-nourished, no acute distress. Eyes: anicteric sclera. Lungs: Clear to  auscultation bilaterally. Heart: Regular rate and rhythm. No rubs, murmurs, or gallops. Abdomen: Soft, nontender, nondistended. No organomegaly noted, normoactive bowel sounds. Musculoskeletal: No edema, cyanosis, or clubbing. Neuro: Alert, answering all questions appropriately. Cranial nerves grossly intact. Skin: No rashes or petechiae noted. Psych: Normal affect.   LAB RESULTS:  Lab Results  Component Value Date   NA 139 04/03/2016   K 3.6 04/03/2016   CL 98 (L) 04/03/2016   CO2 30 04/03/2016   GLUCOSE 245 (H) 04/03/2016   BUN 33 (H) 04/03/2016   CREATININE 2.03 (H) 04/03/2016   CALCIUM 9.0 04/03/2016   PROT 7.3 12/20/2015   ALBUMIN 3.3 (L) 12/20/2015   AST 26 12/20/2015   ALT 15 (L) 12/20/2015   ALKPHOS 87 12/20/2015   BILITOT 0.9 12/20/2015   GFRNONAA 29 (L) 04/03/2016   GFRAA 33 (L) 04/03/2016    Lab Results  Component Value Date   WBC 7.9 05/09/2016  NEUTROABS 6.4 05/09/2016   HGB 7.3 (L) 05/09/2016   HCT 23.5 (L) 05/09/2016   MCV 78.1 (L) 05/09/2016   PLT 310 05/09/2016   Lab Results  Component Value Date   IRON 19 (L) 05/09/2016   TIBC 378 05/09/2016   IRONPCTSAT 5 (L) 05/09/2016    Lab Results  Component Value Date   FERRITIN 40 05/09/2016     STUDIES: No results found.  ASSESSMENT: Iron deficiency anemia, anemia secondary to chronic renal failure.  PLAN:    1. Iron deficiency anemia, anemia secondary to chronic renal failure: Patient continues have decreased hemoglobin and iron stores, therefore will proceed with an additional 510 mg IV Feraheme today. Will hold Procrit for now until patient's iron stores have been repleted. Return to clinic in 2 weeks for repeat laboratory work and further evaluation. 2. Lingula mass/mediastinal lymphadenopathy: Despite concern for underlying malignancy, possibly lymphoma, patient previously elected not to pursue biopsy and continue simple observation. Peripheral blood flow cytometry is negative. He has an  appointment with pulmonary in approximately one week for further evaluation. Patient's wife reports a repeat CT scan is scheduled in the near future, but there does not appear to be one on the schedule.  3. Chronic renal insufficiency: Creatinine slightly worse, treatment per nephrology. 4. CHF: Continue evaluation and treatment per cardiology.  Patient expressed understanding and was in agreement with this plan. He also understands that He can call clinic at any time with any questions, concerns, or complaints.    Lloyd Huger, MD   05/11/2016 4:43 PM

## 2016-05-09 ENCOUNTER — Inpatient Hospital Stay: Payer: Medicare HMO | Attending: Oncology

## 2016-05-09 ENCOUNTER — Inpatient Hospital Stay: Payer: Medicare HMO

## 2016-05-09 ENCOUNTER — Inpatient Hospital Stay (HOSPITAL_BASED_OUTPATIENT_CLINIC_OR_DEPARTMENT_OTHER): Payer: Medicare HMO | Admitting: Oncology

## 2016-05-09 DIAGNOSIS — K219 Gastro-esophageal reflux disease without esophagitis: Secondary | ICD-10-CM | POA: Diagnosis not present

## 2016-05-09 DIAGNOSIS — D631 Anemia in chronic kidney disease: Secondary | ICD-10-CM | POA: Diagnosis not present

## 2016-05-09 DIAGNOSIS — Z7901 Long term (current) use of anticoagulants: Secondary | ICD-10-CM | POA: Insufficient documentation

## 2016-05-09 DIAGNOSIS — E785 Hyperlipidemia, unspecified: Secondary | ICD-10-CM | POA: Insufficient documentation

## 2016-05-09 DIAGNOSIS — Z87891 Personal history of nicotine dependence: Secondary | ICD-10-CM | POA: Diagnosis not present

## 2016-05-09 DIAGNOSIS — I482 Chronic atrial fibrillation: Secondary | ICD-10-CM

## 2016-05-09 DIAGNOSIS — N183 Chronic kidney disease, stage 3 unspecified: Secondary | ICD-10-CM

## 2016-05-09 DIAGNOSIS — Z794 Long term (current) use of insulin: Secondary | ICD-10-CM | POA: Diagnosis not present

## 2016-05-09 DIAGNOSIS — I5042 Chronic combined systolic (congestive) and diastolic (congestive) heart failure: Secondary | ICD-10-CM | POA: Diagnosis not present

## 2016-05-09 DIAGNOSIS — Z79899 Other long term (current) drug therapy: Secondary | ICD-10-CM | POA: Diagnosis not present

## 2016-05-09 DIAGNOSIS — D638 Anemia in other chronic diseases classified elsewhere: Secondary | ICD-10-CM

## 2016-05-09 DIAGNOSIS — E1122 Type 2 diabetes mellitus with diabetic chronic kidney disease: Secondary | ICD-10-CM | POA: Diagnosis not present

## 2016-05-09 DIAGNOSIS — D508 Other iron deficiency anemias: Secondary | ICD-10-CM

## 2016-05-09 DIAGNOSIS — I130 Hypertensive heart and chronic kidney disease with heart failure and stage 1 through stage 4 chronic kidney disease, or unspecified chronic kidney disease: Secondary | ICD-10-CM

## 2016-05-09 DIAGNOSIS — Z7982 Long term (current) use of aspirin: Secondary | ICD-10-CM | POA: Insufficient documentation

## 2016-05-09 LAB — CBC WITH DIFFERENTIAL/PLATELET
Basophils Absolute: 0 10*3/uL (ref 0–0.1)
Basophils Relative: 0 %
EOS ABS: 0.1 10*3/uL (ref 0–0.7)
EOS PCT: 2 %
HCT: 23.5 % — ABNORMAL LOW (ref 40.0–52.0)
Hemoglobin: 7.3 g/dL — ABNORMAL LOW (ref 13.0–18.0)
LYMPHS ABS: 0.8 10*3/uL — AB (ref 1.0–3.6)
Lymphocytes Relative: 10 %
MCH: 24.1 pg — AB (ref 26.0–34.0)
MCHC: 30.9 g/dL — ABNORMAL LOW (ref 32.0–36.0)
MCV: 78.1 fL — AB (ref 80.0–100.0)
MONO ABS: 0.5 10*3/uL (ref 0.2–1.0)
Monocytes Relative: 6 %
Neutro Abs: 6.4 10*3/uL (ref 1.4–6.5)
Neutrophils Relative %: 82 %
PLATELETS: 310 10*3/uL (ref 150–440)
RBC: 3.01 MIL/uL — ABNORMAL LOW (ref 4.40–5.90)
RDW: 19.6 % — AB (ref 11.5–14.5)
WBC: 7.9 10*3/uL (ref 3.8–10.6)

## 2016-05-09 LAB — IRON AND TIBC
IRON: 19 ug/dL — AB (ref 45–182)
Saturation Ratios: 5 % — ABNORMAL LOW (ref 17.9–39.5)
TIBC: 378 ug/dL (ref 250–450)
UIBC: 359 ug/dL

## 2016-05-09 LAB — FERRITIN: FERRITIN: 40 ng/mL (ref 24–336)

## 2016-05-09 MED ORDER — SODIUM CHLORIDE 0.9 % IV SOLN
Freq: Once | INTRAVENOUS | Status: AC
  Administered 2016-05-09: 15:00:00 via INTRAVENOUS
  Filled 2016-05-09: qty 1000

## 2016-05-09 MED ORDER — SODIUM CHLORIDE 0.9 % IV SOLN
510.0000 mg | Freq: Once | INTRAVENOUS | Status: AC
  Administered 2016-05-09: 510 mg via INTRAVENOUS
  Filled 2016-05-09: qty 17

## 2016-05-09 NOTE — Progress Notes (Signed)
No changes since last appointment. 

## 2016-05-10 ENCOUNTER — Telehealth: Payer: Self-pay | Admitting: *Deleted

## 2016-05-10 NOTE — Telephone Encounter (Signed)
Dr. Mortimer Fries supposedly ordered one, so i'm not sure.

## 2016-05-10 NOTE — Telephone Encounter (Signed)
I called Dr Mortimer Fries office and they said he is only to see MD and if XR needed after seeing MD it will be ordered. I returned call to [facility] and explained this to her and she was grateful for the clarification

## 2016-05-10 NOTE — Telephone Encounter (Signed)
States her father thought he was to have a CT Scan next Friday, but there is no order or appt is in computer. Please advise

## 2016-05-11 DIAGNOSIS — D509 Iron deficiency anemia, unspecified: Secondary | ICD-10-CM | POA: Insufficient documentation

## 2016-05-11 DIAGNOSIS — D631 Anemia in chronic kidney disease: Secondary | ICD-10-CM | POA: Insufficient documentation

## 2016-05-11 DIAGNOSIS — N183 Chronic kidney disease, stage 3 (moderate): Secondary | ICD-10-CM

## 2016-05-15 DIAGNOSIS — J9621 Acute and chronic respiratory failure with hypoxia: Secondary | ICD-10-CM | POA: Diagnosis not present

## 2016-05-15 DIAGNOSIS — I5042 Chronic combined systolic (congestive) and diastolic (congestive) heart failure: Secondary | ICD-10-CM | POA: Diagnosis not present

## 2016-05-15 DIAGNOSIS — I130 Hypertensive heart and chronic kidney disease with heart failure and stage 1 through stage 4 chronic kidney disease, or unspecified chronic kidney disease: Secondary | ICD-10-CM | POA: Diagnosis not present

## 2016-05-15 DIAGNOSIS — E1122 Type 2 diabetes mellitus with diabetic chronic kidney disease: Secondary | ICD-10-CM | POA: Diagnosis not present

## 2016-05-15 DIAGNOSIS — N183 Chronic kidney disease, stage 3 (moderate): Secondary | ICD-10-CM | POA: Diagnosis not present

## 2016-05-15 DIAGNOSIS — Z9981 Dependence on supplemental oxygen: Secondary | ICD-10-CM | POA: Diagnosis not present

## 2016-05-15 DIAGNOSIS — D638 Anemia in other chronic diseases classified elsewhere: Secondary | ICD-10-CM | POA: Diagnosis not present

## 2016-05-15 DIAGNOSIS — J209 Acute bronchitis, unspecified: Secondary | ICD-10-CM | POA: Diagnosis not present

## 2016-05-15 DIAGNOSIS — I481 Persistent atrial fibrillation: Secondary | ICD-10-CM | POA: Diagnosis not present

## 2016-05-17 ENCOUNTER — Ambulatory Visit: Payer: Commercial Managed Care - HMO | Admitting: Internal Medicine

## 2016-05-17 ENCOUNTER — Ambulatory Visit (INDEPENDENT_AMBULATORY_CARE_PROVIDER_SITE_OTHER): Payer: Medicare HMO | Admitting: Pulmonary Disease

## 2016-05-17 ENCOUNTER — Encounter: Payer: Self-pay | Admitting: Pulmonary Disease

## 2016-05-17 VITALS — BP 130/78 | HR 77 | Wt 182.0 lb

## 2016-05-17 DIAGNOSIS — J9611 Chronic respiratory failure with hypoxia: Secondary | ICD-10-CM

## 2016-05-17 DIAGNOSIS — I272 Pulmonary hypertension, unspecified: Secondary | ICD-10-CM

## 2016-05-17 DIAGNOSIS — Z7189 Other specified counseling: Secondary | ICD-10-CM | POA: Diagnosis not present

## 2016-05-17 NOTE — Patient Instructions (Signed)
Continue all of the current therapies as presently prescribed  Hospice referral made  Follow pu in 2-3 months with Dr Mortimer Fries and with Chest Xray at that time

## 2016-05-18 NOTE — Progress Notes (Signed)
PULMONARY OFFICE FOLLOW UP NOTE  PROBLEMS:  Chronic hypoxemic respiratory failure Pulmonary hypertension Chronic, severe debilitation   DATA: CT chest 01/24/16: There is a nodular opacity in the inferior most aspect of the lingula measuring 1.8 x 1.1 cm, slightly larger than on prior study. This finding, coupled with the adenopathy noted above, warrants further evaluation. Apparent rounded atelectasis in the posterior left base. A degree of superimposed pneumonia in this area cannot be excluded. Underlying emphysematous change.  There is bibasilar atelectasis. Multifocal adenopathy, slightly progressed from prior study PET 02/21/16: Diffuse mediastinal lymphadenopathy shows hypermetabolic activity and minimal progression since 12/22/2014 exam. Mild hypermetabolic nodal activity in left hilum. Differential diagnosis includes inflammatory etiologies and chronic lymphocytic leukemia/ lymphoma Echocardiogram 01/22/16: Mild LVH with normal LV contraction. Aortic sclerosis without significant stenosis. Mild RV hypertrophy and mildly reduced RV contraction in the setting of moderate to severe pulmonary hypertension (RVSP est 70 mmHg). Mild biatrial enlargement. Spirometry 03/05/16: severe restriction  INTERVAL HISTORY: Pt of DK. Last seen in office 03/05/16 when decision to forgo bronchoscopy was made based on poor functional status. Hospitalized 01/09 - 04/04/16 to hospitalist service with discharge diagnoses of acute on chronic hypoxemic respiratory failure due to acute bronchitis, CHF, CAF, CKD  SUBJ: This appointment was originally scheduled as routine follow up with Dr Mortimer Fries who is not in the office today. Pt is in his usual state of poor health. He is largely wheelchair bound and suffers class IV dyspnea. He indicates that he is improved since his recent hospitalization. He notes that dyspnea seems to be worse in evenings. Denies CP, fever, purulent sputum, hemoptysis, LE edema and calf  tenderness.  OBJ: Vitals:   05/17/16 1019  BP: 130/78  Pulse: 77  SpO2: 98%  Weight: 182 lb (82.6 kg)  [place] 3 LPM  Extremely frail, in WC, no overt distress.  HEENT normal JVP cannot be assessed No wheezes Rate controlled, soft systolic M NABS, soft 4+ symmetric pretibial edema No focal neuro deficits   Current Outpatient Prescriptions on File Prior to Visit  Medication Sig Dispense Refill  . aspirin 81 MG tablet Take 81 mg by mouth daily.    . budesonide (PULMICORT) 0.25 MG/2ML nebulizer solution Take 2 mLs (0.25 mg total) by nebulization 2 (two) times daily. Dx code J44.9 345 mL 3  . cloNIDine (CATAPRES) 0.1 MG tablet Take 1 tablet (0.1 mg total) by mouth 2 (two) times daily. 180 tablet 3  . formoterol (PERFOROMIST) 20 MCG/2ML nebulizer solution Take 2 mLs (20 mcg total) by nebulization 2 (two) times daily. Dx code j44.9 340 mL 3  . insulin lispro protamine-lispro (HUMALOG MIX 75/25) (75-25) 100 UNIT/ML SUSP injection Inject 50 Units into the skin 2 (two) times daily. Don't take if under 150 10 mL 11  . ipratropium-albuterol (DUONEB) 0.5-2.5 (3) MG/3ML SOLN Take 3 mLs by nebulization every 6 (six) hours as needed (Shortness of breath). 360 mL 0  . metoprolol tartrate (LOPRESSOR) 25 MG tablet Take 1 tablet (25 mg total) by mouth 2 (two) times daily. 60 tablet 0  . omeprazole (PRILOSEC) 40 MG capsule Take 1 capsule (40 mg total) by mouth daily. 90 capsule 3  . potassium chloride 20 MEQ TBCR Take 20 mEq by mouth every other day. 30 tablet 1  . Rivaroxaban (XARELTO) 15 MG TABS tablet Take 1 tablet (15 mg total) by mouth daily. 90 tablet 3  . SEREVENT DISKUS 50 MCG/DOSE diskus inhaler Inhale 1 puff into the lungs 2 (two) times daily.    Marland Kitchen  torsemide (DEMADEX) 20 MG tablet Take 1 tablet (20 mg total) by mouth every other day. (Patient taking differently: Take 20 mg by mouth every other day. As needed on Tues/Thurs for fluid) 60 tablet 1   Current Facility-Administered Medications on File  Prior to Visit  Medication Dose Route Frequency Provider Last Rate Last Dose  . epoetin alfa (EPOGEN,PROCRIT) injection 40,000 Units  40,000 Units Subcutaneous Once Lloyd Huger, MD        DATA: BMP Latest Ref Rng & Units 04/03/2016 04/02/2016 02/05/2016  Glucose 65 - 99 mg/dL 245(H) 138(H) 169(H)  BUN 6 - 20 mg/dL 33(H) 26(H) 30(H)  Creatinine 0.61 - 1.24 mg/dL 2.03(H) 1.91(H) 2.53(H)  BUN/Creat Ratio 10 - 22 - - -  Sodium 135 - 145 mmol/L 139 137 137  Potassium 3.5 - 5.1 mmol/L 3.6 3.5 4.3  Chloride 101 - 111 mmol/L 98(L) 100(L) 100(L)  CO2 22 - 32 mmol/L 30 30 27   Calcium 8.9 - 10.3 mg/dL 9.0 9.0 9.0   CBC Latest Ref Rng & Units 05/09/2016 04/03/2016 04/02/2016  WBC 3.8 - 10.6 K/uL 7.9 7.1 6.6  Hemoglobin 13.0 - 18.0 g/dL 7.3(L) 8.9(L) 9.1(L)  Hematocrit 40.0 - 52.0 % 23.5(L) 27.6(L) 28.2(L)  Platelets 150 - 440 K/uL 310 258 248    CXR 04/02/16: No acute findings. Appearance of chronic HF  IMPRESSION: 1) Chronic hypoxemic respiratory failure 2) Severe Pulmonary hypertension 3) Biventricular HF 4) CAF 5) Profound and chronic debilitation  PLAN: I have reviewed his current medical therapies and make no changes. Most importantly, we discussed goals of care in light of his multiple chronic, disabling and irreversible conditions. We discussed EOL decisions. I encouraged that he discuss further with his family and directed them that ACLS or mechanical ventilation would not benefit hm in terms of restoring him to a QOL that would be satisfactory to him.   Hospice referral made after above discussion  F/U in 2-3 months with DK and with CXR prior   Merton Border, MD PCCM service Mobile [phone] Pager [phone] 05/18/2016

## 2016-05-20 ENCOUNTER — Telehealth: Payer: Self-pay | Admitting: Pulmonary Disease

## 2016-05-20 ENCOUNTER — Telehealth: Payer: Self-pay | Admitting: *Deleted

## 2016-05-20 NOTE — Telephone Encounter (Signed)
Daughter called back to say that she did not know why he was taken off the schedule. Writer explained the process for Hospice Care. They are meeting with Hospice tomorrow. If she feels that they want to continue treatment she will call the office.

## 2016-05-20 NOTE — Telephone Encounter (Signed)
Spoke with Langley Gauss with Hospice and gave verbal certification for hospice care per DS. Nothing further needed.

## 2016-05-20 NOTE — Telephone Encounter (Signed)
Patrick [name] with [facility] needs order for Hospice Care and oral certification from nurse.

## 2016-05-20 NOTE — Telephone Encounter (Signed)
Daughter called to report that they have not met with hospice yet and wants to know if she can get his 05/23/2016 appointments put back on the schedule.

## 2016-05-22 ENCOUNTER — Telehealth: Payer: Self-pay | Admitting: Pulmonary Disease

## 2016-05-22 DIAGNOSIS — J9621 Acute and chronic respiratory failure with hypoxia: Secondary | ICD-10-CM | POA: Diagnosis not present

## 2016-05-22 DIAGNOSIS — I5042 Chronic combined systolic (congestive) and diastolic (congestive) heart failure: Secondary | ICD-10-CM | POA: Diagnosis not present

## 2016-05-22 DIAGNOSIS — I481 Persistent atrial fibrillation: Secondary | ICD-10-CM | POA: Diagnosis not present

## 2016-05-22 DIAGNOSIS — D638 Anemia in other chronic diseases classified elsewhere: Secondary | ICD-10-CM | POA: Diagnosis not present

## 2016-05-22 DIAGNOSIS — Z9981 Dependence on supplemental oxygen: Secondary | ICD-10-CM | POA: Diagnosis not present

## 2016-05-22 DIAGNOSIS — I130 Hypertensive heart and chronic kidney disease with heart failure and stage 1 through stage 4 chronic kidney disease, or unspecified chronic kidney disease: Secondary | ICD-10-CM | POA: Diagnosis not present

## 2016-05-22 DIAGNOSIS — J209 Acute bronchitis, unspecified: Secondary | ICD-10-CM | POA: Diagnosis not present

## 2016-05-22 DIAGNOSIS — E1122 Type 2 diabetes mellitus with diabetic chronic kidney disease: Secondary | ICD-10-CM | POA: Diagnosis not present

## 2016-05-22 DIAGNOSIS — N183 Chronic kidney disease, stage 3 (moderate): Secondary | ICD-10-CM | POA: Diagnosis not present

## 2016-05-22 NOTE — Telephone Encounter (Signed)
[name] with Hospice calling stating nurse went out to patient yesterday and pt told them he needs a little time to think about him starting to use their care Just wanted to let us know  She will follow up with patient in a few days.

## 2016-05-22 NOTE — Telephone Encounter (Signed)
Tried to call wife but she was unavailable. FYI from Hospice as stated below.

## 2016-05-23 ENCOUNTER — Ambulatory Visit: Payer: Medicare HMO | Admitting: Oncology

## 2016-05-23 ENCOUNTER — Ambulatory Visit: Payer: Medicare HMO

## 2016-05-23 ENCOUNTER — Inpatient Hospital Stay: Payer: Medicare HMO

## 2016-05-23 ENCOUNTER — Inpatient Hospital Stay: Payer: Medicare HMO | Admitting: Oncology

## 2016-05-23 ENCOUNTER — Other Ambulatory Visit: Payer: Medicare HMO

## 2016-05-24 DIAGNOSIS — I5042 Chronic combined systolic (congestive) and diastolic (congestive) heart failure: Secondary | ICD-10-CM | POA: Diagnosis not present

## 2016-05-24 DIAGNOSIS — I481 Persistent atrial fibrillation: Secondary | ICD-10-CM | POA: Diagnosis not present

## 2016-05-24 DIAGNOSIS — J9601 Acute respiratory failure with hypoxia: Secondary | ICD-10-CM | POA: Diagnosis not present

## 2016-05-24 DIAGNOSIS — N183 Chronic kidney disease, stage 3 (moderate): Secondary | ICD-10-CM | POA: Diagnosis not present

## 2016-05-27 NOTE — Telephone Encounter (Signed)
Patrick [name] with Hospice calling stating she did a check up with patient to see if they wanted the services Pt states he has their card and will call them if he needs them but at the moment  He does not need them [facility]

## 2016-05-27 NOTE — Telephone Encounter (Signed)
FYI.  Pt denied hospice.

## 2016-05-29 DIAGNOSIS — N183 Chronic kidney disease, stage 3 (moderate): Secondary | ICD-10-CM | POA: Diagnosis not present

## 2016-05-29 DIAGNOSIS — Z9981 Dependence on supplemental oxygen: Secondary | ICD-10-CM | POA: Diagnosis not present

## 2016-05-29 DIAGNOSIS — J209 Acute bronchitis, unspecified: Secondary | ICD-10-CM | POA: Diagnosis not present

## 2016-05-29 DIAGNOSIS — I481 Persistent atrial fibrillation: Secondary | ICD-10-CM | POA: Diagnosis not present

## 2016-05-29 DIAGNOSIS — E1122 Type 2 diabetes mellitus with diabetic chronic kidney disease: Secondary | ICD-10-CM | POA: Diagnosis not present

## 2016-05-29 DIAGNOSIS — D638 Anemia in other chronic diseases classified elsewhere: Secondary | ICD-10-CM | POA: Diagnosis not present

## 2016-05-29 DIAGNOSIS — I5042 Chronic combined systolic (congestive) and diastolic (congestive) heart failure: Secondary | ICD-10-CM | POA: Diagnosis not present

## 2016-05-29 DIAGNOSIS — I130 Hypertensive heart and chronic kidney disease with heart failure and stage 1 through stage 4 chronic kidney disease, or unspecified chronic kidney disease: Secondary | ICD-10-CM | POA: Diagnosis not present

## 2016-05-29 DIAGNOSIS — J9621 Acute and chronic respiratory failure with hypoxia: Secondary | ICD-10-CM | POA: Diagnosis not present

## 2016-05-30 ENCOUNTER — Telehealth: Payer: Self-pay | Admitting: Pulmonary Disease

## 2016-05-30 DIAGNOSIS — I272 Pulmonary hypertension, unspecified: Secondary | ICD-10-CM | POA: Diagnosis not present

## 2016-05-30 DIAGNOSIS — I5042 Chronic combined systolic (congestive) and diastolic (congestive) heart failure: Secondary | ICD-10-CM | POA: Diagnosis not present

## 2016-05-30 DIAGNOSIS — N183 Chronic kidney disease, stage 3 (moderate): Secondary | ICD-10-CM | POA: Diagnosis not present

## 2016-05-30 DIAGNOSIS — E1122 Type 2 diabetes mellitus with diabetic chronic kidney disease: Secondary | ICD-10-CM | POA: Diagnosis not present

## 2016-05-30 DIAGNOSIS — Z9981 Dependence on supplemental oxygen: Secondary | ICD-10-CM | POA: Diagnosis not present

## 2016-05-30 DIAGNOSIS — Z7901 Long term (current) use of anticoagulants: Secondary | ICD-10-CM | POA: Diagnosis not present

## 2016-05-30 DIAGNOSIS — J8410 Pulmonary fibrosis, unspecified: Secondary | ICD-10-CM | POA: Diagnosis not present

## 2016-05-30 DIAGNOSIS — I481 Persistent atrial fibrillation: Secondary | ICD-10-CM | POA: Diagnosis not present

## 2016-05-30 DIAGNOSIS — J449 Chronic obstructive pulmonary disease, unspecified: Secondary | ICD-10-CM | POA: Diagnosis not present

## 2016-05-30 DIAGNOSIS — I130 Hypertensive heart and chronic kidney disease with heart failure and stage 1 through stage 4 chronic kidney disease, or unspecified chronic kidney disease: Secondary | ICD-10-CM | POA: Diagnosis not present

## 2016-05-30 DIAGNOSIS — J9611 Chronic respiratory failure with hypoxia: Secondary | ICD-10-CM | POA: Diagnosis not present

## 2016-05-30 NOTE — Telephone Encounter (Signed)
FYI. I gave the ok to use the previous order for Hospice we sent in and then the pt refused. Patrick [name] states wife called them today and requested they come out due to pt's breathing getting worse. Informed her to let us know if there is anything we need to sign to get this started.

## 2016-05-30 NOTE — Telephone Encounter (Signed)
[name] with Hospice calling stating pt wife called them today Asking if they can start the hospice care starting today [name] is asking if they can use the same order we originally sent  Please advise.

## 2016-05-31 DIAGNOSIS — N183 Chronic kidney disease, stage 3 (moderate): Secondary | ICD-10-CM | POA: Diagnosis not present

## 2016-05-31 DIAGNOSIS — J449 Chronic obstructive pulmonary disease, unspecified: Secondary | ICD-10-CM | POA: Diagnosis not present

## 2016-05-31 DIAGNOSIS — J9611 Chronic respiratory failure with hypoxia: Secondary | ICD-10-CM | POA: Diagnosis not present

## 2016-05-31 DIAGNOSIS — I130 Hypertensive heart and chronic kidney disease with heart failure and stage 1 through stage 4 chronic kidney disease, or unspecified chronic kidney disease: Secondary | ICD-10-CM | POA: Diagnosis not present

## 2016-05-31 DIAGNOSIS — I5042 Chronic combined systolic (congestive) and diastolic (congestive) heart failure: Secondary | ICD-10-CM | POA: Diagnosis not present

## 2016-05-31 DIAGNOSIS — E1122 Type 2 diabetes mellitus with diabetic chronic kidney disease: Secondary | ICD-10-CM | POA: Diagnosis not present

## 2016-05-31 NOTE — Telephone Encounter (Signed)
Noted  Patrick [name]

## 2016-06-04 DIAGNOSIS — I5042 Chronic combined systolic (congestive) and diastolic (congestive) heart failure: Secondary | ICD-10-CM | POA: Diagnosis not present

## 2016-06-04 DIAGNOSIS — J9611 Chronic respiratory failure with hypoxia: Secondary | ICD-10-CM | POA: Diagnosis not present

## 2016-06-04 DIAGNOSIS — J449 Chronic obstructive pulmonary disease, unspecified: Secondary | ICD-10-CM | POA: Diagnosis not present

## 2016-06-04 DIAGNOSIS — I130 Hypertensive heart and chronic kidney disease with heart failure and stage 1 through stage 4 chronic kidney disease, or unspecified chronic kidney disease: Secondary | ICD-10-CM | POA: Diagnosis not present

## 2016-06-04 DIAGNOSIS — E1122 Type 2 diabetes mellitus with diabetic chronic kidney disease: Secondary | ICD-10-CM | POA: Diagnosis not present

## 2016-06-04 DIAGNOSIS — N183 Chronic kidney disease, stage 3 (moderate): Secondary | ICD-10-CM | POA: Diagnosis not present

## 2016-06-07 DIAGNOSIS — I5042 Chronic combined systolic (congestive) and diastolic (congestive) heart failure: Secondary | ICD-10-CM | POA: Diagnosis not present

## 2016-06-07 DIAGNOSIS — I130 Hypertensive heart and chronic kidney disease with heart failure and stage 1 through stage 4 chronic kidney disease, or unspecified chronic kidney disease: Secondary | ICD-10-CM | POA: Diagnosis not present

## 2016-06-07 DIAGNOSIS — E1122 Type 2 diabetes mellitus with diabetic chronic kidney disease: Secondary | ICD-10-CM | POA: Diagnosis not present

## 2016-06-07 DIAGNOSIS — N183 Chronic kidney disease, stage 3 (moderate): Secondary | ICD-10-CM | POA: Diagnosis not present

## 2016-06-07 DIAGNOSIS — J449 Chronic obstructive pulmonary disease, unspecified: Secondary | ICD-10-CM | POA: Diagnosis not present

## 2016-06-07 DIAGNOSIS — J9611 Chronic respiratory failure with hypoxia: Secondary | ICD-10-CM | POA: Diagnosis not present

## 2016-06-11 ENCOUNTER — Ambulatory Visit: Payer: Commercial Managed Care - HMO | Admitting: Family

## 2016-06-11 ENCOUNTER — Telehealth: Payer: Self-pay | Admitting: Pulmonary Disease

## 2016-06-11 DIAGNOSIS — I130 Hypertensive heart and chronic kidney disease with heart failure and stage 1 through stage 4 chronic kidney disease, or unspecified chronic kidney disease: Secondary | ICD-10-CM | POA: Diagnosis not present

## 2016-06-11 DIAGNOSIS — J9611 Chronic respiratory failure with hypoxia: Secondary | ICD-10-CM | POA: Diagnosis not present

## 2016-06-11 DIAGNOSIS — J449 Chronic obstructive pulmonary disease, unspecified: Secondary | ICD-10-CM | POA: Diagnosis not present

## 2016-06-11 DIAGNOSIS — E1122 Type 2 diabetes mellitus with diabetic chronic kidney disease: Secondary | ICD-10-CM | POA: Diagnosis not present

## 2016-06-11 DIAGNOSIS — N183 Chronic kidney disease, stage 3 (moderate): Secondary | ICD-10-CM | POA: Diagnosis not present

## 2016-06-11 DIAGNOSIS — I5042 Chronic combined systolic (congestive) and diastolic (congestive) heart failure: Secondary | ICD-10-CM | POA: Diagnosis not present

## 2016-06-11 NOTE — Telephone Encounter (Signed)
Serevent - yes, stop. I didn't recognize that he was on this or I would have stopped it when he saw me  Prednisone - no, for now. DK can re-evaluate this when he seems him in follow up.   Humalog 72/25 to Novolin switch - yes. Same dose in units of insulin  Waunita Schooner

## 2016-06-11 NOTE — Telephone Encounter (Signed)
Vivien Rota with [facility] calling looking to stop the Servent diskus would like to Add on prednisone  daily Would also like to let us know that patient is on Humalog 75-25  With the insulin would like to see if we can switch, for it is expensive and also it is not percise  when he does it twice a day Would like to know if they can do Novolin  Please advise

## 2016-06-11 NOTE — Telephone Encounter (Signed)
Please advise on message from Hospice. They say pt is on duonebs daily and want to know if we can stop Serevent and do Prednisone daily since pt is having so much trouble with his breathing. Nurse states that normally when they stop inhalers they do nebs and Prednisone daily. She is also wanting you to decide about switching his insulin. She states they are asking you about the insulin since you signed the pt for Hospice. Thanks.

## 2016-06-12 DIAGNOSIS — J449 Chronic obstructive pulmonary disease, unspecified: Secondary | ICD-10-CM | POA: Diagnosis not present

## 2016-06-12 DIAGNOSIS — I130 Hypertensive heart and chronic kidney disease with heart failure and stage 1 through stage 4 chronic kidney disease, or unspecified chronic kidney disease: Secondary | ICD-10-CM | POA: Diagnosis not present

## 2016-06-12 DIAGNOSIS — I5042 Chronic combined systolic (congestive) and diastolic (congestive) heart failure: Secondary | ICD-10-CM | POA: Diagnosis not present

## 2016-06-12 DIAGNOSIS — N183 Chronic kidney disease, stage 3 (moderate): Secondary | ICD-10-CM | POA: Diagnosis not present

## 2016-06-12 DIAGNOSIS — E1122 Type 2 diabetes mellitus with diabetic chronic kidney disease: Secondary | ICD-10-CM | POA: Diagnosis not present

## 2016-06-12 DIAGNOSIS — J9611 Chronic respiratory failure with hypoxia: Secondary | ICD-10-CM | POA: Diagnosis not present

## 2016-06-12 NOTE — Telephone Encounter (Signed)
Give 50 units SQ BID. Do not give if CBG < 150  Waunita Schooner

## 2016-06-12 NOTE — Telephone Encounter (Signed)
LMOM for Vivien Rota, Hospice Nurse to call back.

## 2016-06-12 NOTE — Telephone Encounter (Signed)
Vivien Rota from hospice states Humalog is ordered as 70 units BID before meals but states wife told her that she was informed to give the insulin if BS is 150 or greater. Nurse states pt is running average of 200. Please advise on directions for Novolin. Thanks.

## 2016-06-13 DIAGNOSIS — E1122 Type 2 diabetes mellitus with diabetic chronic kidney disease: Secondary | ICD-10-CM | POA: Diagnosis not present

## 2016-06-13 DIAGNOSIS — J449 Chronic obstructive pulmonary disease, unspecified: Secondary | ICD-10-CM | POA: Diagnosis not present

## 2016-06-13 DIAGNOSIS — I5042 Chronic combined systolic (congestive) and diastolic (congestive) heart failure: Secondary | ICD-10-CM | POA: Diagnosis not present

## 2016-06-13 DIAGNOSIS — J9611 Chronic respiratory failure with hypoxia: Secondary | ICD-10-CM | POA: Diagnosis not present

## 2016-06-13 DIAGNOSIS — I130 Hypertensive heart and chronic kidney disease with heart failure and stage 1 through stage 4 chronic kidney disease, or unspecified chronic kidney disease: Secondary | ICD-10-CM | POA: Diagnosis not present

## 2016-06-13 DIAGNOSIS — N183 Chronic kidney disease, stage 3 (moderate): Secondary | ICD-10-CM | POA: Diagnosis not present

## 2016-06-14 NOTE — Telephone Encounter (Signed)
Vivien Rota, nurse with Hospice informed. Nothing further needed.

## 2016-06-17 DIAGNOSIS — E1122 Type 2 diabetes mellitus with diabetic chronic kidney disease: Secondary | ICD-10-CM | POA: Diagnosis not present

## 2016-06-17 DIAGNOSIS — N183 Chronic kidney disease, stage 3 (moderate): Secondary | ICD-10-CM | POA: Diagnosis not present

## 2016-06-17 DIAGNOSIS — J9611 Chronic respiratory failure with hypoxia: Secondary | ICD-10-CM | POA: Diagnosis not present

## 2016-06-17 DIAGNOSIS — I130 Hypertensive heart and chronic kidney disease with heart failure and stage 1 through stage 4 chronic kidney disease, or unspecified chronic kidney disease: Secondary | ICD-10-CM | POA: Diagnosis not present

## 2016-06-17 DIAGNOSIS — I5042 Chronic combined systolic (congestive) and diastolic (congestive) heart failure: Secondary | ICD-10-CM | POA: Diagnosis not present

## 2016-06-17 DIAGNOSIS — J449 Chronic obstructive pulmonary disease, unspecified: Secondary | ICD-10-CM | POA: Diagnosis not present

## 2016-06-18 DIAGNOSIS — J449 Chronic obstructive pulmonary disease, unspecified: Secondary | ICD-10-CM | POA: Diagnosis not present

## 2016-06-18 DIAGNOSIS — I5042 Chronic combined systolic (congestive) and diastolic (congestive) heart failure: Secondary | ICD-10-CM | POA: Diagnosis not present

## 2016-06-18 DIAGNOSIS — N183 Chronic kidney disease, stage 3 (moderate): Secondary | ICD-10-CM | POA: Diagnosis not present

## 2016-06-18 DIAGNOSIS — I130 Hypertensive heart and chronic kidney disease with heart failure and stage 1 through stage 4 chronic kidney disease, or unspecified chronic kidney disease: Secondary | ICD-10-CM | POA: Diagnosis not present

## 2016-06-18 DIAGNOSIS — J9611 Chronic respiratory failure with hypoxia: Secondary | ICD-10-CM | POA: Diagnosis not present

## 2016-06-18 DIAGNOSIS — E1122 Type 2 diabetes mellitus with diabetic chronic kidney disease: Secondary | ICD-10-CM | POA: Diagnosis not present

## 2016-06-20 DIAGNOSIS — I130 Hypertensive heart and chronic kidney disease with heart failure and stage 1 through stage 4 chronic kidney disease, or unspecified chronic kidney disease: Secondary | ICD-10-CM | POA: Diagnosis not present

## 2016-06-20 DIAGNOSIS — J9611 Chronic respiratory failure with hypoxia: Secondary | ICD-10-CM | POA: Diagnosis not present

## 2016-06-20 DIAGNOSIS — N183 Chronic kidney disease, stage 3 (moderate): Secondary | ICD-10-CM | POA: Diagnosis not present

## 2016-06-20 DIAGNOSIS — E1122 Type 2 diabetes mellitus with diabetic chronic kidney disease: Secondary | ICD-10-CM | POA: Diagnosis not present

## 2016-06-20 DIAGNOSIS — I5042 Chronic combined systolic (congestive) and diastolic (congestive) heart failure: Secondary | ICD-10-CM | POA: Diagnosis not present

## 2016-06-20 DIAGNOSIS — J449 Chronic obstructive pulmonary disease, unspecified: Secondary | ICD-10-CM | POA: Diagnosis not present

## 2016-06-23 DIAGNOSIS — J8410 Pulmonary fibrosis, unspecified: Secondary | ICD-10-CM | POA: Diagnosis not present

## 2016-06-23 DIAGNOSIS — J449 Chronic obstructive pulmonary disease, unspecified: Secondary | ICD-10-CM | POA: Diagnosis not present

## 2016-06-23 DIAGNOSIS — J9611 Chronic respiratory failure with hypoxia: Secondary | ICD-10-CM | POA: Diagnosis not present

## 2016-06-23 DIAGNOSIS — I5042 Chronic combined systolic (congestive) and diastolic (congestive) heart failure: Secondary | ICD-10-CM | POA: Diagnosis not present

## 2016-06-23 DIAGNOSIS — Z7901 Long term (current) use of anticoagulants: Secondary | ICD-10-CM | POA: Diagnosis not present

## 2016-06-23 DIAGNOSIS — I272 Pulmonary hypertension, unspecified: Secondary | ICD-10-CM | POA: Diagnosis not present

## 2016-06-23 DIAGNOSIS — I130 Hypertensive heart and chronic kidney disease with heart failure and stage 1 through stage 4 chronic kidney disease, or unspecified chronic kidney disease: Secondary | ICD-10-CM | POA: Diagnosis not present

## 2016-06-23 DIAGNOSIS — I481 Persistent atrial fibrillation: Secondary | ICD-10-CM | POA: Diagnosis not present

## 2016-06-23 DIAGNOSIS — N183 Chronic kidney disease, stage 3 (moderate): Secondary | ICD-10-CM | POA: Diagnosis not present

## 2016-06-23 DIAGNOSIS — E1122 Type 2 diabetes mellitus with diabetic chronic kidney disease: Secondary | ICD-10-CM | POA: Diagnosis not present

## 2016-06-23 DIAGNOSIS — Z9981 Dependence on supplemental oxygen: Secondary | ICD-10-CM | POA: Diagnosis not present

## 2016-06-24 ENCOUNTER — Encounter: Payer: Self-pay | Admitting: Family

## 2016-06-24 ENCOUNTER — Ambulatory Visit: Payer: Medicare Other | Attending: Family | Admitting: Family

## 2016-06-24 VITALS — BP 136/52 | HR 74 | Resp 18 | Ht 67.0 in | Wt 201.0 lb

## 2016-06-24 DIAGNOSIS — Z8249 Family history of ischemic heart disease and other diseases of the circulatory system: Secondary | ICD-10-CM | POA: Insufficient documentation

## 2016-06-24 DIAGNOSIS — K219 Gastro-esophageal reflux disease without esophagitis: Secondary | ICD-10-CM | POA: Diagnosis not present

## 2016-06-24 DIAGNOSIS — N183 Chronic kidney disease, stage 3 unspecified: Secondary | ICD-10-CM

## 2016-06-24 DIAGNOSIS — I130 Hypertensive heart and chronic kidney disease with heart failure and stage 1 through stage 4 chronic kidney disease, or unspecified chronic kidney disease: Secondary | ICD-10-CM | POA: Diagnosis not present

## 2016-06-24 DIAGNOSIS — E1122 Type 2 diabetes mellitus with diabetic chronic kidney disease: Secondary | ICD-10-CM | POA: Diagnosis not present

## 2016-06-24 DIAGNOSIS — I5032 Chronic diastolic (congestive) heart failure: Secondary | ICD-10-CM

## 2016-06-24 DIAGNOSIS — G510 Bell's palsy: Secondary | ICD-10-CM | POA: Insufficient documentation

## 2016-06-24 DIAGNOSIS — Z888 Allergy status to other drugs, medicaments and biological substances status: Secondary | ICD-10-CM | POA: Diagnosis not present

## 2016-06-24 DIAGNOSIS — Z7902 Long term (current) use of antithrombotics/antiplatelets: Secondary | ICD-10-CM | POA: Insufficient documentation

## 2016-06-24 DIAGNOSIS — Z89029 Acquired absence of unspecified finger(s): Secondary | ICD-10-CM | POA: Diagnosis not present

## 2016-06-24 DIAGNOSIS — E785 Hyperlipidemia, unspecified: Secondary | ICD-10-CM | POA: Diagnosis not present

## 2016-06-24 DIAGNOSIS — D631 Anemia in chronic kidney disease: Secondary | ICD-10-CM | POA: Insufficient documentation

## 2016-06-24 DIAGNOSIS — Z794 Long term (current) use of insulin: Secondary | ICD-10-CM | POA: Diagnosis not present

## 2016-06-24 DIAGNOSIS — I481 Persistent atrial fibrillation: Secondary | ICD-10-CM | POA: Insufficient documentation

## 2016-06-24 DIAGNOSIS — Z7982 Long term (current) use of aspirin: Secondary | ICD-10-CM | POA: Diagnosis not present

## 2016-06-24 DIAGNOSIS — N184 Chronic kidney disease, stage 4 (severe): Secondary | ICD-10-CM | POA: Diagnosis not present

## 2016-06-24 DIAGNOSIS — Z9889 Other specified postprocedural states: Secondary | ICD-10-CM | POA: Insufficient documentation

## 2016-06-24 DIAGNOSIS — I5042 Chronic combined systolic (congestive) and diastolic (congestive) heart failure: Secondary | ICD-10-CM | POA: Insufficient documentation

## 2016-06-24 DIAGNOSIS — Z881 Allergy status to other antibiotic agents status: Secondary | ICD-10-CM | POA: Insufficient documentation

## 2016-06-24 DIAGNOSIS — I482 Chronic atrial fibrillation: Secondary | ICD-10-CM | POA: Insufficient documentation

## 2016-06-24 DIAGNOSIS — Z833 Family history of diabetes mellitus: Secondary | ICD-10-CM | POA: Insufficient documentation

## 2016-06-24 DIAGNOSIS — Z87891 Personal history of nicotine dependence: Secondary | ICD-10-CM | POA: Insufficient documentation

## 2016-06-24 DIAGNOSIS — I4819 Other persistent atrial fibrillation: Secondary | ICD-10-CM

## 2016-06-24 DIAGNOSIS — I10 Essential (primary) hypertension: Secondary | ICD-10-CM

## 2016-06-24 NOTE — Patient Instructions (Signed)
Continue weighing daily and call for an overnight weight gain of > 2 pounds or a weekly weight gain of >5 pounds.  Take torsemide 5 days a week and skip 2 days a week.

## 2016-06-24 NOTE — Progress Notes (Signed)
Patient ID: Patrick [name], male    DOB: 1931/04/26, 83 y.o.   MRN: [id number]  HPI  Patrick [name] is a 83 y/o male with a history of chronic kidney disease (stage III-IV), diabetes on insulin, HTN, persistent atrial fibrillation, hyperlipidemia, anemia, remote tobacco use and chronic diastolic heart failure.  Last echo done 01/22/16 which showed an EF of 60-65%without any aortic stenosis and trivial tricuspid regurgitation. Elevated PA pressure of 70 mm Hg. This is an improvement from his previous echocardiogram which showed an EF of 40-45%.  Recently admitted on 04/02/16 with acute bronchitis. Treated with IV steroids, antibiotics and nebulizers. Discharged home with prednisone. Previous admission was on 01/22/16 for acute on chronic heart failure. Patient was diuresed and cardiology consult obtained. Discharged in 2 days. Also admitted on 12/20/15 with exacerbation of heart failure. Was IV diuresed, was seen by cardiology and discharged home. Torsemide ordered for every other day.   Returns today for a f/u. Weighing himself daily and weight chart shows an overall stable weight. Does endorse being tired with little exertion along with experiencing some shortness of breath. Edema continues in bilateral lower legs. Currently taking his torsemide every other day. Denies any chest pain or palpitations. Continues to have difficulty sleeping at night but does nap during the day. Glucose today was 75. Currently has hospice home health coming to the home.  Past Medical History:  Diagnosis Date  . Amputated finger   . Anemia of chronic disease    a. plan of oncology to start Procrit - received during admission 12/2013  . Atypical chest pain    a. 12/2014 Neg CE - in setting of L pleural effusion.  . Bell's palsy   . Chronic combined systolic and diastolic CHF (congestive heart failure) ([place])    a. 11/2012 Echo: EF 60-65%, mod conc LVH, mildly dil LA/RA, mild Ao sclerosis w/o stenosis; b. 11/2014  Echo: EF 40-45%, prob mid-apicalanteroseptal, ant, apical HK, Gr2 DD, mod dil LA, mildly dil RA (technically difficult study); c. echo 11/2015: EF 60-65%, trivial AI, nl RV sys fxan, PASP 59; d. echo 10/17: EF 60-65%, no RWMA, mildly dilated LA, mildly reduced RV sys fxn, PASP 70  . CKD (chronic kidney disease), stage III    a. stage III-IV  . Diabetes mellitus without complication ([place])   . GERD (gastroesophageal reflux disease)   . History of gastroesophageal reflux (GERD)   . Hyperlipidemia   . Hypertension   . Permanent atrial fibrillation ([place])    a. Dx 12/2011, Rate-controlled, chronic Xarelto (renal dosing) - CHA2DS2VASc = 5.  . Pleural effusion, left    a. 12/2014 s/p thoracentesis - protein <3, LDH 123, no malignancy.   Past Surgical History:  Procedure Laterality Date  . APPENDECTOMY    . COLONOSCOPY  09/2011  . ESOPHAGOGASTRODUODENOSCOPY    . FINGER SURGERY     right hand   Family History  Problem Relation Age of Onset  . Heart attack Brother   . Diabetes Mother   . Diabetes Sister   . Hypertension     Social History  Substance Use Topics  . Smoking status: Former Smoker    Packs/day: 0.25    Years: 10.00    Types: Cigars    Quit date: 05/06/1966  . Smokeless tobacco: Never Used  . Alcohol use No   Allergies  Allergen Reactions  . Levofloxacin Other (See Comments)    confusion  . Prednisone Other (See Comments)    confusion  Prior to Admission medications   Medication Sig Start Date End Date Taking? Authorizing Provider  aspirin 81 MG tablet Take 81 mg by mouth daily.   Yes Historical Provider, MD  budesonide (PULMICORT) 0.25 MG/2ML nebulizer solution Take 2 mLs (0.25 mg total) by nebulization 2 (two) times daily. Dx code J44.9 03/06/16 03/06/17 Yes Flora Lipps, MD  cloNIDine (CATAPRES) 0.1 MG tablet Take 1 tablet (0.1 mg total) by mouth 2 (two) times daily. 04/15/16  Yes Alisa Graff, FNP  formoterol (PERFOROMIST) 20 MCG/2ML nebulizer solution Take 2 mLs  (20 mcg total) by nebulization 2 (two) times daily. Dx code j44.9 03/06/16  Yes Flora Lipps, MD  insulin lispro protamine-lispro (HUMALOG MIX 75/25) (75-25) 100 UNIT/ML SUSP injection Inject 50 Units into the skin 2 (two) times daily. Don't take if under 150 01/24/16  Yes Fritzi Mandes, MD  ipratropium-albuterol (DUONEB) 0.5-2.5 (3) MG/3ML SOLN Take 3 mLs by nebulization every 6 (six) hours as needed (Shortness of breath). 04/04/16  Yes Srikar Sudini, MD  metoprolol tartrate (LOPRESSOR) 25 MG tablet Take 1 tablet (25 mg total) by mouth 2 (two) times daily. 04/04/16  Yes Srikar Sudini, MD  omeprazole (PRILOSEC) 40 MG capsule Take 1 capsule (40 mg total) by mouth daily. 04/15/16  Yes Alisa Graff, FNP  pravastatin (PRAVACHOL) 40 MG tablet Take 40 mg by mouth daily.   Yes Historical Provider, MD  Rivaroxaban (XARELTO) 15 MG TABS tablet Take 1 tablet (15 mg total) by mouth daily. 03/14/16  Yes Minna Merritts, MD  torsemide (DEMADEX) 20 MG tablet Take 1 tablet (20 mg total) by mouth every other day. 12/22/15  Yes Henreitta Leber, MD    Review of Systems  Constitutional: Positive for fatigue. Negative for appetite change.  HENT: Negative for congestion, postnasal drip and sore throat.   Eyes: Negative.   Respiratory: Negative for chest tightness.   Cardiovascular: Negative for chest pain and palpitations.  Gastrointestinal: Negative for abdominal distention and abdominal pain.  Endocrine: Negative.   Genitourinary: Negative.   Musculoskeletal: Negative for back pain and neck pain.  Skin: Negative.   Allergic/Immunologic: Negative.   Neurological: Negative for dizziness and light-headedness.  Hematological: Negative for adenopathy. Does not bruise/bleed easily.  Psychiatric/Behavioral: Negative for dysphoric mood and sleep disturbance. The patient is not nervous/anxious.    Vitals:   06/24/16 1132  BP: (!) 136/52  Pulse: 74  Resp: 18  SpO2: 100%  Weight: 201 lb (91.2 kg)  Height: 5\' 7"  (1.702  m)   Wt Readings from Last 3 Encounters:  06/24/16 201 lb (91.2 kg)  05/17/16 182 lb (82.6 kg)  05/09/16 199 lb 11.8 oz (90.6 kg)   Lab Results  Component Value Date   CREATININE 2.03 (H) 04/03/2016   CREATININE 1.91 (H) 04/02/2016   CREATININE 2.53 (H) 02/05/2016   Physical Exam  Constitutional: He is oriented to person, place, and time. He appears well-developed and well-nourished.  HENT:  Head: Normocephalic and atraumatic.  Eyes: Conjunctivae are normal. Pupils are equal, round, and reactive to light.  Neck: Normal range of motion. Neck supple. No JVD present.  Cardiovascular: Normal rate and regular rhythm.   Pulmonary/Chest: Effort normal. He has no wheezes. He has no rales.  Abdominal: Soft. He exhibits no distension. There is no tenderness.  Musculoskeletal: He exhibits edema (3+ pitting edema in bilateral lower legs). He exhibits no tenderness.  Neurological: He is alert and oriented to person, place, and time.  Skin: Skin is warm and dry.  Psychiatric: He has a normal mood and affect. His behavior is normal. Thought content normal.  Nursing note and vitals reviewed.     Assessment & Plan:  1: Chronic heart failure with preserved ejection fraction-  - NYHA class III - moderately fluid overloaded today with increased edema in lower legs. Weight up 10 pounds since last here January 2018 - Continue daily weights and call for an overnight weight gain of >2 pounds or weekly weight gain of >5 pounds. Does take torsemide every other day. Home weight chart reviewed and home weights show a very gradual increase - advised to take the torsemide 5 days / week and skip 2 days / week. Cautious about using metolazone due to underlying renal insufficiency - elevate legs when sitting at home as much as possible. Patient admits that he doesn't elevate them much and in the morning after he's been in bed all night, the swelling is better - not adding salt to his food - last saw cardiologist  [name]) on 03/04/16 - has home health nurse  - sees pulmonologist Alva Garnet) 07/19/16  2: HTN- -Blood pressure looks good -Home blood pressure readings reviewed and are elevated at times in the mornings. Limited with medications due to renal function - Wife is to continue checking blood pressure.  3: Persistent atrial fibrillation- - rate controlled at this time on metoprolol - no bleeding noted with xarelto  4: CKD, stage III-IV- -Follows closely with nephrology  -Remains uninterested in dialysis - glucose today was 25 - sees nephrologist 07/15/16  Medication bottles were reviewed.  Return here in 3 months or sooner for any questions/problems before then.

## 2016-06-25 ENCOUNTER — Telehealth: Payer: Self-pay | Admitting: Pulmonary Disease

## 2016-06-25 DIAGNOSIS — I130 Hypertensive heart and chronic kidney disease with heart failure and stage 1 through stage 4 chronic kidney disease, or unspecified chronic kidney disease: Secondary | ICD-10-CM | POA: Diagnosis not present

## 2016-06-25 DIAGNOSIS — I5042 Chronic combined systolic (congestive) and diastolic (congestive) heart failure: Secondary | ICD-10-CM | POA: Diagnosis not present

## 2016-06-25 DIAGNOSIS — E1122 Type 2 diabetes mellitus with diabetic chronic kidney disease: Secondary | ICD-10-CM | POA: Diagnosis not present

## 2016-06-25 DIAGNOSIS — N183 Chronic kidney disease, stage 3 (moderate): Secondary | ICD-10-CM | POA: Diagnosis not present

## 2016-06-25 DIAGNOSIS — J9611 Chronic respiratory failure with hypoxia: Secondary | ICD-10-CM | POA: Diagnosis not present

## 2016-06-25 DIAGNOSIS — J449 Chronic obstructive pulmonary disease, unspecified: Secondary | ICD-10-CM | POA: Diagnosis not present

## 2016-06-25 NOTE — Telephone Encounter (Signed)
Hospice nurse calling to check status of plan of treatment faxed over on 06/24/2016, also faxed last week. States they need it by the end of this week.

## 2016-06-25 NOTE — Telephone Encounter (Signed)
Papers in DS' folder to be signed. Will fax once signed.

## 2016-06-26 DIAGNOSIS — N183 Chronic kidney disease, stage 3 (moderate): Secondary | ICD-10-CM | POA: Diagnosis not present

## 2016-06-26 DIAGNOSIS — J9611 Chronic respiratory failure with hypoxia: Secondary | ICD-10-CM | POA: Diagnosis not present

## 2016-06-26 DIAGNOSIS — I5042 Chronic combined systolic (congestive) and diastolic (congestive) heart failure: Secondary | ICD-10-CM | POA: Diagnosis not present

## 2016-06-26 DIAGNOSIS — I130 Hypertensive heart and chronic kidney disease with heart failure and stage 1 through stage 4 chronic kidney disease, or unspecified chronic kidney disease: Secondary | ICD-10-CM | POA: Diagnosis not present

## 2016-06-26 DIAGNOSIS — J449 Chronic obstructive pulmonary disease, unspecified: Secondary | ICD-10-CM | POA: Diagnosis not present

## 2016-06-26 DIAGNOSIS — E1122 Type 2 diabetes mellitus with diabetic chronic kidney disease: Secondary | ICD-10-CM | POA: Diagnosis not present

## 2016-06-27 DIAGNOSIS — J449 Chronic obstructive pulmonary disease, unspecified: Secondary | ICD-10-CM | POA: Diagnosis not present

## 2016-06-27 DIAGNOSIS — I130 Hypertensive heart and chronic kidney disease with heart failure and stage 1 through stage 4 chronic kidney disease, or unspecified chronic kidney disease: Secondary | ICD-10-CM | POA: Diagnosis not present

## 2016-06-27 DIAGNOSIS — E1122 Type 2 diabetes mellitus with diabetic chronic kidney disease: Secondary | ICD-10-CM | POA: Diagnosis not present

## 2016-06-27 DIAGNOSIS — N183 Chronic kidney disease, stage 3 (moderate): Secondary | ICD-10-CM | POA: Diagnosis not present

## 2016-06-27 DIAGNOSIS — J9611 Chronic respiratory failure with hypoxia: Secondary | ICD-10-CM | POA: Diagnosis not present

## 2016-06-27 DIAGNOSIS — I5042 Chronic combined systolic (congestive) and diastolic (congestive) heart failure: Secondary | ICD-10-CM | POA: Diagnosis not present

## 2016-06-27 NOTE — Telephone Encounter (Signed)
Papers signed by DS and faxed back to hospice. Nothing further needed.

## 2016-06-30 ENCOUNTER — Encounter: Payer: Self-pay | Admitting: Emergency Medicine

## 2016-06-30 ENCOUNTER — Emergency Department

## 2016-06-30 ENCOUNTER — Inpatient Hospital Stay
Admission: EM | Admit: 2016-06-30 | Discharge: 2016-07-02 | DRG: 871 | Disposition: A | Discharge status: Hospice | Attending: Internal Medicine | Admitting: Internal Medicine

## 2016-06-30 DIAGNOSIS — E785 Hyperlipidemia, unspecified: Secondary | ICD-10-CM | POA: Diagnosis present

## 2016-06-30 DIAGNOSIS — Z79899 Other long term (current) drug therapy: Secondary | ICD-10-CM

## 2016-06-30 DIAGNOSIS — I272 Pulmonary hypertension, unspecified: Secondary | ICD-10-CM | POA: Diagnosis present

## 2016-06-30 DIAGNOSIS — R0603 Acute respiratory distress: Secondary | ICD-10-CM | POA: Diagnosis not present

## 2016-06-30 DIAGNOSIS — N183 Chronic kidney disease, stage 3 unspecified: Secondary | ICD-10-CM | POA: Diagnosis present

## 2016-06-30 DIAGNOSIS — J9690 Respiratory failure, unspecified, unspecified whether with hypoxia or hypercapnia: Secondary | ICD-10-CM | POA: Diagnosis not present

## 2016-06-30 DIAGNOSIS — R0602 Shortness of breath: Secondary | ICD-10-CM | POA: Diagnosis not present

## 2016-06-30 DIAGNOSIS — Y95 Nosocomial condition: Secondary | ICD-10-CM | POA: Diagnosis present

## 2016-06-30 DIAGNOSIS — J189 Pneumonia, unspecified organism: Secondary | ICD-10-CM | POA: Diagnosis not present

## 2016-06-30 DIAGNOSIS — I158 Other secondary hypertension: Secondary | ICD-10-CM | POA: Diagnosis present

## 2016-06-30 DIAGNOSIS — Z87891 Personal history of nicotine dependence: Secondary | ICD-10-CM

## 2016-06-30 DIAGNOSIS — R0682 Tachypnea, not elsewhere classified: Secondary | ICD-10-CM | POA: Diagnosis not present

## 2016-06-30 DIAGNOSIS — E1122 Type 2 diabetes mellitus with diabetic chronic kidney disease: Secondary | ICD-10-CM | POA: Diagnosis present

## 2016-06-30 DIAGNOSIS — I5043 Acute on chronic combined systolic (congestive) and diastolic (congestive) heart failure: Secondary | ICD-10-CM | POA: Diagnosis not present

## 2016-06-30 DIAGNOSIS — E119 Type 2 diabetes mellitus without complications: Secondary | ICD-10-CM

## 2016-06-30 DIAGNOSIS — N179 Acute kidney failure, unspecified: Secondary | ICD-10-CM | POA: Insufficient documentation

## 2016-06-30 DIAGNOSIS — R0902 Hypoxemia: Secondary | ICD-10-CM

## 2016-06-30 DIAGNOSIS — Z7901 Long term (current) use of anticoagulants: Secondary | ICD-10-CM | POA: Diagnosis not present

## 2016-06-30 DIAGNOSIS — E11649 Type 2 diabetes mellitus with hypoglycemia without coma: Secondary | ICD-10-CM | POA: Diagnosis present

## 2016-06-30 DIAGNOSIS — D649 Anemia, unspecified: Secondary | ICD-10-CM | POA: Diagnosis not present

## 2016-06-30 DIAGNOSIS — Z7982 Long term (current) use of aspirin: Secondary | ICD-10-CM

## 2016-06-30 DIAGNOSIS — J9600 Acute respiratory failure, unspecified whether with hypoxia or hypercapnia: Secondary | ICD-10-CM

## 2016-06-30 DIAGNOSIS — Z7951 Long term (current) use of inhaled steroids: Secondary | ICD-10-CM | POA: Diagnosis not present

## 2016-06-30 DIAGNOSIS — I481 Persistent atrial fibrillation: Secondary | ICD-10-CM | POA: Diagnosis not present

## 2016-06-30 DIAGNOSIS — J9601 Acute respiratory failure with hypoxia: Secondary | ICD-10-CM | POA: Diagnosis present

## 2016-06-30 DIAGNOSIS — D631 Anemia in chronic kidney disease: Secondary | ICD-10-CM | POA: Diagnosis present

## 2016-06-30 DIAGNOSIS — J449 Chronic obstructive pulmonary disease, unspecified: Secondary | ICD-10-CM | POA: Diagnosis not present

## 2016-06-30 DIAGNOSIS — A419 Sepsis, unspecified organism: Principal | ICD-10-CM | POA: Diagnosis present

## 2016-06-30 DIAGNOSIS — J9611 Chronic respiratory failure with hypoxia: Secondary | ICD-10-CM | POA: Diagnosis not present

## 2016-06-30 DIAGNOSIS — N189 Chronic kidney disease, unspecified: Secondary | ICD-10-CM

## 2016-06-30 DIAGNOSIS — E876 Hypokalemia: Secondary | ICD-10-CM | POA: Diagnosis present

## 2016-06-30 DIAGNOSIS — R042 Hemoptysis: Secondary | ICD-10-CM | POA: Diagnosis present

## 2016-06-30 DIAGNOSIS — I5042 Chronic combined systolic (congestive) and diastolic (congestive) heart failure: Secondary | ICD-10-CM | POA: Diagnosis not present

## 2016-06-30 DIAGNOSIS — I10 Essential (primary) hypertension: Secondary | ICD-10-CM | POA: Diagnosis not present

## 2016-06-30 DIAGNOSIS — E162 Hypoglycemia, unspecified: Secondary | ICD-10-CM

## 2016-06-30 DIAGNOSIS — E1121 Type 2 diabetes mellitus with diabetic nephropathy: Secondary | ICD-10-CM | POA: Diagnosis present

## 2016-06-30 DIAGNOSIS — E872 Acidosis: Secondary | ICD-10-CM | POA: Diagnosis present

## 2016-06-30 DIAGNOSIS — I130 Hypertensive heart and chronic kidney disease with heart failure and stage 1 through stage 4 chronic kidney disease, or unspecified chronic kidney disease: Secondary | ICD-10-CM | POA: Diagnosis not present

## 2016-06-30 DIAGNOSIS — I4819 Other persistent atrial fibrillation: Secondary | ICD-10-CM | POA: Diagnosis present

## 2016-06-30 DIAGNOSIS — J440 Chronic obstructive pulmonary disease with acute lower respiratory infection: Secondary | ICD-10-CM | POA: Diagnosis present

## 2016-06-30 DIAGNOSIS — I169 Hypertensive crisis, unspecified: Secondary | ICD-10-CM | POA: Diagnosis present

## 2016-06-30 DIAGNOSIS — Z9981 Dependence on supplemental oxygen: Secondary | ICD-10-CM

## 2016-06-30 DIAGNOSIS — R0600 Dyspnea, unspecified: Secondary | ICD-10-CM | POA: Diagnosis present

## 2016-06-30 DIAGNOSIS — Z7952 Long term (current) use of systemic steroids: Secondary | ICD-10-CM

## 2016-06-30 DIAGNOSIS — J984 Other disorders of lung: Secondary | ICD-10-CM | POA: Diagnosis not present

## 2016-06-30 LAB — CBC
HEMATOCRIT: 27.5 % — AB (ref 40.0–52.0)
Hemoglobin: 8.2 g/dL — ABNORMAL LOW (ref 13.0–18.0)
MCH: 22.3 pg — ABNORMAL LOW (ref 26.0–34.0)
MCHC: 29.8 g/dL — AB (ref 32.0–36.0)
MCV: 74.9 fL — ABNORMAL LOW (ref 80.0–100.0)
PLATELETS: 347 10*3/uL (ref 150–440)
RBC: 3.67 MIL/uL — ABNORMAL LOW (ref 4.40–5.90)
RDW: 21.4 % — AB (ref 11.5–14.5)
WBC: 16.1 10*3/uL — ABNORMAL HIGH (ref 3.8–10.6)

## 2016-06-30 LAB — GLUCOSE, CAPILLARY
Glucose-Capillary: 126 mg/dL — ABNORMAL HIGH (ref 65–99)
Glucose-Capillary: 38 mg/dL — CL (ref 65–99)

## 2016-06-30 LAB — BASIC METABOLIC PANEL
Anion gap: 10 (ref 5–15)
BUN: 26 mg/dL — ABNORMAL HIGH (ref 6–20)
CALCIUM: 9 mg/dL (ref 8.9–10.3)
CO2: 35 mmol/L — AB (ref 22–32)
CREATININE: 2 mg/dL — AB (ref 0.61–1.24)
Chloride: 95 mmol/L — ABNORMAL LOW (ref 101–111)
GFR calc non Af Amer: 29 mL/min — ABNORMAL LOW (ref >60)
GFR, EST AFRICAN AMERICAN: 34 mL/min — AB (ref >60)
GLUCOSE: 50 mg/dL — AB (ref 65–99)
Potassium: 2.7 mmol/L — CL (ref 3.5–5.1)
Sodium: 140 mmol/L (ref 135–145)

## 2016-06-30 LAB — PROTIME-INR
INR: 1.83
PROTHROMBIN TIME: 21.4 s — AB (ref 11.4–15.2)

## 2016-06-30 LAB — LACTIC ACID, PLASMA: Lactic Acid, Venous: 3 mmol/L (ref 0.5–1.9)

## 2016-06-30 LAB — BRAIN NATRIURETIC PEPTIDE: B NATRIURETIC PEPTIDE 5: 780 pg/mL — AB (ref 0.0–100.0)

## 2016-06-30 LAB — TROPONIN I: Troponin I: 0.03 ng/mL (ref <0.03)

## 2016-06-30 LAB — APTT: aPTT: 44 seconds — ABNORMAL HIGH (ref 24–36)

## 2016-06-30 MED ORDER — VANCOMYCIN HCL IN DEXTROSE 1-5 GM/200ML-% IV SOLN
1000.0000 mg | Freq: Once | INTRAVENOUS | Status: AC
Start: 2016-06-30 — End: 2016-06-30
  Administered 2016-06-30: 1000 mg via INTRAVENOUS
  Filled 2016-06-30: qty 200

## 2016-06-30 MED ORDER — DEXTROSE 5 % IV SOLN
2.0000 g | Freq: Once | INTRAVENOUS | Status: AC
  Administered 2016-07-01: 2 g via INTRAVENOUS
  Filled 2016-06-30: qty 2

## 2016-06-30 MED ORDER — DEXTROSE 50 % IV SOLN
1.0000 | Freq: Once | INTRAVENOUS | Status: AC
  Administered 2016-06-30: 50 mL via INTRAVENOUS
  Filled 2016-06-30: qty 50

## 2016-06-30 MED ORDER — VANCOMYCIN HCL 10 G IV SOLR
1250.0000 mg | INTRAVENOUS | Status: DC
  Administered 2016-07-01: 1250 mg via INTRAVENOUS
  Filled 2016-06-30: qty 1250

## 2016-06-30 MED ORDER — DEXTROSE 5 % IV SOLN
2.0000 g | INTRAVENOUS | Status: DC
  Administered 2016-07-01: 2 g via INTRAVENOUS
  Filled 2016-06-30 (×3): qty 2

## 2016-06-30 MED ORDER — POTASSIUM CHLORIDE 2 MEQ/ML IV SOLN
30.0000 meq | Freq: Once | INTRAVENOUS | Status: AC
  Administered 2016-07-01: 30 meq via INTRAVENOUS
  Filled 2016-06-30: qty 15

## 2016-06-30 NOTE — ED Provider Notes (Addendum)
[facility] Emergency Department Provider Note  ____________________________________________   First MD Initiated Contact with Patient 06/30/16 2119     (approximate)  I have reviewed the triage vital signs and the nursing notes.   HISTORY  Chief Complaint Shortness of Breath and Hemoptysis  EM caveat: Limited by dyspnea and some confusion  HPI Patrick [name] is a 83 y.o. male previous history of congestive heart failure, chronic kidney disease, diabetes and atrial fibrillation  Patient began having increasing shortness of breath, cough that is productive with small amounts of blood in it today. He was noted be quite dyspneic with a saturation of 67% on 4 L nasal cannula and EMS reports he is "full code".  The patient reports he feels short of breath, has been coughing. He has difficulty giving a history because of dyspnea. He was given albuterol by paramedics  He specifically denies any chest pain. No abdominal pain. He reports he feels like he's been coughing with productive sputum. Small amounts of blood in it.   Past Medical History:  Diagnosis Date  . Amputated finger   . Anemia of chronic disease    a. plan of oncology to start Procrit - received during admission 12/2013  . Atypical chest pain    a. 12/2014 Neg CE - in setting of L pleural effusion.  . Bell's palsy   . Chronic combined systolic and diastolic CHF (congestive heart failure) ([place])    a. 11/2012 Echo: EF 60-65%, mod conc LVH, mildly dil LA/RA, mild Ao sclerosis w/o stenosis; b. 11/2014 Echo: EF 40-45%, prob mid-apicalanteroseptal, ant, apical HK, Gr2 DD, mod dil LA, mildly dil RA (technically difficult study); c. echo 11/2015: EF 60-65%, trivial AI, nl RV sys fxan, PASP 59; d. echo 10/17: EF 60-65%, no RWMA, mildly dilated LA, mildly reduced RV sys fxn, PASP 70  . CKD (chronic kidney disease), stage III    a. stage III-IV  . Diabetes mellitus without complication ([place])   .  GERD (gastroesophageal reflux disease)   . History of gastroesophageal reflux (GERD)   . Hyperlipidemia   . Hypertension   . Permanent atrial fibrillation ([place])    a. Dx 12/2011, Rate-controlled, chronic Xarelto (renal dosing) - CHA2DS2VASc = 5.  . Pleural effusion, left    a. 12/2014 s/p thoracentesis - protein <3, LDH 123, no malignancy.    Patient Active Problem List   Diagnosis Date Noted  . Iron deficiency anemia 05/11/2016  . Anemia, chronic renal failure, stage 3 (moderate) 05/11/2016  . Lingular mass 02/06/2016  . Hypertensive heart disease 12/21/2015  . Chronic diastolic heart failure ([place]) 12/18/2015  . CAP (community acquired pneumonia) 11/20/2015  . CKD (chronic kidney disease), stage III   . Orthostatic hypotension 08/08/2014  . Anemia of chronic disease   . Chronic renal disease 11/26/2012  . Chronic combined systolic and diastolic CHF (congestive heart failure) ([place]) 11/26/2012  . Persistent atrial fibrillation ([place]) 01/15/2012  . Hypertension 01/15/2012  . Hyperlipidemia 01/15/2012  . Diabetes mellitus ([place]) 01/15/2012    Past Surgical History:  Procedure Laterality Date  . APPENDECTOMY    . COLONOSCOPY  09/2011  . ESOPHAGOGASTRODUODENOSCOPY    . FINGER SURGERY     right hand    Prior to Admission medications   Medication Sig Start Date End Date Taking? Authorizing Provider  aspirin 81 MG tablet Take 81 mg by mouth daily.    Historical Provider, MD  budesonide (PULMICORT) 0.25 MG/2ML nebulizer solution Take 2 mLs (0.25  mg total) by nebulization 2 (two) times daily. Dx code J44.9 03/06/16 03/06/17  Flora Lipps, MD  cloNIDine (CATAPRES) 0.1 MG tablet Take 1 tablet (0.1 mg total) by mouth 2 (two) times daily. 04/15/16   Alisa Graff, FNP  formoterol (PERFOROMIST) 20 MCG/2ML nebulizer solution Take 2 mLs (20 mcg total) by nebulization 2 (two) times daily. Dx code j44.9 03/06/16   Flora Lipps, MD  insulin lispro protamine-lispro (HUMALOG MIX 75/25) (75-25) 100  UNIT/ML SUSP injection Inject 50 Units into the skin 2 (two) times daily. Don't take if under 150 01/24/16   Fritzi Mandes, MD  ipratropium-albuterol (DUONEB) 0.5-2.5 (3) MG/3ML SOLN Take 3 mLs by nebulization every 6 (six) hours as needed (Shortness of breath). 04/04/16   Hillary Bow, MD  metoprolol tartrate (LOPRESSOR) 25 MG tablet Take 1 tablet (25 mg total) by mouth 2 (two) times daily. 04/04/16   Srikar Sudini, MD  omeprazole (PRILOSEC) 40 MG capsule Take 1 capsule (40 mg total) by mouth daily. 04/15/16   Alisa Graff, FNP  pravastatin (PRAVACHOL) 40 MG tablet Take 40 mg by mouth daily.    Historical Provider, MD  Rivaroxaban (XARELTO) 15 MG TABS tablet Take 1 tablet (15 mg total) by mouth daily. 03/14/16   Minna Merritts, MD  torsemide (DEMADEX) 20 MG tablet Take 1 tablet (20 mg total) by mouth every other day. 12/22/15   Henreitta Leber, MD    Allergies Levofloxacin and Prednisone  Family History  Problem Relation Age of Onset  . Heart attack Brother   . Diabetes Mother   . Diabetes Sister   . Hypertension      Social History Social History  Substance Use Topics  . Smoking status: Former Smoker    Packs/day: 0.25    Years: 10.00    Types: Cigars    Quit date: 05/06/1966  . Smokeless tobacco: Never Used  . Alcohol use No    Review of Systems EM caveat  ____________________________________________   PHYSICAL EXAM:  VITAL SIGNS: ED Triage Vitals  Enc Vitals Group     BP --      Pulse Rate 06/30/16 2114 96     Resp 06/30/16 2114 (!) 25     Temp 06/30/16 2114 98.5 F (36.9 C)     Temp Source 06/30/16 2114 Oral     SpO2 06/30/16 2106 92 %     Weight 06/30/16 2115 192 lb (87.1 kg)     Height 06/30/16 2115 5\' 7"  (1.702 m)     Head Circumference --      Peak Flow --      Pain Score --      Pain Loc --      Pain Edu? --      Excl. in [place]? --    Constitutional: Alert and Slightly dioriented. Sitting upright, appears dyspneic. Moderate increased work of breathing  with poor inspiratory effort Eyes: Conjunctivae are normal. PERRL. EOMI. Head: Atraumatic. Nose: No congestion/rhinnorhea. Mouth/Throat: Mucous membranes are dry.  Oropharynx non-erythematous. Neck: No stridor.   Cardiovascular: Normal rate, regular rhythm. Grossly normal heart sounds.  Good peripheral circulation. Respiratory: Moderate increased work of breathing, diminished lung sounds throughout with no focal rales noted or wheezing noted. He is using accessory muscles and appears moderately dyspneic.  Gastrointestinal: Soft and nontender. No distention. Somewhat obese  Musculoskeletal: No lower extremity tenderness with 3+ pitting edema bilat.  No joint effusions. Neurologic:  Normal speech and language. Oriented to name and place but not  to date. Skin:  Skin is warm, dry and intact. Psychiatric: Mood and affect are normal.   ____________________________________________   LABS (all labs ordered are listed, but only abnormal results are displayed)  Labs Reviewed  BRAIN NATRIURETIC PEPTIDE - Abnormal; Notable for the following:       Result Value   B Natriuretic Peptide 780.0 (*)    All other components within normal limits  CBC - Abnormal; Notable for the following:    WBC 16.1 (*)    RBC 3.67 (*)    Hemoglobin 8.2 (*)    HCT 27.5 (*)    MCV 74.9 (*)    MCH 22.3 (*)    MCHC 29.8 (*)    RDW 21.4 (*)    All other components within normal limits  BASIC METABOLIC PANEL - Abnormal; Notable for the following:    Potassium 2.7 (*)    Chloride 95 (*)    CO2 35 (*)    Glucose, Bld 50 (*)    BUN 26 (*)    Creatinine, Ser 2.00 (*)    GFR calc non Af Amer 29 (*)    GFR calc Af Amer 34 (*)    All other components within normal limits  TROPONIN I - Abnormal; Notable for the following:    Troponin I 0.03 (*)    All other components within normal limits  PROTIME-INR - Abnormal; Notable for the following:    Prothrombin Time 21.4 (*)    All other components within normal limits    APTT - Abnormal; Notable for the following:    aPTT 44 (*)    All other components within normal limits  LACTIC ACID, PLASMA - Abnormal; Notable for the following:    Lactic Acid, Venous 3.0 (*)    All other components within normal limits  BLOOD GAS, VENOUS - Abnormal; Notable for the following:    pH, Ven 7.45 (*)    Bicarbonate 41.0 (*)    Acid-Base Excess 15.3 (*)    All other components within normal limits  GLUCOSE, CAPILLARY - Abnormal; Notable for the following:    Glucose-Capillary 38 (*)    All other components within normal limits  GLUCOSE, CAPILLARY - Abnormal; Notable for the following:    Glucose-Capillary 126 (*)    All other components within normal limits  CULTURE, BLOOD (ROUTINE X 2)  CULTURE, BLOOD (ROUTINE X 2)  LACTIC ACID, PLASMA  CBG MONITORING, ED  CBG MONITORING, ED   ____________________________________________  EKG  Reviewed and interpreted by me at 2115 Heart rate 100 QRS 1:30 QTc 4:30 Atrial fibrillation, a slight wander, nonspecific T-wave abnormality noted, somewhat into hard to interpret due to variation likely due to respiratory. A. fib, no obvious evidence of acute ischemia is noted no ST elevation MI detected ____________________________________________  RADIOLOGY  Dg Chest Portable 1 View  Result Date: 06/30/2016 CLINICAL DATA:  Shortness of breath. Oxygen dependent. Mild hemoptysis. EXAM: PORTABLE CHEST 1 VIEW COMPARISON:  04/02/2016 and 01/22/2016 FINDINGS: Lungs are adequately inflated with stable bibasilar opacification and worsening airspace opacification over the right midlung suggesting acute on chronic process and may be due to infection. Mild blunting of the costophrenic angles unchanged. Cardiomegaly unchanged. Calcified plaque over the thoracic aorta. Degenerative change of the spine. IMPRESSION: Worsening airspace opacification over the right midlung which may represent acute infection. Chronic bibasilar opacification and chronic  blunting of the costophrenic angles. Stable cardiomegaly.  Aortic atherosclerosis. Electronically Signed   By: Marin Olp M.D.  On: 06/30/2016 22:09    ____________________________________________   PROCEDURES  Procedure(s) performed: None  Procedures  Critical Care performed: Yes, see critical care note(s)  CRITICAL CARE Performed by: Delman Kitten   Total critical care time: 45 minutes  Critical care time was exclusive of separately billable procedures and treating other patients.  Critical care was necessary to treat or prevent imminent or life-threatening deterioration.  Critical care was time spent personally by me on the following activities: development of treatment plan with patient and/or surrogate as well as nursing, discussions with consultants, evaluation of patient's response to treatment, examination of patient, obtaining history from patient or surrogate, ordering and performing treatments and interventions, ordering and review of laboratory studies, ordering and review of radiographic studies, pulse oximetry and re-evaluation of patient's condition.  Patient placed on BiPAP, improvement in respirations. At this time, somewhat undifferentiated as the exact causes shortness of breath however he does have leukocytosis, is increased productive cough with small amount of hemoptysis. Certainly differential still broad, he does have edema in both legs as well and is unclear if this may represent acute pneumonia, congestive heart failure exacerbation, felt less likely to represent pulmonary embolism versus ACS. Patient is improving on BiPAP, I will cover with antibiotics, blood cultures for concerns of possible infectious etiologies and continue to work him up. ____________________________________________   INITIAL IMPRESSION / [place] / ED COURSE  Pertinent labs & imaging results that were available during my care of the patient were reviewed by me and  considered in my medical decision making (see chart for details).      ----------------------------------------- 10:14 PM on 06/30/2016 -----------------------------------------  Chest x-ray demonstrates concern for possible increasing infiltrate. Given leukocytosis, worsening shortness of breath and hypoxia the primary concern at this point appears to be likely healthcare associated pneumonia. He started patient on broad-spectrum antibiotics, continue to monitor him on BiPAP for which she is improving with better oxygenation now. In addition end-tidal capnography on nasal cannula on arrival demonstrated end-tidal approximate 24 with Square waveform and no evidence of retaining, again pointing towards a probable diagnosis of pneumonia possibly underlying congestive heart failure but given his associated healthcare associated pneumonia I will hold on any diuresis.   ED Sepsis - Repeat Assessment   Performed at:    1030p 06/30/16  Last Vitals:    Blood pressure (!) 181/77, pulse 85, temperature 98.5 F (36.9 C), temperature source Oral, resp. rate (!) 34, height 5\' 7"  (1.702 m), weight 192 lb (87.1 kg), SpO2 100 %.  Heart:      Clear tones  Lungs:     Diminished bilateral, improved aeration as opposed to arrival but still somewhat diminished without wheezing.  Capillary Refill:   Normal less than once a  Peripheral Pulse (include location): Right radial   Skin (include color):   Normal, no cyanosis  ____________________________________________   FINAL CLINICAL IMPRESSION(S) / ED DIAGNOSES  Final diagnoses:  Sepsis, due to unspecified organism ([place])  HCAP (healthcare-associated pneumonia)  Hypoxia  Respiratory distress  Hypoglycemia      NEW MEDICATIONS STARTED DURING THIS VISIT:  New Prescriptions   No medications on file     Note:  This document was prepared using Dragon voice recognition software and may include unintentional dictation errors.     Delman Kitten,  MD 06/30/16 2230    Delman Kitten, MD 06/30/16 [phone]

## 2016-06-30 NOTE — Progress Notes (Signed)
Pharmacy Antibiotic Note  Patrick [name] is a 83 y.o. male admitted on 06/30/2016 with pneumonia.  Pharmacy has been consulted for vancomycin and cefepime dosing.  Plan: DW 74kg Vd 52L kei 0.028 hr-1  t1/2 25 hours Vancomycin 1250 mg q 36 hours ordered with stacked dosing. Level before 4th dose. Goal trough 15-20.  Cefepime 2 grams q 24 hours ordered.  Height: 5\' 7"  (170.2 cm) Weight: 192 lb (87.1 kg) IBW/kg (Calculated) : 66.1  Temp (24hrs), Avg:98.5 F (36.9 C), Min:98.5 F (36.9 C), Max:98.5 F (36.9 C)   Recent Labs Lab 06/30/16 2126  WBC 16.1*  CREATININE 2.00*  LATICACIDVEN 3.0*    Estimated Creatinine Clearance: 29.5 mL/min (A) (by C-G formula based on SCr of 2 mg/dL (H)).    No Known Allergies  Antimicrobials this admission: vancomycin cefepime 4/8 >>    >>   Dose adjustments this admission:   Microbiology results: 4/8 BCx: pending       4/8 CXR: R midlung opacification. Thank you for allowing pharmacy to be a part of this patient's care.  Jayne Peckenpaugh S 06/30/2016 11:01 PM

## 2016-06-30 NOTE — H&P (Signed)
[facility] NAME: Patrick [name]    MR#:  [id number]  DATE OF BIRTH:  02-18-1932  DATE OF ADMISSION:  06/30/2016  PRIMARY CARE PHYSICIAN: Maryland Pink, MD   REQUESTING/REFERRING PHYSICIAN: Jacqualine Code, MD  CHIEF COMPLAINT:   Chief Complaint  Patient presents with  . Shortness of Breath  . Hemoptysis    HISTORY OF PRESENT ILLNESS:  Patrick [name]  is a 83 y.o. male who presents with Shortness of breath, some hemoptysis. Here in the ED the patient was found to have pneumonia, sepsis criteria, as well as having some heart failure. He was hypoxic and placed on BiPAP. Hospitalists were called for admission  PAST MEDICAL HISTORY:   Past Medical History:  Diagnosis Date  . Amputated finger   . Anemia of chronic disease    a. plan of oncology to start Procrit - received during admission 12/2013  . Atypical chest pain    a. 12/2014 Neg CE - in setting of L pleural effusion.  . Bell's palsy   . Chronic combined systolic and diastolic CHF (congestive heart failure) ([place])    a. 11/2012 Echo: EF 60-65%, mod conc LVH, mildly dil LA/RA, mild Ao sclerosis w/o stenosis; b. 11/2014 Echo: EF 40-45%, prob mid-apicalanteroseptal, ant, apical HK, Gr2 DD, mod dil LA, mildly dil RA (technically difficult study); c. echo 11/2015: EF 60-65%, trivial AI, nl RV sys fxan, PASP 59; d. echo 10/17: EF 60-65%, no RWMA, mildly dilated LA, mildly reduced RV sys fxn, PASP 70  . CKD (chronic kidney disease), stage III    a. stage III-IV  . Diabetes mellitus without complication ([place])   . GERD (gastroesophageal reflux disease)   . History of gastroesophageal reflux (GERD)   . Hyperlipidemia   . Hypertension   . Permanent atrial fibrillation ([place])    a. Dx 12/2011, Rate-controlled, chronic Xarelto (renal dosing) - CHA2DS2VASc = 5.  . Pleural effusion, left    a. 12/2014 s/p thoracentesis - protein <3, LDH 123, no malignancy.    PAST SURGICAL  HISTORY:   Past Surgical History:  Procedure Laterality Date  . APPENDECTOMY    . COLONOSCOPY  09/2011  . ESOPHAGOGASTRODUODENOSCOPY    . FINGER SURGERY     right hand    SOCIAL HISTORY:   Social History  Substance Use Topics  . Smoking status: Former Smoker    Packs/day: 0.25    Years: 10.00    Types: Cigars    Quit date: 05/06/1966  . Smokeless tobacco: Never Used  . Alcohol use No    FAMILY HISTORY:   Family History  Problem Relation Age of Onset  . Heart attack Brother   . Diabetes Mother   . Diabetes Sister   . Hypertension      DRUG ALLERGIES:  No Known Allergies  MEDICATIONS AT HOME:   Prior to Admission medications   Medication Sig Start Date End Date Taking? Authorizing Provider  aspirin 81 MG tablet Take 81 mg by mouth daily.    Historical Provider, MD  budesonide (PULMICORT) 0.25 MG/2ML nebulizer solution Take 2 mLs (0.25 mg total) by nebulization 2 (two) times daily. Dx code J44.9 03/06/16 03/06/17  Flora Lipps, MD  cloNIDine (CATAPRES) 0.1 MG tablet Take 1 tablet (0.1 mg total) by mouth 2 (two) times daily. 04/15/16   Alisa Graff, FNP  formoterol (PERFOROMIST) 20 MCG/2ML nebulizer solution Take 2 mLs (20 mcg total) by nebulization 2 (two) times daily. Dx code j8.9  03/06/16   Flora Lipps, MD  insulin lispro protamine-lispro (HUMALOG MIX 75/25) (75-25) 100 UNIT/ML SUSP injection Inject 50 Units into the skin 2 (two) times daily. Don't take if under 150 01/24/16   Fritzi Mandes, MD  ipratropium-albuterol (DUONEB) 0.5-2.5 (3) MG/3ML SOLN Take 3 mLs by nebulization every 6 (six) hours as needed (Shortness of breath). 04/04/16   Hillary Bow, MD  metoprolol tartrate (LOPRESSOR) 25 MG tablet Take 1 tablet (25 mg total) by mouth 2 (two) times daily. 04/04/16   Srikar Sudini, MD  omeprazole (PRILOSEC) 40 MG capsule Take 1 capsule (40 mg total) by mouth daily. 04/15/16   Alisa Graff, FNP  pravastatin (PRAVACHOL) 40 MG tablet Take 40 mg by mouth daily.    Historical  Provider, MD  Rivaroxaban (XARELTO) 15 MG TABS tablet Take 1 tablet (15 mg total) by mouth daily. 03/14/16   Minna Merritts, MD  torsemide (DEMADEX) 20 MG tablet Take 1 tablet (20 mg total) by mouth every other day. 12/22/15   Henreitta Leber, MD    REVIEW OF SYSTEMS:  Review of Systems  Constitutional: Negative for chills, fever, malaise/fatigue and weight loss.  HENT: Negative for ear pain, hearing loss and tinnitus.   Eyes: Negative for blurred vision, double vision, pain and redness.  Respiratory: Positive for cough, hemoptysis and shortness of breath.   Cardiovascular: Negative for chest pain, palpitations, orthopnea and leg swelling.  Gastrointestinal: Negative for abdominal pain, constipation, diarrhea, nausea and vomiting.  Genitourinary: Negative for dysuria, frequency and hematuria.  Musculoskeletal: Negative for back pain, joint pain and neck pain.  Skin:       No acne, rash, or lesions  Neurological: Negative for dizziness, tremors, focal weakness and weakness.  Endo/Heme/Allergies: Negative for polydipsia. Does not bruise/bleed easily.  Psychiatric/Behavioral: Negative for depression. The patient is not nervous/anxious and does not have insomnia.      VITAL SIGNS:   Vitals:   06/30/16 2115 06/30/16 2130 06/30/16 2200 06/30/16 2300  BP:  (!) 190/82 (!) 181/77 (!) 177/84  Pulse:  95 85 78  Resp:  (!) 31 (!) 34 (!) 25  Temp:      TempSrc:      SpO2:  (!) 88% 100% 96%  Weight: 87.1 kg (192 lb)     Height: 5\' 7"  (1.702 m)      Wt Readings from Last 3 Encounters:  06/30/16 87.1 kg (192 lb)  06/24/16 91.2 kg (201 lb)  05/17/16 82.6 kg (182 lb)    PHYSICAL EXAMINATION:  Physical Exam  Vitals reviewed. Constitutional: He is oriented to person, place, and time. He appears well-developed and well-nourished. No distress.  HENT:  Head: Normocephalic and atraumatic.  Mouth/Throat: Oropharynx is clear and moist.  Eyes: Conjunctivae and EOM are normal. Pupils are equal,  round, and reactive to light. No scleral icterus.  Neck: Normal range of motion. Neck supple. No JVD present. No thyromegaly present.  Cardiovascular: Normal rate, regular rhythm and intact distal pulses.  Exam reveals no gallop and no friction rub.   No murmur heard. Respiratory: He is in respiratory distress. He has no wheezes. He has rales.  ronchi  GI: Soft. Bowel sounds are normal. He exhibits no distension. There is no tenderness.  Musculoskeletal: Normal range of motion. He exhibits no edema.  No arthritis, no gout  Lymphadenopathy:    He has no cervical adenopathy.  Neurological: He is alert and oriented to person, place, and time. No cranial nerve deficit.  No dysarthria, no  aphasia  Skin: Skin is warm and dry. No rash noted. No erythema.  Psychiatric: He has a normal mood and affect. His behavior is normal. Judgment and thought content normal.    LABORATORY PANEL:   CBC  Recent Labs Lab 06/30/16 2126  WBC 16.1*  HGB 8.2*  HCT 27.5*  PLT 347   ------------------------------------------------------------------------------------------------------------------  Chemistries   Recent Labs Lab 06/30/16 2126  NA 140  K 2.7*  CL 95*  CO2 35*  GLUCOSE 50*  BUN 26*  CREATININE 2.00*  CALCIUM 9.0   ------------------------------------------------------------------------------------------------------------------  Cardiac Enzymes  Recent Labs Lab 06/30/16 2126  TROPONINI 0.03*   ------------------------------------------------------------------------------------------------------------------  RADIOLOGY:  Dg Chest Portable 1 View  Result Date: 06/30/2016 CLINICAL DATA:  Shortness of breath. Oxygen dependent. Mild hemoptysis. EXAM: PORTABLE CHEST 1 VIEW COMPARISON:  04/02/2016 and 01/22/2016 FINDINGS: Lungs are adequately inflated with stable bibasilar opacification and worsening airspace opacification over the right midlung suggesting acute on chronic process and  may be due to infection. Mild blunting of the costophrenic angles unchanged. Cardiomegaly unchanged. Calcified plaque over the thoracic aorta. Degenerative change of the spine. IMPRESSION: Worsening airspace opacification over the right midlung which may represent acute infection. Chronic bibasilar opacification and chronic blunting of the costophrenic angles. Stable cardiomegaly.  Aortic atherosclerosis. Electronically Signed   By: Marin Olp M.D.   On: 06/30/2016 22:09    EKG:   Orders placed or performed during the hospital encounter of 06/30/16  . EKG 12-Lead  . EKG 12-Lead    IMPRESSION AND PLAN:  Principal Problem:   Sepsis ([place]) - broad spectrum IV antibiotics, lactic acid was elevated at 3 - given the patient's heart failure we will not give him fluid boluses but will instead use gentle IV fluids until his lactate returns to normal with BiPAP support as below, patient was hemodynamically stable, cultures were sent from the ED Active Problems:   Acute on chronic combined systolic and diastolic CHF (congestive heart failure) (HCC) - patient's breathing and oxygenation improved on BiPAP, we will keep this in place for now as he does seem to get some gentle IV fluids for his lactic acidosis as above.   HCAP (healthcare-associated pneumonia) - antibiotics and cultures as above, other supportive care as above   Persistent atrial fibrillation ([place]) - continue home meds   Hypertension - continue home meds, as well as additional when necessary antihypertensives as the patient's blood pressure was significantly elevated, likely exacerbating his heart failure further   Diabetes mellitus ([place]) - sliding scale insulin with corresponding glucose checks   CKD (chronic kidney disease), stage III - stable, avoid nephrotoxins and monitor  All the records are reviewed and case discussed with ED provider. Management plans discussed with the patient and/or family.  DVT PROPHYLAXIS: Systemic  anticoagulation  GI PROPHYLAXIS: PPI  ADMISSION STATUS: Inpatient  CODE STATUS: Full Code Status History    Date Active Date Inactive Code Status Order ID Comments User Context   04/02/2016  4:32 PM 04/04/2016  5:52 PM Full Code [id number]  Hillary Bow, MD ED   01/24/2016  9:34 AM 01/24/2016  8:11 PM Full Code [id number]  Serenity D Kirkendall, RN Inpatient   01/22/2016  4:41 PM 01/24/2016  9:32 AM DNR [id number]  Demetrios Loll, MD Inpatient   12/21/2015  2:25 AM 12/21/2015  5:33 PM Full Code [id number]  Harvie Bridge, DO ED   11/20/2015 10:33 PM 11/22/2015  8:09 PM Full Code [id number]  Lance Coon, MD Inpatient   12/23/2014 11:44  AM 12/26/2014  7:40 PM Partial Code [id number]  Hillary Bow, MD Inpatient   12/23/2014  1:27 AM 12/23/2014 11:44 AM Full Code [id number]  Juluis Mire, MD Inpatient    Advance Directive Documentation     Most Recent Value  Type of Advance Directive  Healthcare Power of Attorney  Pre-existing out of facility DNR order (yellow form or pink MOST form)  -  "MOST" Form in Place?  -      TOTAL TIME TAKING CARE OF THIS PATIENT: 45 minutes.   Alicha Raspberry FIELDING 06/30/2016, 11:04 PM  [facility]  Office  [phone]  CC: Primary care physician; Maryland Pink, MD  Note:  This document was prepared using Dragon voice recognition software and may include unintentional dictation errors.

## 2016-06-30 NOTE — ED Triage Notes (Signed)
Pt went to visit daughter = states feeling a little short of breath at that time but became much worse. Per ems sp02 on his normal 3 liters was 83% - on 5 liters 94%. Pt also coughed up bloody sputum for ems and during triage

## 2016-06-30 NOTE — ED Notes (Signed)
Blood, blood cultures and urine sent

## 2016-07-01 ENCOUNTER — Inpatient Hospital Stay

## 2016-07-01 DIAGNOSIS — J189 Pneumonia, unspecified organism: Secondary | ICD-10-CM

## 2016-07-01 DIAGNOSIS — I5043 Acute on chronic combined systolic (congestive) and diastolic (congestive) heart failure: Secondary | ICD-10-CM

## 2016-07-01 LAB — GLUCOSE, CAPILLARY
GLUCOSE-CAPILLARY: 102 mg/dL — AB (ref 65–99)
GLUCOSE-CAPILLARY: 199 mg/dL — AB (ref 65–99)
GLUCOSE-CAPILLARY: 67 mg/dL (ref 65–99)
GLUCOSE-CAPILLARY: 75 mg/dL (ref 65–99)
GLUCOSE-CAPILLARY: 92 mg/dL (ref 65–99)
Glucose-Capillary: 114 mg/dL — ABNORMAL HIGH (ref 65–99)
Glucose-Capillary: 89 mg/dL (ref 65–99)
Glucose-Capillary: 91 mg/dL (ref 65–99)
Glucose-Capillary: 133 mg/dL — ABNORMAL HIGH (ref 65–99)
Glucose-Capillary: 279 mg/dL — ABNORMAL HIGH (ref 65–99)

## 2016-07-01 LAB — BASIC METABOLIC PANEL
Anion gap: 6 (ref 5–15)
Anion gap: 6 (ref 5–15)
BUN: 23 mg/dL — AB (ref 6–20)
BUN: 25 mg/dL — AB (ref 6–20)
CALCIUM: 8.6 mg/dL — AB (ref 8.9–10.3)
CO2: 37 mmol/L — ABNORMAL HIGH (ref 22–32)
CO2: 35 mmol/L — ABNORMAL HIGH (ref 22–32)
CREATININE: 2.05 mg/dL — AB (ref 0.61–1.24)
Calcium: 8.4 mg/dL — ABNORMAL LOW (ref 8.9–10.3)
Chloride: 95 mmol/L — ABNORMAL LOW (ref 101–111)
Chloride: 98 mmol/L — ABNORMAL LOW (ref 101–111)
Creatinine, Ser: 1.89 mg/dL — ABNORMAL HIGH (ref 0.61–1.24)
GFR calc Af Amer: 33 mL/min — ABNORMAL LOW (ref >60)
GFR calc Af Amer: 36 mL/min — ABNORMAL LOW (ref >60)
GFR, EST NON AFRICAN AMERICAN: 28 mL/min — AB (ref >60)
GFR, EST NON AFRICAN AMERICAN: 31 mL/min — AB (ref >60)
GLUCOSE: 89 mg/dL (ref 65–99)
Glucose, Bld: 134 mg/dL — ABNORMAL HIGH (ref 65–99)
POTASSIUM: 2.9 mmol/L — AB (ref 3.5–5.1)
POTASSIUM: 3 mmol/L — AB (ref 3.5–5.1)
SODIUM: 138 mmol/L (ref 135–145)
Sodium: 139 mmol/L (ref 135–145)

## 2016-07-01 LAB — CBC
HCT: 24.1 % — ABNORMAL LOW (ref 40.0–52.0)
HCT: 22.3 % — ABNORMAL LOW (ref 40.0–52.0)
Hemoglobin: 7.4 g/dL — ABNORMAL LOW (ref 13.0–18.0)
Hemoglobin: 6.9 g/dL — ABNORMAL LOW (ref 13.0–18.0)
MCH: 23.2 pg — AB (ref 26.0–34.0)
MCH: 23.4 pg — ABNORMAL LOW (ref 26.0–34.0)
MCHC: 30.6 g/dL — AB (ref 32.0–36.0)
MCHC: 31 g/dL — AB (ref 32.0–36.0)
MCV: 75.8 fL — ABNORMAL LOW (ref 80.0–100.0)
MCV: 75.6 fL — AB (ref 80.0–100.0)
PLATELETS: 212 10*3/uL (ref 150–440)
PLATELETS: 257 10*3/uL (ref 150–440)
RBC: 3.18 MIL/uL — ABNORMAL LOW (ref 4.40–5.90)
RBC: 2.95 MIL/uL — AB (ref 4.40–5.90)
RDW: 20.8 % — ABNORMAL HIGH (ref 11.5–14.5)
RDW: 21.1 % — ABNORMAL HIGH (ref 11.5–14.5)
WBC: 12.5 10*3/uL — AB (ref 3.8–10.6)
WBC: 8.8 10*3/uL (ref 3.8–10.6)

## 2016-07-01 LAB — PHOSPHORUS: Phosphorus: 3.1 mg/dL (ref 2.5–4.6)

## 2016-07-01 LAB — PROCALCITONIN
Procalcitonin: 0.11 ng/mL
Procalcitonin: 0.11 ng/mL

## 2016-07-01 LAB — TROPONIN I
TROPONIN I: 0.03 ng/mL — AB (ref <0.03)
TROPONIN I: 0.04 ng/mL — AB (ref <0.03)
Troponin I: 0.04 ng/mL (ref <0.03)

## 2016-07-01 LAB — PREPARE RBC (CROSSMATCH)

## 2016-07-01 LAB — LACTIC ACID, PLASMA
LACTIC ACID, VENOUS: 2.2 mmol/L — AB (ref 0.5–1.9)
Lactic Acid, Venous: 1.1 mmol/L (ref 0.5–1.9)

## 2016-07-01 LAB — POTASSIUM: POTASSIUM: 4.1 mmol/L (ref 3.5–5.1)

## 2016-07-01 LAB — ABO/RH: ABO/RH(D): A POS

## 2016-07-01 LAB — MAGNESIUM: Magnesium: 1.8 mg/dL (ref 1.7–2.4)

## 2016-07-01 LAB — MRSA PCR SCREENING: MRSA by PCR: NEGATIVE

## 2016-07-01 MED ORDER — MORPHINE SULFATE (PF) 4 MG/ML IV SOLN
INTRAVENOUS | Status: AC
  Filled 2016-07-01: qty 1

## 2016-07-01 MED ORDER — BUDESONIDE 0.5 MG/2ML IN SUSP
0.5000 mg | Freq: Two times a day (BID) | RESPIRATORY_TRACT | Status: DC
  Administered 2016-07-01 – 2016-07-02 (×2): 0.5 mg via RESPIRATORY_TRACT
  Filled 2016-07-01 (×2): qty 2

## 2016-07-01 MED ORDER — ALPRAZOLAM 1 MG PO TABS
1.0000 mg | ORAL_TABLET | Freq: Once | ORAL | Status: AC
  Administered 2016-07-01: 1 mg via ORAL
  Filled 2016-07-01: qty 1

## 2016-07-01 MED ORDER — METHYLPREDNISOLONE SODIUM SUCC 40 MG IJ SOLR
20.0000 mg | Freq: Once | INTRAMUSCULAR | Status: AC
  Administered 2016-07-01: 20 mg via INTRAVENOUS
  Filled 2016-07-01: qty 1

## 2016-07-01 MED ORDER — SODIUM CHLORIDE 0.9 % IV SOLN
Freq: Once | INTRAVENOUS | Status: AC
  Administered 2016-07-01: 19:00:00 via INTRAVENOUS

## 2016-07-01 MED ORDER — PRAVASTATIN SODIUM 40 MG PO TABS
40.0000 mg | ORAL_TABLET | Freq: Every day | ORAL | Status: DC
  Administered 2016-07-01: 40 mg via ORAL
  Filled 2016-07-01: qty 1

## 2016-07-01 MED ORDER — IPRATROPIUM-ALBUTEROL 0.5-2.5 (3) MG/3ML IN SOLN
3.0000 mL | RESPIRATORY_TRACT | Status: DC
  Administered 2016-07-01 – 2016-07-02 (×5): 3 mL via RESPIRATORY_TRACT
  Filled 2016-07-01 (×6): qty 3

## 2016-07-01 MED ORDER — CLONIDINE HCL 0.1 MG PO TABS
0.1000 mg | ORAL_TABLET | Freq: Two times a day (BID) | ORAL | Status: DC
  Administered 2016-07-01 – 2016-07-02 (×4): 0.1 mg via ORAL
  Filled 2016-07-01 (×4): qty 1

## 2016-07-01 MED ORDER — POTASSIUM CHLORIDE CRYS ER 20 MEQ PO TBCR
40.0000 meq | EXTENDED_RELEASE_TABLET | Freq: Once | ORAL | Status: AC
  Administered 2016-07-01: 40 meq via ORAL
  Filled 2016-07-01: qty 2

## 2016-07-01 MED ORDER — INSULIN ASPART 100 UNIT/ML ~~LOC~~ SOLN
0.0000 [IU] | Freq: Three times a day (TID) | SUBCUTANEOUS | Status: DC
  Administered 2016-07-01: 2 [IU] via SUBCUTANEOUS
  Administered 2016-07-01: 1 [IU] via SUBCUTANEOUS
  Administered 2016-07-02 (×2): 7 [IU] via SUBCUTANEOUS
  Administered 2016-07-02: 5 [IU] via SUBCUTANEOUS
  Filled 2016-07-01: qty 5
  Filled 2016-07-01: qty 7
  Filled 2016-07-01: qty 2
  Filled 2016-07-01: qty 1
  Filled 2016-07-01: qty 7

## 2016-07-01 MED ORDER — METHYLPREDNISOLONE SODIUM SUCC 40 MG IJ SOLR: 20.0000 mg | Freq: Two times a day (BID) | INTRAMUSCULAR | Status: DC

## 2016-07-01 MED ORDER — GUAIFENESIN-DM 100-10 MG/5ML PO SYRP
5.0000 mL | ORAL_SOLUTION | ORAL | Status: DC | PRN
  Administered 2016-07-01: 5 mL via ORAL
  Filled 2016-07-01: qty 5

## 2016-07-01 MED ORDER — IPRATROPIUM-ALBUTEROL 0.5-2.5 (3) MG/3ML IN SOLN
3.0000 mL | Freq: Four times a day (QID) | RESPIRATORY_TRACT | Status: DC
  Administered 2016-07-01: 3 mL via RESPIRATORY_TRACT
  Filled 2016-07-01: qty 3

## 2016-07-01 MED ORDER — ASPIRIN 81 MG PO CHEW
81.0000 mg | CHEWABLE_TABLET | Freq: Every day | ORAL | Status: DC
Start: 2016-07-01 — End: 2016-07-02
  Administered 2016-07-01 – 2016-07-02 (×2): 81 mg via ORAL
  Filled 2016-07-01 (×2): qty 1

## 2016-07-01 MED ORDER — BENZONATATE 100 MG PO CAPS: 200.0000 mg | ORAL_CAPSULE | Freq: Three times a day (TID) | ORAL | Status: DC | PRN

## 2016-07-01 MED ORDER — LABETALOL HCL 5 MG/ML IV SOLN
10.0000 mg | INTRAVENOUS | Status: DC | PRN
  Administered 2016-07-01: 10 mg via INTRAVENOUS
  Filled 2016-07-01 (×3): qty 4

## 2016-07-01 MED ORDER — SODIUM CHLORIDE 0.9 % IV SOLN: INTRAVENOUS | Status: DC

## 2016-07-01 MED ORDER — INSULIN ASPART 100 UNIT/ML ~~LOC~~ SOLN
0.0000 [IU] | Freq: Every day | SUBCUTANEOUS | Status: DC
  Administered 2016-07-01: 3 [IU] via SUBCUTANEOUS
  Filled 2016-07-01: qty 3

## 2016-07-01 MED ORDER — SODIUM CHLORIDE 0.9% FLUSH
3.0000 mL | Freq: Two times a day (BID) | INTRAVENOUS | Status: DC
  Administered 2016-07-01 – 2016-07-02 (×3): 3 mL via INTRAVENOUS

## 2016-07-01 MED ORDER — IPRATROPIUM-ALBUTEROL 0.5-2.5 (3) MG/3ML IN SOLN: 3.0000 mL | RESPIRATORY_TRACT | Status: DC | PRN

## 2016-07-01 MED ORDER — ONDANSETRON HCL 4 MG PO TABS: 4.0000 mg | ORAL_TABLET | Freq: Four times a day (QID) | ORAL | Status: DC | PRN

## 2016-07-01 MED ORDER — METHYLPREDNISOLONE SODIUM SUCC 40 MG IJ SOLR
20.0000 mg | Freq: Two times a day (BID) | INTRAMUSCULAR | Status: DC
  Administered 2016-07-01 – 2016-07-02 (×2): 20 mg via INTRAVENOUS
  Filled 2016-07-01 (×2): qty 1

## 2016-07-01 MED ORDER — TORSEMIDE 20 MG PO TABS
20.0000 mg | ORAL_TABLET | Freq: Every day | ORAL | Status: DC
  Administered 2016-07-01 – 2016-07-02 (×2): 20 mg via ORAL
  Filled 2016-07-01 (×2): qty 1

## 2016-07-01 MED ORDER — PRAVASTATIN SODIUM 40 MG PO TABS
80.0000 mg | ORAL_TABLET | Freq: Every day | ORAL | Status: DC
  Administered 2016-07-02: 80 mg via ORAL
  Filled 2016-07-01: qty 2

## 2016-07-01 MED ORDER — DEXTROSE 50 % IV SOLN
INTRAVENOUS | Status: AC
  Administered 2016-07-01: 50 mL via INTRAVENOUS
  Filled 2016-07-01: qty 50

## 2016-07-01 MED ORDER — PREDNISOLONE 5 MG PO TABS
5.0000 mg | ORAL_TABLET | Freq: Every day | ORAL | Status: DC
  Filled 2016-07-01: qty 1

## 2016-07-01 MED ORDER — DEXTROSE 50 % IV SOLN
50.0000 mL | Freq: Once | INTRAVENOUS | Status: AC
  Administered 2016-07-01: 50 mL via INTRAVENOUS

## 2016-07-01 MED ORDER — ACETAMINOPHEN 650 MG RE SUPP: 650.0000 mg | Freq: Four times a day (QID) | RECTAL | Status: DC | PRN

## 2016-07-01 MED ORDER — HYDRALAZINE HCL 20 MG/ML IJ SOLN: 10.0000 mg | Freq: Four times a day (QID) | INTRAMUSCULAR | Status: DC | PRN

## 2016-07-01 MED ORDER — POTASSIUM CHLORIDE 2 MEQ/ML IV SOLN
30.0000 meq | Freq: Once | INTRAVENOUS | Status: DC
  Filled 2016-07-01: qty 15

## 2016-07-01 MED ORDER — ONDANSETRON HCL 4 MG/2ML IJ SOLN: 4.0000 mg | Freq: Four times a day (QID) | INTRAMUSCULAR | Status: DC | PRN

## 2016-07-01 MED ORDER — DEXTROSE-NACL 5-0.45 % IV SOLN
INTRAVENOUS | Status: DC
  Administered 2016-07-01: 01:00:00 via INTRAVENOUS

## 2016-07-01 MED ORDER — RIVAROXABAN 15 MG PO TABS
15.0000 mg | ORAL_TABLET | Freq: Every day | ORAL | Status: DC
  Administered 2016-07-01: 15 mg via ORAL
  Filled 2016-07-01: qty 1

## 2016-07-01 MED ORDER — ARFORMOTEROL TARTRATE 15 MCG/2ML IN NEBU
15.0000 ug | INHALATION_SOLUTION | Freq: Two times a day (BID) | RESPIRATORY_TRACT | Status: DC
  Administered 2016-07-01 – 2016-07-02 (×2): 15 ug via RESPIRATORY_TRACT
  Filled 2016-07-01 (×5): qty 2

## 2016-07-01 MED ORDER — MAGNESIUM SULFATE 2 GM/50ML IV SOLN
2.0000 g | Freq: Once | INTRAVENOUS | Status: AC
  Administered 2016-07-01: 2 g via INTRAVENOUS
  Filled 2016-07-01: qty 50

## 2016-07-01 MED ORDER — ACETAMINOPHEN 325 MG PO TABS: 650.0000 mg | ORAL_TABLET | Freq: Four times a day (QID) | ORAL | Status: DC | PRN

## 2016-07-01 MED ORDER — BUDESONIDE 0.25 MG/2ML IN SUSP
0.2500 mg | Freq: Two times a day (BID) | RESPIRATORY_TRACT | Status: DC
  Administered 2016-07-01: 0.25 mg via RESPIRATORY_TRACT
  Filled 2016-07-01: qty 2

## 2016-07-01 MED ORDER — PREDNISONE 10 MG PO TABS
5.0000 mg | ORAL_TABLET | Freq: Every day | ORAL | Status: DC
  Administered 2016-07-01: 5 mg via ORAL
  Filled 2016-07-01: qty 1

## 2016-07-01 MED ORDER — PANTOPRAZOLE SODIUM 40 MG PO TBEC
40.0000 mg | DELAYED_RELEASE_TABLET | Freq: Every day | ORAL | Status: DC
  Administered 2016-07-01 – 2016-07-02 (×2): 40 mg via ORAL
  Filled 2016-07-01 (×2): qty 1

## 2016-07-01 MED ORDER — MORPHINE SULFATE (PF) 4 MG/ML IV SOLN
2.0000 mg | Freq: Once | INTRAVENOUS | Status: AC
  Administered 2016-07-01: 2 mg via INTRAVENOUS

## 2016-07-01 NOTE — Care Management (Signed)
Received notification that patient is currently open to [facility] and from home with his wife. He is on chronic home O2 at baseline 4 L. He also has a home nebulizer per hospice resource. RNCM to continue to follow.

## 2016-07-01 NOTE — Progress Notes (Signed)
Patient had episode of anxiety, tachypnea, restlessness, and sense of impending doom. Patient making comments such as, "I don't feel right anymore, I'm going away from here, I'm not going to make it." Dr. Mortimer Fries gave verbal orders for 2 mg morphine IV and 1 mg Ativan PO one time. Same was given to patient. Emotional support given to the patient and has family. Patient is now eating his lunch and appears to be much more relaxed than he was.

## 2016-07-01 NOTE — Progress Notes (Signed)
[place] for electrolyte management   Pharmacy consulted for electrolyte management for 83 yo male admitted with CHF exacerbation and PNA. Patient resumed on torsemide 20mg  daily.    Plan:  Patient received potassium 30 mEq IV x 1 and potassium 40 mEq PO x 1. Potassium is currently in range.   Will recheck electrolytes with am labs.   No Known Allergies  Patient Measurements: Height: 5\' 8"  (172.7 cm) Weight: 207 lb 0.2 oz (93.9 kg) IBW/kg (Calculated) : 68.4  Vital Signs: Temp: 97.9 F (36.6 C) (04/09 0800) Temp Source: Oral (04/09 0800) BP: 167/73 (04/09 1400) Pulse Rate: 84 (04/09 1400) Intake/Output from previous day: 04/08 0701 - 04/09 0700 In: 546.7 [I.V.:231.7; IV Piggyback:315] Out: 325 [Urine:325] Intake/Output from this shift: Total I/O In: 40 [I.V.:40] Out: 225 [Urine:225]  Labs:  Recent Labs  06/30/16 2126 07/01/16 0054 07/01/16 0645  WBC 16.1* 12.5* 8.8  HGB 8.2* 7.4* 6.9*  HCT 27.5* 24.1* 22.3*  PLT 347 257 212  APTT 44*  --   --   CREATININE 2.00* 2.05* 1.89*  MG  --   --  1.8  PHOS  --   --  3.1   Estimated Creatinine Clearance: 32.9 mL/min (A) (by C-G formula based on SCr of 1.89 mg/dL (H)).    Medical History: Past Medical History:  Diagnosis Date  . Amputated finger   . Anemia of chronic disease    a. plan of oncology to start Procrit - received during admission 12/2013  . Atypical chest pain    a. 12/2014 Neg CE - in setting of L pleural effusion.  . Bell's palsy   . Chronic combined systolic and diastolic CHF (congestive heart failure) ([place])    a. 11/2012 Echo: EF 60-65%, mod conc LVH, mildly dil LA/RA, mild Ao sclerosis w/o stenosis; b. 11/2014 Echo: EF 40-45%, prob mid-apicalanteroseptal, ant, apical HK, Gr2 DD, mod dil LA, mildly dil RA (technically difficult study); c. echo 11/2015: EF 60-65%, trivial AI, nl RV sys fxan, PASP 59; d. echo 10/17: EF 60-65%, no RWMA, mildly dilated LA, mildly  reduced RV sys fxn, PASP 70  . CKD (chronic kidney disease), stage III    a. stage III-IV  . Diabetes mellitus without complication ([place])   . GERD (gastroesophageal reflux disease)   . History of gastroesophageal reflux (GERD)   . Hyperlipidemia   . Hypertension   . Permanent atrial fibrillation ([place])    a. Dx 12/2011, Rate-controlled, chronic Xarelto (renal dosing) - CHA2DS2VASc = 5.  . Pleural effusion, left    a. 12/2014 s/p thoracentesis - protein <3, LDH 123, no malignancy.   Pharmacy will continue to monitor and adjust per consult.    Simpson,Michael L 07/01/2016,4:08 PM

## 2016-07-01 NOTE — Progress Notes (Signed)
Report called to [place], RN on 1-A. Patient transported to to room 135 on hospital bed w/ O2 @ 3 lpm via [place]. Patient's daughter and wife accompanied him. Belongings with patient's family.

## 2016-07-01 NOTE — Progress Notes (Signed)
Pharmacy Antibiotic Note  Patrick [name] is a 83 y.o. male admitted on 06/30/2016 with respiratory failure secondary to pneumonia and CHF exacerbation. Pharmacy has been consulted for cefepime dosing.  Plan: Continue cefepime 2g IV Q24hr.    Patient's procalcitonin is 0.11 and MRSA PCR is negative. Vancomycin discontinued on am rounds.   Height: 5\' 8"  (172.7 cm) Weight: 207 lb 0.2 oz (93.9 kg) IBW/kg (Calculated) : 68.4  Temp (24hrs), Avg:98.3 F (36.8 C), Min:97.9 F (36.6 C), Max:98.7 F (37.1 C)   Recent Labs Lab 06/30/16 2126 07/01/16 0054 07/01/16 0645  WBC 16.1* 12.5* 8.8  CREATININE 2.00* 2.05* 1.89*  LATICACIDVEN 3.0* 2.2* 1.1    Estimated Creatinine Clearance: 32.9 mL/min (A) (by C-G formula based on SCr of 1.89 mg/dL (H)).    No Known Allergies  Antimicrobials this admission: Vancomycin 4/8 >> 4/9 Cefepime 4/8 >>   Dose adjustments this admission: N/A  Microbiology results: 4/8 BCx: no growth < 12 hours 4/9 MRSA PCR: negative   Thank you for allowing pharmacy to be a part of this patient's care.  Vito Beg L 07/01/2016 1:23 PM

## 2016-07-01 NOTE — Progress Notes (Signed)
Visit made. Patient is currently followed by Hospice and [facility] at home with a diagnosis of COD. He lives with his wife, has oxygen at home and is a FULL CODE. He came to the [facility] ED via EMS called by family for evaluation of increased shortness of breath. He has been admitted to [facility] for treatment of sepsis. Patient seen lying in the ICU bed, alert and oriented, watching television with wife Jocelyn Lamer and daughter [name] at bedside. Oxygen in place via nasal cannula RT present to give nebulizer treatment. Patient is receiving IV steroids and IV antibiotics. Per chart note review plan is for patient to transfer out of ICU to telemetry today. He has received a dose of PRN labetalol for increased blood pressure overnight. Writer provided education to patient and his family regarding Liaison role, all voiced understanding. No needs voiced at time of visit, patient continued watching television, no complaints of pain or dyspnea at time of visit, he reports feeling better and breathing better. Hospital care team all aware that patient is a current hospice patient. Will continue to follow and update Hospice team. Thank you. Flo Shanks RN, BSN, [facility] [place], hospital Liaison [phone] c

## 2016-07-01 NOTE — Progress Notes (Signed)
[facility] [place] NAME: Patrick [name]    MR#:  [id number]  DATE OF BIRTH:  [birth year]  SUBJECTIVE:  CHIEF COMPLAINT:   Chief Complaint  Patient presents with  . Shortness of Breath  . Hemoptysis   -Admitted with respiratory distress secondary to pneumonia and CHF. -Currently off of BiPAP. Feels better. Back on 3 L home oxygen  REVIEW OF SYSTEMS:  Review of Systems  Constitutional: Positive for malaise/fatigue. Negative for chills and fever.  HENT: Negative for ear discharge, hearing loss and nosebleeds.   Eyes: Negative for blurred vision and double vision.  Respiratory: Positive for cough and shortness of breath. Negative for wheezing.   Cardiovascular: Positive for leg swelling. Negative for chest pain and palpitations.  Gastrointestinal: Negative for abdominal pain, constipation, diarrhea, nausea and vomiting.  Genitourinary: Negative for dysuria.  Musculoskeletal: Negative for myalgias.  Neurological: Negative for dizziness, sensory change, speech change, focal weakness, seizures and headaches.  Psychiatric/Behavioral: Negative for depression.    DRUG ALLERGIES:  No Known Allergies  VITALS:  Blood pressure (!) 149/64, pulse 67, temperature 97.9 F (36.6 C), temperature source Oral, resp. rate (!) 22, height 5\' 8"  (1.727 m), weight 93.9 kg (207 lb 0.2 oz), SpO2 97 %.  PHYSICAL EXAMINATION:  Physical Exam  GENERAL:  83 y.o.-year-old elderly patient lying in the bed with no acute distress.  EYES: Pupils equal, round, reactive to light and accommodation. No scleral icterus. Extraocular muscles intact.  HEENT: Head atraumatic, normocephalic. Oropharynx and nasopharynx clear.  NECK:  Supple, no jugular venous distention. No thyroid enlargement, no tenderness.  LUNGS: scant breath sounds bilaterally, bibasilar crackles,  no wheezing, rhonchi or crepitation. No use of accessory muscles of respiration.  CARDIOVASCULAR:  S1, S2 normal. No rubs, or gallops. 3/6 systolic murmur present ABDOMEN: Soft, nontender, nondistended. Bowel sounds present. No organomegaly or mass.  EXTREMITIES: No cyanosis, or clubbing. 3+ pedal edema present NEUROLOGIC: Cranial nerves II through XII are intact. Muscle strength 5/5 in all extremities. Sensation intact. Gait not checked. Global weakness noted. PSYCHIATRIC: The patient is alert and oriented x 2-3.  SKIN: No obvious rash, lesion, or ulcer.    LABORATORY PANEL:   CBC  Recent Labs Lab 07/01/16 0645  WBC 8.8  HGB 6.9*  HCT 22.3*  PLT 212   ------------------------------------------------------------------------------------------------------------------  Chemistries   Recent Labs Lab 07/01/16 0645  NA 139  K 3.0*  CL 98*  CO2 35*  GLUCOSE 89  BUN 23*  CREATININE 1.89*  CALCIUM 8.4*  MG 1.8   ------------------------------------------------------------------------------------------------------------------  Cardiac Enzymes  Recent Labs Lab 07/01/16 0639  TROPONINI 0.03*   ------------------------------------------------------------------------------------------------------------------  RADIOLOGY:  Dg Chest Port 1 View  Result Date: 07/01/2016 CLINICAL DATA:  Acute respiratory failure. EXAM: PORTABLE CHEST 1 VIEW COMPARISON:  One-view chest x-ray 4/8/ 18 and two-view chest x-ray 04/02/2016. FINDINGS: The heart is mildly enlarged. Atherosclerotic calcifications are present at the aortic arch. Chronic interstitial coarsening is present. Asymmetric superimposed right upper or superior segment lower lobe airspace disease is again seen. Chronic scarring is present at both bases. Pleural thickening is stable. IMPRESSION: 1. Persistent right upper or lower lobe airspace disease remains concerning for pneumonia. 2. Chronic interstitial lung disease. 3. Atherosclerosis of the aorta. Electronically Signed   By: San Morelle M.D.   On: 07/01/2016 07:18    Dg Chest Portable 1 View  Result Date: 06/30/2016 CLINICAL DATA:  Shortness of breath. Oxygen dependent. Mild hemoptysis. EXAM: PORTABLE CHEST 1 VIEW  COMPARISON:  04/02/2016 and 01/22/2016 FINDINGS: Lungs are adequately inflated with stable bibasilar opacification and worsening airspace opacification over the right midlung suggesting acute on chronic process and may be due to infection. Mild blunting of the costophrenic angles unchanged. Cardiomegaly unchanged. Calcified plaque over the thoracic aorta. Degenerative change of the spine. IMPRESSION: Worsening airspace opacification over the right midlung which may represent acute infection. Chronic bibasilar opacification and chronic blunting of the costophrenic angles. Stable cardiomegaly.  Aortic atherosclerosis. Electronically Signed   By: Marin Olp M.D.   On: 06/30/2016 22:09    EKG:   Orders placed or performed during the hospital encounter of 06/30/16  . EKG 12-Lead  . EKG 12-Lead    ASSESSMENT AND PLAN:   83 year old male with past medical history significant for CK D stage IV, atrial fibrillation on Xarelto, diabetes mellitus, systolic congestive heart failure presents to the hospital secondary to hemoptysis and dyspneic and hypoxia.  #1 acute hypoxic respiratory failure-secondary to pneumonia and also CHF exacerbation. -On chronically 3 L home oxygen, required BiPAP and admission. Currently back on 3 L at this time. -Chest x-ray with chronic interstitial lung disease and right lower lobe pneumonia -Continue cefepime and vancomycin. Check MRSA PCR.  #2 chronic systolic CHF-EF is 81-10%. Also severe pulmonary hypertension. -Chest x-ray with no edema. Restart oral torsemide.  #3 anemia-anemia of chronic kidney disease. Received IV iron any pushups as outpatient. -Hemoglobin currently is at 6.9. Recommend 1 unit transfusion. -Might not be a good candidate for Xarelto due to chronic anemia. Discussed with his cardiology team.  #4  CK D stage IV-baseline creatinine around 2. Stable at this time. Monitor closely.  #5 diabetes mellitus-will start on sliding scale insulin.  #6 DVT prophylaxis-currently on Xarelto  Physical therapy consult that. Patient is followed by hospice at home    All the records are reviewed and case discussed with Care Management/Social Workerr. Management plans discussed with the patient, family and they are in agreement.  CODE STATUS: Full Code  TOTAL TIME TAKING CARE OF THIS PATIENT: 38 minutes.   POSSIBLE D/C IN 2 DAYS, DEPENDING ON CLINICAL CONDITION.   Gladstone Lighter M.D on 07/01/2016 at 10:49 AM  Between 7am to 6pm - Pager - [phone]  After 6pm go to [email or web address] - password [email or web address] [facility]  Office  [phone]  CC: Primary care physician; Maryland Pink, MD

## 2016-07-01 NOTE — Progress Notes (Signed)
Patient was transferred from CCU. On 3L O2. A&O x3. Tele applied. Oriented to room, call light, TV and bed controls. Bed alarm on for safety.

## 2016-07-01 NOTE — Consult Note (Signed)
PULMONARY / CRITICAL CARE MEDICINE   Name: Patrick [name] MRN: [id number] DOB: 1931-11-14    ADMISSION DATE:  06/30/2016   CONSULTATION DATE:  07/01/16  REFERRING MD:  Dr. Jannifer Halbert  CHIEF COMPLAINT: Acute dyspnea and hemoptysis  HISTORY OF PRESENT ILLNESS:   This is an 83 y/o Caucasian male with a PMH of Bell's palsy, systolic/diastolic HF, CKD, H0TU, GERD and atrial fibrillation who presented to the ED with complaints of worsening dyspnea,productive cough, mild hemoptysis and hypoxemia. Per EMS records, patient's SPO2 was 83% on RA. He was placed on 5L [place] with improvement. He was also noted to have some bloody sputum but no frank hemoptysis. CXR showed RML infiltrate suggestive of pneumonia. His systolic blood pressure in the ED was in the 200s He report mild improvement in dyspnea with BiPAP. Denies pain, chest pain, nausea, vomiting but reports cough and dry nares  PAST MEDICAL HISTORY :  He  has a past medical history of Amputated finger; Anemia of chronic disease; Atypical chest pain; Bell's palsy; Chronic combined systolic and diastolic CHF (congestive heart failure) ([place]); CKD (chronic kidney disease), stage III; Diabetes mellitus without complication ([place]); GERD (gastroesophageal reflux disease); History of gastroesophageal reflux (GERD); Hyperlipidemia; Hypertension; Permanent atrial fibrillation ([place]); and Pleural effusion, left.  PAST SURGICAL HISTORY: He  has a past surgical history that includes Finger surgery; Colonoscopy (09/2011); Esophagogastroduodenoscopy; and Appendectomy.  No Known Allergies  Current Facility-Administered Medications on File Prior to Encounter  Medication  . epoetin alfa (EPOGEN,PROCRIT) injection 40,000 Units   Current Outpatient Prescriptions on File Prior to Encounter  Medication Sig  . aspirin 81 MG tablet Take 81 mg by mouth daily.  . cloNIDine (CATAPRES) 0.1 MG tablet Take 1 tablet (0.1 mg total) by mouth 2 (two) times daily.  Marland Kitchen  ipratropium-albuterol (DUONEB) 0.5-2.5 (3) MG/3ML SOLN Take 3 mLs by nebulization every 6 (six) hours as needed (Shortness of breath).  Marland Kitchen omeprazole (PRILOSEC) 40 MG capsule Take 1 capsule (40 mg total) by mouth daily.  . pravastatin (PRAVACHOL) 40 MG tablet Take 80 mg by mouth at bedtime.   . Rivaroxaban (XARELTO) 15 MG TABS tablet Take 1 tablet (15 mg total) by mouth daily.  Marland Kitchen torsemide (DEMADEX) 20 MG tablet Take 1 tablet (20 mg total) by mouth every other day. (Patient taking differently: Take 20 mg by mouth as directed. Take one tablet daily EXCEPT on Wednesday and Sunday)    FAMILY HISTORY:  Family History  Problem Relation Age of Onset  . Heart attack Brother   . Diabetes Mother   . Diabetes Sister   . Hypertension      SOCIAL HISTORY: He  reports that he quit smoking about 50 years ago. His smoking use included Cigars. He has a 2.50 pack-year smoking history. He has never used smokeless tobacco. He reports that he does not drink alcohol or use drugs.  REVIEW OF SYSTEMS:   Constitutional: Negative for fever and chills.  HENT: Negative for congestion and rhinorrhea.  Eyes: Negative for redness and visual disturbance.  Respiratory: Positive for shortness of breath, productive cough but negative for wheezing.  Cardiovascular: Negative for chest pain and palpitations.  Gastrointestinal: Negative  for nausea , vomiting and abdominal pain and  Loose stools Genitourinary: Negative for dysuria and urgency.  Endocrine: Denies polyuria, polyphagia and heat intolerance Musculoskeletal: Negative for myalgias and arthralgias.  Skin: Negative for pallor and wound.  Neurological: Negative for dizziness and headaches   SUBJECTIVE:   VITAL SIGNS: BP (!) 152/89  Pulse 73   Temp 98.7 F (37.1 C) (Axillary)   Resp 20   Ht 5\' 8"  (1.727 m)   Wt 207 lb 0.2 oz (93.9 kg)   SpO2 99%   BMI 31.48 kg/m   HEMODYNAMICS:    VENTILATOR SETTINGS: FiO2 (%):  [30 %] 30 %  INTAKE /  OUTPUT: No intake/output data recorded.  PHYSICAL EXAMINATION: General:  Acutely ill-looking, moderate respiratory distress Neuro:  AAO X4, no focal deficits HEENT: PERRLA, oral mucosa moist, gag reflex intact, oral mucosa moist, trachea midline Cardiovascular:  Irregular-irregular, S1/S2, no MRG Lungs: Moderate increase in WOB, bilateral breath sounds, +rales right lung fields, no wheezing Abdomen: non-distended, normal bowel sounds Musculoskeletal: +rom, no deformities Skin:  Warm and dry  LABS:  BMET  Recent Labs Lab 06/30/16 2126 07/01/16 0054  NA 140 138  K 2.7* 2.9*  CL 95* 95*  CO2 35* 37*  BUN 26* 25*  CREATININE 2.00* 2.05*  GLUCOSE 50* 134*    Electrolytes  Recent Labs Lab 06/30/16 2126 07/01/16 0054  CALCIUM 9.0 8.6*    CBC  Recent Labs Lab 06/30/16 2126 07/01/16 0054  WBC 16.1* 12.5*  HGB 8.2* 7.4*  HCT 27.5* 24.1*  PLT 347 257    Coag's  Recent Labs Lab 06/30/16 2126  APTT 44*  INR 1.83    Sepsis Markers  Recent Labs Lab 06/30/16 2126 07/01/16 0054  LATICACIDVEN 3.0* 2.2*    ABG No results for input(s): PHART, PCO2ART, PO2ART in the last 168 hours.  Liver Enzymes No results for input(s): AST, ALT, ALKPHOS, BILITOT, ALBUMIN in the last 168 hours.  Cardiac Enzymes  Recent Labs Lab 06/30/16 2126 07/01/16 0108  TROPONINI 0.03* 0.04*    Glucose  Recent Labs Lab 06/30/16 2212 06/30/16 2225 07/01/16 0006 07/01/16 0032 07/01/16 0115 07/01/16 0232  GLUCAP 38* 126* 75 67 114* 102*    Imaging Dg Chest Portable 1 View  Result Date: 06/30/2016 CLINICAL DATA:  Shortness of breath. Oxygen dependent. Mild hemoptysis. EXAM: PORTABLE CHEST 1 VIEW COMPARISON:  04/02/2016 and 01/22/2016 FINDINGS: Lungs are adequately inflated with stable bibasilar opacification and worsening airspace opacification over the right midlung suggesting acute on chronic process and may be due to infection. Mild blunting of the costophrenic angles  unchanged. Cardiomegaly unchanged. Calcified plaque over the thoracic aorta. Degenerative change of the spine. IMPRESSION: Worsening airspace opacification over the right midlung which may represent acute infection. Chronic bibasilar opacification and chronic blunting of the costophrenic angles. Stable cardiomegaly.  Aortic atherosclerosis. Electronically Signed   By: Marin Olp M.D.   On: 06/30/2016 22:09    STUDIES:  Last echo 01/22/2016>LVEF 60-65%  CULTURES: Blood cultures Urine cultures  ANTIBIOTICS: Vancomycin 4/8> Cefepime 4/8>  SIGNIFICANT EVENTS: 4/8>ED with cough and dyspnea>admitted with CAP  LINES/TUBES: PIVs  DISCUSSION: 83 y/o WM with multiple comorbidities presenting with CAP, anemia of chronic disease, hypokalemia  And hypertensive crisis  ASSESSMENT / PLAN:  PULMONARY A: Acute hypoxic respiratory failure CAP P:   BiPAP prn and titrate to [place] as tolerated Abx as above Nebulized bronchodilator Guaifenesin and tessalon perls prn CXR and ABG prn Lactic acidosis  CARDIOVASCULAR A:  Acute on chronic systolic and diastolic congestive heart failure-Last EF 60-65% Hypertensive crisis h/o Hyperlipidemia P:  BiPAP prn Continue all home medications  RENAL A:   CKD secondary to Diabetic nephropathy-creatinine ~2 at baseline Hypokalemia P:  Trend creatinine Renally dose medications Monitor and replace electrolytes  GASTROINTESTINAL A:   H/O GERD P:   Continue  home medications NPO when on BiPAP  HEMATOLOGIC A:   Anemia of chronic disease-Hemoglobin 7.4, baseline ~9 P:  Monitor H/H and transfuse for Hg<7  INFECTIOUS A:   Sepsis 2/2 CAP P:   ABX as above Gentle fluid resuscitation given h/o CHF f/u cultures  ENDOCRINE A:   T2DM  P:   Blood glucose monitoring with sliding scale insulin coverage  NEUROLOGIC A:   h/o Bell'sPalsy P:   Continue home medications  FAMILY  - Updates: Family updated at bedside. All questions  answered  - Inter-disciplinary family meet or Palliative Care meeting due by:  day [place]. [facility] ANP-BC Pulmonary and Critical Care Medicine [facility] Pager [phone] or [phone] 07/01/2016, 2:49 AM

## 2016-07-01 NOTE — Consult Note (Signed)
Name: Patrick [name] MRN: [id number] DOB: 06/13/1931    ADMISSION DATE:  06/30/2016 CONSULTATION DATE: 07/01/16  REFERRING MD :  Ananias Pilgrim  CHIEF COMPLAINT: SOB    HISTORY OF PRESENT ILLNESS:   83 y.o. male who presents with Shortness of breath, some hemoptysis.  patient was found to have dx pneumonia with sepsis criteria He was hypoxic and placed on BiPAP.  Patient alert and awake, NAD Off of biPAP   PAST MEDICAL HISTORY :   has a past medical history of Amputated finger; Anemia of chronic disease; Atypical chest pain; Bell's palsy; Chronic combined systolic and diastolic CHF (congestive heart failure) ([place]); CKD (chronic kidney disease), stage III; Diabetes mellitus without complication ([place]); GERD (gastroesophageal reflux disease); History of gastroesophageal reflux (GERD); Hyperlipidemia; Hypertension; Permanent atrial fibrillation ([facility]); and Pleural effusion, left.  has a past surgical history that includes Finger surgery; Colonoscopy (09/2011); Esophagogastroduodenoscopy; and Appendectomy. Prior to Admission medications   Medication Sig Start Date End Date Taking? Authorizing Provider  aspirin 81 MG tablet Take 81 mg by mouth daily.   Yes Historical Provider, MD  cloNIDine (CATAPRES) 0.1 MG tablet Take 1 tablet (0.1 mg total) by mouth 2 (two) times daily. 04/15/16  Yes Alisa Graff, FNP  guaiFENesin-dextromethorphan (ROBITUSSIN DM) 100-10 MG/5ML syrup Take 5-10 mLs by mouth every 4 (four) hours as needed for cough.   Yes Historical Provider, MD  insulin NPH-regular Human (NOVOLIN 70/30) (70-30) 100 UNIT/ML injection Inject 70 Units into the skin 2 (two) times daily with a meal.   Yes Historical Provider, MD  ipratropium-albuterol (DUONEB) 0.5-2.5 (3) MG/3ML SOLN Take 3 mLs by nebulization every 6 (six) hours as needed (Shortness of breath). 04/04/16  Yes Srikar Sudini, MD  omeprazole (PRILOSEC) 40 MG capsule Take 1 capsule (40 mg total) by mouth daily. 04/15/16  Yes Alisa Graff, FNP  OXYGEN Inhale 2-4 L into the lungs continuous.   Yes Historical Provider, MD  pravastatin (PRAVACHOL) 40 MG tablet Take 80 mg by mouth at bedtime.    Yes Historical Provider, MD  predniSONE (DELTASONE) 5 MG tablet Take 5 mg by mouth daily with breakfast.   Yes Historical Provider, MD  Rivaroxaban (XARELTO) 15 MG TABS tablet Take 1 tablet (15 mg total) by mouth daily. 03/14/16  Yes Minna Merritts, MD  torsemide (DEMADEX) 20 MG tablet Take 1 tablet (20 mg total) by mouth every other day. Patient taking differently: Take 20 mg by mouth as directed. Take one tablet daily EXCEPT on Wednesday and Sunday 12/22/15  Yes Henreitta Leber, MD    REVIEW OF SYSTEMS:   Constitutional: Negative for fever, chills, weight loss, malaise/fatigue and diaphoresis.  HENT: Negative for hearing loss, ear pain, nosebleeds, congestion, sore throat, neck pain, tinnitus and ear discharge.   Eyes: Negative for blurred vision, double vision, photophobia, pain, discharge and redness.  Respiratory: Negative for cough, hemoptysis, sputum production, shortness of breath, wheezing and stridor.   Cardiovascular: Negative for chest pain, palpitations, orthopnea, claudication, leg swelling and PND.     VITAL SIGNS: Temp:  [98.1 F (36.7 C)-98.7 F (37.1 C)] 98.1 F (36.7 C) (04/09 0400) Pulse Rate:  [35-96] 35 (04/09 0800) Resp:  [13-34] 22 (04/09 0800) BP: (113-202)/(57-103) 161/64 (04/09 0800) SpO2:  [88 %-100 %] 100 % (04/09 0800) FiO2 (%):  [30 %] 30 % (04/09 0100) Weight:  [192 lb (87.1 kg)-207 lb 0.2 oz (93.9 kg)] 207 lb 0.2 oz (93.9 kg) (04/09 0440)  Physical Examination:   GENERAL:+ fatigue +  weakness HEAD: Normocephalic, atraumatic.  EYES: Pupils equal, round, reactive to light. Extraocular muscles intact. No scleral icterus.  MOUTH: Moist mucosal membrane. Dentition intact. No abscess noted.  EAR, NOSE, THROAT: Clear without exudates. No external lesions.  NECK: Supple. No thyromegaly. No  nodules. No JVD.  PULMONARY: Diffuse coarse rhonchi , poor cough reflex CARDIOVASCULAR: S1 and S2. Regular rate and rhythm. No murmurs, rubs, or gallops. No edema. Pedal pulses 2+ bilaterally.  GASTROINTESTINAL: Soft, nontender, nondistended. No masses. Positive bowel sounds. No hepatosplenomegaly.        Recent Labs Lab 06/30/16 2126 07/01/16 0054 07/01/16 0645  NA 140 138 139  K 2.7* 2.9* 3.0*  CL 95* 95* 98*  CO2 35* 37* 35*  BUN 26* 25* 23*  CREATININE 2.00* 2.05* 1.89*  GLUCOSE 50* 134* 89    Recent Labs Lab 06/30/16 2126 07/01/16 0054 07/01/16 0645  HGB 8.2* 7.4* 6.9*  HCT 27.5* 24.1* 22.3*  WBC 16.1* 12.5* 8.8  PLT 347 257 212   Dg Chest Port 1 View  Result Date: 07/01/2016 CLINICAL DATA:  Acute respiratory failure. EXAM: PORTABLE CHEST 1 VIEW COMPARISON:  One-view chest x-ray 4/8/ 18 and two-view chest x-ray 04/02/2016. FINDINGS: The heart is mildly enlarged. Atherosclerotic calcifications are present at the aortic arch. Chronic interstitial coarsening is present. Asymmetric superimposed right upper or superior segment lower lobe airspace disease is again seen. Chronic scarring is present at both bases. Pleural thickening is stable. IMPRESSION: 1. Persistent right upper or lower lobe airspace disease remains concerning for pneumonia. 2. Chronic interstitial lung disease. 3. Atherosclerosis of the aorta. Electronically Signed   By: San Morelle M.D.   On: 07/01/2016 07:18   Dg Chest Portable 1 View  Result Date: 06/30/2016 CLINICAL DATA:  Shortness of breath. Oxygen dependent. Mild hemoptysis. EXAM: PORTABLE CHEST 1 VIEW COMPARISON:  04/02/2016 and 01/22/2016 FINDINGS: Lungs are adequately inflated with stable bibasilar opacification and worsening airspace opacification over the right midlung suggesting acute on chronic process and may be due to infection. Mild blunting of the costophrenic angles unchanged. Cardiomegaly unchanged. Calcified plaque over the thoracic  aorta. Degenerative change of the spine. IMPRESSION: Worsening airspace opacification over the right midlung which may represent acute infection. Chronic bibasilar opacification and chronic blunting of the costophrenic angles. Stable cardiomegaly.  Aortic atherosclerosis. Electronically Signed   By: Marin Olp M.D.   On: 06/30/2016 22:09    ASSESSMENT / PLAN: 83 yo white male admitted for acute resp distress on biPAp for acute Pneumonia Slowly resolving  1.wean off bipap 2.oxygen as needed 3.iv abx  Will assess to  Transfer to gen med floor later today  Corrin Parker, M.D.  [facility]  Medical Director [place] Director [facility] Cardio-Pulmonary Department

## 2016-07-02 ENCOUNTER — Encounter: Payer: Self-pay | Admitting: Student

## 2016-07-02 ENCOUNTER — Inpatient Hospital Stay

## 2016-07-02 LAB — BLOOD GAS, VENOUS
ACID-BASE EXCESS: 15.3 mmol/L — AB (ref 0.0–2.0)
BICARBONATE: 41 mmol/L — AB (ref 20.0–28.0)
PH VEN: 7.45 — AB (ref 7.250–7.430)
Patient temperature: 37
pCO2, Ven: 59 mmHg (ref 44.0–60.0)

## 2016-07-02 LAB — GLUCOSE, CAPILLARY
Glucose-Capillary: 317 mg/dL — ABNORMAL HIGH (ref 65–99)
Glucose-Capillary: 306 mg/dL — ABNORMAL HIGH (ref 65–99)
Glucose-Capillary: 257 mg/dL — ABNORMAL HIGH (ref 65–99)

## 2016-07-02 LAB — BPAM RBC
Blood Product Expiration Date: 201804132359
ISSUE DATE / TIME: 201804091833
Unit Type and Rh: 9500

## 2016-07-02 LAB — CBC
HCT: 27.3 % — ABNORMAL LOW (ref 40.0–52.0)
HEMOGLOBIN: 8.5 g/dL — AB (ref 13.0–18.0)
MCH: 24 pg — AB (ref 26.0–34.0)
MCHC: 31 g/dL — ABNORMAL LOW (ref 32.0–36.0)
MCV: 77.4 fL — AB (ref 80.0–100.0)
Platelets: 226 10*3/uL (ref 150–440)
RBC: 3.52 MIL/uL — AB (ref 4.40–5.90)
RDW: 20.7 % — ABNORMAL HIGH (ref 11.5–14.5)
WBC: 7.4 10*3/uL (ref 3.8–10.6)

## 2016-07-02 LAB — TYPE AND SCREEN
ABO/RH(D): A POS
Antibody Screen: NEGATIVE
UNIT DIVISION: 0

## 2016-07-02 LAB — BASIC METABOLIC PANEL
Anion gap: 10 (ref 5–15)
BUN: 31 mg/dL — AB (ref 6–20)
CHLORIDE: 95 mmol/L — AB (ref 101–111)
CO2: 29 mmol/L (ref 22–32)
Calcium: 8.7 mg/dL — ABNORMAL LOW (ref 8.9–10.3)
Creatinine, Ser: 2.07 mg/dL — ABNORMAL HIGH (ref 0.61–1.24)
GFR calc Af Amer: 32 mL/min — ABNORMAL LOW (ref >60)
GFR calc non Af Amer: 28 mL/min — ABNORMAL LOW (ref >60)
GLUCOSE: 348 mg/dL — AB (ref 65–99)
POTASSIUM: 4.5 mmol/L (ref 3.5–5.1)
Sodium: 134 mmol/L — ABNORMAL LOW (ref 135–145)

## 2016-07-02 LAB — HEMOGLOBIN A1C
Hgb A1c MFr Bld: 6.7 % — ABNORMAL HIGH (ref 4.8–5.6)
MEAN PLASMA GLUCOSE: 146 mg/dL

## 2016-07-02 MED ORDER — FUROSEMIDE 10 MG/ML IJ SOLN
40.0000 mg | Freq: Once | INTRAMUSCULAR | Status: AC
  Administered 2016-07-02: 40 mg via INTRAVENOUS
  Filled 2016-07-02: qty 4

## 2016-07-02 MED ORDER — MOMETASONE FURO-FORMOTEROL FUM 200-5 MCG/ACT IN AERO: 2.0000 | INHALATION_SPRAY | Freq: Two times a day (BID) | RESPIRATORY_TRACT | 2 refills | Status: DC

## 2016-07-02 MED ORDER — LOSARTAN POTASSIUM 50 MG PO TABS: 50.0000 mg | ORAL_TABLET | Freq: Every day | ORAL | 2 refills | Status: DC

## 2016-07-02 MED ORDER — AMOXICILLIN-POT CLAVULANATE 875-125 MG PO TABS: 1.0000 | ORAL_TABLET | Freq: Two times a day (BID) | ORAL | 0 refills | Status: DC

## 2016-07-02 MED ORDER — INSULIN NPH ISOPHANE & REGULAR (70-30) 100 UNIT/ML ~~LOC~~ SUSP
35.0000 [IU] | Freq: Two times a day (BID) | SUBCUTANEOUS | 11 refills | Status: AC
Start: 2016-07-02 — End: ?

## 2016-07-02 MED ORDER — PREDNISONE 10 MG (21) PO TBPK: ORAL_TABLET | ORAL | 0 refills | Status: DC

## 2016-07-02 MED ORDER — INSULIN ASPART PROT & ASPART (70-30 MIX) 100 UNIT/ML ~~LOC~~ SUSP
20.0000 [IU] | Freq: Two times a day (BID) | SUBCUTANEOUS | Status: DC
  Administered 2016-07-02 (×2): 20 [IU] via SUBCUTANEOUS
  Filled 2016-07-02 (×2): qty 20

## 2016-07-02 MED ORDER — BENZONATATE 200 MG PO CAPS: 200.0000 mg | ORAL_CAPSULE | Freq: Three times a day (TID) | ORAL | 0 refills | Status: DC | PRN

## 2016-07-02 MED ORDER — MORPHINE SULFATE (CONCENTRATE) 20 MG/ML PO SOLN: 5.0000 mg | ORAL | 0 refills | Status: DC | PRN

## 2016-07-02 MED ORDER — IPRATROPIUM-ALBUTEROL 0.5-2.5 (3) MG/3ML IN SOLN: 3.0000 mL | Freq: Four times a day (QID) | RESPIRATORY_TRACT | Status: DC

## 2016-07-02 MED ORDER — TORSEMIDE 20 MG PO TABS: 20.0000 mg | ORAL_TABLET | Freq: Every day | ORAL | 1 refills | Status: DC

## 2016-07-02 MED ORDER — HYDROCOD POLST-CPM POLST ER 10-8 MG/5ML PO SUER: 5.0000 mL | Freq: Two times a day (BID) | ORAL | 0 refills | Status: DC | PRN

## 2016-07-02 NOTE — Evaluation (Signed)
Physical Therapy Evaluation Patient Details Name: Patrick [name] MRN: [id number] DOB: [birth year] Today's Date: 07/02/2016   History of Present Illness  Pt is a pleasant 83 yo male who presented to [facility] w/ increasing SOB and hemoptysis, and was admitted w/ sepsis and pneumonia, also found to be hypoxic and was placed on BiPAP (06/30/16) currently on 2 L/min via [place] (07/02/16). Note that patient is followed by hospice care. PMH includes; R finger amputation, Anemia, chest pain, Bell's Palsy, CHF, CKD III, DM, GERD, HLD, HTN, A-fib and pleural effusion on 12/2014.     Clinical Impression  Pt A&O x4 and willing to participate in PT evaluation, his wife and daughter were present for the entire session. Per patient he lives at home with his wife at baseline and normally ambulates w/o any AD for short distances of 31' and requires constant O2 use of 3L/min. His wife is home and assists the patient as needed.  During evaluation pt was found to have decrease strength of his LE's (4/5) and required min assist to perform bed mobility. Pt's O2 levels were 98% sitting at EOB on 2L/min, he was able to ambulate 100' w/ a RW and min guarding, but was limited in activity tolerance secondary to cardiopulmonary impairments listed above. He required frequent standing rest breaks and cuing for pursed lip breathing, O2 was increased to 3 L/min during ambulation as pt became SOB and O2 values remained above 90%. O2 increased back to 97% when returned to bed and pt was placed back on 2 L/min. His HR also increased to the 130's during ambulation but returned to 100's when he was returned to bed. Educated the patient on importance of using a RW as he heavily relies on it when ambulating as well as energy conservation techniques. Overall he is limited in functional mobility due to decreased activity tolerance and weakness of the LEs. He will benefit from skilled PT to improve his activity tolerance for functional tasks,  recommend he receive HHPT upon discharge from the hospital.      Follow Up Recommendations Home health PT;Supervision for mobility/OOB    Equipment Recommendations  None recommended by PT    Recommendations for Other Services       Precautions / Restrictions Precautions Precautions: Fall Restrictions Weight Bearing Restrictions: No      Mobility  Bed Mobility Overal bed mobility: Needs Assistance Bed Mobility: Supine to Sit     Supine to sit: Min assist     General bed mobility comments: pt required min assist to help w/ bringing trunk to upright seated position  Transfers Overall transfer level: Modified independent Equipment used: Rolling walker (2 wheeled)             General transfer comment: pt required increased time to transfer STS and relies on B UEs  Ambulation/Gait Ambulation/Gait assistance: Min guard Ambulation Distance (Feet): 100 Feet Assistive device: Rolling walker (2 wheeled) Gait Pattern/deviations: Step-through pattern;Decreased stride length     General Gait Details: pt ambulated 100 ft w/ decreased cadence and required cuing for safely using the RW, he looks down while ambulating and requires frequent standing rest breaks due to SOB, initially ambulated on 2L/min but increased to 3 L/min and O2 stayed above 90%, HR increased to 130s while ambulating  Stairs            Wheelchair Mobility    Modified Rankin (Stroke Patients Only)       Balance Overall balance assessment: Needs assistance Sitting-balance support: Single  extremity supported;Feet supported Sitting balance-Leahy Scale: Good Sitting balance - Comments: able to maintain upright seated posture w/o back support   Standing balance support: Bilateral upper extremity supported;During functional activity Standing balance-Leahy Scale: Fair Standing balance comment: pt leans forward w/ trunk while standing, requires RW for improved stability                              Pertinent Vitals/Pain Pain Assessment: No/denies pain    Home Living Family/patient expects to be discharged to:: Private residence Living Arrangements: Spouse/significant other Available Help at Discharge: Family;Available 24 hours/day Type of Home: House Home Access: Stairs to enter Entrance Stairs-Rails: Right Entrance Stairs-Number of Steps: 4 Home Layout: One level Home Equipment: Shower seat - built in;Walker - 2 wheels;Cane - quad;Grab bars - tub/shower;Wheelchair - manual Additional Comments: has home O2, per pt he normally stays on 3L/min     Prior Function Level of Independence: Independent with assistive device(s)         Comments: pt states he is able to ambulate 50 ft at home w/o AD but requires frequent rest breaks      Hand Dominance   Dominant Hand: Right    Extremity/Trunk Assessment   Upper Extremity Assessment Upper Extremity Assessment: RUE deficits/detail (strength appears WFLs) RUE Deficits / Details: noted to have some tremors in his R UE w/ active movement, pt states this is not present at baseline    Lower Extremity Assessment Lower Extremity Assessment: Generalized weakness (grossly 4/5 throughout B LEs)       Communication   Communication: HOH  Cognition Arousal/Alertness: Awake/alert Behavior During Therapy: WFL for tasks assessed/performed Overall Cognitive Status: Within Functional Limits for tasks assessed                                        General Comments      Exercises     Assessment/Plan    PT Assessment Patient needs continued PT services  PT Problem List Decreased strength;Decreased activity tolerance;Decreased balance;Decreased mobility;Decreased knowledge of use of DME;Cardiopulmonary status limiting activity       PT Treatment Interventions DME instruction;Gait training;Stair training;Functional mobility training;Balance training;Therapeutic exercise;Therapeutic  activities;Patient/family education    PT Goals (Current goals can be found in the Care Plan section)  Acute Rehab PT Goals Patient Stated Goal: To Return home and be able to walk further PT Goal Formulation: With patient/family Time For Goal Achievement: 07/16/16 Potential to Achieve Goals: Fair    Frequency Min 2X/week   Barriers to discharge        Co-evaluation               End of Session Equipment Utilized During Treatment: Gait belt;Oxygen Activity Tolerance: Patient limited by fatigue Patient left: in bed;with call bell/phone within reach;with family/visitor present;with bed alarm set Nurse Communication: Mobility status PT Visit Diagnosis: Unsteadiness on feet (R26.81);Muscle weakness (generalized) (M62.81)    Time: 2841-3244 PT Time Calculation (min) (ACUTE ONLY): 29 min   Charges:         PT G Codes:        [employer] Student PT 07/02/16, 10:49 AM [phone]   Wednesday [employer] 07/02/2016, 10:39 AM

## 2016-07-02 NOTE — Progress Notes (Signed)
Patient is alert and oriented and able to verbalize needs. No complaints of pain at this time. Vital signs stable. Tele discontinued. PIV discontinued. All discharge instructions gone over with patient, wife and daughter. Printed AVS and hard scripts given to patient at this time. Patient and family verbalized understanding of all follow up care. Family to transport patient home.

## 2016-07-02 NOTE — Progress Notes (Signed)
Visit made. Patient seen sitting up in the recliner alert and interactive, wife and daughter at bedside. Per conversation with attending physician Dr. Tressia Miners and family patient may discharge home today. Family able to transfer patient via car. He is at his baseline per family. Family and patient are now discussing whether or not to discontinue Eliquis or not. Writer provided support and education to patient's daughter [name], who also had questions regarding her father's code status. This conversation was outside of the the patient's room. Updated information faxed to triage. Hospice team alerted to possible discharge today. Thank you. Flo Shanks RN, BSN, [facility] [place], hospital liaison [phone] c

## 2016-07-02 NOTE — Progress Notes (Signed)
[facility] [place] NAME: Patrick [name]    MR#:  [id number]  DATE OF BIRTH:  1931-07-19  SUBJECTIVE:  CHIEF COMPLAINT:   Chief Complaint  Patient presents with  . Shortness of Breath  . Hemoptysis   -Feels much better today. Remains on 3 L oxygen which is his home oxygen. -Physical therapy consult pending  REVIEW OF SYSTEMS:  Review of Systems  Constitutional: Negative for chills, fever and malaise/fatigue.  HENT: Negative for ear discharge, hearing loss and nosebleeds.   Eyes: Negative for blurred vision and double vision.  Respiratory: Positive for cough and shortness of breath. Negative for wheezing.   Cardiovascular: Positive for leg swelling. Negative for chest pain and palpitations.  Gastrointestinal: Negative for abdominal pain, constipation, diarrhea, nausea and vomiting.  Genitourinary: Negative for dysuria.  Musculoskeletal: Negative for myalgias.  Neurological: Negative for dizziness, sensory change, speech change, focal weakness, seizures and headaches.  Psychiatric/Behavioral: Negative for depression.    DRUG ALLERGIES:  No Known Allergies  VITALS:  Blood pressure (!) 158/84, pulse 83, temperature 97.3 F (36.3 C), temperature source Oral, resp. rate 20, height 5\' 8"  (1.727 m), weight 93.9 kg (207 lb 0.2 oz), SpO2 96 %.  PHYSICAL EXAMINATION:  Physical Exam  GENERAL:  83 y.o.-year-old elderly patient lying in the bed with no acute distress.  EYES: Pupils equal, round, reactive to light and accommodation. No scleral icterus. Extraocular muscles intact.  HEENT: Head atraumatic, normocephalic. Oropharynx and nasopharynx clear.  NECK:  Supple, no jugular venous distention. No thyroid enlargement, no tenderness.  LUNGS: scant breath sounds bilaterally, bibasilar crackles,  no wheezing, rhonchi or crepitation. No use of accessory muscles of respiration.  CARDIOVASCULAR: S1, S2 normal. No rubs, or gallops. 3/6  systolic murmur present ABDOMEN: Soft, nontender, nondistended. Bowel sounds present. No organomegaly or mass.  EXTREMITIES: No cyanosis, or clubbing. 3+ pedal edema present NEUROLOGIC: Cranial nerves II through XII are intact. Muscle strength 5/5 in all extremities. Sensation intact. Gait not checked. Global weakness noted. PSYCHIATRIC: The patient is alert and oriented x 3.  SKIN: No obvious rash, lesion, or ulcer.    LABORATORY PANEL:   CBC  Recent Labs Lab 07/02/16 0334  WBC 7.4  HGB 8.5*  HCT 27.3*  PLT 226   ------------------------------------------------------------------------------------------------------------------  Chemistries   Recent Labs Lab 07/01/16 0645  07/02/16 0334  NA 139  --  134*  K 3.0*  < > 4.5  CL 98*  --  95*  CO2 35*  --  29  GLUCOSE 89  --  348*  BUN 23*  --  31*  CREATININE 1.89*  --  2.07*  CALCIUM 8.4*  --  8.7*  MG 1.8  --   --   < > = values in this interval not displayed. ------------------------------------------------------------------------------------------------------------------  Cardiac Enzymes  Recent Labs Lab 07/01/16 1254  TROPONINI 0.04*   ------------------------------------------------------------------------------------------------------------------  RADIOLOGY:  Dg Chest 2 View  Result Date: 07/02/2016 CLINICAL DATA:  Respiratory failure, pneumonia, CHF exacerbation EXAM: CHEST  2 VIEW COMPARISON:  Chest x-ray of 07/01/2016 FINDINGS: Moderate cardiomegaly is present. There is evidence of pulmonary vascular congestion with small effusions most consistent with CHF. Pneumonia particularly at the left lung base cannot be excluded. No acute bony abnormality is seen with degenerative change diffusely throughout the thoracic spine. IMPRESSION: Cardiomegaly and probable mild CHF. Difficult to exclude pneumonia at the left lung base. Recommend followup. Electronically Signed   By: Windy Canny.D.  On: 07/02/2016 09:05    Dg Chest Port 1 View  Result Date: 07/01/2016 CLINICAL DATA:  Acute respiratory failure. EXAM: PORTABLE CHEST 1 VIEW COMPARISON:  One-view chest x-ray 4/8/ 18 and two-view chest x-ray 04/02/2016. FINDINGS: The heart is mildly enlarged. Atherosclerotic calcifications are present at the aortic arch. Chronic interstitial coarsening is present. Asymmetric superimposed right upper or superior segment lower lobe airspace disease is again seen. Chronic scarring is present at both bases. Pleural thickening is stable. IMPRESSION: 1. Persistent right upper or lower lobe airspace disease remains concerning for pneumonia. 2. Chronic interstitial lung disease. 3. Atherosclerosis of the aorta. Electronically Signed   By: San Morelle M.D.   On: 07/01/2016 07:18   Dg Chest Portable 1 View  Result Date: 06/30/2016 CLINICAL DATA:  Shortness of breath. Oxygen dependent. Mild hemoptysis. EXAM: PORTABLE CHEST 1 VIEW COMPARISON:  04/02/2016 and 01/22/2016 FINDINGS: Lungs are adequately inflated with stable bibasilar opacification and worsening airspace opacification over the right midlung suggesting acute on chronic process and may be due to infection. Mild blunting of the costophrenic angles unchanged. Cardiomegaly unchanged. Calcified plaque over the thoracic aorta. Degenerative change of the spine. IMPRESSION: Worsening airspace opacification over the right midlung which may represent acute infection. Chronic bibasilar opacification and chronic blunting of the costophrenic angles. Stable cardiomegaly.  Aortic atherosclerosis. Electronically Signed   By: Marin Olp M.D.   On: 06/30/2016 22:09    EKG:   Orders placed or performed during the hospital encounter of 06/30/16  . EKG 12-Lead  . EKG 12-Lead    ASSESSMENT AND PLAN:   83 year old male with past medical history significant for CK D stage IV, atrial fibrillation on Xarelto, diabetes mellitus, systolic congestive heart failure presents to the  hospital secondary to hemoptysis and dyspneic and hypoxia.  #1 acute hypoxic respiratory failure-secondary to pneumonia and also CHF exacerbation. -On chronically 3 L home oxygen, required BiPAP and admission. Currently back on 3 L at this time. -Chest x-ray with chronic interstitial lung disease and right lower lobe pneumonia -Continue cefepime, vancomycin d/ced as MRSA pcr is negative.  #2 Acute on chronic systolic CHF-EF is 44-96%. Also severe pulmonary hypertension. -on oral torsemide. 1 dose of IV lasix  #3 anemia-anemia of chronic kidney disease. Received IV iron infusions as outpatient. -Hemoglobin was at 6.9. Received 1 unit transfusion. -Might not be a good candidate for Xarelto due to chronic anemia. Discuss with his cardiology team.  #4 CK D stage IV-baseline creatinine around 2. Stable at this time. Monitor closely.  #5 diabetes mellitus- restart novolin 70/30 today,  on sliding scale insulin.  #6 DVT prophylaxis- TEDs and SCDs  Physical therapy consulted. Patient is followed by hospice at home    All the records are reviewed and case discussed with Care Management/Social Workerr. Management plans discussed with the patient, family and they are in agreement.  CODE STATUS: Full Code  TOTAL TIME TAKING CARE OF THIS PATIENT: 36 minutes.   POSSIBLE D/C TOMORROW, DEPENDING ON CLINICAL CONDITION.   Gladstone Lighter M.D on 07/02/2016 at 9:35 AM  Between 7am to 6pm - Pager - [phone]  After 6pm go to [email or web address] - password [email or web address] [facility]  Office  [phone]  CC: Primary care physician; [place] Pink, MD

## 2016-07-02 NOTE — Progress Notes (Signed)
Inpatient Diabetes Program Recommendations  AACE/ADA: New Consensus Statement on Inpatient Glycemic Control (2015)  Target Ranges:  Prepandial:   less than 140 mg/dL      Peak postprandial:   less than 180 mg/dL (1-2 hours)      Critically ill patients:  140 - 180 mg/dL   Lab Results  Component Value Date   GLUCAP 317 (H) 07/02/2016   HGBA1C 6.7 (H) 07/01/2016    Review of Glycemic Control:  Results for NICKY, KRAS (MRN [id number]) as of 07/02/2016 09:51  Ref. Range 07/01/2016 08:09 07/01/2016 12:08 07/01/2016 17:00 07/01/2016 20:29 07/02/2016 07:41  Glucose-Capillary Latest Ref Range: 65 - 99 mg/dL 91 133 (H) 199 (H) 279 (H) 317 (H)    Diabetes history: Type 2 diabetes Outpatient Diabetes medications: 70/30 70 units bid Current orders for Inpatient glycemic control:  Novolog 70/30 20 units bid, Novolog sensitive tid with meals and HS  Inpatient Diabetes Program Recommendations:   Agree with decreased dose of 70/30 due to admission hypoglycemia.  Consider reduced dose at discharge as well.  Will follow. Thanks,  Adah Perl, RN, BC-ADM Inpatient Diabetes Coordinator Pager [phone] (8a-5p

## 2016-07-02 NOTE — Discharge Summary (Signed)
[facility] [place] NAME: Patrick [name]    MR#:  [id number]  DATE OF BIRTH:  08-13-1931  DATE OF ADMISSION:  06/30/2016   ADMITTING PHYSICIAN: Lance Coon, MD  DATE OF DISCHARGE: 07/02/2016  PRIMARY CARE PHYSICIAN: [place] Pink, MD   ADMISSION DIAGNOSIS:   Respiratory distress [R06.00] Hypoglycemia [E16.2] Hypoxia [R09.02] HCAP (healthcare-associated pneumonia) [J18.9] Sepsis, due to unspecified organism ([place]) [A41.9]  DISCHARGE DIAGNOSIS:   Principal Problem:   Sepsis ([place]) Active Problems:   Persistent atrial fibrillation ([place])   Hypertension   Diabetes mellitus ([place])   Acute on chronic combined systolic and diastolic CHF (congestive heart failure) ([place])   CKD (chronic kidney disease), stage III   HCAP (healthcare-associated pneumonia)   SECONDARY DIAGNOSIS:   Past Medical History:  Diagnosis Date  . Amputated finger   . Anemia of chronic disease    a. plan of oncology to start Procrit - received during admission 12/2013  . Atypical chest pain    a. 12/2014 Neg CE - in setting of L pleural effusion.  . Bell's palsy   . Chronic combined systolic and diastolic CHF (congestive heart failure) ([place])    a. 11/2012 Echo: EF 60-65%, mod conc LVH, mildly dil LA/RA, mild Ao sclerosis w/o stenosis; b. 11/2014 Echo: EF 40-45%, prob mid-apicalanteroseptal, ant, apical HK, Gr2 DD, mod dil LA, mildly dil RA (technically difficult study); c. echo 11/2015: EF 60-65%, trivial AI, nl RV sys fxan, PASP 59; d. echo 10/17: EF 60-65%, no RWMA, mildly dilated LA, mildly reduced RV sys fxn, PASP 70  . CKD (chronic kidney disease), stage III    a. stage III-IV  . Diabetes mellitus without complication ([place])   . GERD (gastroesophageal reflux disease)   . History of gastroesophageal reflux (GERD)   . Hyperlipidemia   . Hypertension   . Permanent atrial fibrillation ([place])    a. Dx 12/2011, Rate-controlled, chronic Xarelto (renal dosing) -  CHA2DS2VASc = 5.  . Pleural effusion, left    a. 12/2014 s/p thoracentesis - protein <3, LDH 123, no malignancy.    HOSPITAL COURSE:   83 year old male with past medical history significant for CK D stage IV, atrial fibrillation on Xarelto, diabetes mellitus, systolic congestive heart failure presents to the hospital secondary to hemoptysis and dyspneic and hypoxia.  #1 acute hypoxic respiratory failure-secondary to pneumonia and also CHF exacerbation. -On chronically 3 L home oxygen, required BiPAP and admission. Currently back on 3 L at this time. -Chest x-ray with chronic interstitial lung disease and right lower lobe pneumonia -Received cefepime, vancomycin d/ced as MRSA pcr is negative. Will be discharged on oral Augmentin. -Repeat chest x-ray with small congestion and left lower lobe pneumonia -Mild reactive airway disease noted as well. So continue prednisone taper  #2 Acute on chronic systolic CHF-EF is 78-46%. Also severe pulmonary hypertension. -Continue on oral torsemide. -Added losartan with his systolic heart failure. Not on beta blockers likely from his COPD  #3 anemia-anemia of chronic kidney disease. Received IV iron infusions as outpatient. -Hemoglobin was at 6.9. Received 1 unit transfusion. -Globin up to 8.5 prior to discharge -Might not be a good candidate for Xarelto due to chronic anemia. So discontinue Xarelto at discharge and advised patient and his family to discuss with her cardiologist if he needs to be staying away from it or needs to be started back on it. -If plans to continue treatment of his anemia, follow-up with cancer Center in 2-3 weeks.  #  4 CK D stage IV-baseline creatinine around 2. Stable at this time. Monitor closely.  #5 diabetes mellitus- restarted novolin 70/30 prior to discharge  Patient will be discharged back home with hospice services. -He ambulated with physical therapy. Close to his baseline. Family updated at bedside  DISCHARGE  CONDITIONS:   Guarded CONSULTS OBTAINED:    none  DRUG ALLERGIES:   No Known Allergies DISCHARGE MEDICATIONS:   Allergies as of 07/02/2016   No Known Allergies     Medication List    STOP taking these medications   guaiFENesin-dextromethorphan 100-10 MG/5ML syrup Commonly known as:  ROBITUSSIN DM   Rivaroxaban 15 MG Tabs tablet Commonly known as:  XARELTO     TAKE these medications   amoxicillin-clavulanate 875-125 MG tablet Commonly known as:  AUGMENTIN Take 1 tablet by mouth 2 (two) times daily. X 8 days   aspirin 81 MG tablet Take 81 mg by mouth daily.   benzonatate 200 MG capsule Commonly known as:  TESSALON Take 1 capsule (200 mg total) by mouth 3 (three) times daily as needed for cough.   chlorpheniramine-HYDROcodone 10-8 MG/5ML Suer Commonly known as:  TUSSIONEX PENNKINETIC ER Take 5 mLs by mouth every 12 (twelve) hours as needed for cough.   cloNIDine 0.1 MG tablet Commonly known as:  CATAPRES Take 1 tablet (0.1 mg total) by mouth 2 (two) times daily.   insulin NPH-regular Human (70-30) 100 UNIT/ML injection Commonly known as:  NOVOLIN 70/30 Inject 35 Units into the skin 2 (two) times daily with a meal. What changed:  how much to take   ipratropium-albuterol 0.5-2.5 (3) MG/3ML Soln Commonly known as:  DUONEB Take 3 mLs by nebulization every 6 (six) hours as needed (Shortness of breath).   losartan 50 MG tablet Commonly known as:  COZAAR Take 1 tablet (50 mg total) by mouth daily.   mometasone-formoterol 200-5 MCG/ACT Aero Commonly known as:  DULERA Inhale 2 puffs into the lungs 2 (two) times daily.   morphine 20 MG/ML concentrated solution Commonly known as:  ROXANOL Take 0.25 mLs (5 mg total) by mouth every 4 (four) hours as needed for severe pain or shortness of breath.   omeprazole 40 MG capsule Commonly known as:  PRILOSEC Take 1 capsule (40 mg total) by mouth daily.   OXYGEN Inhale 2-4 L into the lungs continuous.   pravastatin 40  MG tablet Commonly known as:  PRAVACHOL Take 80 mg by mouth at bedtime.   predniSONE 5 MG tablet Commonly known as:  DELTASONE Take 5 mg by mouth daily with breakfast. What changed:  Another medication with the same name was added. Make sure you understand how and when to take each.   predniSONE 10 MG (21) Tbpk tablet Commonly known as:  STERAPRED UNI-PAK 21 TAB 6 tabs PO x 1 day 5 tabs PO x 1 day 4 tabs PO x 1 day 3 tabs PO x 1 day 2 tabs PO x 1 day 1 tab PO x 1 day and stop What changed:  You were already taking a medication with the same name, and this prescription was added. Make sure you understand how and when to take each.   torsemide 20 MG tablet Commonly known as:  DEMADEX Take 1 tablet (20 mg total) by mouth daily. What changed:  when to take this        DISCHARGE INSTRUCTIONS:   1. PCP follow-up in 1-2 weeks 2. Cardiology follow up in 2 weeks 3. Oncology follow-up in 2-3 weeks  DIET:   Cardiac diet  ACTIVITY:   Activity as tolerated  OXYGEN:   Home Oxygen: Yes.    Oxygen Delivery: 3 liters/min via Patient connected to nasal cannula oxygen  DISCHARGE LOCATION:   home   If you experience worsening of your admission symptoms, develop shortness of breath, life threatening emergency, suicidal or homicidal thoughts you must seek medical attention immediately by calling 911 or calling your MD immediately  if symptoms less severe.  You Must read complete instructions/literature along with all the possible adverse reactions/side effects for all the Medicines you take and that have been prescribed to you. Take any new Medicines after you have completely understood and accpet all the possible adverse reactions/side effects.   Please note  You were cared for by a hospitalist during your hospital stay. If you have any questions about your discharge medications or the care you received while you were in the hospital after you are discharged, you can call the unit and  asked to speak with the hospitalist on call if the hospitalist that took care of you is not available. Once you are discharged, your primary care physician will handle any further medical issues. Please note that NO REFILLS for any discharge medications will be authorized once you are discharged, as it is imperative that you return to your primary care physician (or establish a relationship with a primary care physician if you do not have one) for your aftercare needs so that they can reassess your need for medications and monitor your lab values.    On the day of Discharge:  VITAL SIGNS:   Blood pressure (!) 162/68, pulse 88, temperature 98.7 F (37.1 C), temperature source Oral, resp. rate 20, height 5\' 8"  (1.727 m), weight 93.9 kg (207 lb 0.2 oz), SpO2 100 %.  PHYSICAL EXAMINATION:    GENERAL:  83 y.o.-year-old elderly patient lying in the bed with no acute distress.  EYES: Pupils equal, round, reactive to light and accommodation. No scleral icterus. Extraocular muscles intact.  HEENT: Head atraumatic, normocephalic. Oropharynx and nasopharynx clear.  NECK:  Supple, no jugular venous distention. No thyroid enlargement, no tenderness.  LUNGS: improved breath sounds bilaterally, bibasilar crackles,  no wheezing, rhonchi or crepitation. No use of accessory muscles of respiration.  CARDIOVASCULAR: S1, S2 normal. No rubs, or gallops. 3/6 systolic murmur present ABDOMEN: Soft, nontender, nondistended. Bowel sounds present. No organomegaly or mass.  EXTREMITIES: No cyanosis, or clubbing. 3+ pedal edema present NEUROLOGIC: Cranial nerves II through XII are intact. Muscle strength 5/5 in all extremities. Sensation intact. Gait not checked. Global weakness noted. PSYCHIATRIC: The patient is alert and oriented x 3.  SKIN: No obvious rash, lesion, or ulcer.    DATA REVIEW:   CBC  Recent Labs Lab 07/02/16 0334  WBC 7.4  HGB 8.5*  HCT 27.3*  PLT 226    Chemistries   Recent Labs Lab  07/01/16 0645  07/02/16 0334  NA 139  --  134*  K 3.0*  < > 4.5  CL 98*  --  95*  CO2 35*  --  29  GLUCOSE 89  --  348*  BUN 23*  --  31*  CREATININE 1.89*  --  2.07*  CALCIUM 8.4*  --  8.7*  MG 1.8  --   --   < > = values in this interval not displayed.   Microbiology Results  Results for orders placed or performed during the hospital encounter of 06/30/16  Culture, blood (Routine X 2)  w Reflex to ID Panel     Status: None (Preliminary result)   Collection Time: 06/30/16  9:26 PM  Result Value Ref Range Status   Specimen Description BLOOD LEFT ANTECUBITAL  Final   Special Requests   Final    BOTTLES DRAWN AEROBIC AND ANAEROBIC Blood Culture results may not be optimal due to an excessive volume of blood received in culture bottles   Culture NO GROWTH 2 DAYS  Final   Report Status PENDING  Incomplete  MRSA PCR Screening     Status: None   Collection Time: 07/01/16 12:32 AM  Result Value Ref Range Status   MRSA by PCR NEGATIVE NEGATIVE Final    Comment:        The GeneXpert MRSA Assay (FDA approved for NASAL specimens only), is one component of a comprehensive MRSA colonization surveillance program. It is not intended to diagnose MRSA infection nor to guide or monitor treatment for MRSA infections.   Culture, blood (Routine X 2) w Reflex to ID Panel     Status: None (Preliminary result)   Collection Time: 07/01/16 12:54 AM  Result Value Ref Range Status   Specimen Description BLOOD L FOA  Final   Special Requests   Final    BOTTLES DRAWN AEROBIC AND ANAEROBIC Blood Culture adequate volume   Culture NO GROWTH 1 DAY  Final   Report Status PENDING  Incomplete    RADIOLOGY:  Dg Chest 2 View  Result Date: 07/02/2016 CLINICAL DATA:  Respiratory failure, pneumonia, CHF exacerbation EXAM: CHEST  2 VIEW COMPARISON:  Chest x-ray of 07/01/2016 FINDINGS: Moderate cardiomegaly is present. There is evidence of pulmonary vascular congestion with small effusions most consistent  with CHF. Pneumonia particularly at the left lung base cannot be excluded. No acute bony abnormality is seen with degenerative change diffusely throughout the thoracic spine. IMPRESSION: Cardiomegaly and probable mild CHF. Difficult to exclude pneumonia at the left lung base. Recommend followup. Electronically Signed   By: Ivar Drape M.D.   On: 07/02/2016 09:05     Management plans discussed with the patient, family and they are in agreement.  CODE STATUS:     Code Status Orders        Start     Ordered   07/01/16 0032  Full code  Continuous     07/01/16 0031    Code Status History    Date Active Date Inactive Code Status Order ID Comments User Context   04/02/2016  4:32 PM 04/04/2016  5:52 PM Full Code [id number]  Hillary Bow, MD ED   01/24/2016  9:34 AM 01/24/2016  8:11 PM Full Code [id number]  Shary Key, RN Inpatient   01/22/2016  4:41 PM 01/24/2016  9:32 AM DNR [id number]  Demetrios Loll, MD Inpatient   12/21/2015  2:25 AM 12/21/2015  5:33 PM Full Code [id number]  Harvie Bridge, DO ED   11/20/2015 10:33 PM 11/22/2015  8:09 PM Full Code [id number]  Lance Coon, MD Inpatient   12/23/2014 11:44 AM 12/26/2014  7:40 PM Partial Code [id number]  Hillary Bow, MD Inpatient   12/23/2014  1:27 AM 12/23/2014 11:44 AM Full Code [id number]  Juluis Mire, MD Inpatient    Advance Directive Documentation     Most Recent Value  Type of Advance Directive  Healthcare Power of Attorney  Pre-existing out of facility DNR order (yellow form or pink MOST form)  -  "MOST" Form in Place?  -      [place]  OF THIS PATIENT: 37 minutes.    Leathie Weich M.D on 07/02/2016 at 4:01 PM  Between 7am to 6pm - Pager - [phone]  After 6pm go to [email or web address] - Proofreader  [facility]  Office  [phone]  CC: Primary care physician; [place] Pink, MD   Note: This dictation was prepared with Dragon dictation along with smaller phrase technology.  Any transcriptional errors that result from this process are unintentional.

## 2016-07-02 NOTE — Progress Notes (Signed)
[facility] for electrolyte management   Assessment: Pharmacy consulted for electrolyte management for 83 yo male admitted with CHF exacerbation and PNA. Patient resumed on torsemide 20mg  daily.    Plan:  Electrolytes WNL. Will f/u in 48 hours.   No Known Allergies  Patient Measurements: Height: 5\' 8"  (172.7 cm) Weight: 207 lb 0.2 oz (93.9 kg) IBW/kg (Calculated) : 68.4  Vital Signs: Temp: 97.3 F (36.3 C) (04/10 0730) Temp Source: Oral (04/10 0730) BP: 158/84 (04/10 0730) Pulse Rate: 83 (04/10 0730) Intake/Output from previous day: 04/09 0701 - 04/10 0700 In: 926 [P.O.:240; I.V.:50; Blood:636] Out: 525 [Urine:525] Intake/Output from this shift: Total I/O In: 580 [P.O.:580] Out: 200 [Urine:200]  Labs:  Recent Labs  06/30/16 2126 07/01/16 0054 07/01/16 0645 07/02/16 0334  WBC 16.1* 12.5* 8.8 7.4  HGB 8.2* 7.4* 6.9* 8.5*  HCT 27.5* 24.1* 22.3* 27.3*  PLT 347 257 212 226  APTT 44*  --   --   --   CREATININE 2.00* 2.05* 1.89* 2.07*  MG  --   --  1.8  --   PHOS  --   --  3.1  --    Estimated Creatinine Clearance: 30.1 mL/min (A) (by C-G formula based on SCr of 2.07 mg/dL (H)).  Potassium  Date Value Ref Range Status  07/02/2016 4.5 3.5 - 5.1 mmol/L Final  01/04/2014 3.5 3.5 - 5.1 mmol/L Final   Calcium  Date Value Ref Range Status  07/02/2016 8.7 (L) 8.9 - 10.3 mg/dL Final   Calcium, Total  Date Value Ref Range Status  01/04/2014 8.2 (L) 8.5 - 10.1 mg/dL Final   Albumin  Date Value Ref Range Status  12/20/2015 3.3 (L) 3.5 - 5.0 g/dL Final  01/03/2014 2.6 (L) 3.4 - 5.0 g/dL Final     Medical History: Past Medical History:  Diagnosis Date  . Amputated finger   . Anemia of chronic disease    a. plan of oncology to start Procrit - received during admission 12/2013  . Atypical chest pain    a. 12/2014 Neg CE - in setting of L pleural effusion.  . Bell's palsy   . Chronic combined systolic and diastolic CHF  (congestive heart failure) ([place])    a. 11/2012 Echo: EF 60-65%, mod conc LVH, mildly dil LA/RA, mild Ao sclerosis w/o stenosis; b. 11/2014 Echo: EF 40-45%, prob mid-apicalanteroseptal, ant, apical HK, Gr2 DD, mod dil LA, mildly dil RA (technically difficult study); c. echo 11/2015: EF 60-65%, trivial AI, nl RV sys fxan, PASP 59; d. echo 10/17: EF 60-65%, no RWMA, mildly dilated LA, mildly reduced RV sys fxn, PASP 70  . CKD (chronic kidney disease), stage III    a. stage III-IV  . Diabetes mellitus without complication ([place])   . GERD (gastroesophageal reflux disease)   . History of gastroesophageal reflux (GERD)   . Hyperlipidemia   . Hypertension   . Permanent atrial fibrillation ([place])    a. Dx 12/2011, Rate-controlled, chronic Xarelto (renal dosing) - CHA2DS2VASc = 5.  . Pleural effusion, left    a. 12/2014 s/p thoracentesis - protein <3, LDH 123, no malignancy.   Pharmacy will continue to monitor and adjust per consult.    Ulice Dash D 07/02/2016,10:40 AM

## 2016-07-03 DIAGNOSIS — N183 Chronic kidney disease, stage 3 (moderate): Secondary | ICD-10-CM | POA: Diagnosis not present

## 2016-07-03 DIAGNOSIS — J449 Chronic obstructive pulmonary disease, unspecified: Secondary | ICD-10-CM | POA: Diagnosis not present

## 2016-07-03 DIAGNOSIS — I5042 Chronic combined systolic (congestive) and diastolic (congestive) heart failure: Secondary | ICD-10-CM | POA: Diagnosis not present

## 2016-07-03 DIAGNOSIS — I130 Hypertensive heart and chronic kidney disease with heart failure and stage 1 through stage 4 chronic kidney disease, or unspecified chronic kidney disease: Secondary | ICD-10-CM | POA: Diagnosis not present

## 2016-07-03 DIAGNOSIS — E1122 Type 2 diabetes mellitus with diabetic chronic kidney disease: Secondary | ICD-10-CM | POA: Diagnosis not present

## 2016-07-03 DIAGNOSIS — J9611 Chronic respiratory failure with hypoxia: Secondary | ICD-10-CM | POA: Diagnosis not present

## 2016-07-03 LAB — BLOOD CULTURE ID PANEL (REFLEXED)
Acinetobacter baumannii: NOT DETECTED
CANDIDA GLABRATA: NOT DETECTED
CANDIDA KRUSEI: NOT DETECTED
CANDIDA TROPICALIS: NOT DETECTED
Candida albicans: NOT DETECTED
Candida parapsilosis: NOT DETECTED
ENTEROBACTER CLOACAE COMPLEX: NOT DETECTED
ESCHERICHIA COLI: NOT DETECTED
Enterobacteriaceae species: NOT DETECTED
Enterococcus species: NOT DETECTED
Haemophilus influenzae: NOT DETECTED
KLEBSIELLA PNEUMONIAE: NOT DETECTED
Klebsiella oxytoca: NOT DETECTED
Listeria monocytogenes: NOT DETECTED
Neisseria meningitidis: NOT DETECTED
PROTEUS SPECIES: NOT DETECTED
Pseudomonas aeruginosa: NOT DETECTED
SERRATIA MARCESCENS: NOT DETECTED
STAPHYLOCOCCUS SPECIES: NOT DETECTED
Staphylococcus aureus (BCID): NOT DETECTED
Streptococcus agalactiae: NOT DETECTED
Streptococcus pneumoniae: NOT DETECTED
Streptococcus pyogenes: NOT DETECTED
Streptococcus species: NOT DETECTED

## 2016-07-03 NOTE — Progress Notes (Signed)
Lab called with results of 1/4 culture bottles with Gram + rods and BCID negative.  Patient DC'd on Augmentin 875/125.

## 2016-07-04 DIAGNOSIS — N183 Chronic kidney disease, stage 3 (moderate): Secondary | ICD-10-CM | POA: Diagnosis not present

## 2016-07-04 DIAGNOSIS — E1122 Type 2 diabetes mellitus with diabetic chronic kidney disease: Secondary | ICD-10-CM | POA: Diagnosis not present

## 2016-07-04 DIAGNOSIS — J9611 Chronic respiratory failure with hypoxia: Secondary | ICD-10-CM | POA: Diagnosis not present

## 2016-07-04 DIAGNOSIS — J449 Chronic obstructive pulmonary disease, unspecified: Secondary | ICD-10-CM | POA: Diagnosis not present

## 2016-07-04 DIAGNOSIS — I5042 Chronic combined systolic (congestive) and diastolic (congestive) heart failure: Secondary | ICD-10-CM | POA: Diagnosis not present

## 2016-07-04 DIAGNOSIS — I130 Hypertensive heart and chronic kidney disease with heart failure and stage 1 through stage 4 chronic kidney disease, or unspecified chronic kidney disease: Secondary | ICD-10-CM | POA: Diagnosis not present

## 2016-07-04 LAB — CULTURE, BLOOD (ROUTINE X 2)

## 2016-07-05 DIAGNOSIS — J449 Chronic obstructive pulmonary disease, unspecified: Secondary | ICD-10-CM | POA: Diagnosis not present

## 2016-07-05 DIAGNOSIS — E1122 Type 2 diabetes mellitus with diabetic chronic kidney disease: Secondary | ICD-10-CM | POA: Diagnosis not present

## 2016-07-05 DIAGNOSIS — I5042 Chronic combined systolic (congestive) and diastolic (congestive) heart failure: Secondary | ICD-10-CM | POA: Diagnosis not present

## 2016-07-05 DIAGNOSIS — J9611 Chronic respiratory failure with hypoxia: Secondary | ICD-10-CM | POA: Diagnosis not present

## 2016-07-05 DIAGNOSIS — I130 Hypertensive heart and chronic kidney disease with heart failure and stage 1 through stage 4 chronic kidney disease, or unspecified chronic kidney disease: Secondary | ICD-10-CM | POA: Diagnosis not present

## 2016-07-05 DIAGNOSIS — N183 Chronic kidney disease, stage 3 (moderate): Secondary | ICD-10-CM | POA: Diagnosis not present

## 2016-07-06 DIAGNOSIS — N183 Chronic kidney disease, stage 3 (moderate): Secondary | ICD-10-CM | POA: Diagnosis not present

## 2016-07-06 DIAGNOSIS — I5042 Chronic combined systolic (congestive) and diastolic (congestive) heart failure: Secondary | ICD-10-CM | POA: Diagnosis not present

## 2016-07-06 DIAGNOSIS — E1122 Type 2 diabetes mellitus with diabetic chronic kidney disease: Secondary | ICD-10-CM | POA: Diagnosis not present

## 2016-07-06 DIAGNOSIS — I130 Hypertensive heart and chronic kidney disease with heart failure and stage 1 through stage 4 chronic kidney disease, or unspecified chronic kidney disease: Secondary | ICD-10-CM | POA: Diagnosis not present

## 2016-07-06 DIAGNOSIS — J9611 Chronic respiratory failure with hypoxia: Secondary | ICD-10-CM | POA: Diagnosis not present

## 2016-07-06 DIAGNOSIS — J449 Chronic obstructive pulmonary disease, unspecified: Secondary | ICD-10-CM | POA: Diagnosis not present

## 2016-07-06 LAB — CULTURE, BLOOD (ROUTINE X 2)
Culture: NO GROWTH
Special Requests: ADEQUATE

## 2016-07-09 DIAGNOSIS — I130 Hypertensive heart and chronic kidney disease with heart failure and stage 1 through stage 4 chronic kidney disease, or unspecified chronic kidney disease: Secondary | ICD-10-CM | POA: Diagnosis not present

## 2016-07-09 DIAGNOSIS — J9611 Chronic respiratory failure with hypoxia: Secondary | ICD-10-CM | POA: Diagnosis not present

## 2016-07-09 DIAGNOSIS — J449 Chronic obstructive pulmonary disease, unspecified: Secondary | ICD-10-CM | POA: Diagnosis not present

## 2016-07-09 DIAGNOSIS — I5042 Chronic combined systolic (congestive) and diastolic (congestive) heart failure: Secondary | ICD-10-CM | POA: Diagnosis not present

## 2016-07-09 DIAGNOSIS — N183 Chronic kidney disease, stage 3 (moderate): Secondary | ICD-10-CM | POA: Diagnosis not present

## 2016-07-09 DIAGNOSIS — E1122 Type 2 diabetes mellitus with diabetic chronic kidney disease: Secondary | ICD-10-CM | POA: Diagnosis not present

## 2016-07-11 ENCOUNTER — Encounter: Payer: Medicare HMO | Admitting: Cardiovascular Disease

## 2016-07-11 ENCOUNTER — Other Ambulatory Visit: Payer: Self-pay

## 2016-07-11 DIAGNOSIS — N183 Chronic kidney disease, stage 3 (moderate): Secondary | ICD-10-CM | POA: Diagnosis not present

## 2016-07-11 DIAGNOSIS — J449 Chronic obstructive pulmonary disease, unspecified: Secondary | ICD-10-CM | POA: Diagnosis not present

## 2016-07-11 DIAGNOSIS — E1122 Type 2 diabetes mellitus with diabetic chronic kidney disease: Secondary | ICD-10-CM | POA: Diagnosis not present

## 2016-07-11 DIAGNOSIS — I5042 Chronic combined systolic (congestive) and diastolic (congestive) heart failure: Secondary | ICD-10-CM | POA: Diagnosis not present

## 2016-07-11 DIAGNOSIS — J9611 Chronic respiratory failure with hypoxia: Secondary | ICD-10-CM | POA: Diagnosis not present

## 2016-07-11 DIAGNOSIS — I130 Hypertensive heart and chronic kidney disease with heart failure and stage 1 through stage 4 chronic kidney disease, or unspecified chronic kidney disease: Secondary | ICD-10-CM | POA: Diagnosis not present

## 2016-07-11 NOTE — Patient Outreach (Signed)
[facility]) Care Management  07/11/2016  Patrick [name] [birth year] [id number]  EMMI: General discharge Referral date: 07/11/16 Referral source: EMMI general discharge red alert Referral reason:  Know who to call about changes: NO, Scheduled follow up: NO Day #4 Outreach attempt #1   Telephone call to patient regarding EMMI general discharge red alert. Unable to reach patient or leave voice message. Phone only rang.  PLAN: RNCM will attempt 2nd telephone outreach within 3 business days.   Quinn Plowman RN,BSN,CCM [facility] Telephonic  [phone]

## 2016-07-12 ENCOUNTER — Other Ambulatory Visit: Payer: Self-pay

## 2016-07-12 NOTE — Patient Outreach (Addendum)
[facility]) Care Management  07/12/2016  Patrick [name] 1931-10-17 [id number]   EMMI: General discharge Referral date: 07/11/16 Referral source: EMMI general discharge red alert Referral reason:  Know who to call about changes: NO, Scheduled follow up: NO Day #4 Outreach attempt #1   Telephone call to patient for EMMI general discharge follow up.  HIPAA verified with patient. Discussed EMMI general discharge program with patient. Patient states he has an appointment with a doctor on Thursday.  Patient reports he is not sure. Patient states his wife takes care of all of his medical information and appointments. RNCM inquired if patient had any in home services. Patient states he thinks he has hospice. Patient states his wife is at work currently . Request call back on another day to speak with his wife. Patient gave verbal authorization to speak with his wife, Patrick [name] regarding all of his personal health information.   PLAN; RNCM will attempt contact with patients wife as requested within 3 business days.   Quinn Plowman RN,BSN,CCM [facility] Telephonic  [phone]

## 2016-07-15 ENCOUNTER — Ambulatory Visit: Payer: Self-pay

## 2016-07-15 DIAGNOSIS — I129 Hypertensive chronic kidney disease with stage 1 through stage 4 chronic kidney disease, or unspecified chronic kidney disease: Secondary | ICD-10-CM | POA: Diagnosis not present

## 2016-07-15 DIAGNOSIS — E1122 Type 2 diabetes mellitus with diabetic chronic kidney disease: Secondary | ICD-10-CM | POA: Diagnosis not present

## 2016-07-15 DIAGNOSIS — R809 Proteinuria, unspecified: Secondary | ICD-10-CM | POA: Diagnosis not present

## 2016-07-15 DIAGNOSIS — N2581 Secondary hyperparathyroidism of renal origin: Secondary | ICD-10-CM | POA: Diagnosis not present

## 2016-07-15 DIAGNOSIS — N183 Chronic kidney disease, stage 3 (moderate): Secondary | ICD-10-CM | POA: Diagnosis not present

## 2016-07-16 ENCOUNTER — Other Ambulatory Visit: Payer: Self-pay

## 2016-07-16 DIAGNOSIS — E1122 Type 2 diabetes mellitus with diabetic chronic kidney disease: Secondary | ICD-10-CM | POA: Diagnosis not present

## 2016-07-16 DIAGNOSIS — J449 Chronic obstructive pulmonary disease, unspecified: Secondary | ICD-10-CM | POA: Diagnosis not present

## 2016-07-16 DIAGNOSIS — J9611 Chronic respiratory failure with hypoxia: Secondary | ICD-10-CM | POA: Diagnosis not present

## 2016-07-16 DIAGNOSIS — I130 Hypertensive heart and chronic kidney disease with heart failure and stage 1 through stage 4 chronic kidney disease, or unspecified chronic kidney disease: Secondary | ICD-10-CM | POA: Diagnosis not present

## 2016-07-16 DIAGNOSIS — I5042 Chronic combined systolic (congestive) and diastolic (congestive) heart failure: Secondary | ICD-10-CM | POA: Diagnosis not present

## 2016-07-16 DIAGNOSIS — N183 Chronic kidney disease, stage 3 (moderate): Secondary | ICD-10-CM | POA: Diagnosis not present

## 2016-07-16 NOTE — Patient Outreach (Signed)
[facility]) Care Management  07/16/2016  MALIC ROSTEN [birth year] [id number]  EMMI: General discharge Referral date: 07/11/16 Referral source: EMMI general discharge red alert Referral reason: Know who to call about changes: NO, Scheduled follow up: NO Day #4 Patient gave verbal consent to speak with his wife, Jermichael Belmares. RNCM explained EMMI general discharge program. Wife states patient has [facility] working with him at this time. No further need for services. Fa  PLAN; RNCM will refer patient to care management assistant to close due to patient being enrolled in an external program. RNCM will notify patients primary MD of closure.  Quinn Plowman RN,BSN,CCM [facility] Telephonic  [phone]

## 2016-07-17 NOTE — Progress Notes (Signed)
Cardiology Office Note  Date:  07/18/2016   ID:  Patrick [name], Patrick [name] 03-02-1932, MRN [id number]  PCP:  [place] Pink, MD   Chief Complaint  Patient presents with  . other     [facility] hosp f/u 4/8, not tcm. Pt c/o sob and sore neck, bilateral leg swelling; denies cp.  Reviewed meds with pt verbally.    HPI:  Patrick [name] is a very pleasant 83 year old gentleman with history of  hypertension,  COPD,  emphysema diabetes,  hyperlipidemia,  GERD  Chronic atrial fibrillation Acute on chronic diastolic CHF, on torsemide  presented to the hospital December 30 2011  in atrial fibrillation with RVR,  started on rate controlling medications and discharged on Cardizem 300 mg daily with anticoagulation (xarelto).   lower extremity edema with Cardizem  Previous diagnosis of Bell's palsy  Hospital admission 01/03/2014 with shortness of breath, diastolic CHF, pleural effusions, anemia, responding well to diuresis  He presents for routine followup of his atrial fibrillation  Admission to the hospital 06/30/2016 for sepsis, pneumonia Hospital records reviewed Creatinine 2.0  also with anemia,  Received one unit packed cells  In the hospital  Iron level is low.  Currently  Baseline weight 190 pounds Recent weight 207 pounds Worsening lower extremity edema, abdominal bloating, shortness of breath  Hemoglobin April 10 of 8.5, up from 6.9 after blood  Currently taking torsemide 40 mg daily  EKG personally reviewed by myself on todays visit Shows atrial fibrillation with ventricular rate 84 bpm no significant ST or T-wave changes    other past medical history reviewed  admitted in September 2017 with acute on chronic diastolic CHF.  Echo at that time showed normal EF with PASP 59 mmHg.  He was diuresed and sent home on torsemide every other day.   On 01/22/16 he developed sudden onset of SOB and presented to [facility] where he was found to have acute on chronic respiratory failure felt to  be multifactorial including some component of CHF exacerbation and worsening anemia with a 3 gram drop from 11.3 during prior admission to 8.6   echo showed normal LV systolic function with an EF of 60-65%, nmo RWMA, mildly dilated left atrium, mildly increased RV wall thickness with mild reduction in systolic function, PASP increased at 70 mmHg.  CT chest showed underlying emphysema, pulmonary nodule measuring 1.8 x1.1 cm extensive atherosclerotic calcification, ascending aorta measuring 4.1x37 cm   he  is taking torsemide 20 mg prn weight gain  Recent baseline weight 189 pounds up to 191 pounds  EKG on today's visit shows atrial fibrillation with ventricular rate 68 bpm, no significant ST or T-wave changes   other past medical history  Seen by CHF clinic on 01/12/2014. Note was reviewed. No new medication changes at that time  presented to [facility] on 10/12 with increased DOE. pBNP was found to be 2708. CXR showed mild CHF with bilateral pleural effusions. He was diuresed with IV Lasix 40 mg BID with good response. He does have a narrow euvolemic window 2/2 his CKD. He was restarted back on his home dose of torsemide 20 mg bid with double dose prn weight gain. H/H has been drifting down over the past year, has been seen by Oncology in the past with a plan for Procrit once hgb <10. Procrit and iron were given this past admission.    Previously not interested in cardioversion  He has been tolerating anticoagulation at renal dosing.  CT scan of the head in the hospital  showed no intracranial process Lab work October 2013, creatinine 2.2, BUN 34 Lab work August 2013 , Hemoglobin A1c 8.1 Total cholesterol 97, LDL 48 creatinine 2.2, BUN in the 40s which is higher than his baseline BUN in the 20s. Wife reports he does not drink very much  Echocardiogram showed ejection fraction greater than 55%, mildly dilated left atrium, otherwise normal study MRI of the brain showed chronic changes without  evidence of acute abnormalities   PMH:   has a past medical history of Amputated finger; Anemia of chronic disease; Atypical chest pain; Bell's palsy; Chronic combined systolic and diastolic CHF (congestive heart failure) ([place]); CKD (chronic kidney disease), stage III; Diabetes mellitus without complication ([place]); GERD (gastroesophageal reflux disease); History of gastroesophageal reflux (GERD); Hyperlipidemia; Hypertension; Permanent atrial fibrillation ([place]); and Pleural effusion, left.  PSH:    Past Surgical History:  Procedure Laterality Date  . APPENDECTOMY    . COLONOSCOPY  09/2011  . ESOPHAGOGASTRODUODENOSCOPY    . FINGER SURGERY     right hand    Current Outpatient Prescriptions  Medication Sig Dispense Refill  . amoxicillin-clavulanate (AUGMENTIN) 875-125 MG tablet Take 1 tablet by mouth 2 (two) times daily. X 8 days 16 tablet 0  . aspirin 81 MG tablet Take 81 mg by mouth daily.    . benzonatate (TESSALON) 200 MG capsule Take 1 capsule (200 mg total) by mouth 3 (three) times daily as needed for cough. 20 capsule 0  . chlorpheniramine-HYDROcodone (TUSSIONEX PENNKINETIC ER) 10-8 MG/5ML SUER Take 5 mLs by mouth every 12 (twelve) hours as needed for cough. 115 mL 0  . cloNIDine (CATAPRES) 0.1 MG tablet Take 1 tablet (0.1 mg total) by mouth 2 (two) times daily. 180 tablet 3  . insulin NPH-regular Human (NOVOLIN 70/30) (70-30) 100 UNIT/ML injection Inject 35 Units into the skin 2 (two) times daily with a meal. 10 mL 11  . ipratropium-albuterol (DUONEB) 0.5-2.5 (3) MG/3ML SOLN Take 3 mLs by nebulization every 6 (six) hours as needed (Shortness of breath). 360 mL 0  . losartan (COZAAR) 50 MG tablet Take 1 tablet (50 mg total) by mouth daily. 30 tablet 2  . mometasone-formoterol (DULERA) 200-5 MCG/ACT AERO Inhale 2 puffs into the lungs 2 (two) times daily. 1 Inhaler 2  . morphine (ROXANOL) 20 MG/ML concentrated solution Take 0.25 mLs (5 mg total) by mouth every 4 (four) hours as needed for  severe pain or shortness of breath. 30 mL 0  . omeprazole (PRILOSEC) 40 MG capsule Take 1 capsule (40 mg total) by mouth daily. 90 capsule 3  . OXYGEN Inhale 2-4 L into the lungs continuous.    . pravastatin (PRAVACHOL) 40 MG tablet Take 80 mg by mouth at bedtime.     . predniSONE (DELTASONE) 5 MG tablet Take 5 mg by mouth daily with breakfast.    . predniSONE (STERAPRED UNI-PAK 21 TAB) 10 MG (21) TBPK tablet 6 tabs PO x 1 day 5 tabs PO x 1 day 4 tabs PO x 1 day 3 tabs PO x 1 day 2 tabs PO x 1 day 1 tab PO x 1 day and stop 21 tablet 0  . torsemide (DEMADEX) 20 MG tablet Take 1 tablet (20 mg total) by mouth daily. (Patient taking differently: Take 20 mg by mouth 2 (two) times daily. ) 60 tablet 1   No current facility-administered medications for this visit.    Facility-Administered Medications Ordered in Other Visits  Medication Dose Route Frequency Provider Last Rate Last Dose  .  epoetin alfa (EPOGEN,PROCRIT) injection 40,000 Units  40,000 Units Subcutaneous Once Lloyd Huger, MD         Allergies:   Patient has no known allergies.   Social History:  The patient  reports that he quit smoking about 50 years ago. His smoking use included Cigars. He has a 2.50 pack-year smoking history. He has never used smokeless tobacco. He reports that he does not drink alcohol or use drugs.   Family History:   family history includes Diabetes in his mother and sister; Heart attack in his brother.    Review of Systems: Review of Systems  Constitutional: Positive for malaise/fatigue.       Weight gain  Respiratory: Positive for shortness of breath.   Cardiovascular: Positive for leg swelling.  Gastrointestinal: Negative.   Musculoskeletal: Negative.   Neurological: Negative.   Psychiatric/Behavioral: Negative.   All other systems reviewed and are negative.    PHYSICAL EXAM: VS:  BP (!) 148/96 (BP Location: Right Arm, Patient Position: Sitting, Cuff Size: Normal)   Pulse 84   Ht 5'  8" (1.727 m)   Wt 202 lb 8 oz (91.9 kg)   BMI 30.79 kg/m  , BMI Body mass index is 30.79 kg/m. GEN: Well nourished, well developed, in no acute distress , obese, on nasal cannula oxygen HEENT: normal  Neck: no JVD, carotid bruits, or masses Cardiac: Irregularly irregular no murmurs, rubs, or gallops,1 -2 + pitting edema to below the knees bilaterally  edema  Respiratory:  clear to auscultation bilaterally, normal work of breathing GI: soft, nontender, nondistended, + BS MS: no deformity or atrophy  Skin: warm and dry, no rash Neuro:  Strength and sensation are intact Psych: euthymic mood, full affect    Recent Labs: 12/20/2015: ALT 15 12/21/2015: TSH 0.859 06/30/2016: B Natriuretic Peptide 780.0 07/01/2016: Magnesium 1.8 07/02/2016: BUN 31; Creatinine, Ser 2.07; Hemoglobin 8.5; Platelets 226; Potassium 4.5; Sodium 134    Lipid Panel Lab Results  Component Value Date   CHOL 81 01/03/2014   HDL 16 (L) 01/03/2014   LDLCALC 43 01/03/2014   TRIG 111 01/03/2014      Wt Readings from Last 3 Encounters:  07/18/16 202 lb 8 oz (91.9 kg)  07/01/16 207 lb 0.2 oz (93.9 kg)  06/24/16 201 lb (91.2 kg)       ASSESSMENT AND PLAN:  Persistent atrial fibrillation ([place]) - Plan: EKG 12-Lead Rate well controlled, tolerating anticoagulation  Chronic diastolic heart failure ([place]) Recommended he increase torsemide up to 40 mg twice a day for weight more than 198 pounds below 198 pounds would decrease torsemide down to 40 mg daily  Essential hypertension Blood pressure stable, no medication changes made  Mixed hyperlipidemia Encouraged him to continue on pravastatin  Type 2 diabetes mellitus with other circulatory complication, with long-term current use of insulin ([place]) We have encouraged continued exercise, careful diet management in an effort to lose weight.  Anemia of chronic disease Anemia likely contributing to his CHF symptoms  Iron deficiency anemia, recently had iron  infusion Current Level will affect his breathing, energy and risk for recurrent CHF Recommended he start oral iron and will send a message to hematology to request iron infusion  CKD (chronic kidney disease), stage III Stable renal function   Lymphadenopathy Previously seen by Dr. Grayland Ormond. CLL  He may benefit from iron infusion   Long discussion of above with patient's  wife in the room today  Total encounter time more than 45 minutes  Greater  than 50% was spent in counseling and coordination of care with the patient   Disposition:   F/U  1 months   No orders of the defined types were placed in this encounter.    Signed, Esmond Plants, M.D., Ph.D. 07/18/2016  [place], [place]

## 2016-07-18 ENCOUNTER — Ambulatory Visit (INDEPENDENT_AMBULATORY_CARE_PROVIDER_SITE_OTHER): Admitting: Cardiovascular Disease

## 2016-07-18 ENCOUNTER — Encounter: Payer: Self-pay | Admitting: Cardiovascular Disease

## 2016-07-18 VITALS — BP 148/96 | HR 84 | Ht 68.0 in | Wt 202.5 lb

## 2016-07-18 DIAGNOSIS — N189 Chronic kidney disease, unspecified: Secondary | ICD-10-CM

## 2016-07-18 DIAGNOSIS — N179 Acute kidney failure, unspecified: Secondary | ICD-10-CM

## 2016-07-18 DIAGNOSIS — J189 Pneumonia, unspecified organism: Secondary | ICD-10-CM | POA: Diagnosis not present

## 2016-07-18 DIAGNOSIS — E782 Mixed hyperlipidemia: Secondary | ICD-10-CM

## 2016-07-18 DIAGNOSIS — Z794 Long term (current) use of insulin: Secondary | ICD-10-CM | POA: Diagnosis not present

## 2016-07-18 DIAGNOSIS — E1122 Type 2 diabetes mellitus with diabetic chronic kidney disease: Secondary | ICD-10-CM | POA: Diagnosis not present

## 2016-07-18 DIAGNOSIS — I5042 Chronic combined systolic (congestive) and diastolic (congestive) heart failure: Secondary | ICD-10-CM | POA: Diagnosis not present

## 2016-07-18 DIAGNOSIS — I130 Hypertensive heart and chronic kidney disease with heart failure and stage 1 through stage 4 chronic kidney disease, or unspecified chronic kidney disease: Secondary | ICD-10-CM | POA: Diagnosis not present

## 2016-07-18 DIAGNOSIS — I10 Essential (primary) hypertension: Secondary | ICD-10-CM | POA: Diagnosis not present

## 2016-07-18 DIAGNOSIS — I5043 Acute on chronic combined systolic (congestive) and diastolic (congestive) heart failure: Secondary | ICD-10-CM | POA: Diagnosis not present

## 2016-07-18 DIAGNOSIS — D509 Iron deficiency anemia, unspecified: Secondary | ICD-10-CM

## 2016-07-18 DIAGNOSIS — E1159 Type 2 diabetes mellitus with other circulatory complications: Secondary | ICD-10-CM | POA: Diagnosis not present

## 2016-07-18 DIAGNOSIS — I481 Persistent atrial fibrillation: Secondary | ICD-10-CM

## 2016-07-18 DIAGNOSIS — J449 Chronic obstructive pulmonary disease, unspecified: Secondary | ICD-10-CM | POA: Diagnosis not present

## 2016-07-18 DIAGNOSIS — I4819 Other persistent atrial fibrillation: Secondary | ICD-10-CM

## 2016-07-18 DIAGNOSIS — N183 Chronic kidney disease, stage 3 (moderate): Secondary | ICD-10-CM | POA: Diagnosis not present

## 2016-07-18 DIAGNOSIS — J9611 Chronic respiratory failure with hypoxia: Secondary | ICD-10-CM | POA: Diagnosis not present

## 2016-07-18 MED ORDER — TORSEMIDE 20 MG PO TABS: 40.0000 mg | ORAL_TABLET | Freq: Two times a day (BID) | ORAL | 3 refills | Status: DC | PRN

## 2016-07-18 MED ORDER — POTASSIUM CHLORIDE ER 10 MEQ PO TBCR: 10.0000 meq | EXTENDED_RELEASE_TABLET | Freq: Two times a day (BID) | ORAL | 3 refills | Status: DC

## 2016-07-18 NOTE — Patient Instructions (Addendum)
Medication Instructions:   Two iron pills a day (ferrous sulfate)  For weight >198 Please take torsemide 2 in the AM and 2 in the PM  Take with 2 potassium a day  For weight >193  Take torsemide 2 in the AM Take with one potassium a day  Compression hose in the day  Below 193, go back to 1 torsemide a day With one potassium every other day  Labwork:  No new labs needed  Testing/Procedures:  No further testing at this time   I recommend watching educational videos on topics of interest to you at:       [email or web address].[email or web address]  Enter code: [email or web address]    Follow-Up: It was a pleasure seeing you in the office today. Please call us if you have new issues that need to be addressed before your next appt.  [phone]  Your physician wants you to follow-up in: 4 weeks   If you need a refill on your cardiac medications before your next appointment, please call your pharmacy.

## 2016-07-19 ENCOUNTER — Ambulatory Visit: Payer: Medicare HMO | Admitting: Pulmonary Disease

## 2016-07-21 NOTE — Progress Notes (Signed)
[place]  [phone]) [phone] [phone]) [phone]  ID: Patrick [name] OB: Aug 23, 1931  MR#: [id number]  WSF#:[id number]  Patient Care Team: [place] Pink, MD as PCP - General (Family Medicine) Alisa Graff, FNP as Nurse Practitioner (Family Medicine) Rise Mu, PA-C as Physician Assistant (Cardiology) Lloyd Huger, MD as Consulting Physician (Hematology) Lavonia Dana, MD as Consulting Physician (Nephrology) Judi Cong, MD as Physician Assistant (Endocrinology)  CHIEF COMPLAINT:  Iron deficiency anemia, anemia secondary to chronic renal failure.  INTERVAL HISTORY: Patient returns to clinic today for further evaluation, laboratory work, and consideration of continuation of Feraheme or Procrit. He has had recent hospital admission for worsening CHF. He continues to have increased weakness and fatigue. He also continues to have chronic shortness of breath.  He has no neurologic complaints. He denies any weight loss. He denies any recent fevers. He denies any chest pain. He has no nausea, vomiting, constipation, or diarrhea. He has no urinary complaints. Patient offers no further specific complaints.   REVIEW OF SYSTEMS:   Review of Systems  Constitutional: Positive for malaise/fatigue. Negative for fever and weight loss.  Respiratory: Positive for cough.   Cardiovascular: Negative.  Negative for chest pain and leg swelling.  Gastrointestinal: Negative.  Negative for abdominal pain.  Genitourinary: Negative.   Musculoskeletal: Negative.   Neurological: Positive for weakness.  Psychiatric/Behavioral: Negative.  The patient is not nervous/anxious.     As per HPI. Otherwise, a complete review of systems is negative.  PAST MEDICAL HISTORY: Past Medical History:  Diagnosis Date  . Amputated finger   . Anemia of chronic disease    a. plan of oncology to start Procrit - received during admission 12/2013  . Atypical chest pain    a. 12/2014 Neg  CE - in setting of L pleural effusion.  . Bell's palsy   . Chronic combined systolic and diastolic CHF (congestive heart failure) ([place])    a. 11/2012 Echo: EF 60-65%, mod conc LVH, mildly dil LA/RA, mild Ao sclerosis w/o stenosis; b. 11/2014 Echo: EF 40-45%, prob mid-apicalanteroseptal, ant, apical HK, Gr2 DD, mod dil LA, mildly dil RA (technically difficult study); c. echo 11/2015: EF 60-65%, trivial AI, nl RV sys fxan, PASP 59; d. echo 10/17: EF 60-65%, no RWMA, mildly dilated LA, mildly reduced RV sys fxn, PASP 70  . CKD (chronic kidney disease), stage III    a. stage III-IV  . Diabetes mellitus without complication ([place])   . GERD (gastroesophageal reflux disease)   . History of gastroesophageal reflux (GERD)   . Hyperlipidemia   . Hypertension   . Permanent atrial fibrillation ([place])    a. Dx 12/2011, Rate-controlled, chronic Xarelto (renal dosing) - CHA2DS2VASc = 5.  . Pleural effusion, left    a. 12/2014 s/p thoracentesis - protein <3, LDH 123, no malignancy.    PAST SURGICAL HISTORY: Past Surgical History:  Procedure Laterality Date  . APPENDECTOMY    . COLONOSCOPY  09/2011  . ESOPHAGOGASTRODUODENOSCOPY    . FINGER SURGERY     right hand    FAMILY HISTORY Family History  Problem Relation Age of Onset  . Heart attack Brother   . Diabetes Mother   . Diabetes Sister   . Hypertension         ADVANCED DIRECTIVES:    HEALTH MAINTENANCE: Social History  Substance Use Topics  . Smoking status: Former Smoker    Packs/day: 0.25    Years: 10.00    Types: Cigars  Quit date: 05/06/1966  . Smokeless tobacco: Never Used  . Alcohol use No    No Known Allergies  Current Outpatient Prescriptions  Medication Sig Dispense Refill  . aspirin 81 MG tablet Take 81 mg by mouth daily.    . benzonatate (TESSALON) 200 MG capsule Take 1 capsule (200 mg total) by mouth 3 (three) times daily as needed for cough. 20 capsule 0  . cloNIDine (CATAPRES) 0.1 MG tablet Take 1 tablet (0.1 mg  total) by mouth 2 (two) times daily. 180 tablet 3  . docusate sodium (COLACE) 100 MG capsule Take 100 mg by mouth daily.    . insulin NPH-regular Human (NOVOLIN 70/30) (70-30) 100 UNIT/ML injection Inject 35 Units into the skin 2 (two) times daily with a meal. 10 mL 11  . losartan (COZAAR) 50 MG tablet Take 1 tablet (50 mg total) by mouth daily. 30 tablet 2  . morphine (ROXANOL) 20 MG/ML concentrated solution Take 0.25 mLs (5 mg total) by mouth every 4 (four) hours as needed for severe pain or shortness of breath. 30 mL 0  . omeprazole (PRILOSEC) 40 MG capsule Take 1 capsule (40 mg total) by mouth daily. 90 capsule 3  . OXYGEN Inhale 2-4 L into the lungs continuous.    . potassium chloride (K-DUR) 10 MEQ tablet Take 1 tablet (10 mEq total) by mouth 2 (two) times daily. 180 tablet 3  . pravastatin (PRAVACHOL) 40 MG tablet Take 40 mg by mouth at bedtime.     . predniSONE (DELTASONE) 5 MG tablet Take 10 mg by mouth daily with breakfast.     . torsemide (DEMADEX) 20 MG tablet Take 2 tablets (40 mg total) by mouth 2 (two) times daily as needed. 120 tablet 3   No current facility-administered medications for this visit.    Facility-Administered Medications Ordered in Other Visits  Medication Dose Route Frequency Provider Last Rate Last Dose  . epoetin alfa (EPOGEN,PROCRIT) injection 40,000 Units  40,000 Units Subcutaneous Once Lloyd Huger, MD        OBJECTIVE: Vitals:   07/22/16 1437  BP: (!) 163/83  Pulse: 97  Resp: 18  Temp: 98.6 F (37 C)     Body mass index is 31.31 kg/m.    ECOG FS:1 - Symptomatic but completely ambulatory  General: Well-developed, well-nourished, no acute distress. Eyes: anicteric sclera. Lungs: Clear to auscultation bilaterally. Heart: Regular rate and rhythm. No rubs, murmurs, or gallops. Abdomen: Soft, nontender, nondistended. No organomegaly noted, normoactive bowel sounds. Musculoskeletal: No edema, cyanosis, or clubbing. Neuro: Alert, answering all  questions appropriately. Cranial nerves grossly intact. Skin: No rashes or petechiae noted. Psych: Normal affect.   LAB RESULTS:  Lab Results  Component Value Date   NA 134 (L) 07/02/2016   K 4.5 07/02/2016   CL 95 (L) 07/02/2016   CO2 29 07/02/2016   GLUCOSE 348 (H) 07/02/2016   BUN 31 (H) 07/02/2016   CREATININE 2.07 (H) 07/02/2016   CALCIUM 8.7 (L) 07/02/2016   PROT 7.3 12/20/2015   ALBUMIN 3.3 (L) 12/20/2015   AST 26 12/20/2015   ALT 15 (L) 12/20/2015   ALKPHOS 87 12/20/2015   BILITOT 0.9 12/20/2015   GFRNONAA 28 (L) 07/02/2016   GFRAA 32 (L) 07/02/2016    Lab Results  Component Value Date   WBC 10.6 07/22/2016   NEUTROABS 10.0 (H) 07/22/2016   HGB 8.1 (L) 07/22/2016   HCT 25.8 (L) 07/22/2016   MCV 74.1 (L) 07/22/2016   PLT 236 07/22/2016  Lab Results  Component Value Date   IRON 16 (L) 07/22/2016   TIBC 364 07/22/2016   IRONPCTSAT 4 (L) 07/22/2016    Lab Results  Component Value Date   FERRITIN 35 07/22/2016     STUDIES: Dg Chest 2 View  Result Date: 07/02/2016 CLINICAL DATA:  Respiratory failure, pneumonia, CHF exacerbation EXAM: CHEST  2 VIEW COMPARISON:  Chest x-ray of 07/01/2016 FINDINGS: Moderate cardiomegaly is present. There is evidence of pulmonary vascular congestion with small effusions most consistent with CHF. Pneumonia particularly at the left lung base cannot be excluded. No acute bony abnormality is seen with degenerative change diffusely throughout the thoracic spine. IMPRESSION: Cardiomegaly and probable mild CHF. Difficult to exclude pneumonia at the left lung base. Recommend followup. Electronically Signed   By: Ivar Drape M.D.   On: 07/02/2016 09:05   Dg Chest Port 1 View  Result Date: 07/01/2016 CLINICAL DATA:  Acute respiratory failure. EXAM: PORTABLE CHEST 1 VIEW COMPARISON:  One-view chest x-ray 4/8/ 18 and two-view chest x-ray 04/02/2016. FINDINGS: The heart is mildly enlarged. Atherosclerotic calcifications are present at the  aortic arch. Chronic interstitial coarsening is present. Asymmetric superimposed right upper or superior segment lower lobe airspace disease is again seen. Chronic scarring is present at both bases. Pleural thickening is stable. IMPRESSION: 1. Persistent right upper or lower lobe airspace disease remains concerning for pneumonia. 2. Chronic interstitial lung disease. 3. Atherosclerosis of the aorta. Electronically Signed   By: San Morelle M.D.   On: 07/01/2016 07:18   Dg Chest Portable 1 View  Result Date: 06/30/2016 CLINICAL DATA:  Shortness of breath. Oxygen dependent. Mild hemoptysis. EXAM: PORTABLE CHEST 1 VIEW COMPARISON:  04/02/2016 and 01/22/2016 FINDINGS: Lungs are adequately inflated with stable bibasilar opacification and worsening airspace opacification over the right midlung suggesting acute on chronic process and may be due to infection. Mild blunting of the costophrenic angles unchanged. Cardiomegaly unchanged. Calcified plaque over the thoracic aorta. Degenerative change of the spine. IMPRESSION: Worsening airspace opacification over the right midlung which may represent acute infection. Chronic bibasilar opacification and chronic blunting of the costophrenic angles. Stable cardiomegaly.  Aortic atherosclerosis. Electronically Signed   By: Marin Olp M.D.   On: 06/30/2016 22:09    ASSESSMENT: Iron deficiency anemia, anemia secondary to chronic renal failure.  PLAN:    1. Iron deficiency anemia, anemia secondary to chronic renal failure: Patient continues have decreased hemoglobin and iron stores, therefore will proceed with an additional 510 mg IV Feraheme later this week. Patient will then return to clinic in 1 week to receive Procrit. He will then return monthly for laboratory work and consideration of Procrit if his hemoglobin remains below 10.0. Finally, he will follow-up in 4 months for further evaluation. 2. Lingula mass/mediastinal lymphadenopathy: Despite concern for  underlying malignancy, possibly lymphoma, patient previously elected not to pursue biopsy and continue simple observation. Peripheral blood flow cytometry is negative.   3. Chronic renal insufficiency: Creatinine slightly worse, treatment per nephrology. 4. CHF: Continue evaluation and treatment per cardiology.  Patient expressed understanding and was in agreement with this plan. He also understands that He can call clinic at any time with any questions, concerns, or complaints.    Lloyd Huger, MD   07/22/2016 5:04 PM

## 2016-07-22 ENCOUNTER — Inpatient Hospital Stay: Attending: Oncology | Admitting: Oncology

## 2016-07-22 ENCOUNTER — Inpatient Hospital Stay

## 2016-07-22 VITALS — BP 163/83 | HR 97 | Temp 98.6°F | Resp 18 | Wt 205.9 lb

## 2016-07-22 DIAGNOSIS — Z87891 Personal history of nicotine dependence: Secondary | ICD-10-CM | POA: Insufficient documentation

## 2016-07-22 DIAGNOSIS — Z7982 Long term (current) use of aspirin: Secondary | ICD-10-CM | POA: Diagnosis not present

## 2016-07-22 DIAGNOSIS — I5042 Chronic combined systolic (congestive) and diastolic (congestive) heart failure: Secondary | ICD-10-CM | POA: Insufficient documentation

## 2016-07-22 DIAGNOSIS — I700 Atherosclerosis of aorta: Secondary | ICD-10-CM | POA: Insufficient documentation

## 2016-07-22 DIAGNOSIS — D631 Anemia in chronic kidney disease: Secondary | ICD-10-CM | POA: Insufficient documentation

## 2016-07-22 DIAGNOSIS — I130 Hypertensive heart and chronic kidney disease with heart failure and stage 1 through stage 4 chronic kidney disease, or unspecified chronic kidney disease: Secondary | ICD-10-CM | POA: Insufficient documentation

## 2016-07-22 DIAGNOSIS — N183 Chronic kidney disease, stage 3 unspecified: Secondary | ICD-10-CM

## 2016-07-22 DIAGNOSIS — Z794 Long term (current) use of insulin: Secondary | ICD-10-CM | POA: Diagnosis not present

## 2016-07-22 DIAGNOSIS — K219 Gastro-esophageal reflux disease without esophagitis: Secondary | ICD-10-CM | POA: Diagnosis not present

## 2016-07-22 DIAGNOSIS — I482 Chronic atrial fibrillation: Secondary | ICD-10-CM | POA: Insufficient documentation

## 2016-07-22 DIAGNOSIS — D509 Iron deficiency anemia, unspecified: Secondary | ICD-10-CM

## 2016-07-22 DIAGNOSIS — R590 Localized enlarged lymph nodes: Secondary | ICD-10-CM | POA: Diagnosis not present

## 2016-07-22 DIAGNOSIS — Z9981 Dependence on supplemental oxygen: Secondary | ICD-10-CM | POA: Diagnosis not present

## 2016-07-22 DIAGNOSIS — Z79899 Other long term (current) drug therapy: Secondary | ICD-10-CM | POA: Insufficient documentation

## 2016-07-22 DIAGNOSIS — J449 Chronic obstructive pulmonary disease, unspecified: Secondary | ICD-10-CM | POA: Diagnosis not present

## 2016-07-22 DIAGNOSIS — E785 Hyperlipidemia, unspecified: Secondary | ICD-10-CM | POA: Diagnosis not present

## 2016-07-22 DIAGNOSIS — E1122 Type 2 diabetes mellitus with diabetic chronic kidney disease: Secondary | ICD-10-CM | POA: Diagnosis not present

## 2016-07-22 DIAGNOSIS — J9611 Chronic respiratory failure with hypoxia: Secondary | ICD-10-CM | POA: Diagnosis not present

## 2016-07-22 LAB — IRON AND TIBC
IRON: 16 ug/dL — AB (ref 45–182)
Saturation Ratios: 4 % — ABNORMAL LOW (ref 17.9–39.5)
TIBC: 364 ug/dL (ref 250–450)
UIBC: 348 ug/dL

## 2016-07-22 LAB — CBC WITH DIFFERENTIAL/PLATELET
BASOS PCT: 0 %
Basophils Absolute: 0 10*3/uL (ref 0–0.1)
Eosinophils Absolute: 0 10*3/uL (ref 0–0.7)
Eosinophils Relative: 0 %
HCT: 25.8 % — ABNORMAL LOW (ref 40.0–52.0)
HEMOGLOBIN: 8.1 g/dL — AB (ref 13.0–18.0)
LYMPHS ABS: 0.4 10*3/uL — AB (ref 1.0–3.6)
LYMPHS PCT: 4 %
MCH: 23.4 pg — AB (ref 26.0–34.0)
MCHC: 31.6 g/dL — AB (ref 32.0–36.0)
MCV: 74.1 fL — AB (ref 80.0–100.0)
Monocytes Absolute: 0.2 10*3/uL (ref 0.2–1.0)
Monocytes Relative: 2 %
NEUTROS PCT: 94 %
Neutro Abs: 10 10*3/uL — ABNORMAL HIGH (ref 1.4–6.5)
PLATELETS: 236 10*3/uL (ref 150–440)
RBC: 3.47 MIL/uL — ABNORMAL LOW (ref 4.40–5.90)
RDW: 21.2 % — AB (ref 11.5–14.5)
WBC: 10.6 10*3/uL (ref 3.8–10.6)

## 2016-07-22 LAB — FERRITIN: Ferritin: 35 ng/mL (ref 24–336)

## 2016-07-22 NOTE — Progress Notes (Signed)
Offers no complaints  

## 2016-07-23 ENCOUNTER — Telehealth: Payer: Self-pay | Admitting: Pulmonary Disease

## 2016-07-23 DIAGNOSIS — I130 Hypertensive heart and chronic kidney disease with heart failure and stage 1 through stage 4 chronic kidney disease, or unspecified chronic kidney disease: Secondary | ICD-10-CM | POA: Diagnosis not present

## 2016-07-23 DIAGNOSIS — J449 Chronic obstructive pulmonary disease, unspecified: Secondary | ICD-10-CM | POA: Diagnosis not present

## 2016-07-23 DIAGNOSIS — I481 Persistent atrial fibrillation: Secondary | ICD-10-CM | POA: Diagnosis not present

## 2016-07-23 DIAGNOSIS — Z9981 Dependence on supplemental oxygen: Secondary | ICD-10-CM | POA: Diagnosis not present

## 2016-07-23 DIAGNOSIS — E1122 Type 2 diabetes mellitus with diabetic chronic kidney disease: Secondary | ICD-10-CM | POA: Diagnosis not present

## 2016-07-23 DIAGNOSIS — I272 Pulmonary hypertension, unspecified: Secondary | ICD-10-CM | POA: Diagnosis not present

## 2016-07-23 DIAGNOSIS — I5042 Chronic combined systolic (congestive) and diastolic (congestive) heart failure: Secondary | ICD-10-CM | POA: Diagnosis not present

## 2016-07-23 DIAGNOSIS — J9611 Chronic respiratory failure with hypoxia: Secondary | ICD-10-CM | POA: Diagnosis not present

## 2016-07-23 DIAGNOSIS — J8410 Pulmonary fibrosis, unspecified: Secondary | ICD-10-CM | POA: Diagnosis not present

## 2016-07-23 DIAGNOSIS — N183 Chronic kidney disease, stage 3 (moderate): Secondary | ICD-10-CM | POA: Diagnosis not present

## 2016-07-23 NOTE — Telephone Encounter (Signed)
Calling to check status of orders faxed on 3/27

## 2016-07-23 NOTE — Telephone Encounter (Signed)
Informed Pamala Hurry with hospice the message would be sent to provider and once signed I will fax.   Please sign papers in folder on this pt so they can be faxed back to Hospice. Thanks.

## 2016-07-25 ENCOUNTER — Inpatient Hospital Stay: Attending: Oncology

## 2016-07-25 VITALS — BP 148/84 | HR 80

## 2016-07-25 DIAGNOSIS — K219 Gastro-esophageal reflux disease without esophagitis: Secondary | ICD-10-CM | POA: Diagnosis not present

## 2016-07-25 DIAGNOSIS — Z7982 Long term (current) use of aspirin: Secondary | ICD-10-CM | POA: Diagnosis not present

## 2016-07-25 DIAGNOSIS — I5042 Chronic combined systolic (congestive) and diastolic (congestive) heart failure: Secondary | ICD-10-CM | POA: Diagnosis not present

## 2016-07-25 DIAGNOSIS — I700 Atherosclerosis of aorta: Secondary | ICD-10-CM | POA: Diagnosis not present

## 2016-07-25 DIAGNOSIS — Z79899 Other long term (current) drug therapy: Secondary | ICD-10-CM | POA: Insufficient documentation

## 2016-07-25 DIAGNOSIS — Z87891 Personal history of nicotine dependence: Secondary | ICD-10-CM | POA: Insufficient documentation

## 2016-07-25 DIAGNOSIS — E1122 Type 2 diabetes mellitus with diabetic chronic kidney disease: Secondary | ICD-10-CM | POA: Insufficient documentation

## 2016-07-25 DIAGNOSIS — R590 Localized enlarged lymph nodes: Secondary | ICD-10-CM | POA: Diagnosis not present

## 2016-07-25 DIAGNOSIS — D631 Anemia in chronic kidney disease: Secondary | ICD-10-CM | POA: Insufficient documentation

## 2016-07-25 DIAGNOSIS — D638 Anemia in other chronic diseases classified elsewhere: Secondary | ICD-10-CM

## 2016-07-25 DIAGNOSIS — E785 Hyperlipidemia, unspecified: Secondary | ICD-10-CM | POA: Insufficient documentation

## 2016-07-25 DIAGNOSIS — Z9981 Dependence on supplemental oxygen: Secondary | ICD-10-CM | POA: Insufficient documentation

## 2016-07-25 DIAGNOSIS — N183 Chronic kidney disease, stage 3 unspecified: Secondary | ICD-10-CM

## 2016-07-25 DIAGNOSIS — J9611 Chronic respiratory failure with hypoxia: Secondary | ICD-10-CM | POA: Diagnosis not present

## 2016-07-25 DIAGNOSIS — I130 Hypertensive heart and chronic kidney disease with heart failure and stage 1 through stage 4 chronic kidney disease, or unspecified chronic kidney disease: Secondary | ICD-10-CM | POA: Diagnosis not present

## 2016-07-25 DIAGNOSIS — Z794 Long term (current) use of insulin: Secondary | ICD-10-CM | POA: Insufficient documentation

## 2016-07-25 DIAGNOSIS — J8410 Pulmonary fibrosis, unspecified: Secondary | ICD-10-CM | POA: Diagnosis not present

## 2016-07-25 DIAGNOSIS — I482 Chronic atrial fibrillation: Secondary | ICD-10-CM | POA: Diagnosis not present

## 2016-07-25 DIAGNOSIS — J449 Chronic obstructive pulmonary disease, unspecified: Secondary | ICD-10-CM | POA: Diagnosis not present

## 2016-07-25 MED ORDER — SODIUM CHLORIDE 0.9 % IV SOLN
Freq: Once | INTRAVENOUS | Status: AC
  Administered 2016-07-25: 14:00:00 via INTRAVENOUS
  Filled 2016-07-25: qty 1000

## 2016-07-25 MED ORDER — SODIUM CHLORIDE 0.9 % IV SOLN
510.0000 mg | Freq: Once | INTRAVENOUS | Status: AC
  Administered 2016-07-25: 510 mg via INTRAVENOUS
  Filled 2016-07-25: qty 17

## 2016-07-26 DIAGNOSIS — J8410 Pulmonary fibrosis, unspecified: Secondary | ICD-10-CM | POA: Diagnosis not present

## 2016-07-26 DIAGNOSIS — I5042 Chronic combined systolic (congestive) and diastolic (congestive) heart failure: Secondary | ICD-10-CM | POA: Diagnosis not present

## 2016-07-26 DIAGNOSIS — J449 Chronic obstructive pulmonary disease, unspecified: Secondary | ICD-10-CM | POA: Diagnosis not present

## 2016-07-26 DIAGNOSIS — E1122 Type 2 diabetes mellitus with diabetic chronic kidney disease: Secondary | ICD-10-CM | POA: Diagnosis not present

## 2016-07-26 DIAGNOSIS — J9611 Chronic respiratory failure with hypoxia: Secondary | ICD-10-CM | POA: Diagnosis not present

## 2016-07-26 DIAGNOSIS — I130 Hypertensive heart and chronic kidney disease with heart failure and stage 1 through stage 4 chronic kidney disease, or unspecified chronic kidney disease: Secondary | ICD-10-CM | POA: Diagnosis not present

## 2016-07-29 ENCOUNTER — Inpatient Hospital Stay

## 2016-07-29 VITALS — BP 138/81 | HR 91 | Resp 18

## 2016-07-29 DIAGNOSIS — D509 Iron deficiency anemia, unspecified: Secondary | ICD-10-CM

## 2016-07-29 DIAGNOSIS — E1122 Type 2 diabetes mellitus with diabetic chronic kidney disease: Secondary | ICD-10-CM | POA: Diagnosis not present

## 2016-07-29 DIAGNOSIS — E785 Hyperlipidemia, unspecified: Secondary | ICD-10-CM | POA: Diagnosis not present

## 2016-07-29 DIAGNOSIS — R590 Localized enlarged lymph nodes: Secondary | ICD-10-CM | POA: Diagnosis not present

## 2016-07-29 DIAGNOSIS — N183 Chronic kidney disease, stage 3 unspecified: Secondary | ICD-10-CM

## 2016-07-29 DIAGNOSIS — I130 Hypertensive heart and chronic kidney disease with heart failure and stage 1 through stage 4 chronic kidney disease, or unspecified chronic kidney disease: Secondary | ICD-10-CM | POA: Diagnosis not present

## 2016-07-29 DIAGNOSIS — D631 Anemia in chronic kidney disease: Secondary | ICD-10-CM | POA: Diagnosis not present

## 2016-07-29 DIAGNOSIS — D638 Anemia in other chronic diseases classified elsewhere: Secondary | ICD-10-CM

## 2016-07-29 DIAGNOSIS — K219 Gastro-esophageal reflux disease without esophagitis: Secondary | ICD-10-CM | POA: Diagnosis not present

## 2016-07-29 DIAGNOSIS — Z79899 Other long term (current) drug therapy: Secondary | ICD-10-CM | POA: Diagnosis not present

## 2016-07-29 DIAGNOSIS — I482 Chronic atrial fibrillation: Secondary | ICD-10-CM | POA: Diagnosis not present

## 2016-07-29 LAB — HEMOGLOBIN: HEMOGLOBIN: 9.1 g/dL — AB (ref 13.0–18.0)

## 2016-07-29 MED ORDER — EPOETIN ALFA 40000 UNIT/ML IJ SOLN
40000.0000 [IU] | Freq: Once | INTRAMUSCULAR | Status: AC
  Administered 2016-07-29: 40000 [IU] via SUBCUTANEOUS
  Filled 2016-07-29: qty 1

## 2016-07-30 DIAGNOSIS — J9611 Chronic respiratory failure with hypoxia: Secondary | ICD-10-CM | POA: Diagnosis not present

## 2016-07-30 DIAGNOSIS — I5042 Chronic combined systolic (congestive) and diastolic (congestive) heart failure: Secondary | ICD-10-CM | POA: Diagnosis not present

## 2016-07-30 DIAGNOSIS — J449 Chronic obstructive pulmonary disease, unspecified: Secondary | ICD-10-CM | POA: Diagnosis not present

## 2016-07-30 DIAGNOSIS — J8410 Pulmonary fibrosis, unspecified: Secondary | ICD-10-CM | POA: Diagnosis not present

## 2016-07-30 DIAGNOSIS — I130 Hypertensive heart and chronic kidney disease with heart failure and stage 1 through stage 4 chronic kidney disease, or unspecified chronic kidney disease: Secondary | ICD-10-CM | POA: Diagnosis not present

## 2016-07-30 DIAGNOSIS — E1122 Type 2 diabetes mellitus with diabetic chronic kidney disease: Secondary | ICD-10-CM | POA: Diagnosis not present

## 2016-08-01 ENCOUNTER — Ambulatory Visit: Payer: Medicare Other | Attending: Family | Admitting: Family

## 2016-08-01 VITALS — BP 151/73 | HR 73 | Resp 20 | Ht 67.0 in | Wt 202.5 lb

## 2016-08-01 DIAGNOSIS — J449 Chronic obstructive pulmonary disease, unspecified: Secondary | ICD-10-CM | POA: Diagnosis not present

## 2016-08-01 DIAGNOSIS — Z7982 Long term (current) use of aspirin: Secondary | ICD-10-CM | POA: Insufficient documentation

## 2016-08-01 DIAGNOSIS — I5032 Chronic diastolic (congestive) heart failure: Secondary | ICD-10-CM | POA: Diagnosis not present

## 2016-08-01 DIAGNOSIS — N183 Chronic kidney disease, stage 3 unspecified: Secondary | ICD-10-CM

## 2016-08-01 DIAGNOSIS — E785 Hyperlipidemia, unspecified: Secondary | ICD-10-CM | POA: Diagnosis not present

## 2016-08-01 DIAGNOSIS — Z883 Allergy status to other anti-infective agents status: Secondary | ICD-10-CM | POA: Insufficient documentation

## 2016-08-01 DIAGNOSIS — Z87891 Personal history of nicotine dependence: Secondary | ICD-10-CM | POA: Diagnosis not present

## 2016-08-01 DIAGNOSIS — I482 Chronic atrial fibrillation: Secondary | ICD-10-CM | POA: Diagnosis not present

## 2016-08-01 DIAGNOSIS — Z794 Long term (current) use of insulin: Secondary | ICD-10-CM | POA: Diagnosis not present

## 2016-08-01 DIAGNOSIS — I130 Hypertensive heart and chronic kidney disease with heart failure and stage 1 through stage 4 chronic kidney disease, or unspecified chronic kidney disease: Secondary | ICD-10-CM | POA: Insufficient documentation

## 2016-08-01 DIAGNOSIS — J8410 Pulmonary fibrosis, unspecified: Secondary | ICD-10-CM | POA: Diagnosis not present

## 2016-08-01 DIAGNOSIS — K219 Gastro-esophageal reflux disease without esophagitis: Secondary | ICD-10-CM | POA: Diagnosis not present

## 2016-08-01 DIAGNOSIS — E1122 Type 2 diabetes mellitus with diabetic chronic kidney disease: Secondary | ICD-10-CM | POA: Diagnosis not present

## 2016-08-01 DIAGNOSIS — J9611 Chronic respiratory failure with hypoxia: Secondary | ICD-10-CM | POA: Diagnosis not present

## 2016-08-01 DIAGNOSIS — Z79899 Other long term (current) drug therapy: Secondary | ICD-10-CM | POA: Diagnosis not present

## 2016-08-01 DIAGNOSIS — I5042 Chronic combined systolic (congestive) and diastolic (congestive) heart failure: Secondary | ICD-10-CM | POA: Diagnosis not present

## 2016-08-01 DIAGNOSIS — Z8249 Family history of ischemic heart disease and other diseases of the circulatory system: Secondary | ICD-10-CM | POA: Insufficient documentation

## 2016-08-01 DIAGNOSIS — I10 Essential (primary) hypertension: Secondary | ICD-10-CM

## 2016-08-01 NOTE — Patient Instructions (Signed)
Continue weighing daily and call for an overnight weight gain of > 2 pounds or a weekly weight gain of >5 pounds. 

## 2016-08-01 NOTE — Progress Notes (Signed)
Patient ID: Patrick [name], male    DOB: [birth year], 83 y.o.   MRN: [id number]  HPI  Patrick [name] is a 83 y/o male with a history of chronic kidney disease (stage III-IV), diabetes on insulin, HTN, persistent atrial fibrillation, hyperlipidemia, anemia, remote tobacco use and chronic diastolic heart failure.  Last echo done 01/22/16 which showed an EF of 60-65%without any aortic stenosis and trivial tricuspid regurgitation. Elevated PA pressure of 70 mm Hg. This is an improvement from his previous echocardiogram which showed an EF of 40-45%.  Admitted 06/30/16 due to pneumonia and HF exacerbation. Initially required bipap and then transitioned back to nasal canula oxygen at 3L. Needed 1 unit of PRBC's due to anemia. Discharged home with hospice services after 2 days. Admitted on 04/02/16 with acute bronchitis. Treated with IV steroids, antibiotics and nebulizers. Discharged home with prednisone. Previous admission was on 01/22/16 for acute on chronic heart failure. Patient was diuresed and cardiology consult obtained. Discharged in 2 days. Also admitted on 12/20/15 with exacerbation of heart failure. Was IV diuresed, was seen by cardiology and discharged home. Torsemide ordered for every other day.   Returns today for a follow-up visit with a chief complaint of chronic fatigue. He says that he gets fatigued very easily and this has been present for the last several years. He has associated edema along with this. Does wear oxygen at 3L around the clock.   Past Medical History:  Diagnosis Date  . Amputated finger   . Anemia of chronic disease    a. plan of oncology to start Procrit - received during admission 12/2013  . Atypical chest pain    a. 12/2014 Neg CE - in setting of L pleural effusion.  . Bell's palsy   . Chronic combined systolic and diastolic CHF (congestive heart failure) ([place])    a. 11/2012 Echo: EF 60-65%, mod conc LVH, mildly dil LA/RA, mild Ao sclerosis w/o stenosis; b.  11/2014 Echo: EF 40-45%, prob mid-apicalanteroseptal, ant, apical HK, Gr2 DD, mod dil LA, mildly dil RA (technically difficult study); c. echo 11/2015: EF 60-65%, trivial AI, nl RV sys fxan, PASP 59; d. echo 10/17: EF 60-65%, no RWMA, mildly dilated LA, mildly reduced RV sys fxn, PASP 70  . CKD (chronic kidney disease), stage III    a. stage III-IV  . Diabetes mellitus without complication ([place])   . GERD (gastroesophageal reflux disease)   . History of gastroesophageal reflux (GERD)   . Hyperlipidemia   . Hypertension   . Permanent atrial fibrillation ([place])    a. Dx 12/2011, Rate-controlled, chronic Xarelto (renal dosing) - CHA2DS2VASc = 5.  . Pleural effusion, left    a. 12/2014 s/p thoracentesis - protein <3, LDH 123, no malignancy.   Past Surgical History:  Procedure Laterality Date  . APPENDECTOMY    . COLONOSCOPY  09/2011  . ESOPHAGOGASTRODUODENOSCOPY    . FINGER SURGERY     right hand   Family History  Problem Relation Age of Onset  . Heart attack Brother   . Diabetes Mother   . Diabetes Sister   . Hypertension Unknown    Social History  Substance Use Topics  . Smoking status: Former Smoker    Packs/day: 0.25    Years: 10.00    Types: Cigars    Quit date: 05/06/1966  . Smokeless tobacco: Never Used  . Alcohol use No   No Known Allergies  Prior to Admission medications   Medication Sig Start Date End Date Taking? Authorizing  Provider  aspirin 81 MG tablet Take 81 mg by mouth daily.   Yes [provider]  benzonatate (TESSALON) 200 MG capsule Take 1 capsule (200 mg total) by mouth 3 (three) times daily as needed for cough. 07/02/16  Yes Gladstone Lighter, MD  cloNIDine (CATAPRES) 0.1 MG tablet Take 1 tablet (0.1 mg total) by mouth 2 (two) times daily. 04/15/16  Yes Chanz Cahall, Otila Kluver A, FNP  docusate sodium (COLACE) 100 MG capsule Take 100 mg by mouth daily.   Yes [provider]  insulin NPH-regular Human (NOVOLIN 70/30) (70-30) 100 UNIT/ML injection Inject  35 Units into the skin 2 (two) times daily with a meal. 07/02/16  Yes Gladstone Lighter, MD  losartan (COZAAR) 50 MG tablet Take 1 tablet (50 mg total) by mouth daily. 07/02/16  Yes Gladstone Lighter, MD  morphine (ROXANOL) 20 MG/ML concentrated solution Take 0.25 mLs (5 mg total) by mouth every 4 (four) hours as needed for severe pain or shortness of breath. 07/02/16  Yes Gladstone Lighter, MD  omeprazole (PRILOSEC) 40 MG capsule Take 1 capsule (40 mg total) by mouth daily. 04/15/16  Yes Jimeka Balan A, FNP  OXYGEN Inhale 2-4 L into the lungs continuous.   Yes [provider]  potassium chloride (K-DUR) 10 MEQ tablet Take 1 tablet (10 mEq total) by mouth 2 (two) times daily. 07/18/16 10/16/16 Yes Gollan, Kathlene November, MD  pravastatin (PRAVACHOL) 40 MG tablet Take 40 mg by mouth at bedtime.    Yes [provider]  predniSONE (DELTASONE) 5 MG tablet Take 10 mg by mouth daily with breakfast.    Yes [provider]  torsemide (DEMADEX) 20 MG tablet Take 2 tablets (40 mg total) by mouth 2 (two) times daily as needed. Patient taking differently: Take 20 mg by mouth 2 (two) times daily as needed.  07/18/16  Yes Minna Merritts, MD    Review of Systems  Constitutional: Positive for fatigue. Negative for appetite change.  HENT: Negative for congestion, postnasal drip and sore throat.   Eyes: Negative.   Respiratory: Negative for cough, chest tightness and shortness of breath.   Cardiovascular: Positive for leg swelling. Negative for chest pain and palpitations.  Gastrointestinal: Negative for abdominal distention and abdominal pain.  Endocrine: Negative.   Genitourinary: Negative.   Musculoskeletal: Negative for back pain and neck pain.  Skin: Negative.   Allergic/Immunologic: Negative.   Neurological: Negative for dizziness and light-headedness.  Hematological: Negative for adenopathy. Does not bruise/bleed easily.  Psychiatric/Behavioral: Negative for dysphoric mood and  sleep disturbance (wearing oxygen at 3-4L around the clock). The patient is not nervous/anxious.    Vitals:   08/01/16 1235  BP: (!) 151/73  Pulse: 73  Resp: 20  SpO2: 100%  Weight: 202 lb 8 oz (91.9 kg)  Height: 5\' 7"  (1.702 m)   Wt Readings from Last 3 Encounters:  08/01/16 202 lb 8 oz (91.9 kg)  07/22/16 205 lb 14.4 oz (93.4 kg)  07/18/16 202 lb 8 oz (91.9 kg)    Lab Results  Component Value Date   CREATININE 2.07 (H) 07/02/2016   CREATININE 1.89 (H) 07/01/2016   CREATININE 2.05 (H) 07/01/2016   Physical Exam  Constitutional: He is oriented to person, place, and time. He appears well-developed and well-nourished.  HENT:  Head: Normocephalic and atraumatic.  Neck: Normal range of motion. Neck supple. No JVD present.  Cardiovascular: Normal rate and regular rhythm.   Pulmonary/Chest: Effort normal. He has no wheezes. He has no rales.  Abdominal:  Soft. He exhibits no distension. There is no tenderness.  Musculoskeletal: He exhibits edema (2+ pitting edema in bilateral lower legs). He exhibits no tenderness.  Neurological: He is alert and oriented to person, place, and time.  Skin: Skin is warm and dry.  Psychiatric: He has a normal mood and affect. His behavior is normal. Thought content normal.  Nursing note and vitals reviewed.     Assessment & Plan:  1: Chronic heart failure with preserved ejection fraction-  - NYHA class III - mildly fluid overloaded today with increased edema in lower legs.  - Continue daily weights and call for an overnight weight gain of >2 pounds or weekly weight gain of >5 pounds. Weight stable since last time he was here - elevate legs when sitting at home as much as possible. Patient admits that he doesn't elevate them much and in the morning after he's been in bed all night, the swelling is better - is now taking 40mg  torsemide BID for wt> 198 pounds but wife is concerned about renal function. Discussed risks/benefits of taking the torsemide to  remove the fluid and she verbalizes understanding. Wt >193 pounds, he takes 40mg  torsemide AM and weight <193 pounds, take 20mg  torsemide AM; taking extra potassium as well - not adding salt to his food - last saw cardiologist [name]) on 07/18/16 - has home health nurse  - saw pulmonologist Alva Garnet) 05/17/16  2: HTN- -Blood pressure looks good -Home blood pressure readings reviewed and are elevated at times in the mornings. Limited with medications due to renal function - Wife is to continue checking blood pressure.  3: CKD, stage III-IV- -Follows closely with nephrology  -Remains uninterested in dialysis - sees nephrologist June 2018  Medication bottles were reviewed.  Return here in 1 month or sooner for any questions/problems before then.

## 2016-08-02 ENCOUNTER — Encounter: Payer: Self-pay | Admitting: Family

## 2016-08-02 DIAGNOSIS — I5032 Chronic diastolic (congestive) heart failure: Secondary | ICD-10-CM | POA: Insufficient documentation

## 2016-08-06 DIAGNOSIS — J9611 Chronic respiratory failure with hypoxia: Secondary | ICD-10-CM | POA: Diagnosis not present

## 2016-08-06 DIAGNOSIS — J8410 Pulmonary fibrosis, unspecified: Secondary | ICD-10-CM | POA: Diagnosis not present

## 2016-08-06 DIAGNOSIS — I130 Hypertensive heart and chronic kidney disease with heart failure and stage 1 through stage 4 chronic kidney disease, or unspecified chronic kidney disease: Secondary | ICD-10-CM | POA: Diagnosis not present

## 2016-08-06 DIAGNOSIS — E1122 Type 2 diabetes mellitus with diabetic chronic kidney disease: Secondary | ICD-10-CM | POA: Diagnosis not present

## 2016-08-06 DIAGNOSIS — J449 Chronic obstructive pulmonary disease, unspecified: Secondary | ICD-10-CM | POA: Diagnosis not present

## 2016-08-06 DIAGNOSIS — I5042 Chronic combined systolic (congestive) and diastolic (congestive) heart failure: Secondary | ICD-10-CM | POA: Diagnosis not present

## 2016-08-07 DIAGNOSIS — J8410 Pulmonary fibrosis, unspecified: Secondary | ICD-10-CM | POA: Diagnosis not present

## 2016-08-07 DIAGNOSIS — I130 Hypertensive heart and chronic kidney disease with heart failure and stage 1 through stage 4 chronic kidney disease, or unspecified chronic kidney disease: Secondary | ICD-10-CM | POA: Diagnosis not present

## 2016-08-07 DIAGNOSIS — I5042 Chronic combined systolic (congestive) and diastolic (congestive) heart failure: Secondary | ICD-10-CM | POA: Diagnosis not present

## 2016-08-07 DIAGNOSIS — J449 Chronic obstructive pulmonary disease, unspecified: Secondary | ICD-10-CM | POA: Diagnosis not present

## 2016-08-07 DIAGNOSIS — J9611 Chronic respiratory failure with hypoxia: Secondary | ICD-10-CM | POA: Diagnosis not present

## 2016-08-07 DIAGNOSIS — E1122 Type 2 diabetes mellitus with diabetic chronic kidney disease: Secondary | ICD-10-CM | POA: Diagnosis not present

## 2016-08-08 DIAGNOSIS — J8410 Pulmonary fibrosis, unspecified: Secondary | ICD-10-CM | POA: Diagnosis not present

## 2016-08-08 DIAGNOSIS — I5042 Chronic combined systolic (congestive) and diastolic (congestive) heart failure: Secondary | ICD-10-CM | POA: Diagnosis not present

## 2016-08-08 DIAGNOSIS — E1122 Type 2 diabetes mellitus with diabetic chronic kidney disease: Secondary | ICD-10-CM | POA: Diagnosis not present

## 2016-08-08 DIAGNOSIS — J9611 Chronic respiratory failure with hypoxia: Secondary | ICD-10-CM | POA: Diagnosis not present

## 2016-08-08 DIAGNOSIS — I130 Hypertensive heart and chronic kidney disease with heart failure and stage 1 through stage 4 chronic kidney disease, or unspecified chronic kidney disease: Secondary | ICD-10-CM | POA: Diagnosis not present

## 2016-08-08 DIAGNOSIS — J449 Chronic obstructive pulmonary disease, unspecified: Secondary | ICD-10-CM | POA: Diagnosis not present

## 2016-08-10 DIAGNOSIS — I5042 Chronic combined systolic (congestive) and diastolic (congestive) heart failure: Secondary | ICD-10-CM | POA: Diagnosis not present

## 2016-08-10 DIAGNOSIS — J9611 Chronic respiratory failure with hypoxia: Secondary | ICD-10-CM | POA: Diagnosis not present

## 2016-08-10 DIAGNOSIS — E1122 Type 2 diabetes mellitus with diabetic chronic kidney disease: Secondary | ICD-10-CM | POA: Diagnosis not present

## 2016-08-10 DIAGNOSIS — I130 Hypertensive heart and chronic kidney disease with heart failure and stage 1 through stage 4 chronic kidney disease, or unspecified chronic kidney disease: Secondary | ICD-10-CM | POA: Diagnosis not present

## 2016-08-10 DIAGNOSIS — J449 Chronic obstructive pulmonary disease, unspecified: Secondary | ICD-10-CM | POA: Diagnosis not present

## 2016-08-10 DIAGNOSIS — J8410 Pulmonary fibrosis, unspecified: Secondary | ICD-10-CM | POA: Diagnosis not present

## 2016-08-10 NOTE — Progress Notes (Signed)
Cardiology Office Note  Date:  08/13/2016   ID:  Aeron, Donaghey May 06, 1931, MRN [id number]  PCP:  [place] Pink, MD   Chief Complaint  Patient presents with  . other    4 week follow up. Patient denies chest pain and SOB. Meds reviewed verbally with patient.     HPI:  Mr. Marullo is a very pleasant 83 year old gentleman with history of  hypertension,  COPD,  emphysema diabetes,  hyperlipidemia,  GERD  Chronic atrial fibrillation Acute on chronic diastolic CHF, on torsemide  presented to the hospital December 30 2011  in atrial fibrillation with RVR,  started on rate controlling medications and discharged on Cardizem 300 mg daily with anticoagulation (xarelto).   lower extremity edema with Cardizem  Previous diagnosis of Bell's palsy  Hospital admission 01/03/2014 with shortness of breath, diastolic CHF, pleural effusions, anemia, responding well to diuresis  He presents for routine followup of his atrial fibrillation  Admission to the hospital 06/30/2016 for sepsis, pneumonia Creatinine 2.0  also with anemia,  Received one unit packed cells  In the hospital  Iron level low.    Baseline weight 190 pounds Recent weight 207 pounds Worsening lower extremity edema, abdominal bloating, shortness of breath Hemoglobin April 10 of 8.5, up from 6.9 after blood  He was instructed to increase torsemide up to 40 mg twice a day In follow-up today his  Weight down 20 pounds Eating less Edema gone in his legs  Blood pressure, 818 systolic at home Overall feels better, breathing better   no longer driving since December 2017 Followed by the cancer center for his anemia  Wife decreased torsemide back to 40 mg daily given dramatic weight loss  EKG personally reviewed by myself on todays visit Shows atrial fibrillation with ventricular rate 94 bpm no significant ST or T-wave changes    other past medical history reviewed  admitted in September 2017 with acute on chronic  diastolic CHF.  Echo at that time showed normal EF with PASP 59 mmHg.  He was diuresed and sent home on torsemide every other day.   On 01/22/16 he developed sudden onset of SOB and presented to [facility] where he was found to have acute on chronic respiratory failure felt to be multifactorial including some component of CHF exacerbation and worsening anemia with a 3 gram drop from 11.3 during prior admission to 8.6   echo showed normal LV systolic function with an EF of 60-65%, nmo RWMA, mildly dilated left atrium, mildly increased RV wall thickness with mild reduction in systolic function, PASP increased at 70 mmHg.  CT chest showed underlying emphysema, pulmonary nodule measuring 1.8 x1.1 cm extensive atherosclerotic calcification, ascending aorta measuring 4.1x37 cm  Seen by CHF clinic on 01/12/2014. Note was reviewed. No new medication changes at that time  [facility]ed [facility] on [facility]12 with increased DOE. pBNP was found to be 2708. CXR showed mild CHF with bilateral pleural effusions. He was diuresed with IV Lasix 40 mg BID with good response. He does have a narrow euvolemic window 2/2 his CKD. He was restarted back on his home dose of torsemide 20 mg bid with double dose prn weight gain. H/H has been drifting down over the past year, has been seen by Oncology in the past with a plan for Procrit once hgb <10. Procrit and iron were given this past admission.    Previously not interested in cardioversion  He has been tolerating anticoagulation at renal dosing.  CT scan of the  head in the hospital showed no intracranial process Lab work October 2013, creatinine 2.2, BUN 34 Lab work August 2013 , Hemoglobin A1c 8.1 Total cholesterol 97, LDL 48 creatinine 2.2, BUN in the 40s which is higher than his baseline BUN in the 20s. Wife reports he does not drink very much  Echocardiogram showed ejection fraction greater than 55%, mildly dilated left atrium, otherwise normal study MRI of the brain  showed chronic changes without evidence of acute abnormalities   PMH:   has a past medical history of Amputated finger; Anemia of chronic disease; Atypical chest pain; Bell's palsy; Chronic combined systolic and diastolic CHF (congestive heart failure) ([place]); CKD (chronic kidney disease), stage III; Diabetes mellitus without complication ([place]); GERD (gastroesophageal reflux disease); History of gastroesophageal reflux (GERD); Hyperlipidemia; Hypertension; Permanent atrial fibrillation ([place]); and Pleural effusion, left.  PSH:    Past Surgical History:  Procedure Laterality Date  . APPENDECTOMY    . COLONOSCOPY  09/2011  . ESOPHAGOGASTRODUODENOSCOPY    . FINGER SURGERY     right hand    Current Outpatient Prescriptions  Medication Sig Dispense Refill  . aspirin 81 MG tablet Take 81 mg by mouth daily.    . benzonatate (TESSALON) 200 MG capsule Take 1 capsule (200 mg total) by mouth 3 (three) times daily as needed for cough. 20 capsule 0  . cloNIDine (CATAPRES) 0.2 MG tablet Take 1 tablet (0.2 mg total) by mouth 2 (two) times daily. 180 tablet 3  . docusate sodium (COLACE) 100 MG capsule Take 100 mg by mouth daily.    . insulin NPH-regular Human (NOVOLIN 70/30) (70-30) 100 UNIT/ML injection Inject 35 Units into the skin 2 (two) times daily with a meal. 10 mL 11  . LORazepam (ATIVAN) 0.5 MG tablet Take 0.5 mg by mouth every 8 (eight) hours as needed for anxiety.    Marland Kitchen losartan (COZAAR) 50 MG tablet Take 1 tablet (50 mg total) by mouth daily. 30 tablet 2  . morphine (ROXANOL) 20 MG/ML concentrated solution Take 0.25 mLs (5 mg total) by mouth every 4 (four) hours as needed for severe pain or shortness of breath. 30 mL 0  . omeprazole (PRILOSEC) 40 MG capsule Take 1 capsule (40 mg total) by mouth daily. 90 capsule 3  . OXYGEN Inhale 2-4 L into the lungs continuous.    . potassium chloride (K-DUR) 10 MEQ tablet Take 1 tablet (10 mEq total) by mouth 2 (two) times daily. 180 tablet 3  . pravastatin  (PRAVACHOL) 40 MG tablet Take 40 mg by mouth at bedtime.     . predniSONE (DELTASONE) 5 MG tablet Take 10 mg by mouth daily with breakfast.     . torsemide (DEMADEX) 20 MG tablet Take 2 tablets (40 mg total) by mouth 2 (two) times daily as needed. (Patient taking differently: Take 20 mg by mouth 2 (two) times daily as needed. ) 120 tablet 3   No current facility-administered medications for this visit.    Facility-Administered Medications Ordered in Other Visits  Medication Dose Route Frequency Provider Last Rate Last Dose  . epoetin alfa (EPOGEN,PROCRIT) injection 40,000 Units  40,000 Units Subcutaneous Once Lloyd Huger, MD         Allergies:   Patient has no known allergies.   Social History:  The patient  reports that he quit smoking about 50 years ago. His smoking use included Cigars. He has a 2.50 pack-year smoking history. He has never used smokeless tobacco. He reports that he does not  drink alcohol or use drugs.   Family History:   family history includes Diabetes in his mother and sister; Heart attack in his brother.    Review of Systems: Review of Systems  Constitutional: Positive for malaise/fatigue.       Weight gain  Respiratory: Negative.   Cardiovascular: Negative.   Gastrointestinal: Negative.   Musculoskeletal: Negative.   Neurological: Negative.   Psychiatric/Behavioral: Negative.   All other systems reviewed and are negative.    PHYSICAL EXAM: VS:  BP (!) 164/80   Pulse 88   Ht 5\' 8"  (1.727 m)   Wt 182 lb 12 oz (82.9 kg)   BMI 27.79 kg/m  , BMI Body mass index is 27.79 kg/m. GEN: Well nourished, well developed, in no acute distress , obese, on nasal cannula oxygen HEENT: normal  Neck: no JVD, carotid bruits, or masses Cardiac: Irregularly irregular no murmurs, rubs, or gallops, no edema Respiratory:  Moderately decreased breath sounds throughout, poor inspiratory effort, normal work of breathing GI: soft, nontender, nondistended, + BS MS: no  deformity or atrophy  Skin: warm and dry, no rash Neuro:  Strength and sensation are intact Psych: euthymic mood, full affect    Recent Labs: 12/20/2015: ALT 15 12/21/2015: TSH 0.859 06/30/2016: B Natriuretic Peptide 780.0 07/01/2016: Magnesium 1.8 07/02/2016: BUN 31; Creatinine, Ser 2.07; Potassium 4.5; Sodium 134 07/22/2016: Platelets 236 07/29/2016: Hemoglobin 9.1    Lipid Panel Lab Results  Component Value Date   CHOL 81 01/03/2014   HDL 16 (L) 01/03/2014   LDLCALC 43 01/03/2014   TRIG 111 01/03/2014      Wt Readings from Last 3 Encounters:  08/13/16 182 lb 12 oz (82.9 kg)  08/01/16 202 lb 8 oz (91.9 kg)  07/22/16 205 lb 14.4 oz (93.4 kg)       ASSESSMENT AND PLAN:  Persistent atrial fibrillation (HCC) - Plan: EKG 12-Lead Rate well controlled, tolerating anticoagulation  Chronic diastolic heart failure (HCC) Weight is down 20 pounds from prior clinic visit Torsemide decreased by wife down to 40 mg daily, previously 40 twice a day Given dramatic weight loss, diuresis we will check basic metabolic panel Recommended he stay on torsemide 40 daily for now He is also eating less, likely some weight loss from decreased appetite  Essential hypertension Wife reports chronically elevated high blood pressure 937 systolic Elevated on today's visit Recommended he increase clonidine up to 0.2 mg twice a day Monitor blood pressure and call our office with numbers  Mixed hyperlipidemia Encouraged him to continue on pravastatin  Type 2 diabetes mellitus with other circulatory complication, with long-term current use of insulin (HCC) Dramatic weight loss over the past 2 months, changed his diet  Anemia of chronic disease Anemia previously contributed to his CHF symptoms  Iron deficiency anemia, recently had iron infusion Hemoglobin up to 9  CKD (chronic kidney disease), stage III BMP ordered and drawn today in the office  Lymphadenopathy Previously seen by Dr.  Grayland Ormond. CLL    Total encounter time more than 25 minutes  Greater than 50% was spent in counseling and coordination of care with the patient   Disposition:   F/U  6 months   Orders Placed This Encounter  Procedures  . Basic Metabolic Panel (BMET)  . EKG 12-Lead     Signed, Esmond Plants, M.D., Ph.D. 08/13/2016  [place], [place]

## 2016-08-13 ENCOUNTER — Ambulatory Visit (INDEPENDENT_AMBULATORY_CARE_PROVIDER_SITE_OTHER): Admitting: Cardiovascular Disease

## 2016-08-13 ENCOUNTER — Encounter: Payer: Self-pay | Admitting: Cardiovascular Disease

## 2016-08-13 VITALS — BP 164/80 | HR 88 | Ht 68.0 in | Wt 182.8 lb

## 2016-08-13 DIAGNOSIS — I5032 Chronic diastolic (congestive) heart failure: Secondary | ICD-10-CM

## 2016-08-13 DIAGNOSIS — I4819 Other persistent atrial fibrillation: Secondary | ICD-10-CM

## 2016-08-13 DIAGNOSIS — N183 Chronic kidney disease, stage 3 unspecified: Secondary | ICD-10-CM

## 2016-08-13 DIAGNOSIS — J449 Chronic obstructive pulmonary disease, unspecified: Secondary | ICD-10-CM | POA: Diagnosis not present

## 2016-08-13 DIAGNOSIS — I130 Hypertensive heart and chronic kidney disease with heart failure and stage 1 through stage 4 chronic kidney disease, or unspecified chronic kidney disease: Secondary | ICD-10-CM | POA: Diagnosis not present

## 2016-08-13 DIAGNOSIS — J9611 Chronic respiratory failure with hypoxia: Secondary | ICD-10-CM | POA: Diagnosis not present

## 2016-08-13 DIAGNOSIS — J8410 Pulmonary fibrosis, unspecified: Secondary | ICD-10-CM | POA: Diagnosis not present

## 2016-08-13 DIAGNOSIS — Z794 Long term (current) use of insulin: Secondary | ICD-10-CM | POA: Diagnosis not present

## 2016-08-13 DIAGNOSIS — I10 Essential (primary) hypertension: Secondary | ICD-10-CM | POA: Diagnosis not present

## 2016-08-13 DIAGNOSIS — I481 Persistent atrial fibrillation: Secondary | ICD-10-CM | POA: Diagnosis not present

## 2016-08-13 DIAGNOSIS — I5042 Chronic combined systolic (congestive) and diastolic (congestive) heart failure: Secondary | ICD-10-CM | POA: Diagnosis not present

## 2016-08-13 DIAGNOSIS — D509 Iron deficiency anemia, unspecified: Secondary | ICD-10-CM | POA: Diagnosis not present

## 2016-08-13 DIAGNOSIS — E1159 Type 2 diabetes mellitus with other circulatory complications: Secondary | ICD-10-CM | POA: Diagnosis not present

## 2016-08-13 DIAGNOSIS — E1122 Type 2 diabetes mellitus with diabetic chronic kidney disease: Secondary | ICD-10-CM | POA: Diagnosis not present

## 2016-08-13 DIAGNOSIS — E782 Mixed hyperlipidemia: Secondary | ICD-10-CM

## 2016-08-13 MED ORDER — CLONIDINE HCL 0.2 MG PO TABS: 0.2000 mg | ORAL_TABLET | Freq: Two times a day (BID) | ORAL | 3 refills | Status: DC

## 2016-08-13 NOTE — Patient Instructions (Signed)
Medication Instructions:   Please increase the clonidine up to 0.2 mg twice a day  Labwork:  No new labs needed  Testing/Procedures:  No further testing at this time   I recommend watching educational videos on topics of interest to you at:       [email or web address]  Enter code: [email or web address]    Follow-Up: It was a pleasure seeing you in the office today. Please call us if you have new issues that need to be addressed before your next appt.  [phone]  Your physician wants you to follow-up in: 6 months.  You will receive a reminder letter in the mail two months in advance. If you don't receive a letter, please call our office to schedule the follow-up appointment.  If you need a refill on your cardiac medications before your next appointment, please call your pharmacy.

## 2016-08-14 LAB — BASIC METABOLIC PANEL
BUN / CREAT RATIO: 13 (ref 10–24)
BUN: 19 mg/dL (ref 8–27)
CHLORIDE: 102 mmol/L (ref 96–106)
CO2: 26 mmol/L (ref 18–29)
Calcium: 9.6 mg/dL (ref 8.6–10.2)
Creatinine, Ser: 1.51 mg/dL — ABNORMAL HIGH (ref 0.76–1.27)
GFR calc non Af Amer: 42 mL/min/{1.73_m2} — ABNORMAL LOW (ref >59)
GFR, EST AFRICAN AMERICAN: 49 mL/min/{1.73_m2} — AB (ref >59)
Glucose: 239 mg/dL — ABNORMAL HIGH (ref 65–99)
POTASSIUM: 4.7 mmol/L (ref 3.5–5.2)
SODIUM: 142 mmol/L (ref 134–144)

## 2016-08-15 DIAGNOSIS — I130 Hypertensive heart and chronic kidney disease with heart failure and stage 1 through stage 4 chronic kidney disease, or unspecified chronic kidney disease: Secondary | ICD-10-CM | POA: Diagnosis not present

## 2016-08-15 DIAGNOSIS — E1122 Type 2 diabetes mellitus with diabetic chronic kidney disease: Secondary | ICD-10-CM | POA: Diagnosis not present

## 2016-08-15 DIAGNOSIS — J9611 Chronic respiratory failure with hypoxia: Secondary | ICD-10-CM | POA: Diagnosis not present

## 2016-08-15 DIAGNOSIS — I5042 Chronic combined systolic (congestive) and diastolic (congestive) heart failure: Secondary | ICD-10-CM | POA: Diagnosis not present

## 2016-08-15 DIAGNOSIS — J8410 Pulmonary fibrosis, unspecified: Secondary | ICD-10-CM | POA: Diagnosis not present

## 2016-08-15 DIAGNOSIS — J449 Chronic obstructive pulmonary disease, unspecified: Secondary | ICD-10-CM | POA: Diagnosis not present

## 2016-08-16 ENCOUNTER — Telehealth: Payer: Self-pay | Admitting: Cardiovascular Disease

## 2016-08-16 NOTE — Telephone Encounter (Signed)
Please call with lab results 

## 2016-08-16 NOTE — Telephone Encounter (Signed)
Reviewed w/wife, Vickie. See lab note

## 2016-08-20 DIAGNOSIS — H2589 Other age-related cataract: Secondary | ICD-10-CM | POA: Diagnosis not present

## 2016-08-20 DIAGNOSIS — H2510 Age-related nuclear cataract, unspecified eye: Secondary | ICD-10-CM | POA: Diagnosis not present

## 2016-08-20 DIAGNOSIS — I130 Hypertensive heart and chronic kidney disease with heart failure and stage 1 through stage 4 chronic kidney disease, or unspecified chronic kidney disease: Secondary | ICD-10-CM | POA: Diagnosis not present

## 2016-08-20 DIAGNOSIS — J9611 Chronic respiratory failure with hypoxia: Secondary | ICD-10-CM | POA: Diagnosis not present

## 2016-08-20 DIAGNOSIS — E109 Type 1 diabetes mellitus without complications: Secondary | ICD-10-CM | POA: Diagnosis not present

## 2016-08-20 DIAGNOSIS — J449 Chronic obstructive pulmonary disease, unspecified: Secondary | ICD-10-CM | POA: Diagnosis not present

## 2016-08-20 DIAGNOSIS — J8410 Pulmonary fibrosis, unspecified: Secondary | ICD-10-CM | POA: Diagnosis not present

## 2016-08-20 DIAGNOSIS — E1122 Type 2 diabetes mellitus with diabetic chronic kidney disease: Secondary | ICD-10-CM | POA: Diagnosis not present

## 2016-08-20 DIAGNOSIS — Z0100 Encounter for examination of eyes and vision without abnormal findings: Secondary | ICD-10-CM | POA: Diagnosis not present

## 2016-08-20 DIAGNOSIS — I5042 Chronic combined systolic (congestive) and diastolic (congestive) heart failure: Secondary | ICD-10-CM | POA: Diagnosis not present

## 2016-08-21 ENCOUNTER — Emergency Department
Admission: EM | Admit: 2016-08-21 | Discharge: 2016-08-21 | Disposition: A | Discharge status: Home / Self Care | Attending: Emergency Medicine | Admitting: Emergency Medicine

## 2016-08-21 ENCOUNTER — Encounter: Payer: Self-pay | Admitting: Emergency Medicine

## 2016-08-21 ENCOUNTER — Emergency Department

## 2016-08-21 DIAGNOSIS — I130 Hypertensive heart and chronic kidney disease with heart failure and stage 1 through stage 4 chronic kidney disease, or unspecified chronic kidney disease: Secondary | ICD-10-CM | POA: Insufficient documentation

## 2016-08-21 DIAGNOSIS — I5042 Chronic combined systolic (congestive) and diastolic (congestive) heart failure: Secondary | ICD-10-CM | POA: Diagnosis not present

## 2016-08-21 DIAGNOSIS — Z87891 Personal history of nicotine dependence: Secondary | ICD-10-CM | POA: Diagnosis not present

## 2016-08-21 DIAGNOSIS — J9611 Chronic respiratory failure with hypoxia: Secondary | ICD-10-CM | POA: Diagnosis not present

## 2016-08-21 DIAGNOSIS — R079 Chest pain, unspecified: Secondary | ICD-10-CM | POA: Diagnosis not present

## 2016-08-21 DIAGNOSIS — Z794 Long term (current) use of insulin: Secondary | ICD-10-CM | POA: Diagnosis not present

## 2016-08-21 DIAGNOSIS — J8410 Pulmonary fibrosis, unspecified: Secondary | ICD-10-CM | POA: Diagnosis not present

## 2016-08-21 DIAGNOSIS — N183 Chronic kidney disease, stage 3 (moderate): Secondary | ICD-10-CM | POA: Insufficient documentation

## 2016-08-21 DIAGNOSIS — Z79899 Other long term (current) drug therapy: Secondary | ICD-10-CM | POA: Diagnosis not present

## 2016-08-21 DIAGNOSIS — E1122 Type 2 diabetes mellitus with diabetic chronic kidney disease: Secondary | ICD-10-CM | POA: Diagnosis not present

## 2016-08-21 DIAGNOSIS — Z7982 Long term (current) use of aspirin: Secondary | ICD-10-CM | POA: Insufficient documentation

## 2016-08-21 DIAGNOSIS — J449 Chronic obstructive pulmonary disease, unspecified: Secondary | ICD-10-CM | POA: Diagnosis not present

## 2016-08-21 LAB — TROPONIN I
Troponin I: 0.04 ng/mL (ref <0.03)
Troponin I: 0.03 ng/mL (ref <0.03)

## 2016-08-21 LAB — CBC
HCT: 35.9 % — ABNORMAL LOW (ref 40.0–52.0)
HEMOGLOBIN: 11.4 g/dL — AB (ref 13.0–18.0)
MCH: 25.3 pg — AB (ref 26.0–34.0)
MCHC: 31.6 g/dL — ABNORMAL LOW (ref 32.0–36.0)
MCV: 80.1 fL (ref 80.0–100.0)
Platelets: 189 10*3/uL (ref 150–440)
RBC: 4.48 MIL/uL (ref 4.40–5.90)
RDW: 26.9 % — ABNORMAL HIGH (ref 11.5–14.5)
WBC: 6.4 10*3/uL (ref 3.8–10.6)

## 2016-08-21 LAB — BASIC METABOLIC PANEL
ANION GAP: 8 (ref 5–15)
BUN: 25 mg/dL — ABNORMAL HIGH (ref 6–20)
CHLORIDE: 103 mmol/L (ref 101–111)
CO2: 31 mmol/L (ref 22–32)
Calcium: 9.1 mg/dL (ref 8.9–10.3)
Creatinine, Ser: 1.76 mg/dL — ABNORMAL HIGH (ref 0.61–1.24)
GFR calc non Af Amer: 34 mL/min — ABNORMAL LOW (ref >60)
GFR, EST AFRICAN AMERICAN: 39 mL/min — AB (ref >60)
GLUCOSE: 195 mg/dL — AB (ref 65–99)
Potassium: 3.7 mmol/L (ref 3.5–5.1)
Sodium: 142 mmol/L (ref 135–145)

## 2016-08-21 NOTE — ED Provider Notes (Signed)
[facility] Emergency Department Provider Note       Time seen: ----------------------------------------- 1:22 PM on 08/21/2016 -----------------------------------------     I have reviewed the triage vital signs and the nursing notes.   HISTORY   Chief Complaint Chest Pain    HPI Patrick [name] is a 83 y.o. male who presents to the ED for chest pain started at 10 AM. Patient took his home morphine as prescribed as well as aspirin given in route by EMS. Patient states the pain was dull, nothing makes it better or worse. He denies sweats, nausea, shortness of breath. He is on 2 L of oxygen chronically. No history of heart attack or chest pain.   Past Medical History:  Diagnosis Date  . Amputated finger   . Anemia of chronic disease    a. plan of oncology to start Procrit - received during admission 12/2013  . Atypical chest pain    a. 12/2014 Neg CE - in setting of L pleural effusion.  . Bell's palsy   . Chronic combined systolic and diastolic CHF (congestive heart failure) ([place])    a. 11/2012 Echo: EF 60-65%, mod conc LVH, mildly dil LA/RA, mild Ao sclerosis w/o stenosis; b. 11/2014 Echo: EF 40-45%, prob mid-apicalanteroseptal, ant, apical HK, Gr2 DD, mod dil LA, mildly dil RA (technically difficult study); c. echo 11/2015: EF 60-65%, trivial AI, nl RV sys fxan, PASP 59; d. echo 10/17: EF 60-65%, no RWMA, mildly dilated LA, mildly reduced RV sys fxn, PASP 70  . CKD (chronic kidney disease), stage III    a. stage III-IV  . Diabetes mellitus without complication ([place])   . GERD (gastroesophageal reflux disease)   . History of gastroesophageal reflux (GERD)   . Hyperlipidemia   . Hypertension   . Permanent atrial fibrillation ([place])    a. Dx 12/2011, Rate-controlled, chronic Xarelto (renal dosing) - CHA2DS2VASc = 5.  . Pleural effusion, left    a. 12/2014 s/p thoracentesis - protein <3, LDH 123, no malignancy.    Patient Active Problem List   Diagnosis Date Noted  . Chronic diastolic heart failure ([place]) 08/02/2016  . Sepsis ([place]) 06/30/2016  . Acute on chronic renal failure ([place]) 06/30/2016  . Iron deficiency anemia 05/11/2016  . Anemia, chronic renal failure, stage 3 (moderate) 05/11/2016  . Lingular mass 02/06/2016  . Hypertensive heart disease 12/21/2015  . HCAP (healthcare-associated pneumonia) 11/20/2015  . CKD (chronic kidney disease), stage III   . Orthostatic hypotension 08/08/2014  . Anemia of chronic disease   . Chronic renal disease 11/26/2012  . Acute on chronic combined systolic and diastolic CHF (congestive heart failure) ([place]) 11/26/2012  . Persistent atrial fibrillation ([place]) 01/15/2012  . Essential hypertension 01/15/2012  . Mixed hyperlipidemia 01/15/2012  . Diabetes mellitus ([place]) 01/15/2012    Past Surgical History:  Procedure Laterality Date  . APPENDECTOMY    . COLONOSCOPY  09/2011  . ESOPHAGOGASTRODUODENOSCOPY    . FINGER SURGERY     right hand    Allergies Patient has no known allergies.  Social History Social History  Substance Use Topics  . Smoking status: Former Smoker    Packs/day: 0.25    Years: 10.00    Types: Cigars    Quit date: 05/06/1966  . Smokeless tobacco: Never Used  . Alcohol use No    Review of Systems Constitutional: Negative for fever. Eyes: Negative for vision changes ENT:  Negative for congestion, sore throat Cardiovascular: Positive for chest pain Respiratory: Negative for shortness  of breath. Gastrointestinal: Negative for abdominal pain, vomiting and diarrhea. Genitourinary: Negative for dysuria. Musculoskeletal: Negative for back pain. Skin: Negative for rash. Neurological: Negative for headaches, focal weakness or numbness.  All systems negative/normal/unremarkable except as stated in the HPI  ____________________________________________   PHYSICAL EXAM:  VITAL SIGNS: ED Triage Vitals  Enc Vitals Group     BP 08/21/16 1319 133/73     Pulse  Rate 08/21/16 1319 79     Resp 08/21/16 1319 12     Temp 08/21/16 1319 98.3 F (36.8 C)     Temp Source 08/21/16 1319 Oral     SpO2 08/21/16 1319 96 %     Weight 08/21/16 1316 182 lb (82.6 kg)     Height 08/21/16 1316 5\' 8"  (1.727 m)     Head Circumference --      Peak Flow --      Pain Score 08/21/16 1316 3     Pain Loc --      Pain Edu? --      Excl. in [place]? --     Constitutional: Alert and oriented. Well appearing and in no distress. Eyes: Conjunctivae are normal. Normal extraocular movements. ENT   Head: Normocephalic and atraumatic.   Nose: No congestion/rhinnorhea.   Mouth/Throat: Mucous membranes are moist.   Neck: No stridor. Cardiovascular: Irregularly irregular rhythm. No murmurs, rubs, or gallops. Respiratory: Normal respiratory effort without tachypnea nor retractions. Breath sounds are clear and equal bilaterally. No wheezes/rales/rhonchi. Gastrointestinal: Soft and nontender. Normal bowel sounds Musculoskeletal: Nontender with normal range of motion in extremities. No lower extremity tenderness nor edema. Neurologic:  Normal speech and language. No gross focal neurologic deficits are appreciated.  Skin:  Skin is warm, dry and intact. No rash noted. Psychiatric: Mood and affect are normal. Speech and behavior are normal.  ____________________________________________  EKG: Interpreted by me. Atrial fibrillation with a rate of 81 bpm, normal PR interval, normal QRS, normal QT, normal axis.  ____________________________________________  ED COURSE:  Pertinent labs & imaging results that were available during my care of the patient were reviewed by me and considered in my medical decision making (see chart for details). Patient presents for chest pain, we will assess with labs and imaging as indicated.   Procedures ____________________________________________   LABS (pertinent positives/negatives)  Labs Reviewed  BASIC METABOLIC PANEL - Abnormal;  Notable for the following:       Result Value   Glucose, Bld 195 (*)    BUN 25 (*)    Creatinine, Ser 1.76 (*)    GFR calc non Af Amer 34 (*)    GFR calc Af Amer 39 (*)    All other components within normal limits  CBC - Abnormal; Notable for the following:    Hemoglobin 11.4 (*)    HCT 35.9 (*)    MCH 25.3 (*)    MCHC 31.6 (*)    RDW 26.9 (*)    All other components within normal limits  TROPONIN I - Abnormal; Notable for the following:    Troponin I 0.04 (*)    All other components within normal limits  TROPONIN I    RADIOLOGY Images were viewed by me  Chest x-ray IMPRESSION: 1. Similar findings of cardiomegaly and chronic bibasilar pleuroparenchymal thickening and atelectasis/scar without superimposed acute cardiopulmonary disease. 2. Aortic Atherosclerosis (ICD10-I70.0). ____________________________________________  FINAL ASSESSMENT AND PLAN  Chest pain  Plan: Patient's labs and imaging were dictated above. Patient had presented for Outpatient follow-up at the heart failure  clinic tomorrow. Hospice has been involved in his care here as well.   Earleen Newport, MD   Note: This note was generated in part or whole with voice recognition software. Voice recognition is usually quite accurate but there are transcription errors that can and very often do occur. I apologize for any typographical errors that were not detected and corrected.     Earleen Newport, MD 08/21/16 [phone]

## 2016-08-21 NOTE — Progress Notes (Signed)
ED visit made. Patient is currently followed by Hospice and [place] at home with a hospice diagnosis of COPD. He lives with his wife and is a FULL CODE. He was brought to the [facility] ED via EMS for evaluation of chest pain. Hospice was contacted after EMS was called.  Patient seen sitting up on the ED stretcher, wife Jocelyn Lamer and son Elta Guadeloupe at bedside. Patient was alert and interactive, denied any chest pain. Per  Elta Guadeloupe, he gave his father 10 mg of liquid morphine prior to EMS being called, but after about 15 minutes patient did not feel that the morphine was working and asked him to call EMS. He appears to be resting comfortable, no increased work of breathing, no edema noted to extremities. Chart notes reviewed, chest xray negative for any acute process, labs pending. Hospital care team all made  aware that patient is a current hospice patient. Hospice team updated. Will continue to follow through final disposition. Thank you. Flo Shanks RN, BSN, [facility] [place], hospital [place] [phone] c

## 2016-08-21 NOTE — ED Notes (Signed)
Lab called in order to inquire about troponin results. Lab working on troponin results now.

## 2016-08-21 NOTE — ED Notes (Signed)
ED Provider at bedside. 

## 2016-08-21 NOTE — ED Notes (Signed)
esign wouldn't work. Pt verbalized understanding of discharge instructions and has not questions at this time

## 2016-08-21 NOTE — ED Triage Notes (Signed)
Pt comes into the ED via [facility] from home c/o chest pain that started at 10:00.  Patient took .5 morphine at home and given 324 asp with EMS.  Patient in NAD at this time with even and unlabored respirations.  Patient wears 2L chronically at home , no h/o MI's, h/o a-fib and currently irregular per EMS.  Vs stable.

## 2016-08-22 ENCOUNTER — Telehealth: Payer: Self-pay

## 2016-08-22 DIAGNOSIS — J449 Chronic obstructive pulmonary disease, unspecified: Secondary | ICD-10-CM | POA: Diagnosis not present

## 2016-08-22 DIAGNOSIS — E1122 Type 2 diabetes mellitus with diabetic chronic kidney disease: Secondary | ICD-10-CM | POA: Diagnosis not present

## 2016-08-22 DIAGNOSIS — I130 Hypertensive heart and chronic kidney disease with heart failure and stage 1 through stage 4 chronic kidney disease, or unspecified chronic kidney disease: Secondary | ICD-10-CM | POA: Diagnosis not present

## 2016-08-22 DIAGNOSIS — J8410 Pulmonary fibrosis, unspecified: Secondary | ICD-10-CM | POA: Diagnosis not present

## 2016-08-22 DIAGNOSIS — J9611 Chronic respiratory failure with hypoxia: Secondary | ICD-10-CM | POA: Diagnosis not present

## 2016-08-22 DIAGNOSIS — I5042 Chronic combined systolic (congestive) and diastolic (congestive) heart failure: Secondary | ICD-10-CM | POA: Diagnosis not present

## 2016-08-22 NOTE — Telephone Encounter (Signed)
Left message about scheduling an appointment sooner than 6/14 due to ED visit.

## 2016-08-23 DIAGNOSIS — I130 Hypertensive heart and chronic kidney disease with heart failure and stage 1 through stage 4 chronic kidney disease, or unspecified chronic kidney disease: Secondary | ICD-10-CM | POA: Diagnosis not present

## 2016-08-23 DIAGNOSIS — J9611 Chronic respiratory failure with hypoxia: Secondary | ICD-10-CM | POA: Diagnosis not present

## 2016-08-23 DIAGNOSIS — J8410 Pulmonary fibrosis, unspecified: Secondary | ICD-10-CM | POA: Diagnosis not present

## 2016-08-23 DIAGNOSIS — E1122 Type 2 diabetes mellitus with diabetic chronic kidney disease: Secondary | ICD-10-CM | POA: Diagnosis not present

## 2016-08-23 DIAGNOSIS — J449 Chronic obstructive pulmonary disease, unspecified: Secondary | ICD-10-CM | POA: Diagnosis not present

## 2016-08-23 DIAGNOSIS — R634 Abnormal weight loss: Secondary | ICD-10-CM | POA: Diagnosis not present

## 2016-08-23 DIAGNOSIS — I481 Persistent atrial fibrillation: Secondary | ICD-10-CM | POA: Diagnosis not present

## 2016-08-23 DIAGNOSIS — I5042 Chronic combined systolic (congestive) and diastolic (congestive) heart failure: Secondary | ICD-10-CM | POA: Diagnosis not present

## 2016-08-23 DIAGNOSIS — Z794 Long term (current) use of insulin: Secondary | ICD-10-CM | POA: Diagnosis not present

## 2016-08-23 DIAGNOSIS — I272 Pulmonary hypertension, unspecified: Secondary | ICD-10-CM | POA: Diagnosis not present

## 2016-08-23 DIAGNOSIS — Z9981 Dependence on supplemental oxygen: Secondary | ICD-10-CM | POA: Diagnosis not present

## 2016-08-23 DIAGNOSIS — N183 Chronic kidney disease, stage 3 (moderate): Secondary | ICD-10-CM | POA: Diagnosis not present

## 2016-08-26 ENCOUNTER — Inpatient Hospital Stay

## 2016-08-26 ENCOUNTER — Inpatient Hospital Stay: Attending: Oncology

## 2016-08-26 DIAGNOSIS — D631 Anemia in chronic kidney disease: Secondary | ICD-10-CM | POA: Insufficient documentation

## 2016-08-26 DIAGNOSIS — N183 Chronic kidney disease, stage 3 (moderate): Secondary | ICD-10-CM | POA: Diagnosis not present

## 2016-08-26 DIAGNOSIS — Z7982 Long term (current) use of aspirin: Secondary | ICD-10-CM | POA: Insufficient documentation

## 2016-08-26 DIAGNOSIS — Z9981 Dependence on supplemental oxygen: Secondary | ICD-10-CM | POA: Diagnosis not present

## 2016-08-26 DIAGNOSIS — E1122 Type 2 diabetes mellitus with diabetic chronic kidney disease: Secondary | ICD-10-CM | POA: Insufficient documentation

## 2016-08-26 DIAGNOSIS — Z79899 Other long term (current) drug therapy: Secondary | ICD-10-CM | POA: Diagnosis not present

## 2016-08-26 DIAGNOSIS — I130 Hypertensive heart and chronic kidney disease with heart failure and stage 1 through stage 4 chronic kidney disease, or unspecified chronic kidney disease: Secondary | ICD-10-CM | POA: Diagnosis not present

## 2016-08-26 DIAGNOSIS — I482 Chronic atrial fibrillation: Secondary | ICD-10-CM | POA: Insufficient documentation

## 2016-08-26 DIAGNOSIS — K219 Gastro-esophageal reflux disease without esophagitis: Secondary | ICD-10-CM | POA: Diagnosis not present

## 2016-08-26 DIAGNOSIS — J449 Chronic obstructive pulmonary disease, unspecified: Secondary | ICD-10-CM | POA: Diagnosis not present

## 2016-08-26 DIAGNOSIS — I5042 Chronic combined systolic (congestive) and diastolic (congestive) heart failure: Secondary | ICD-10-CM | POA: Insufficient documentation

## 2016-08-26 DIAGNOSIS — I700 Atherosclerosis of aorta: Secondary | ICD-10-CM | POA: Insufficient documentation

## 2016-08-26 DIAGNOSIS — J9611 Chronic respiratory failure with hypoxia: Secondary | ICD-10-CM | POA: Diagnosis not present

## 2016-08-26 DIAGNOSIS — R590 Localized enlarged lymph nodes: Secondary | ICD-10-CM | POA: Insufficient documentation

## 2016-08-26 DIAGNOSIS — E785 Hyperlipidemia, unspecified: Secondary | ICD-10-CM | POA: Diagnosis not present

## 2016-08-26 DIAGNOSIS — D509 Iron deficiency anemia, unspecified: Secondary | ICD-10-CM

## 2016-08-26 DIAGNOSIS — Z87891 Personal history of nicotine dependence: Secondary | ICD-10-CM | POA: Insufficient documentation

## 2016-08-26 DIAGNOSIS — Z794 Long term (current) use of insulin: Secondary | ICD-10-CM | POA: Diagnosis not present

## 2016-08-26 LAB — HEMOGLOBIN: HEMOGLOBIN: 11 g/dL — AB (ref 13.0–18.0)

## 2016-08-27 DIAGNOSIS — J9611 Chronic respiratory failure with hypoxia: Secondary | ICD-10-CM | POA: Diagnosis not present

## 2016-08-27 DIAGNOSIS — E1122 Type 2 diabetes mellitus with diabetic chronic kidney disease: Secondary | ICD-10-CM | POA: Diagnosis not present

## 2016-08-27 DIAGNOSIS — I130 Hypertensive heart and chronic kidney disease with heart failure and stage 1 through stage 4 chronic kidney disease, or unspecified chronic kidney disease: Secondary | ICD-10-CM | POA: Diagnosis not present

## 2016-08-27 DIAGNOSIS — J449 Chronic obstructive pulmonary disease, unspecified: Secondary | ICD-10-CM | POA: Diagnosis not present

## 2016-08-27 DIAGNOSIS — I5042 Chronic combined systolic (congestive) and diastolic (congestive) heart failure: Secondary | ICD-10-CM | POA: Diagnosis not present

## 2016-08-27 DIAGNOSIS — N183 Chronic kidney disease, stage 3 (moderate): Secondary | ICD-10-CM | POA: Diagnosis not present

## 2016-08-28 DIAGNOSIS — J449 Chronic obstructive pulmonary disease, unspecified: Secondary | ICD-10-CM | POA: Diagnosis not present

## 2016-08-28 DIAGNOSIS — I130 Hypertensive heart and chronic kidney disease with heart failure and stage 1 through stage 4 chronic kidney disease, or unspecified chronic kidney disease: Secondary | ICD-10-CM | POA: Diagnosis not present

## 2016-08-28 DIAGNOSIS — I5042 Chronic combined systolic (congestive) and diastolic (congestive) heart failure: Secondary | ICD-10-CM | POA: Diagnosis not present

## 2016-08-28 DIAGNOSIS — N183 Chronic kidney disease, stage 3 (moderate): Secondary | ICD-10-CM | POA: Diagnosis not present

## 2016-08-28 DIAGNOSIS — J9611 Chronic respiratory failure with hypoxia: Secondary | ICD-10-CM | POA: Diagnosis not present

## 2016-08-28 DIAGNOSIS — E1122 Type 2 diabetes mellitus with diabetic chronic kidney disease: Secondary | ICD-10-CM | POA: Diagnosis not present

## 2016-08-29 DIAGNOSIS — E1122 Type 2 diabetes mellitus with diabetic chronic kidney disease: Secondary | ICD-10-CM | POA: Diagnosis not present

## 2016-08-29 DIAGNOSIS — J449 Chronic obstructive pulmonary disease, unspecified: Secondary | ICD-10-CM | POA: Diagnosis not present

## 2016-08-29 DIAGNOSIS — J9611 Chronic respiratory failure with hypoxia: Secondary | ICD-10-CM | POA: Diagnosis not present

## 2016-08-29 DIAGNOSIS — N183 Chronic kidney disease, stage 3 (moderate): Secondary | ICD-10-CM | POA: Diagnosis not present

## 2016-08-29 DIAGNOSIS — I5042 Chronic combined systolic (congestive) and diastolic (congestive) heart failure: Secondary | ICD-10-CM | POA: Diagnosis not present

## 2016-08-29 DIAGNOSIS — I130 Hypertensive heart and chronic kidney disease with heart failure and stage 1 through stage 4 chronic kidney disease, or unspecified chronic kidney disease: Secondary | ICD-10-CM | POA: Diagnosis not present

## 2016-08-30 DIAGNOSIS — N183 Chronic kidney disease, stage 3 (moderate): Secondary | ICD-10-CM | POA: Diagnosis not present

## 2016-08-30 DIAGNOSIS — I5042 Chronic combined systolic (congestive) and diastolic (congestive) heart failure: Secondary | ICD-10-CM | POA: Diagnosis not present

## 2016-08-30 DIAGNOSIS — J449 Chronic obstructive pulmonary disease, unspecified: Secondary | ICD-10-CM | POA: Diagnosis not present

## 2016-08-30 DIAGNOSIS — J9611 Chronic respiratory failure with hypoxia: Secondary | ICD-10-CM | POA: Diagnosis not present

## 2016-08-30 DIAGNOSIS — I130 Hypertensive heart and chronic kidney disease with heart failure and stage 1 through stage 4 chronic kidney disease, or unspecified chronic kidney disease: Secondary | ICD-10-CM | POA: Diagnosis not present

## 2016-08-30 DIAGNOSIS — E1122 Type 2 diabetes mellitus with diabetic chronic kidney disease: Secondary | ICD-10-CM | POA: Diagnosis not present

## 2016-09-03 DIAGNOSIS — I5042 Chronic combined systolic (congestive) and diastolic (congestive) heart failure: Secondary | ICD-10-CM | POA: Diagnosis not present

## 2016-09-03 DIAGNOSIS — J9611 Chronic respiratory failure with hypoxia: Secondary | ICD-10-CM | POA: Diagnosis not present

## 2016-09-03 DIAGNOSIS — E1122 Type 2 diabetes mellitus with diabetic chronic kidney disease: Secondary | ICD-10-CM | POA: Diagnosis not present

## 2016-09-03 DIAGNOSIS — I130 Hypertensive heart and chronic kidney disease with heart failure and stage 1 through stage 4 chronic kidney disease, or unspecified chronic kidney disease: Secondary | ICD-10-CM | POA: Diagnosis not present

## 2016-09-03 DIAGNOSIS — N183 Chronic kidney disease, stage 3 (moderate): Secondary | ICD-10-CM | POA: Diagnosis not present

## 2016-09-03 DIAGNOSIS — J449 Chronic obstructive pulmonary disease, unspecified: Secondary | ICD-10-CM | POA: Diagnosis not present

## 2016-09-05 ENCOUNTER — Encounter: Payer: Self-pay | Admitting: Family

## 2016-09-05 ENCOUNTER — Ambulatory Visit: Attending: Family | Admitting: Family

## 2016-09-05 VITALS — BP 129/78 | HR 63 | Resp 20 | Ht 67.0 in | Wt 196.0 lb

## 2016-09-05 DIAGNOSIS — J449 Chronic obstructive pulmonary disease, unspecified: Secondary | ICD-10-CM | POA: Diagnosis not present

## 2016-09-05 DIAGNOSIS — Z87891 Personal history of nicotine dependence: Secondary | ICD-10-CM | POA: Insufficient documentation

## 2016-09-05 DIAGNOSIS — D649 Anemia, unspecified: Secondary | ICD-10-CM | POA: Insufficient documentation

## 2016-09-05 DIAGNOSIS — I5042 Chronic combined systolic (congestive) and diastolic (congestive) heart failure: Secondary | ICD-10-CM | POA: Diagnosis not present

## 2016-09-05 DIAGNOSIS — Z794 Long term (current) use of insulin: Secondary | ICD-10-CM

## 2016-09-05 DIAGNOSIS — I130 Hypertensive heart and chronic kidney disease with heart failure and stage 1 through stage 4 chronic kidney disease, or unspecified chronic kidney disease: Secondary | ICD-10-CM | POA: Diagnosis not present

## 2016-09-05 DIAGNOSIS — R5382 Chronic fatigue, unspecified: Secondary | ICD-10-CM

## 2016-09-05 DIAGNOSIS — N183 Chronic kidney disease, stage 3 unspecified: Secondary | ICD-10-CM

## 2016-09-05 DIAGNOSIS — I5032 Chronic diastolic (congestive) heart failure: Secondary | ICD-10-CM | POA: Diagnosis not present

## 2016-09-05 DIAGNOSIS — I481 Persistent atrial fibrillation: Secondary | ICD-10-CM | POA: Insufficient documentation

## 2016-09-05 DIAGNOSIS — I10 Essential (primary) hypertension: Secondary | ICD-10-CM

## 2016-09-05 DIAGNOSIS — E785 Hyperlipidemia, unspecified: Secondary | ICD-10-CM | POA: Diagnosis not present

## 2016-09-05 DIAGNOSIS — E1159 Type 2 diabetes mellitus with other circulatory complications: Secondary | ICD-10-CM

## 2016-09-05 DIAGNOSIS — R5383 Other fatigue: Secondary | ICD-10-CM | POA: Insufficient documentation

## 2016-09-05 DIAGNOSIS — E1122 Type 2 diabetes mellitus with diabetic chronic kidney disease: Secondary | ICD-10-CM | POA: Insufficient documentation

## 2016-09-05 DIAGNOSIS — J9611 Chronic respiratory failure with hypoxia: Secondary | ICD-10-CM | POA: Diagnosis not present

## 2016-09-05 NOTE — Progress Notes (Signed)
Patient ID: Patrick [name], male    DOB: [birth year], 83 y.o.   MRN: [id number]  HPI  Patrick [name] is a 83 y/o male with a history of chronic kidney disease (stage III-IV), diabetes on insulin, HTN, persistent atrial fibrillation, hyperlipidemia, anemia, remote tobacco use and chronic diastolic heart failure.  Last echo done 01/22/16 which showed an EF of 60-65%without any aortic stenosis and trivial tricuspid regurgitation. Elevated PA pressure of 70 mm Hg. This is an improvement from his previous echocardiogram which showed an EF of 40-45%.  ED visit on 08/21/16 for chest pain and was imaged. Admitted 06/30/16 due to pneumonia and HF exacerbation. Initially required bipap and then transitioned back to nasal canula oxygen at 3L. Needed 1 unit of PRBC's due to anemia. Discharged home with hospice services after 2 days. Admitted on 04/02/16 with acute bronchitis. Treated with IV steroids, antibiotics and nebulizers. Discharged home with prednisone. Previous admission was on 01/22/16 for acute on chronic heart failure. Patient was diuresed and cardiology consult obtained. Discharged in 2 days. Also admitted on 12/20/15 with exacerbation of heart failure. Was IV diuresed, was seen by cardiology and discharged home. Torsemide ordered for every other day.   Returns today for a follow-up visit with a chief complaint of chronic fatigue. Wife says that sertraline and clonazepam make him really fatigued. He has edema in his legs. Continues to wear oxygen at 3 L around the clock. Has hospice nurse coming out twice a week.   Past Medical History:  Diagnosis Date  . Amputated finger   . Anemia of chronic disease    a. plan of oncology to start Procrit - received during admission 12/2013  . Atypical chest pain    a. 12/2014 Neg CE - in setting of L pleural effusion.  . Bell's palsy   . Chronic combined systolic and diastolic CHF (congestive heart failure) ([place])    a. 11/2012 Echo: EF 60-65%, mod conc LVH,  mildly dil LA/RA, mild Ao sclerosis w/o stenosis; b. 11/2014 Echo: EF 40-45%, prob mid-apicalanteroseptal, ant, apical HK, Gr2 DD, mod dil LA, mildly dil RA (technically difficult study); c. echo 11/2015: EF 60-65%, trivial AI, nl RV sys fxan, PASP 59; d. echo 10/17: EF 60-65%, no RWMA, mildly dilated LA, mildly reduced RV sys fxn, PASP 70  . CKD (chronic kidney disease), stage III    a. stage III-IV  . Diabetes mellitus without complication ([place])   . GERD (gastroesophageal reflux disease)   . History of gastroesophageal reflux (GERD)   . Hyperlipidemia   . Hypertension   . Permanent atrial fibrillation ([place])    a. Dx 12/2011, Rate-controlled, chronic Xarelto (renal dosing) - CHA2DS2VASc = 5.  . Pleural effusion, left    a. 12/2014 s/p thoracentesis - protein <3, LDH 123, no malignancy.   Past Surgical History:  Procedure Laterality Date  . APPENDECTOMY    . COLONOSCOPY  09/2011  . ESOPHAGOGASTRODUODENOSCOPY    . FINGER SURGERY     right hand   Family History  Problem Relation Age of Onset  . Heart attack Brother   . Diabetes Mother   . Diabetes Sister   . Hypertension Unknown    Social History  Substance Use Topics  . Smoking status: Former Smoker    Packs/day: 0.25    Years: 10.00    Types: Cigars    Quit date: 05/06/1966  . Smokeless tobacco: Never Used  . Alcohol use No   No Known Allergies  Prior to Admission  medications   Medication Sig Start Date End Date Taking? Authorizing Provider  aspirin 81 MG tablet Take 81 mg by mouth daily.   Yes [provider]  benzonatate (TESSALON) 200 MG capsule Take 1 capsule (200 mg total) by mouth 3 (three) times daily as needed for cough. 07/02/16  Yes Gladstone Lighter, MD  cloNIDine (CATAPRES) 0.1 MG tablet Take 1 tablet (0.1 mg total) by mouth 2 (two) times daily. 04/15/16  Yes Hackney, Otila Kluver A, FNP  docusate sodium (COLACE) 100 MG capsule Take 100 mg by mouth daily.   Yes [provider]  insulin NPH-regular Human  (NOVOLIN 70/30) (70-30) 100 UNIT/ML injection Inject 35 Units into the skin 2 (two) times daily with a meal. 07/02/16  Yes Gladstone Lighter, MD  losartan (COZAAR) 50 MG tablet Take 1 tablet (50 mg total) by mouth daily. 07/02/16  Yes Gladstone Lighter, MD  morphine (ROXANOL) 20 MG/ML concentrated solution Take 0.25 mLs (5 mg total) by mouth every 4 (four) hours as needed for severe pain or shortness of breath. 07/02/16  Yes Gladstone Lighter, MD  omeprazole (PRILOSEC) 40 MG capsule Take 1 capsule (40 mg total) by mouth daily. 04/15/16  Yes Hackney, Tina A, FNP  OXYGEN Inhale 2-4 L into the lungs continuous.   Yes [provider]  potassium chloride (K-DUR) 10 MEQ tablet Take 1 tablet (10 mEq total) by mouth 2 (two) times daily. 07/18/16 10/16/16 Yes Gollan, Kathlene November, MD  pravastatin (PRAVACHOL) 40 MG tablet Take 40 mg by mouth at bedtime.    Yes [provider]  predniSONE (DELTASONE) 5 MG tablet Take 10 mg by mouth daily with breakfast.    Yes [provider]  torsemide (DEMADEX) 20 MG tablet Take 2 tablets (40 mg total) by mouth 2 (two) times daily as needed. Patient taking differently: Take 20 mg by mouth 2 (two) times daily as needed.  07/18/16  Yes Minna Merritts, MD    Review of Systems  Constitutional: Positive for fatigue. Negative for appetite change.  HENT: Negative for congestion, postnasal drip and sore throat.   Eyes: Negative.   Respiratory: Negative for cough, chest tightness and shortness of breath.   Cardiovascular: Positive for leg swelling. Negative for chest pain and palpitations.  Gastrointestinal: Negative for abdominal distention and abdominal pain.  Endocrine: Negative.   Genitourinary: Negative.   Musculoskeletal: Negative for back pain and neck pain.  Skin: Negative.   Allergic/Immunologic: Negative.   Neurological: Negative for dizziness and light-headedness.  Hematological: Negative for adenopathy. Does not bruise/bleed easily.   Psychiatric/Behavioral: Negative for dysphoric mood and sleep disturbance (wearing oxygen at 3-4L around the clock). The patient is not nervous/anxious.    Vitals:   09/05/16 1253  BP: 129/78  Pulse: 63  Resp: 20  SpO2: 100%  Weight: 196 lb (88.9 kg)  Height: 5\' 7"  (1.702 m)   Wt Readings from Last 3 Encounters:  09/05/16 196 lb (88.9 kg)  08/21/16 182 lb (82.6 kg)  08/13/16 182 lb 12 oz (82.9 kg)    Lab Results  Component Value Date   CREATININE 1.76 (H) 08/21/2016   CREATININE 1.51 (H) 08/13/2016   CREATININE 2.07 (H) 07/02/2016   Physical Exam  Constitutional: He is oriented to person, place, and time. He appears well-developed and well-nourished.  HENT:  Head: Normocephalic and atraumatic.  Neck: Normal range of motion. Neck supple. No JVD present.  Cardiovascular: Normal rate and regular rhythm.   Pulmonary/Chest: Effort normal. He has no wheezes. He has  no rales.  Abdominal: Soft. He exhibits no distension. There is no tenderness.  Musculoskeletal: He exhibits edema (2+ pitting edema in bilateral lower legs). He exhibits no tenderness.  Neurological: He is alert and oriented to person, place, and time.  Skin: Skin is warm and dry.  Psychiatric: He has a normal mood and affect. His behavior is normal. Thought content normal.  Nursing note and vitals reviewed.     Assessment & Plan:  1: Chronic heart failure with preserved ejection fraction-  - NYHA class III - mildly fluid overloaded today with increased edema in lower legs.  - Continue daily weights and call for an overnight weight gain of >2 pounds or weekly weight gain of >5 pounds. Weight up 14 pounds since last visit.  - elevate legs when sitting at home as much as possible - confirmed with wife that he is taking 40mg  torsemide BID for wt> 198 pounds. Wt >193 pounds, he takes 40mg  torsemide AM and weight <193 pounds, take 20mg  torsemide AM; taking extra potassium as well. Wife previously concerned about renal  function - not adding salt to his food - last saw cardiologist [name]) on 07/18/16 - has home health nurse  - saw pulmonologist Alva Garnet) 05/17/16  2: HTN- - Blood pressure looks good - nurses at hospice are monitoring blood pressure twice a week  3: CKD, stage III-IV- -Follows closely with nephrology  -Remains uninterested in dialysis - sees nephrologist June 2018  4: Diabetes- - blood sugar in clinic 182 - checking blood sugar twice a day  - using insulin about twice a week when sugar is over 150  5: Fatigue-  - patient sleeping while in the room - spoke with wife about giving clonazepam and sertraline at night; if he is anxious and needs to take clonazepam before bedtime to give half a tablet  Reviewed medication list with wife.   Return in 3 months or sooner if questions or concerns arise.   Loree Fee, PharmD 09/05/2016 1:52 PM

## 2016-09-05 NOTE — Patient Instructions (Signed)
Continue weighing daily and call for an overnight weight gain of > 2 pounds or a weekly weight gain of >5 pounds.  Take an extra fluid pill today due to edema in the lower legs.

## 2016-09-10 DIAGNOSIS — I130 Hypertensive heart and chronic kidney disease with heart failure and stage 1 through stage 4 chronic kidney disease, or unspecified chronic kidney disease: Secondary | ICD-10-CM | POA: Diagnosis not present

## 2016-09-10 DIAGNOSIS — J449 Chronic obstructive pulmonary disease, unspecified: Secondary | ICD-10-CM | POA: Diagnosis not present

## 2016-09-10 DIAGNOSIS — N183 Chronic kidney disease, stage 3 (moderate): Secondary | ICD-10-CM | POA: Diagnosis not present

## 2016-09-10 DIAGNOSIS — J9611 Chronic respiratory failure with hypoxia: Secondary | ICD-10-CM | POA: Diagnosis not present

## 2016-09-10 DIAGNOSIS — I5042 Chronic combined systolic (congestive) and diastolic (congestive) heart failure: Secondary | ICD-10-CM | POA: Diagnosis not present

## 2016-09-10 DIAGNOSIS — E1122 Type 2 diabetes mellitus with diabetic chronic kidney disease: Secondary | ICD-10-CM | POA: Diagnosis not present

## 2016-09-12 DIAGNOSIS — J9611 Chronic respiratory failure with hypoxia: Secondary | ICD-10-CM | POA: Diagnosis not present

## 2016-09-12 DIAGNOSIS — J449 Chronic obstructive pulmonary disease, unspecified: Secondary | ICD-10-CM | POA: Diagnosis not present

## 2016-09-12 DIAGNOSIS — I130 Hypertensive heart and chronic kidney disease with heart failure and stage 1 through stage 4 chronic kidney disease, or unspecified chronic kidney disease: Secondary | ICD-10-CM | POA: Diagnosis not present

## 2016-09-12 DIAGNOSIS — E1122 Type 2 diabetes mellitus with diabetic chronic kidney disease: Secondary | ICD-10-CM | POA: Diagnosis not present

## 2016-09-12 DIAGNOSIS — I5042 Chronic combined systolic (congestive) and diastolic (congestive) heart failure: Secondary | ICD-10-CM | POA: Diagnosis not present

## 2016-09-12 DIAGNOSIS — N183 Chronic kidney disease, stage 3 (moderate): Secondary | ICD-10-CM | POA: Diagnosis not present

## 2016-09-16 DIAGNOSIS — I5042 Chronic combined systolic (congestive) and diastolic (congestive) heart failure: Secondary | ICD-10-CM | POA: Diagnosis not present

## 2016-09-16 DIAGNOSIS — N183 Chronic kidney disease, stage 3 (moderate): Secondary | ICD-10-CM | POA: Diagnosis not present

## 2016-09-16 DIAGNOSIS — J449 Chronic obstructive pulmonary disease, unspecified: Secondary | ICD-10-CM | POA: Diagnosis not present

## 2016-09-16 DIAGNOSIS — I130 Hypertensive heart and chronic kidney disease with heart failure and stage 1 through stage 4 chronic kidney disease, or unspecified chronic kidney disease: Secondary | ICD-10-CM | POA: Diagnosis not present

## 2016-09-16 DIAGNOSIS — J9611 Chronic respiratory failure with hypoxia: Secondary | ICD-10-CM | POA: Diagnosis not present

## 2016-09-16 DIAGNOSIS — E1122 Type 2 diabetes mellitus with diabetic chronic kidney disease: Secondary | ICD-10-CM | POA: Diagnosis not present

## 2016-09-17 DIAGNOSIS — J9611 Chronic respiratory failure with hypoxia: Secondary | ICD-10-CM | POA: Diagnosis not present

## 2016-09-17 DIAGNOSIS — E1122 Type 2 diabetes mellitus with diabetic chronic kidney disease: Secondary | ICD-10-CM | POA: Diagnosis not present

## 2016-09-17 DIAGNOSIS — N184 Chronic kidney disease, stage 4 (severe): Secondary | ICD-10-CM | POA: Diagnosis not present

## 2016-09-17 DIAGNOSIS — J449 Chronic obstructive pulmonary disease, unspecified: Secondary | ICD-10-CM | POA: Diagnosis not present

## 2016-09-17 DIAGNOSIS — I5042 Chronic combined systolic (congestive) and diastolic (congestive) heart failure: Secondary | ICD-10-CM | POA: Diagnosis not present

## 2016-09-17 DIAGNOSIS — I130 Hypertensive heart and chronic kidney disease with heart failure and stage 1 through stage 4 chronic kidney disease, or unspecified chronic kidney disease: Secondary | ICD-10-CM | POA: Diagnosis not present

## 2016-09-17 DIAGNOSIS — D631 Anemia in chronic kidney disease: Secondary | ICD-10-CM | POA: Diagnosis not present

## 2016-09-17 DIAGNOSIS — N183 Chronic kidney disease, stage 3 (moderate): Secondary | ICD-10-CM | POA: Diagnosis not present

## 2016-09-17 DIAGNOSIS — I129 Hypertensive chronic kidney disease with stage 1 through stage 4 chronic kidney disease, or unspecified chronic kidney disease: Secondary | ICD-10-CM | POA: Diagnosis not present

## 2016-09-19 DIAGNOSIS — E1122 Type 2 diabetes mellitus with diabetic chronic kidney disease: Secondary | ICD-10-CM | POA: Diagnosis not present

## 2016-09-19 DIAGNOSIS — I5042 Chronic combined systolic (congestive) and diastolic (congestive) heart failure: Secondary | ICD-10-CM | POA: Diagnosis not present

## 2016-09-19 DIAGNOSIS — N183 Chronic kidney disease, stage 3 (moderate): Secondary | ICD-10-CM | POA: Diagnosis not present

## 2016-09-19 DIAGNOSIS — I130 Hypertensive heart and chronic kidney disease with heart failure and stage 1 through stage 4 chronic kidney disease, or unspecified chronic kidney disease: Secondary | ICD-10-CM | POA: Diagnosis not present

## 2016-09-19 DIAGNOSIS — J9611 Chronic respiratory failure with hypoxia: Secondary | ICD-10-CM | POA: Diagnosis not present

## 2016-09-19 DIAGNOSIS — J449 Chronic obstructive pulmonary disease, unspecified: Secondary | ICD-10-CM | POA: Diagnosis not present

## 2016-09-22 DIAGNOSIS — N183 Chronic kidney disease, stage 3 (moderate): Secondary | ICD-10-CM | POA: Diagnosis not present

## 2016-09-22 DIAGNOSIS — I5042 Chronic combined systolic (congestive) and diastolic (congestive) heart failure: Secondary | ICD-10-CM | POA: Diagnosis not present

## 2016-09-22 DIAGNOSIS — Z794 Long term (current) use of insulin: Secondary | ICD-10-CM | POA: Diagnosis not present

## 2016-09-22 DIAGNOSIS — I130 Hypertensive heart and chronic kidney disease with heart failure and stage 1 through stage 4 chronic kidney disease, or unspecified chronic kidney disease: Secondary | ICD-10-CM | POA: Diagnosis not present

## 2016-09-22 DIAGNOSIS — R634 Abnormal weight loss: Secondary | ICD-10-CM | POA: Diagnosis not present

## 2016-09-22 DIAGNOSIS — E1122 Type 2 diabetes mellitus with diabetic chronic kidney disease: Secondary | ICD-10-CM | POA: Diagnosis not present

## 2016-09-22 DIAGNOSIS — J8410 Pulmonary fibrosis, unspecified: Secondary | ICD-10-CM | POA: Diagnosis not present

## 2016-09-22 DIAGNOSIS — I481 Persistent atrial fibrillation: Secondary | ICD-10-CM | POA: Diagnosis not present

## 2016-09-22 DIAGNOSIS — J9611 Chronic respiratory failure with hypoxia: Secondary | ICD-10-CM | POA: Diagnosis not present

## 2016-09-22 DIAGNOSIS — J449 Chronic obstructive pulmonary disease, unspecified: Secondary | ICD-10-CM | POA: Diagnosis not present

## 2016-09-22 DIAGNOSIS — Z9981 Dependence on supplemental oxygen: Secondary | ICD-10-CM | POA: Diagnosis not present

## 2016-09-22 DIAGNOSIS — I272 Pulmonary hypertension, unspecified: Secondary | ICD-10-CM | POA: Diagnosis not present

## 2016-09-23 ENCOUNTER — Inpatient Hospital Stay

## 2016-09-23 ENCOUNTER — Ambulatory Visit: Payer: Medicare HMO | Admitting: Family

## 2016-09-23 ENCOUNTER — Inpatient Hospital Stay: Attending: Oncology

## 2016-09-23 VITALS — BP 144/80 | HR 75 | Temp 98.1°F | Resp 20

## 2016-09-23 DIAGNOSIS — I5042 Chronic combined systolic (congestive) and diastolic (congestive) heart failure: Secondary | ICD-10-CM | POA: Insufficient documentation

## 2016-09-23 DIAGNOSIS — N183 Chronic kidney disease, stage 3 unspecified: Secondary | ICD-10-CM

## 2016-09-23 DIAGNOSIS — D631 Anemia in chronic kidney disease: Secondary | ICD-10-CM | POA: Diagnosis not present

## 2016-09-23 DIAGNOSIS — I482 Chronic atrial fibrillation: Secondary | ICD-10-CM | POA: Insufficient documentation

## 2016-09-23 DIAGNOSIS — Z7982 Long term (current) use of aspirin: Secondary | ICD-10-CM | POA: Insufficient documentation

## 2016-09-23 DIAGNOSIS — Z87891 Personal history of nicotine dependence: Secondary | ICD-10-CM | POA: Insufficient documentation

## 2016-09-23 DIAGNOSIS — Z794 Long term (current) use of insulin: Secondary | ICD-10-CM | POA: Diagnosis not present

## 2016-09-23 DIAGNOSIS — I130 Hypertensive heart and chronic kidney disease with heart failure and stage 1 through stage 4 chronic kidney disease, or unspecified chronic kidney disease: Secondary | ICD-10-CM | POA: Insufficient documentation

## 2016-09-23 DIAGNOSIS — E785 Hyperlipidemia, unspecified: Secondary | ICD-10-CM | POA: Diagnosis not present

## 2016-09-23 DIAGNOSIS — Z9981 Dependence on supplemental oxygen: Secondary | ICD-10-CM | POA: Diagnosis not present

## 2016-09-23 DIAGNOSIS — D638 Anemia in other chronic diseases classified elsewhere: Secondary | ICD-10-CM

## 2016-09-23 DIAGNOSIS — Z79899 Other long term (current) drug therapy: Secondary | ICD-10-CM | POA: Diagnosis not present

## 2016-09-23 DIAGNOSIS — K219 Gastro-esophageal reflux disease without esophagitis: Secondary | ICD-10-CM | POA: Insufficient documentation

## 2016-09-23 DIAGNOSIS — R590 Localized enlarged lymph nodes: Secondary | ICD-10-CM | POA: Diagnosis not present

## 2016-09-23 DIAGNOSIS — I700 Atherosclerosis of aorta: Secondary | ICD-10-CM | POA: Insufficient documentation

## 2016-09-23 DIAGNOSIS — E1122 Type 2 diabetes mellitus with diabetic chronic kidney disease: Secondary | ICD-10-CM | POA: Insufficient documentation

## 2016-09-23 DIAGNOSIS — D509 Iron deficiency anemia, unspecified: Secondary | ICD-10-CM

## 2016-09-23 LAB — HEMOGLOBIN: Hemoglobin: 10.5 g/dL — ABNORMAL LOW (ref 13.0–18.0)

## 2016-09-23 MED ORDER — EPOETIN ALFA 40000 UNIT/ML IJ SOLN
40000.0000 [IU] | Freq: Once | INTRAMUSCULAR | Status: AC
  Administered 2016-09-23: 40000 [IU] via SUBCUTANEOUS

## 2016-09-23 NOTE — Patient Instructions (Signed)
Epoetin Alfa injection °What is this medicine? °EPOETIN ALFA (e POE e tin AL fa) helps your body make more red blood cells. This medicine is used to treat anemia caused by chronic kidney failure, cancer chemotherapy, or HIV-therapy. It may also be used before surgery if you have anemia. °This medicine may be used for other purposes; ask your health care provider or pharmacist if you have questions. °COMMON BRAND NAME(S): Epogen, Procrit °What should I tell my health care provider before I take this medicine? °They need to know if you have any of these conditions: °-blood clotting disorders °-cancer patient not on chemotherapy °-cystic fibrosis °-heart disease, such as angina or heart failure °-hemoglobin level of 12 g/dL or greater °-high blood pressure °-low levels of folate, iron, or vitamin B12 °-seizures °-an unusual or allergic reaction to erythropoietin, albumin, benzyl alcohol, hamster proteins, other medicines, foods, dyes, or preservatives °-pregnant or trying to get pregnant °-breast-feeding °How should I use this medicine? °This medicine is for injection into a vein or under the skin. It is usually given by a health care professional in a hospital or clinic setting. °If you get this medicine at home, you will be taught how to prepare and give this medicine. Use exactly as directed. Take your medicine at regular intervals. Do not take your medicine more often than directed. °It is important that you put your used needles and syringes in a special sharps container. Do not put them in a trash can. If you do not have a sharps container, call your pharmacist or healthcare provider to get one. °A special MedGuide will be given to you by the pharmacist with each prescription and refill. Be sure to read this information carefully each time. °Talk to your pediatrician regarding the use of this medicine in children. While this drug may be prescribed for selected conditions, precautions do apply. °Overdosage: If you  think you have taken too much of this medicine contact a poison control center or emergency room at once. °NOTE: This medicine is only for you. Do not share this medicine with others. °What if I miss a dose? °If you miss a dose, take it as soon as you can. If it is almost time for your next dose, take only that dose. Do not take double or extra doses. °What may interact with this medicine? °Do not take this medicine with any of the following medications: °-darbepoetin alfa °This list may not describe all possible interactions. Give your health care provider a list of all the medicines, herbs, non-prescription drugs, or dietary supplements you use. Also tell them if you smoke, drink alcohol, or use illegal drugs. Some items may interact with your medicine. °What should I watch for while using this medicine? °Your condition will be monitored carefully while you are receiving this medicine. °You may need blood work done while you are taking this medicine. °What side effects may I notice from receiving this medicine? °Side effects that you should report to your doctor or health care professional as soon as possible: °-allergic reactions like skin rash, itching or hives, swelling of the face, lips, or tongue °-breathing problems °-changes in vision °-chest pain °-confusion, trouble speaking or understanding °-feeling faint or lightheaded, falls °-high blood pressure °-muscle aches or pains °-pain, swelling, warmth in the leg °-rapid weight gain °-severe headaches °-sudden numbness or weakness of the face, arm or leg °-trouble walking, dizziness, loss of balance or coordination °-seizures (convulsions) °-swelling of the ankles, feet, hands °-unusually weak or tired °  Side effects that usually do not require medical attention (report to your doctor or health care professional if they continue or are bothersome): °-diarrhea °-fever, chills (flu-like symptoms) °-headaches °-nausea, vomiting °-redness, stinging, or swelling at  site where injected °This list may not describe all possible side effects. Call your doctor for medical advice about side effects. You may report side effects to FDA at 1-800-FDA-1088. °Where should I keep my medicine? °Keep out of the reach of children. °Store in a refrigerator between 2 and 8 degrees C (36 and 46 degrees F). Do not freeze or shake. Throw away any unused portion if using a single-dose vial. Multi-dose vials can be kept in the refrigerator for up to 21 days after the initial dose. Throw away unused medicine. °NOTE: This sheet is a summary. It may not cover all possible information. If you have questions about this medicine, talk to your doctor, pharmacist, or health care provider. °© 2018 Elsevier/Gold Standard (2015-10-30 19:42:31) ° °

## 2016-09-24 DIAGNOSIS — N183 Chronic kidney disease, stage 3 (moderate): Secondary | ICD-10-CM | POA: Diagnosis not present

## 2016-09-24 DIAGNOSIS — E1122 Type 2 diabetes mellitus with diabetic chronic kidney disease: Secondary | ICD-10-CM | POA: Diagnosis not present

## 2016-09-24 DIAGNOSIS — I130 Hypertensive heart and chronic kidney disease with heart failure and stage 1 through stage 4 chronic kidney disease, or unspecified chronic kidney disease: Secondary | ICD-10-CM | POA: Diagnosis not present

## 2016-09-24 DIAGNOSIS — I5042 Chronic combined systolic (congestive) and diastolic (congestive) heart failure: Secondary | ICD-10-CM | POA: Diagnosis not present

## 2016-09-24 DIAGNOSIS — J449 Chronic obstructive pulmonary disease, unspecified: Secondary | ICD-10-CM | POA: Diagnosis not present

## 2016-09-24 DIAGNOSIS — J9611 Chronic respiratory failure with hypoxia: Secondary | ICD-10-CM | POA: Diagnosis not present

## 2016-10-01 DIAGNOSIS — N183 Chronic kidney disease, stage 3 (moderate): Secondary | ICD-10-CM | POA: Diagnosis not present

## 2016-10-01 DIAGNOSIS — E1122 Type 2 diabetes mellitus with diabetic chronic kidney disease: Secondary | ICD-10-CM | POA: Diagnosis not present

## 2016-10-01 DIAGNOSIS — J9611 Chronic respiratory failure with hypoxia: Secondary | ICD-10-CM | POA: Diagnosis not present

## 2016-10-01 DIAGNOSIS — I5042 Chronic combined systolic (congestive) and diastolic (congestive) heart failure: Secondary | ICD-10-CM | POA: Diagnosis not present

## 2016-10-01 DIAGNOSIS — I130 Hypertensive heart and chronic kidney disease with heart failure and stage 1 through stage 4 chronic kidney disease, or unspecified chronic kidney disease: Secondary | ICD-10-CM | POA: Diagnosis not present

## 2016-10-01 DIAGNOSIS — J449 Chronic obstructive pulmonary disease, unspecified: Secondary | ICD-10-CM | POA: Diagnosis not present

## 2016-10-03 DIAGNOSIS — E1122 Type 2 diabetes mellitus with diabetic chronic kidney disease: Secondary | ICD-10-CM | POA: Diagnosis not present

## 2016-10-03 DIAGNOSIS — J449 Chronic obstructive pulmonary disease, unspecified: Secondary | ICD-10-CM | POA: Diagnosis not present

## 2016-10-03 DIAGNOSIS — I130 Hypertensive heart and chronic kidney disease with heart failure and stage 1 through stage 4 chronic kidney disease, or unspecified chronic kidney disease: Secondary | ICD-10-CM | POA: Diagnosis not present

## 2016-10-03 DIAGNOSIS — J9611 Chronic respiratory failure with hypoxia: Secondary | ICD-10-CM | POA: Diagnosis not present

## 2016-10-03 DIAGNOSIS — N183 Chronic kidney disease, stage 3 (moderate): Secondary | ICD-10-CM | POA: Diagnosis not present

## 2016-10-03 DIAGNOSIS — I5042 Chronic combined systolic (congestive) and diastolic (congestive) heart failure: Secondary | ICD-10-CM | POA: Diagnosis not present

## 2016-10-08 DIAGNOSIS — I5042 Chronic combined systolic (congestive) and diastolic (congestive) heart failure: Secondary | ICD-10-CM | POA: Diagnosis not present

## 2016-10-08 DIAGNOSIS — J9611 Chronic respiratory failure with hypoxia: Secondary | ICD-10-CM | POA: Diagnosis not present

## 2016-10-08 DIAGNOSIS — I130 Hypertensive heart and chronic kidney disease with heart failure and stage 1 through stage 4 chronic kidney disease, or unspecified chronic kidney disease: Secondary | ICD-10-CM | POA: Diagnosis not present

## 2016-10-08 DIAGNOSIS — N183 Chronic kidney disease, stage 3 (moderate): Secondary | ICD-10-CM | POA: Diagnosis not present

## 2016-10-08 DIAGNOSIS — E1122 Type 2 diabetes mellitus with diabetic chronic kidney disease: Secondary | ICD-10-CM | POA: Diagnosis not present

## 2016-10-08 DIAGNOSIS — J449 Chronic obstructive pulmonary disease, unspecified: Secondary | ICD-10-CM | POA: Diagnosis not present

## 2016-10-10 DIAGNOSIS — N183 Chronic kidney disease, stage 3 (moderate): Secondary | ICD-10-CM | POA: Diagnosis not present

## 2016-10-10 DIAGNOSIS — I5042 Chronic combined systolic (congestive) and diastolic (congestive) heart failure: Secondary | ICD-10-CM | POA: Diagnosis not present

## 2016-10-10 DIAGNOSIS — J9611 Chronic respiratory failure with hypoxia: Secondary | ICD-10-CM | POA: Diagnosis not present

## 2016-10-10 DIAGNOSIS — J449 Chronic obstructive pulmonary disease, unspecified: Secondary | ICD-10-CM | POA: Diagnosis not present

## 2016-10-10 DIAGNOSIS — I130 Hypertensive heart and chronic kidney disease with heart failure and stage 1 through stage 4 chronic kidney disease, or unspecified chronic kidney disease: Secondary | ICD-10-CM | POA: Diagnosis not present

## 2016-10-10 DIAGNOSIS — E1122 Type 2 diabetes mellitus with diabetic chronic kidney disease: Secondary | ICD-10-CM | POA: Diagnosis not present

## 2016-10-14 DIAGNOSIS — I130 Hypertensive heart and chronic kidney disease with heart failure and stage 1 through stage 4 chronic kidney disease, or unspecified chronic kidney disease: Secondary | ICD-10-CM | POA: Diagnosis not present

## 2016-10-14 DIAGNOSIS — J449 Chronic obstructive pulmonary disease, unspecified: Secondary | ICD-10-CM | POA: Diagnosis not present

## 2016-10-14 DIAGNOSIS — J9611 Chronic respiratory failure with hypoxia: Secondary | ICD-10-CM | POA: Diagnosis not present

## 2016-10-14 DIAGNOSIS — E1122 Type 2 diabetes mellitus with diabetic chronic kidney disease: Secondary | ICD-10-CM | POA: Diagnosis not present

## 2016-10-14 DIAGNOSIS — I5042 Chronic combined systolic (congestive) and diastolic (congestive) heart failure: Secondary | ICD-10-CM | POA: Diagnosis not present

## 2016-10-14 DIAGNOSIS — N183 Chronic kidney disease, stage 3 (moderate): Secondary | ICD-10-CM | POA: Diagnosis not present

## 2016-10-15 DIAGNOSIS — J9611 Chronic respiratory failure with hypoxia: Secondary | ICD-10-CM | POA: Diagnosis not present

## 2016-10-15 DIAGNOSIS — J449 Chronic obstructive pulmonary disease, unspecified: Secondary | ICD-10-CM | POA: Diagnosis not present

## 2016-10-15 DIAGNOSIS — E1122 Type 2 diabetes mellitus with diabetic chronic kidney disease: Secondary | ICD-10-CM | POA: Diagnosis not present

## 2016-10-15 DIAGNOSIS — I130 Hypertensive heart and chronic kidney disease with heart failure and stage 1 through stage 4 chronic kidney disease, or unspecified chronic kidney disease: Secondary | ICD-10-CM | POA: Diagnosis not present

## 2016-10-15 DIAGNOSIS — I5042 Chronic combined systolic (congestive) and diastolic (congestive) heart failure: Secondary | ICD-10-CM | POA: Diagnosis not present

## 2016-10-15 DIAGNOSIS — N183 Chronic kidney disease, stage 3 (moderate): Secondary | ICD-10-CM | POA: Diagnosis not present

## 2016-10-17 DIAGNOSIS — E1122 Type 2 diabetes mellitus with diabetic chronic kidney disease: Secondary | ICD-10-CM | POA: Diagnosis not present

## 2016-10-17 DIAGNOSIS — J449 Chronic obstructive pulmonary disease, unspecified: Secondary | ICD-10-CM | POA: Diagnosis not present

## 2016-10-17 DIAGNOSIS — I5042 Chronic combined systolic (congestive) and diastolic (congestive) heart failure: Secondary | ICD-10-CM | POA: Diagnosis not present

## 2016-10-17 DIAGNOSIS — I130 Hypertensive heart and chronic kidney disease with heart failure and stage 1 through stage 4 chronic kidney disease, or unspecified chronic kidney disease: Secondary | ICD-10-CM | POA: Diagnosis not present

## 2016-10-17 DIAGNOSIS — J9611 Chronic respiratory failure with hypoxia: Secondary | ICD-10-CM | POA: Diagnosis not present

## 2016-10-17 DIAGNOSIS — N183 Chronic kidney disease, stage 3 (moderate): Secondary | ICD-10-CM | POA: Diagnosis not present

## 2016-10-21 ENCOUNTER — Inpatient Hospital Stay

## 2016-10-21 DIAGNOSIS — D631 Anemia in chronic kidney disease: Secondary | ICD-10-CM

## 2016-10-21 DIAGNOSIS — K219 Gastro-esophageal reflux disease without esophagitis: Secondary | ICD-10-CM | POA: Diagnosis not present

## 2016-10-21 DIAGNOSIS — N183 Chronic kidney disease, stage 3 (moderate): Secondary | ICD-10-CM

## 2016-10-21 DIAGNOSIS — D509 Iron deficiency anemia, unspecified: Secondary | ICD-10-CM

## 2016-10-21 DIAGNOSIS — E1122 Type 2 diabetes mellitus with diabetic chronic kidney disease: Secondary | ICD-10-CM | POA: Diagnosis not present

## 2016-10-21 DIAGNOSIS — I482 Chronic atrial fibrillation: Secondary | ICD-10-CM | POA: Diagnosis not present

## 2016-10-21 DIAGNOSIS — E785 Hyperlipidemia, unspecified: Secondary | ICD-10-CM | POA: Diagnosis not present

## 2016-10-21 DIAGNOSIS — R590 Localized enlarged lymph nodes: Secondary | ICD-10-CM | POA: Diagnosis not present

## 2016-10-21 DIAGNOSIS — Z79899 Other long term (current) drug therapy: Secondary | ICD-10-CM | POA: Diagnosis not present

## 2016-10-21 DIAGNOSIS — I130 Hypertensive heart and chronic kidney disease with heart failure and stage 1 through stage 4 chronic kidney disease, or unspecified chronic kidney disease: Secondary | ICD-10-CM | POA: Diagnosis not present

## 2016-10-21 LAB — HEMOGLOBIN: Hemoglobin: 11.2 g/dL — ABNORMAL LOW (ref 13.0–18.0)

## 2016-10-22 DIAGNOSIS — J449 Chronic obstructive pulmonary disease, unspecified: Secondary | ICD-10-CM | POA: Diagnosis not present

## 2016-10-22 DIAGNOSIS — N183 Chronic kidney disease, stage 3 (moderate): Secondary | ICD-10-CM | POA: Diagnosis not present

## 2016-10-22 DIAGNOSIS — I5042 Chronic combined systolic (congestive) and diastolic (congestive) heart failure: Secondary | ICD-10-CM | POA: Diagnosis not present

## 2016-10-22 DIAGNOSIS — E1122 Type 2 diabetes mellitus with diabetic chronic kidney disease: Secondary | ICD-10-CM | POA: Diagnosis not present

## 2016-10-22 DIAGNOSIS — I130 Hypertensive heart and chronic kidney disease with heart failure and stage 1 through stage 4 chronic kidney disease, or unspecified chronic kidney disease: Secondary | ICD-10-CM | POA: Diagnosis not present

## 2016-10-22 DIAGNOSIS — J9611 Chronic respiratory failure with hypoxia: Secondary | ICD-10-CM | POA: Diagnosis not present

## 2016-10-23 DIAGNOSIS — E1122 Type 2 diabetes mellitus with diabetic chronic kidney disease: Secondary | ICD-10-CM | POA: Diagnosis not present

## 2016-10-23 DIAGNOSIS — R634 Abnormal weight loss: Secondary | ICD-10-CM | POA: Diagnosis not present

## 2016-10-23 DIAGNOSIS — J449 Chronic obstructive pulmonary disease, unspecified: Secondary | ICD-10-CM | POA: Diagnosis not present

## 2016-10-23 DIAGNOSIS — I481 Persistent atrial fibrillation: Secondary | ICD-10-CM | POA: Diagnosis not present

## 2016-10-23 DIAGNOSIS — I130 Hypertensive heart and chronic kidney disease with heart failure and stage 1 through stage 4 chronic kidney disease, or unspecified chronic kidney disease: Secondary | ICD-10-CM | POA: Diagnosis not present

## 2016-10-23 DIAGNOSIS — Z794 Long term (current) use of insulin: Secondary | ICD-10-CM | POA: Diagnosis not present

## 2016-10-23 DIAGNOSIS — J8410 Pulmonary fibrosis, unspecified: Secondary | ICD-10-CM | POA: Diagnosis not present

## 2016-10-23 DIAGNOSIS — J9611 Chronic respiratory failure with hypoxia: Secondary | ICD-10-CM | POA: Diagnosis not present

## 2016-10-23 DIAGNOSIS — I5042 Chronic combined systolic (congestive) and diastolic (congestive) heart failure: Secondary | ICD-10-CM | POA: Diagnosis not present

## 2016-10-23 DIAGNOSIS — N183 Chronic kidney disease, stage 3 (moderate): Secondary | ICD-10-CM | POA: Diagnosis not present

## 2016-10-23 DIAGNOSIS — I272 Pulmonary hypertension, unspecified: Secondary | ICD-10-CM | POA: Diagnosis not present

## 2016-10-23 DIAGNOSIS — Z9981 Dependence on supplemental oxygen: Secondary | ICD-10-CM | POA: Diagnosis not present

## 2016-10-24 DIAGNOSIS — J449 Chronic obstructive pulmonary disease, unspecified: Secondary | ICD-10-CM | POA: Diagnosis not present

## 2016-10-24 DIAGNOSIS — J9611 Chronic respiratory failure with hypoxia: Secondary | ICD-10-CM | POA: Diagnosis not present

## 2016-10-24 DIAGNOSIS — I130 Hypertensive heart and chronic kidney disease with heart failure and stage 1 through stage 4 chronic kidney disease, or unspecified chronic kidney disease: Secondary | ICD-10-CM | POA: Diagnosis not present

## 2016-10-24 DIAGNOSIS — N183 Chronic kidney disease, stage 3 (moderate): Secondary | ICD-10-CM | POA: Diagnosis not present

## 2016-10-24 DIAGNOSIS — I5042 Chronic combined systolic (congestive) and diastolic (congestive) heart failure: Secondary | ICD-10-CM | POA: Diagnosis not present

## 2016-10-24 DIAGNOSIS — E1122 Type 2 diabetes mellitus with diabetic chronic kidney disease: Secondary | ICD-10-CM | POA: Diagnosis not present

## 2016-10-29 DIAGNOSIS — N183 Chronic kidney disease, stage 3 (moderate): Secondary | ICD-10-CM | POA: Diagnosis not present

## 2016-10-29 DIAGNOSIS — I5042 Chronic combined systolic (congestive) and diastolic (congestive) heart failure: Secondary | ICD-10-CM | POA: Diagnosis not present

## 2016-10-29 DIAGNOSIS — J449 Chronic obstructive pulmonary disease, unspecified: Secondary | ICD-10-CM | POA: Diagnosis not present

## 2016-10-29 DIAGNOSIS — J9611 Chronic respiratory failure with hypoxia: Secondary | ICD-10-CM | POA: Diagnosis not present

## 2016-10-29 DIAGNOSIS — E1122 Type 2 diabetes mellitus with diabetic chronic kidney disease: Secondary | ICD-10-CM | POA: Diagnosis not present

## 2016-10-29 DIAGNOSIS — I130 Hypertensive heart and chronic kidney disease with heart failure and stage 1 through stage 4 chronic kidney disease, or unspecified chronic kidney disease: Secondary | ICD-10-CM | POA: Diagnosis not present

## 2016-10-30 DIAGNOSIS — I130 Hypertensive heart and chronic kidney disease with heart failure and stage 1 through stage 4 chronic kidney disease, or unspecified chronic kidney disease: Secondary | ICD-10-CM | POA: Diagnosis not present

## 2016-10-30 DIAGNOSIS — E1122 Type 2 diabetes mellitus with diabetic chronic kidney disease: Secondary | ICD-10-CM | POA: Diagnosis not present

## 2016-10-30 DIAGNOSIS — I5042 Chronic combined systolic (congestive) and diastolic (congestive) heart failure: Secondary | ICD-10-CM | POA: Diagnosis not present

## 2016-10-30 DIAGNOSIS — I509 Heart failure, unspecified: Secondary | ICD-10-CM | POA: Diagnosis not present

## 2016-10-30 DIAGNOSIS — N183 Chronic kidney disease, stage 3 (moderate): Secondary | ICD-10-CM | POA: Diagnosis not present

## 2016-10-30 DIAGNOSIS — J9611 Chronic respiratory failure with hypoxia: Secondary | ICD-10-CM | POA: Diagnosis not present

## 2016-10-30 DIAGNOSIS — J449 Chronic obstructive pulmonary disease, unspecified: Secondary | ICD-10-CM | POA: Diagnosis not present

## 2016-10-31 DIAGNOSIS — N183 Chronic kidney disease, stage 3 (moderate): Secondary | ICD-10-CM | POA: Diagnosis not present

## 2016-10-31 DIAGNOSIS — I5042 Chronic combined systolic (congestive) and diastolic (congestive) heart failure: Secondary | ICD-10-CM | POA: Diagnosis not present

## 2016-10-31 DIAGNOSIS — I130 Hypertensive heart and chronic kidney disease with heart failure and stage 1 through stage 4 chronic kidney disease, or unspecified chronic kidney disease: Secondary | ICD-10-CM | POA: Diagnosis not present

## 2016-10-31 DIAGNOSIS — E1122 Type 2 diabetes mellitus with diabetic chronic kidney disease: Secondary | ICD-10-CM | POA: Diagnosis not present

## 2016-10-31 DIAGNOSIS — J449 Chronic obstructive pulmonary disease, unspecified: Secondary | ICD-10-CM | POA: Diagnosis not present

## 2016-10-31 DIAGNOSIS — J9611 Chronic respiratory failure with hypoxia: Secondary | ICD-10-CM | POA: Diagnosis not present

## 2016-11-04 DIAGNOSIS — N183 Chronic kidney disease, stage 3 (moderate): Secondary | ICD-10-CM | POA: Diagnosis not present

## 2016-11-04 DIAGNOSIS — E1122 Type 2 diabetes mellitus with diabetic chronic kidney disease: Secondary | ICD-10-CM | POA: Diagnosis not present

## 2016-11-04 DIAGNOSIS — I130 Hypertensive heart and chronic kidney disease with heart failure and stage 1 through stage 4 chronic kidney disease, or unspecified chronic kidney disease: Secondary | ICD-10-CM | POA: Diagnosis not present

## 2016-11-04 DIAGNOSIS — J449 Chronic obstructive pulmonary disease, unspecified: Secondary | ICD-10-CM | POA: Diagnosis not present

## 2016-11-04 DIAGNOSIS — I5042 Chronic combined systolic (congestive) and diastolic (congestive) heart failure: Secondary | ICD-10-CM | POA: Diagnosis not present

## 2016-11-04 DIAGNOSIS — J9611 Chronic respiratory failure with hypoxia: Secondary | ICD-10-CM | POA: Diagnosis not present

## 2016-11-05 DIAGNOSIS — E1122 Type 2 diabetes mellitus with diabetic chronic kidney disease: Secondary | ICD-10-CM | POA: Diagnosis not present

## 2016-11-05 DIAGNOSIS — I130 Hypertensive heart and chronic kidney disease with heart failure and stage 1 through stage 4 chronic kidney disease, or unspecified chronic kidney disease: Secondary | ICD-10-CM | POA: Diagnosis not present

## 2016-11-05 DIAGNOSIS — N183 Chronic kidney disease, stage 3 (moderate): Secondary | ICD-10-CM | POA: Diagnosis not present

## 2016-11-05 DIAGNOSIS — J449 Chronic obstructive pulmonary disease, unspecified: Secondary | ICD-10-CM | POA: Diagnosis not present

## 2016-11-05 DIAGNOSIS — J9611 Chronic respiratory failure with hypoxia: Secondary | ICD-10-CM | POA: Diagnosis not present

## 2016-11-05 DIAGNOSIS — I5042 Chronic combined systolic (congestive) and diastolic (congestive) heart failure: Secondary | ICD-10-CM | POA: Diagnosis not present

## 2016-11-06 DIAGNOSIS — E1122 Type 2 diabetes mellitus with diabetic chronic kidney disease: Secondary | ICD-10-CM | POA: Diagnosis not present

## 2016-11-06 DIAGNOSIS — J449 Chronic obstructive pulmonary disease, unspecified: Secondary | ICD-10-CM | POA: Diagnosis not present

## 2016-11-06 DIAGNOSIS — Z794 Long term (current) use of insulin: Secondary | ICD-10-CM | POA: Diagnosis not present

## 2016-11-06 DIAGNOSIS — J9611 Chronic respiratory failure with hypoxia: Secondary | ICD-10-CM | POA: Diagnosis not present

## 2016-11-06 DIAGNOSIS — R0902 Hypoxemia: Secondary | ICD-10-CM | POA: Diagnosis not present

## 2016-11-06 DIAGNOSIS — N183 Chronic kidney disease, stage 3 (moderate): Secondary | ICD-10-CM | POA: Diagnosis not present

## 2016-11-06 DIAGNOSIS — I5042 Chronic combined systolic (congestive) and diastolic (congestive) heart failure: Secondary | ICD-10-CM | POA: Diagnosis not present

## 2016-11-06 DIAGNOSIS — I130 Hypertensive heart and chronic kidney disease with heart failure and stage 1 through stage 4 chronic kidney disease, or unspecified chronic kidney disease: Secondary | ICD-10-CM | POA: Diagnosis not present

## 2016-11-16 DIAGNOSIS — N183 Chronic kidney disease, stage 3 (moderate): Secondary | ICD-10-CM | POA: Diagnosis not present

## 2016-11-16 DIAGNOSIS — I481 Persistent atrial fibrillation: Secondary | ICD-10-CM | POA: Diagnosis not present

## 2016-11-16 DIAGNOSIS — I5042 Chronic combined systolic (congestive) and diastolic (congestive) heart failure: Secondary | ICD-10-CM | POA: Diagnosis not present

## 2016-11-16 DIAGNOSIS — J449 Chronic obstructive pulmonary disease, unspecified: Secondary | ICD-10-CM | POA: Diagnosis not present

## 2016-11-16 DIAGNOSIS — J9601 Acute respiratory failure with hypoxia: Secondary | ICD-10-CM | POA: Diagnosis not present

## 2016-11-19 NOTE — Progress Notes (Signed)
[place]  [phone] [phone]) [phone]  ID: CHASTIN RIESGO OB: 15-Dec-1931  MR#: [id number]  KGM#:[id number]  Patient Care Team: Maryland Pink, MD as PCP - General (Family Medicine) Alisa Graff, FNP as Nurse Practitioner (Family Medicine) Idolina Primer, Areta Haber, PA-C as Physician Assistant (Cardiology) Lloyd Huger, MD as Consulting Physician (Hematology) Lavonia Dana, MD as Consulting Physician (Nephrology) Gabriel Carina Betsey Holiday, MD as Physician Assistant (Endocrinology)  CHIEF COMPLAINT:  Iron deficiency anemia, anemia secondary to chronic renal failure.  INTERVAL HISTORY: Patient returns to clinic today for further evaluation, laboratory work, and consideration of continuation of Feraheme or Procrit. He has a decreased performance status, but otherwise feels well and is at his baseline. His son reports that he was discharged from hospice care. He also has intermittent left leg numbness that appears to be positional in nature.Marland Kitchen He also continues to have chronic shortness of breath and requires oxygen 24 hours a day.  He has no other neurologic complaints. He denies any weight loss. He denies any recent fevers. He denies any chest pain. He has no nausea, vomiting, constipation, or diarrhea. He has no urinary complaints. Patient offers no further specific complaints.   REVIEW OF SYSTEMS:   Review of Systems  Constitutional: Positive for malaise/fatigue. Negative for fever and weight loss.  Respiratory: Positive for shortness of breath. Negative for cough.   Cardiovascular: Negative.  Negative for chest pain and leg swelling.  Gastrointestinal: Negative.  Negative for abdominal pain, blood in stool, melena and nausea.  Genitourinary: Negative.   Musculoskeletal: Negative.   Skin: Negative.  Negative for rash.  Neurological: Positive for weakness. Negative for tingling and speech change.  Psychiatric/Behavioral: Negative.  The patient is not  nervous/anxious.     As per HPI. Otherwise, a complete review of systems is negative.  PAST MEDICAL HISTORY: Past Medical History:  Diagnosis Date  . Amputated finger   . Anemia of chronic disease    a. plan of oncology to start Procrit - received during admission 12/2013  . Atypical chest pain    a. 12/2014 Neg CE - in setting of L pleural effusion.  . Bell's palsy   . Chronic combined systolic and diastolic CHF (congestive heart failure) ([place])    a. 11/2012 Echo: EF 60-65%, mod conc LVH, mildly dil LA/RA, mild Ao sclerosis w/o stenosis; b. 11/2014 Echo: EF 40-45%, prob mid-apicalanteroseptal, ant, apical HK, Gr2 DD, mod dil LA, mildly dil RA (technically difficult study); c. echo 11/2015: EF 60-65%, trivial AI, nl RV sys fxan, PASP 59; d. echo 10/17: EF 60-65%, no RWMA, mildly dilated LA, mildly reduced RV sys fxn, PASP 70  . CKD (chronic kidney disease), stage III    a. stage III-IV  . Diabetes mellitus without complication ([place])   . GERD (gastroesophageal reflux disease)   . History of gastroesophageal reflux (GERD)   . Hyperlipidemia   . Hypertension   . Permanent atrial fibrillation ([place])    a. Dx 12/2011, Rate-controlled, chronic Xarelto (renal dosing) - CHA2DS2VASc = 5.  . Pleural effusion, left    a. 12/2014 s/p thoracentesis - protein <3, LDH 123, no malignancy.    PAST SURGICAL HISTORY: Past Surgical History:  Procedure Laterality Date  . APPENDECTOMY    . COLONOSCOPY  09/2011  . ESOPHAGOGASTRODUODENOSCOPY    . FINGER SURGERY     right hand    FAMILY HISTORY Family History  Problem Relation Age of Onset  . Heart attack Brother   . Diabetes Mother   .  Diabetes Sister   . Hypertension Unknown        ADVANCED DIRECTIVES:    HEALTH MAINTENANCE: Social History  Substance Use Topics  . Smoking status: Former Smoker    Packs/day: 0.25    Years: 10.00    Types: Cigars    Quit date: 05/06/1966  . Smokeless tobacco: Never Used  . Alcohol use No    No Known  Allergies  Current Outpatient Prescriptions  Medication Sig Dispense Refill  . aspirin 81 MG tablet Take 81 mg by mouth daily.    . benzonatate (TESSALON) 200 MG capsule Take 1 capsule (200 mg total) by mouth 3 (three) times daily as needed for cough. 20 capsule 0  . clonazePAM (KLONOPIN) 0.5 MG tablet Take 0.5 mg by mouth 2 (two) times daily.    . cloNIDine (CATAPRES) 0.2 MG tablet Take 1 tablet (0.2 mg total) by mouth 2 (two) times daily. 180 tablet 3  . docusate sodium (COLACE) 100 MG capsule Take 100 mg by mouth daily.    . insulin NPH-regular Human (NOVOLIN 70/30) (70-30) 100 UNIT/ML injection Inject 35 Units into the skin 2 (two) times daily with a meal. 10 mL 11  . LORazepam (ATIVAN) 0.5 MG tablet Take 0.5 mg by mouth every 8 (eight) hours as needed for anxiety.    Marland Kitchen losartan (COZAAR) 50 MG tablet Take 1 tablet (50 mg total) by mouth daily. 30 tablet 2  . morphine (ROXANOL) 20 MG/ML concentrated solution Take 0.25 mLs (5 mg total) by mouth every 4 (four) hours as needed for severe pain or shortness of breath. 30 mL 0  . omeprazole (PRILOSEC) 40 MG capsule Take 1 capsule (40 mg total) by mouth daily. 90 capsule 3  . OXYGEN Inhale 2-4 L into the lungs continuous.    . pravastatin (PRAVACHOL) 40 MG tablet Take 40 mg by mouth at bedtime.     . predniSONE (DELTASONE) 10 MG tablet Take 10 mg by mouth daily with breakfast.    . sertraline (ZOLOFT) 25 MG tablet Take 50 mg by mouth daily.    Marland Kitchen torsemide (DEMADEX) 20 MG tablet Take 2 tablets (40 mg total) by mouth 2 (two) times daily as needed. (Patient taking differently: Take 20 mg by mouth daily. ) 120 tablet 3  . potassium chloride (K-DUR) 10 MEQ tablet Take 1 tablet (10 mEq total) by mouth 2 (two) times daily. 180 tablet 3   No current facility-administered medications for this visit.    Facility-Administered Medications Ordered in Other Visits  Medication Dose Route Frequency Provider Last Rate Last Dose  . epoetin alfa (EPOGEN,PROCRIT)  injection 40,000 Units  40,000 Units Subcutaneous Once Lloyd Huger, MD        OBJECTIVE: Vitals:   11/21/16 1355  BP: (!) 144/94  Pulse: 85  Resp: 20  Temp: (!) 96.6 F (35.9 C)     Body mass index is 31.4 kg/m.    ECOG FS:1 - Symptomatic but completely ambulatory  General: Well-developed, well-nourished, no acute distress. Eyes: anicteric sclera. Lungs: Clear to auscultation bilaterally. Heart: Regular rate and rhythm. No rubs, murmurs, or gallops. Abdomen: Soft, nontender, nondistended. No organomegaly noted, normoactive bowel sounds. Musculoskeletal: No edema, cyanosis, or clubbing. Neuro: Alert, answering all questions appropriately. Cranial nerves grossly intact. Skin: No rashes or petechiae noted. Psych: Normal affect.   LAB RESULTS:  Lab Results  Component Value Date   NA 142 08/21/2016   K 3.7 08/21/2016   CL 103 08/21/2016   CO2 31  08/21/2016   GLUCOSE 195 (H) 08/21/2016   BUN 25 (H) 08/21/2016   CREATININE 1.76 (H) 08/21/2016   CALCIUM 9.1 08/21/2016   PROT 7.3 12/20/2015   ALBUMIN 3.3 (L) 12/20/2015   AST 26 12/20/2015   ALT 15 (L) 12/20/2015   ALKPHOS 87 12/20/2015   BILITOT 0.9 12/20/2015   GFRNONAA 34 (L) 08/21/2016   GFRAA 39 (L) 08/21/2016    Lab Results  Component Value Date   WBC 8.9 11/21/2016   NEUTROABS 7.8 (H) 11/21/2016   HGB 11.8 (L) 11/21/2016   HCT 34.7 (L) 11/21/2016   MCV 88.6 11/21/2016   PLT 222 11/21/2016   Lab Results  Component Value Date   IRON 16 (L) 07/22/2016   TIBC 364 07/22/2016   IRONPCTSAT 4 (L) 07/22/2016    Lab Results  Component Value Date   FERRITIN 59 11/21/2016     STUDIES: No results found.  ASSESSMENT: Iron deficiency anemia, anemia secondary to chronic renal failure.  PLAN:    1. Iron deficiency anemia, anemia secondary to chronic renal failure: Patient's hemoglobin is significantly improved and is now 11.8. Iron stores are pending at time of dictation, although his ferritin is normal  at 59. He does not require Feraheme or Procrit today. He last received Feraheme on Jul 25, 2016. Patient's last injection of Procrit was on September 23, 2016. No intervention is needed at this time. Return to clinic in 4 months for repeat laboratory work and further evaluation.  2. Lingula mass/mediastinal lymphadenopathy: Despite concern for underlying malignancy, possibly lymphoma, patient previously elected not to pursue biopsy and continue simple observation. Peripheral blood flow cytometry is negative.   3. Chronic renal insufficiency: Creatinine slightly worse, treatment per nephrology. 4. CHF: Continue evaluation and treatment per cardiology. 5. Leg numbness: Appears to be positional in nature, therefore likely secondary to vascular insufficiency. Patient has been instructed that if it becomes painful or persistent that he will require evaluation by vascular surgery.  Patient expressed understanding and was in agreement with this plan. He also understands that He can call clinic at any time with any questions, concerns, or complaints.    Lloyd Huger, MD   11/22/2016 10:24 AM

## 2016-11-21 ENCOUNTER — Inpatient Hospital Stay: Payer: Medicare Other

## 2016-11-21 ENCOUNTER — Inpatient Hospital Stay: Payer: Medicare Other | Attending: Oncology | Admitting: Oncology

## 2016-11-21 VITALS — BP 144/94 | HR 85 | Temp 96.6°F | Resp 20 | Wt 200.5 lb

## 2016-11-21 DIAGNOSIS — N183 Chronic kidney disease, stage 3 unspecified: Secondary | ICD-10-CM

## 2016-11-21 DIAGNOSIS — E1122 Type 2 diabetes mellitus with diabetic chronic kidney disease: Secondary | ICD-10-CM | POA: Insufficient documentation

## 2016-11-21 DIAGNOSIS — Z7982 Long term (current) use of aspirin: Secondary | ICD-10-CM | POA: Insufficient documentation

## 2016-11-21 DIAGNOSIS — Z9981 Dependence on supplemental oxygen: Secondary | ICD-10-CM | POA: Diagnosis not present

## 2016-11-21 DIAGNOSIS — I482 Chronic atrial fibrillation: Secondary | ICD-10-CM | POA: Diagnosis not present

## 2016-11-21 DIAGNOSIS — E785 Hyperlipidemia, unspecified: Secondary | ICD-10-CM | POA: Diagnosis not present

## 2016-11-21 DIAGNOSIS — K219 Gastro-esophageal reflux disease without esophagitis: Secondary | ICD-10-CM | POA: Insufficient documentation

## 2016-11-21 DIAGNOSIS — Z87891 Personal history of nicotine dependence: Secondary | ICD-10-CM | POA: Insufficient documentation

## 2016-11-21 DIAGNOSIS — I5042 Chronic combined systolic (congestive) and diastolic (congestive) heart failure: Secondary | ICD-10-CM | POA: Insufficient documentation

## 2016-11-21 DIAGNOSIS — I700 Atherosclerosis of aorta: Secondary | ICD-10-CM | POA: Insufficient documentation

## 2016-11-21 DIAGNOSIS — D631 Anemia in chronic kidney disease: Secondary | ICD-10-CM

## 2016-11-21 DIAGNOSIS — D509 Iron deficiency anemia, unspecified: Secondary | ICD-10-CM

## 2016-11-21 DIAGNOSIS — I130 Hypertensive heart and chronic kidney disease with heart failure and stage 1 through stage 4 chronic kidney disease, or unspecified chronic kidney disease: Secondary | ICD-10-CM | POA: Insufficient documentation

## 2016-11-21 DIAGNOSIS — Z79899 Other long term (current) drug therapy: Secondary | ICD-10-CM | POA: Diagnosis not present

## 2016-11-21 DIAGNOSIS — Z794 Long term (current) use of insulin: Secondary | ICD-10-CM | POA: Insufficient documentation

## 2016-11-21 DIAGNOSIS — R590 Localized enlarged lymph nodes: Secondary | ICD-10-CM | POA: Diagnosis not present

## 2016-11-21 LAB — CBC WITH DIFFERENTIAL/PLATELET
BASOS ABS: 0.1 10*3/uL (ref 0–0.1)
Basophils Relative: 1 %
Eosinophils Absolute: 0.1 10*3/uL (ref 0–0.7)
Eosinophils Relative: 2 %
HEMATOCRIT: 34.7 % — AB (ref 40.0–52.0)
Hemoglobin: 11.8 g/dL — ABNORMAL LOW (ref 13.0–18.0)
LYMPHS PCT: 5 %
Lymphs Abs: 0.5 10*3/uL — ABNORMAL LOW (ref 1.0–3.6)
MCH: 30.1 pg (ref 26.0–34.0)
MCHC: 34 g/dL (ref 32.0–36.0)
MCV: 88.6 fL (ref 80.0–100.0)
Monocytes Absolute: 0.4 10*3/uL (ref 0.2–1.0)
Monocytes Relative: 4 %
NEUTROS ABS: 7.8 10*3/uL — AB (ref 1.4–6.5)
Neutrophils Relative %: 88 %
Platelets: 222 10*3/uL (ref 150–440)
RBC: 3.92 MIL/uL — ABNORMAL LOW (ref 4.40–5.90)
RDW: 15.2 % — ABNORMAL HIGH (ref 11.5–14.5)
WBC: 8.9 10*3/uL (ref 3.8–10.6)

## 2016-11-21 LAB — FERRITIN: Ferritin: 59 ng/mL (ref 24–336)

## 2016-11-21 NOTE — Progress Notes (Signed)
Patient here today for follow up regarding anemia. Patient reports intermittent numbness to left leg, states his leg "feels like a weight". Patient reports numbness is worse after periods of sitting.

## 2016-12-03 DIAGNOSIS — I5042 Chronic combined systolic (congestive) and diastolic (congestive) heart failure: Secondary | ICD-10-CM | POA: Diagnosis not present

## 2016-12-03 DIAGNOSIS — J449 Chronic obstructive pulmonary disease, unspecified: Secondary | ICD-10-CM | POA: Diagnosis not present

## 2016-12-03 DIAGNOSIS — Z515 Encounter for palliative care: Secondary | ICD-10-CM | POA: Diagnosis not present

## 2016-12-03 DIAGNOSIS — J9601 Acute respiratory failure with hypoxia: Secondary | ICD-10-CM | POA: Diagnosis not present

## 2016-12-03 DIAGNOSIS — I481 Persistent atrial fibrillation: Secondary | ICD-10-CM | POA: Diagnosis not present

## 2016-12-03 DIAGNOSIS — N183 Chronic kidney disease, stage 3 (moderate): Secondary | ICD-10-CM | POA: Diagnosis not present

## 2016-12-06 DIAGNOSIS — J449 Chronic obstructive pulmonary disease, unspecified: Secondary | ICD-10-CM | POA: Diagnosis not present

## 2016-12-06 DIAGNOSIS — R262 Difficulty in walking, not elsewhere classified: Secondary | ICD-10-CM | POA: Diagnosis not present

## 2016-12-06 DIAGNOSIS — E1122 Type 2 diabetes mellitus with diabetic chronic kidney disease: Secondary | ICD-10-CM | POA: Diagnosis not present

## 2016-12-09 ENCOUNTER — Ambulatory Visit: Payer: Medicare HMO | Admitting: Family

## 2016-12-09 DIAGNOSIS — E669 Obesity, unspecified: Secondary | ICD-10-CM | POA: Diagnosis not present

## 2016-12-09 DIAGNOSIS — I129 Hypertensive chronic kidney disease with stage 1 through stage 4 chronic kidney disease, or unspecified chronic kidney disease: Secondary | ICD-10-CM | POA: Diagnosis not present

## 2016-12-09 DIAGNOSIS — Z9981 Dependence on supplemental oxygen: Secondary | ICD-10-CM | POA: Diagnosis not present

## 2016-12-09 DIAGNOSIS — M1990 Unspecified osteoarthritis, unspecified site: Secondary | ICD-10-CM | POA: Diagnosis not present

## 2016-12-09 DIAGNOSIS — N183 Chronic kidney disease, stage 3 (moderate): Secondary | ICD-10-CM | POA: Diagnosis not present

## 2016-12-09 DIAGNOSIS — E1122 Type 2 diabetes mellitus with diabetic chronic kidney disease: Secondary | ICD-10-CM | POA: Diagnosis not present

## 2016-12-09 DIAGNOSIS — R262 Difficulty in walking, not elsewhere classified: Secondary | ICD-10-CM | POA: Diagnosis not present

## 2016-12-09 DIAGNOSIS — J449 Chronic obstructive pulmonary disease, unspecified: Secondary | ICD-10-CM | POA: Diagnosis not present

## 2016-12-09 DIAGNOSIS — E785 Hyperlipidemia, unspecified: Secondary | ICD-10-CM | POA: Diagnosis not present

## 2016-12-11 DIAGNOSIS — M1990 Unspecified osteoarthritis, unspecified site: Secondary | ICD-10-CM | POA: Diagnosis not present

## 2016-12-11 DIAGNOSIS — R262 Difficulty in walking, not elsewhere classified: Secondary | ICD-10-CM | POA: Diagnosis not present

## 2016-12-11 DIAGNOSIS — E785 Hyperlipidemia, unspecified: Secondary | ICD-10-CM | POA: Diagnosis not present

## 2016-12-11 DIAGNOSIS — Z9981 Dependence on supplemental oxygen: Secondary | ICD-10-CM | POA: Diagnosis not present

## 2016-12-11 DIAGNOSIS — E1122 Type 2 diabetes mellitus with diabetic chronic kidney disease: Secondary | ICD-10-CM | POA: Diagnosis not present

## 2016-12-11 DIAGNOSIS — N183 Chronic kidney disease, stage 3 (moderate): Secondary | ICD-10-CM | POA: Diagnosis not present

## 2016-12-11 DIAGNOSIS — I129 Hypertensive chronic kidney disease with stage 1 through stage 4 chronic kidney disease, or unspecified chronic kidney disease: Secondary | ICD-10-CM | POA: Diagnosis not present

## 2016-12-11 DIAGNOSIS — J449 Chronic obstructive pulmonary disease, unspecified: Secondary | ICD-10-CM | POA: Diagnosis not present

## 2016-12-11 DIAGNOSIS — E669 Obesity, unspecified: Secondary | ICD-10-CM | POA: Diagnosis not present

## 2016-12-13 DIAGNOSIS — I129 Hypertensive chronic kidney disease with stage 1 through stage 4 chronic kidney disease, or unspecified chronic kidney disease: Secondary | ICD-10-CM | POA: Diagnosis not present

## 2016-12-13 DIAGNOSIS — J449 Chronic obstructive pulmonary disease, unspecified: Secondary | ICD-10-CM | POA: Diagnosis not present

## 2016-12-13 DIAGNOSIS — Z9981 Dependence on supplemental oxygen: Secondary | ICD-10-CM | POA: Diagnosis not present

## 2016-12-13 DIAGNOSIS — E1122 Type 2 diabetes mellitus with diabetic chronic kidney disease: Secondary | ICD-10-CM | POA: Diagnosis not present

## 2016-12-13 DIAGNOSIS — E785 Hyperlipidemia, unspecified: Secondary | ICD-10-CM | POA: Diagnosis not present

## 2016-12-13 DIAGNOSIS — N183 Chronic kidney disease, stage 3 (moderate): Secondary | ICD-10-CM | POA: Diagnosis not present

## 2016-12-13 DIAGNOSIS — E669 Obesity, unspecified: Secondary | ICD-10-CM | POA: Diagnosis not present

## 2016-12-13 DIAGNOSIS — R262 Difficulty in walking, not elsewhere classified: Secondary | ICD-10-CM | POA: Diagnosis not present

## 2016-12-13 DIAGNOSIS — M1990 Unspecified osteoarthritis, unspecified site: Secondary | ICD-10-CM | POA: Diagnosis not present

## 2016-12-17 ENCOUNTER — Ambulatory Visit: Payer: Medicare HMO | Admitting: Family

## 2016-12-17 DIAGNOSIS — I481 Persistent atrial fibrillation: Secondary | ICD-10-CM | POA: Diagnosis not present

## 2016-12-17 DIAGNOSIS — I5042 Chronic combined systolic (congestive) and diastolic (congestive) heart failure: Secondary | ICD-10-CM | POA: Diagnosis not present

## 2016-12-17 DIAGNOSIS — J449 Chronic obstructive pulmonary disease, unspecified: Secondary | ICD-10-CM | POA: Diagnosis not present

## 2016-12-17 DIAGNOSIS — I129 Hypertensive chronic kidney disease with stage 1 through stage 4 chronic kidney disease, or unspecified chronic kidney disease: Secondary | ICD-10-CM | POA: Diagnosis not present

## 2016-12-17 DIAGNOSIS — M1990 Unspecified osteoarthritis, unspecified site: Secondary | ICD-10-CM | POA: Diagnosis not present

## 2016-12-17 DIAGNOSIS — E1122 Type 2 diabetes mellitus with diabetic chronic kidney disease: Secondary | ICD-10-CM | POA: Diagnosis not present

## 2016-12-17 DIAGNOSIS — E669 Obesity, unspecified: Secondary | ICD-10-CM | POA: Diagnosis not present

## 2016-12-17 DIAGNOSIS — R262 Difficulty in walking, not elsewhere classified: Secondary | ICD-10-CM | POA: Diagnosis not present

## 2016-12-17 DIAGNOSIS — J9601 Acute respiratory failure with hypoxia: Secondary | ICD-10-CM | POA: Diagnosis not present

## 2016-12-17 DIAGNOSIS — E785 Hyperlipidemia, unspecified: Secondary | ICD-10-CM | POA: Diagnosis not present

## 2016-12-17 DIAGNOSIS — N183 Chronic kidney disease, stage 3 (moderate): Secondary | ICD-10-CM | POA: Diagnosis not present

## 2016-12-17 DIAGNOSIS — Z9981 Dependence on supplemental oxygen: Secondary | ICD-10-CM | POA: Diagnosis not present

## 2016-12-19 DIAGNOSIS — J441 Chronic obstructive pulmonary disease with (acute) exacerbation: Secondary | ICD-10-CM | POA: Diagnosis not present

## 2016-12-20 DIAGNOSIS — M1990 Unspecified osteoarthritis, unspecified site: Secondary | ICD-10-CM | POA: Diagnosis not present

## 2016-12-20 DIAGNOSIS — Z9981 Dependence on supplemental oxygen: Secondary | ICD-10-CM | POA: Diagnosis not present

## 2016-12-20 DIAGNOSIS — E785 Hyperlipidemia, unspecified: Secondary | ICD-10-CM | POA: Diagnosis not present

## 2016-12-20 DIAGNOSIS — E1122 Type 2 diabetes mellitus with diabetic chronic kidney disease: Secondary | ICD-10-CM | POA: Diagnosis not present

## 2016-12-20 DIAGNOSIS — J449 Chronic obstructive pulmonary disease, unspecified: Secondary | ICD-10-CM | POA: Diagnosis not present

## 2016-12-20 DIAGNOSIS — I129 Hypertensive chronic kidney disease with stage 1 through stage 4 chronic kidney disease, or unspecified chronic kidney disease: Secondary | ICD-10-CM | POA: Diagnosis not present

## 2016-12-20 DIAGNOSIS — N183 Chronic kidney disease, stage 3 (moderate): Secondary | ICD-10-CM | POA: Diagnosis not present

## 2016-12-20 DIAGNOSIS — R262 Difficulty in walking, not elsewhere classified: Secondary | ICD-10-CM | POA: Diagnosis not present

## 2016-12-20 DIAGNOSIS — E669 Obesity, unspecified: Secondary | ICD-10-CM | POA: Diagnosis not present

## 2016-12-21 DIAGNOSIS — Z9981 Dependence on supplemental oxygen: Secondary | ICD-10-CM | POA: Diagnosis not present

## 2016-12-21 DIAGNOSIS — I129 Hypertensive chronic kidney disease with stage 1 through stage 4 chronic kidney disease, or unspecified chronic kidney disease: Secondary | ICD-10-CM | POA: Diagnosis not present

## 2016-12-21 DIAGNOSIS — E785 Hyperlipidemia, unspecified: Secondary | ICD-10-CM | POA: Diagnosis not present

## 2016-12-21 DIAGNOSIS — E1122 Type 2 diabetes mellitus with diabetic chronic kidney disease: Secondary | ICD-10-CM | POA: Diagnosis not present

## 2016-12-21 DIAGNOSIS — E669 Obesity, unspecified: Secondary | ICD-10-CM | POA: Diagnosis not present

## 2016-12-21 DIAGNOSIS — N183 Chronic kidney disease, stage 3 (moderate): Secondary | ICD-10-CM | POA: Diagnosis not present

## 2016-12-21 DIAGNOSIS — M1990 Unspecified osteoarthritis, unspecified site: Secondary | ICD-10-CM | POA: Diagnosis not present

## 2016-12-21 DIAGNOSIS — J449 Chronic obstructive pulmonary disease, unspecified: Secondary | ICD-10-CM | POA: Diagnosis not present

## 2016-12-21 DIAGNOSIS — R262 Difficulty in walking, not elsewhere classified: Secondary | ICD-10-CM | POA: Diagnosis not present

## 2016-12-23 ENCOUNTER — Encounter: Payer: Self-pay | Admitting: Family

## 2016-12-23 ENCOUNTER — Ambulatory Visit: Payer: Medicare HMO | Attending: Family | Admitting: Family

## 2016-12-23 VITALS — BP 124/62 | HR 101 | Resp 18 | Ht 67.0 in | Wt 203.1 lb

## 2016-12-23 DIAGNOSIS — E785 Hyperlipidemia, unspecified: Secondary | ICD-10-CM | POA: Diagnosis not present

## 2016-12-23 DIAGNOSIS — I482 Chronic atrial fibrillation: Secondary | ICD-10-CM | POA: Diagnosis not present

## 2016-12-23 DIAGNOSIS — Z794 Long term (current) use of insulin: Secondary | ICD-10-CM | POA: Diagnosis not present

## 2016-12-23 DIAGNOSIS — N183 Chronic kidney disease, stage 3 unspecified: Secondary | ICD-10-CM

## 2016-12-23 DIAGNOSIS — I130 Hypertensive heart and chronic kidney disease with heart failure and stage 1 through stage 4 chronic kidney disease, or unspecified chronic kidney disease: Secondary | ICD-10-CM | POA: Insufficient documentation

## 2016-12-23 DIAGNOSIS — Z833 Family history of diabetes mellitus: Secondary | ICD-10-CM | POA: Insufficient documentation

## 2016-12-23 DIAGNOSIS — Z8249 Family history of ischemic heart disease and other diseases of the circulatory system: Secondary | ICD-10-CM | POA: Insufficient documentation

## 2016-12-23 DIAGNOSIS — Z7982 Long term (current) use of aspirin: Secondary | ICD-10-CM | POA: Insufficient documentation

## 2016-12-23 DIAGNOSIS — I10 Essential (primary) hypertension: Secondary | ICD-10-CM

## 2016-12-23 DIAGNOSIS — R000 Tachycardia, unspecified: Secondary | ICD-10-CM | POA: Diagnosis not present

## 2016-12-23 DIAGNOSIS — Z87891 Personal history of nicotine dependence: Secondary | ICD-10-CM | POA: Diagnosis not present

## 2016-12-23 DIAGNOSIS — E1122 Type 2 diabetes mellitus with diabetic chronic kidney disease: Secondary | ICD-10-CM | POA: Insufficient documentation

## 2016-12-23 DIAGNOSIS — I5032 Chronic diastolic (congestive) heart failure: Secondary | ICD-10-CM

## 2016-12-23 DIAGNOSIS — Z79899 Other long term (current) drug therapy: Secondary | ICD-10-CM | POA: Insufficient documentation

## 2016-12-23 DIAGNOSIS — K219 Gastro-esophageal reflux disease without esophagitis: Secondary | ICD-10-CM | POA: Diagnosis not present

## 2016-12-23 NOTE — Progress Notes (Signed)
Patient ID: Patrick [name], male    DOB: October 17, 1931, 83 y.o.   MRN: [id number]  HPI  Patrick [name] is a 83 y/o male with a history of chronic kidney disease (stage III-IV), diabetes on insulin, HTN, persistent atrial fibrillation, hyperlipidemia, anemia, remote tobacco use and chronic diastolic heart failure.  Last echo done 01/22/16 which showed an EF of 60-65%without any aortic stenosis and trivial tricuspid regurgitation. Elevated PA pressure of 70 mm Hg. This is an improvement from his previous echocardiogram which showed an EF of 40-45%.  ED visit on 08/21/16 for chest pain and was imaged. Admitted 06/30/16 due to pneumonia and HF exacerbation. Initially required bipap and then transitioned back to nasal canula oxygen at 3L. Needed 1 unit of PRBC's due to anemia. Discharged home with hospice services after 2 days. Admitted on 04/02/16 with acute bronchitis. Treated with IV steroids, antibiotics and nebulizers. Discharged home with prednisone. Previous admission was on 01/22/16 for acute on chronic heart failure. Patient was diuresed and cardiology consult obtained. Discharged in 2 days. Also admitted on 12/20/15 with exacerbation of heart failure. Was IV diuresed, was seen by cardiology and discharged home. Torsemide ordered for every other day.   Returns today for a follow-up visit with a chief complaint of moderate shortness of breath upon minimal exertion. He describes this as chronic in nature having been present for several years with varying levels of severity. He has associated fatigue, edema and easy bruising. Denies any chest pain, palpitations or dizziness.  Taking torsemide 20mg  daily and can also take an additional 20mg  in the PM if needed.   Past Medical History:  Diagnosis Date  . Amputated finger   . Anemia of chronic disease    a. plan of oncology to start Procrit - received during admission 12/2013  . Atypical chest pain    a. 12/2014 Neg CE - in setting of L pleural  effusion.  . Bell's palsy   . Chronic combined systolic and diastolic CHF (congestive heart failure) ([place])    a. 11/2012 Echo: EF 60-65%, mod conc LVH, mildly dil LA/RA, mild Ao sclerosis w/o stenosis; b. 11/2014 Echo: EF 40-45%, prob mid-apicalanteroseptal, ant, apical HK, Gr2 DD, mod dil LA, mildly dil RA (technically difficult study); c. echo 11/2015: EF 60-65%, trivial AI, nl RV sys fxan, PASP 59; d. echo 10/17: EF 60-65%, no RWMA, mildly dilated LA, mildly reduced RV sys fxn, PASP 70  . CKD (chronic kidney disease), stage III (HCC)    a. stage III-IV  . Diabetes mellitus without complication ([place])   . GERD (gastroesophageal reflux disease)   . History of gastroesophageal reflux (GERD)   . Hyperlipidemia   . Hypertension   . Permanent atrial fibrillation ([place])    a. Dx 12/2011, Rate-controlled, chronic Xarelto (renal dosing) - CHA2DS2VASc = 5.  . Pleural effusion, left    a. 12/2014 s/p thoracentesis - protein <3, LDH 123, no malignancy.   Past Surgical History:  Procedure Laterality Date  . APPENDECTOMY    . COLONOSCOPY  09/2011  . ESOPHAGOGASTRODUODENOSCOPY    . FINGER SURGERY     right hand   Family History  Problem Relation Age of Onset  . Heart attack Brother   . Diabetes Mother   . Diabetes Sister   . Hypertension Unknown    Social History  Substance Use Topics  . Smoking status: Former Smoker    Packs/day: 0.25    Years: 10.00    Types: Cigars    Quit  date: 05/06/1966  . Smokeless tobacco: Never Used  . Alcohol use No   No Known Allergies Prior to Admission medications   Medication Sig Start Date End Date Taking? Authorizing Provider  aspirin 81 MG tablet Take 81 mg by mouth daily.   Yes [provider]  benzonatate (TESSALON) 200 MG capsule Take 1 capsule (200 mg total) by mouth 3 (three) times daily as needed for cough. 07/02/16  Yes Gladstone Lighter, MD  clonazePAM (KLONOPIN) 0.5 MG tablet Take 0.5 mg by mouth 2 (two) times daily.   Yes [provider]  cloNIDine (CATAPRES) 0.2 MG tablet Take 1 tablet (0.2 mg total) by mouth 2 (two) times daily. 08/13/16  Yes Minna Merritts, MD  docusate sodium (COLACE) 100 MG capsule Take 100 mg by mouth daily.   Yes [provider]  doxycycline (VIBRAMYCIN) 100 MG capsule Take 100 mg by mouth 2 (two) times daily.   Yes [provider]  insulin NPH-regular Human (NOVOLIN 70/30) (70-30) 100 UNIT/ML injection Inject 35 Units into the skin 2 (two) times daily with a meal. 07/02/16  Yes Gladstone Lighter, MD  LORazepam (ATIVAN) 0.5 MG tablet Take 0.5 mg by mouth every 8 (eight) hours as needed for anxiety.   Yes [provider]  losartan (COZAAR) 50 MG tablet Take 1 tablet (50 mg total) by mouth daily. 07/02/16  Yes Gladstone Lighter, MD  morphine (ROXANOL) 20 MG/ML concentrated solution Take 0.25 mLs (5 mg total) by mouth every 4 (four) hours as needed for severe pain or shortness of breath. 07/02/16  Yes Gladstone Lighter, MD  omeprazole (PRILOSEC) 40 MG capsule Take 1 capsule (40 mg total) by mouth daily. 04/15/16  Yes Memphis Decoteau A, FNP  OXYGEN Inhale 2-4 L into the lungs continuous.   Yes [provider]  potassium chloride (K-DUR) 10 MEQ tablet Take 1 tablet (10 mEq total) by mouth 2 (two) times daily. 07/18/16 12/23/16 Yes Gollan, Kathlene November, MD  pravastatin (PRAVACHOL) 40 MG tablet Take 40 mg by mouth at bedtime.    Yes [provider]  predniSONE (DELTASONE) 10 MG tablet Take 10 mg by mouth daily with breakfast.   Yes [provider]  sertraline (ZOLOFT) 25 MG tablet Take 50 mg by mouth daily.   Yes [provider]  torsemide (DEMADEX) 20 MG tablet Take 2 tablets (40 mg total) by mouth 2 (two) times daily as needed. Patient taking differently: Take 20 mg by mouth daily.  07/18/16  Yes Minna Merritts, MD    Review of Systems  Constitutional: Positive for fatigue. Negative for appetite change.  HENT: Negative for congestion,  postnasal drip and sore throat.   Eyes: Negative.   Respiratory: Positive for shortness of breath. Negative for chest tightness.   Cardiovascular: Positive for leg swelling. Negative for chest pain and palpitations.  Gastrointestinal: Negative for abdominal distention and abdominal pain.  Endocrine: Negative.   Genitourinary: Negative.   Musculoskeletal: Negative for back pain and neck pain.  Skin: Negative.   Allergic/Immunologic: Negative.   Neurological: Negative for dizziness and light-headedness.  Hematological: Negative for adenopathy. Bruises/bleeds easily.  Psychiatric/Behavioral: Negative for dysphoric mood and sleep disturbance. The patient is not nervous/anxious.    Vitals:   12/23/16 1522  BP: 124/62  Pulse: (!) 101  Resp: 18  SpO2: 97%  Weight: 203 lb 2 oz (92.1 kg)  Height: 5\' 7"  (1.702 m)   Wt Readings from Last 3 Encounters:  12/23/16 203 lb 2 oz (92.1 kg)  11/21/16 200 lb 8 oz (90.9 kg)  09/05/16 196 lb (88.9 kg)   Lab Results  Component Value Date   CREATININE 1.76 (H) 08/21/2016   CREATININE 1.51 (H) 08/13/2016   CREATININE 2.07 (H) 07/02/2016    Physical Exam  Constitutional: He is oriented to person, place, and time. He appears well-developed and well-nourished.  HENT:  Head: Normocephalic and atraumatic.  Neck: Normal range of motion. Neck supple. No JVD present.  Cardiovascular: Regular rhythm.  Tachycardia present.   Pulmonary/Chest: Effort normal. He has no wheezes. He has no rales.  Abdominal: Soft. He exhibits no distension. There is no tenderness.  Musculoskeletal: He exhibits edema (1+ pitting edema in bilateral lower legs). He exhibits no tenderness.  Neurological: He is alert and oriented to person, place, and time.  Skin: Skin is warm and dry.  Psychiatric: He has a normal mood and affect. His behavior is normal. Thought content normal.  Nursing note and vitals reviewed.  Assessment & Plan:  1: Chronic heart failure with preserved  ejection fraction-  - NYHA class III - mildly fluid overloaded today with continued edema in lower legs.  - Continue daily weights and call for an overnight weight gain of >2 pounds or weekly weight gain of >5 pounds. Weight up 7 pounds since last visit.  - elevate legs when sitting at home as much as possible - confirmed with wife that he is taking 40mg  torsemide BID for wt> 198 pounds. Wt >193 pounds, he takes 40mg  torsemide AM and weight <193 pounds, take 20mg  torsemide AM; taking extra potassium as well.  - not adding salt to his food - last saw cardiologist [name]) on 08/13/16 - has home health nurse through [facility] coming out now - saw pulmonologist Alva Garnet) 05/17/16 - tachycardic today but this morning at home, his wife says his heart rate was 59  2: HTN- - Blood pressure looks good - saw PCP Kary Kos) 11/06/16  3: CKD, stage III-IV- -Follows closely with nephrology  -Remains uninterested in dialysis - saw nephrologist June 2018  4: Diabetes- - blood sugar at home was 207 this morning - checking blood sugar twice a day  - using insulin about twice a week when sugar is over 150   Reviewed medication list with wife.   Return in 6 months or sooner for any questions/problems before then.

## 2016-12-23 NOTE — Patient Instructions (Signed)
Continue weighing daily and call for an overnight weight gain of > 2 pounds or a weekly weight gain of >5 pounds. 

## 2016-12-24 DIAGNOSIS — Z9981 Dependence on supplemental oxygen: Secondary | ICD-10-CM | POA: Diagnosis not present

## 2016-12-24 DIAGNOSIS — M1990 Unspecified osteoarthritis, unspecified site: Secondary | ICD-10-CM | POA: Diagnosis not present

## 2016-12-24 DIAGNOSIS — E785 Hyperlipidemia, unspecified: Secondary | ICD-10-CM | POA: Diagnosis not present

## 2016-12-24 DIAGNOSIS — I129 Hypertensive chronic kidney disease with stage 1 through stage 4 chronic kidney disease, or unspecified chronic kidney disease: Secondary | ICD-10-CM | POA: Diagnosis not present

## 2016-12-24 DIAGNOSIS — E669 Obesity, unspecified: Secondary | ICD-10-CM | POA: Diagnosis not present

## 2016-12-24 DIAGNOSIS — J449 Chronic obstructive pulmonary disease, unspecified: Secondary | ICD-10-CM | POA: Diagnosis not present

## 2016-12-24 DIAGNOSIS — R262 Difficulty in walking, not elsewhere classified: Secondary | ICD-10-CM | POA: Diagnosis not present

## 2016-12-24 DIAGNOSIS — E1122 Type 2 diabetes mellitus with diabetic chronic kidney disease: Secondary | ICD-10-CM | POA: Diagnosis not present

## 2016-12-24 DIAGNOSIS — N183 Chronic kidney disease, stage 3 (moderate): Secondary | ICD-10-CM | POA: Diagnosis not present

## 2016-12-25 DIAGNOSIS — R262 Difficulty in walking, not elsewhere classified: Secondary | ICD-10-CM | POA: Diagnosis not present

## 2016-12-25 DIAGNOSIS — N183 Chronic kidney disease, stage 3 (moderate): Secondary | ICD-10-CM | POA: Diagnosis not present

## 2016-12-25 DIAGNOSIS — J449 Chronic obstructive pulmonary disease, unspecified: Secondary | ICD-10-CM | POA: Diagnosis not present

## 2016-12-25 DIAGNOSIS — M1990 Unspecified osteoarthritis, unspecified site: Secondary | ICD-10-CM | POA: Diagnosis not present

## 2016-12-25 DIAGNOSIS — I129 Hypertensive chronic kidney disease with stage 1 through stage 4 chronic kidney disease, or unspecified chronic kidney disease: Secondary | ICD-10-CM | POA: Diagnosis not present

## 2016-12-25 DIAGNOSIS — Z9981 Dependence on supplemental oxygen: Secondary | ICD-10-CM | POA: Diagnosis not present

## 2016-12-25 DIAGNOSIS — E1122 Type 2 diabetes mellitus with diabetic chronic kidney disease: Secondary | ICD-10-CM | POA: Diagnosis not present

## 2016-12-25 DIAGNOSIS — E785 Hyperlipidemia, unspecified: Secondary | ICD-10-CM | POA: Diagnosis not present

## 2016-12-25 DIAGNOSIS — E669 Obesity, unspecified: Secondary | ICD-10-CM | POA: Diagnosis not present

## 2016-12-27 DIAGNOSIS — R262 Difficulty in walking, not elsewhere classified: Secondary | ICD-10-CM | POA: Diagnosis not present

## 2016-12-27 DIAGNOSIS — E669 Obesity, unspecified: Secondary | ICD-10-CM | POA: Diagnosis not present

## 2016-12-27 DIAGNOSIS — N183 Chronic kidney disease, stage 3 (moderate): Secondary | ICD-10-CM | POA: Diagnosis not present

## 2016-12-27 DIAGNOSIS — Z9981 Dependence on supplemental oxygen: Secondary | ICD-10-CM | POA: Diagnosis not present

## 2016-12-27 DIAGNOSIS — E785 Hyperlipidemia, unspecified: Secondary | ICD-10-CM | POA: Diagnosis not present

## 2016-12-27 DIAGNOSIS — I129 Hypertensive chronic kidney disease with stage 1 through stage 4 chronic kidney disease, or unspecified chronic kidney disease: Secondary | ICD-10-CM | POA: Diagnosis not present

## 2016-12-27 DIAGNOSIS — E1122 Type 2 diabetes mellitus with diabetic chronic kidney disease: Secondary | ICD-10-CM | POA: Diagnosis not present

## 2016-12-27 DIAGNOSIS — J449 Chronic obstructive pulmonary disease, unspecified: Secondary | ICD-10-CM | POA: Diagnosis not present

## 2016-12-27 DIAGNOSIS — M1990 Unspecified osteoarthritis, unspecified site: Secondary | ICD-10-CM | POA: Diagnosis not present

## 2016-12-31 DIAGNOSIS — R262 Difficulty in walking, not elsewhere classified: Secondary | ICD-10-CM | POA: Diagnosis not present

## 2016-12-31 DIAGNOSIS — E785 Hyperlipidemia, unspecified: Secondary | ICD-10-CM | POA: Diagnosis not present

## 2016-12-31 DIAGNOSIS — N183 Chronic kidney disease, stage 3 (moderate): Secondary | ICD-10-CM | POA: Diagnosis not present

## 2016-12-31 DIAGNOSIS — E669 Obesity, unspecified: Secondary | ICD-10-CM | POA: Diagnosis not present

## 2016-12-31 DIAGNOSIS — Z9981 Dependence on supplemental oxygen: Secondary | ICD-10-CM | POA: Diagnosis not present

## 2016-12-31 DIAGNOSIS — E1122 Type 2 diabetes mellitus with diabetic chronic kidney disease: Secondary | ICD-10-CM | POA: Diagnosis not present

## 2016-12-31 DIAGNOSIS — I129 Hypertensive chronic kidney disease with stage 1 through stage 4 chronic kidney disease, or unspecified chronic kidney disease: Secondary | ICD-10-CM | POA: Diagnosis not present

## 2016-12-31 DIAGNOSIS — J449 Chronic obstructive pulmonary disease, unspecified: Secondary | ICD-10-CM | POA: Diagnosis not present

## 2016-12-31 DIAGNOSIS — M1990 Unspecified osteoarthritis, unspecified site: Secondary | ICD-10-CM | POA: Diagnosis not present

## 2017-01-01 DIAGNOSIS — E1122 Type 2 diabetes mellitus with diabetic chronic kidney disease: Secondary | ICD-10-CM | POA: Diagnosis not present

## 2017-01-01 DIAGNOSIS — M1990 Unspecified osteoarthritis, unspecified site: Secondary | ICD-10-CM | POA: Diagnosis not present

## 2017-01-01 DIAGNOSIS — R262 Difficulty in walking, not elsewhere classified: Secondary | ICD-10-CM | POA: Diagnosis not present

## 2017-01-01 DIAGNOSIS — N183 Chronic kidney disease, stage 3 (moderate): Secondary | ICD-10-CM | POA: Diagnosis not present

## 2017-01-01 DIAGNOSIS — E785 Hyperlipidemia, unspecified: Secondary | ICD-10-CM | POA: Diagnosis not present

## 2017-01-01 DIAGNOSIS — Z9981 Dependence on supplemental oxygen: Secondary | ICD-10-CM | POA: Diagnosis not present

## 2017-01-01 DIAGNOSIS — I129 Hypertensive chronic kidney disease with stage 1 through stage 4 chronic kidney disease, or unspecified chronic kidney disease: Secondary | ICD-10-CM | POA: Diagnosis not present

## 2017-01-01 DIAGNOSIS — E669 Obesity, unspecified: Secondary | ICD-10-CM | POA: Diagnosis not present

## 2017-01-01 DIAGNOSIS — J449 Chronic obstructive pulmonary disease, unspecified: Secondary | ICD-10-CM | POA: Diagnosis not present

## 2017-01-02 DIAGNOSIS — J449 Chronic obstructive pulmonary disease, unspecified: Secondary | ICD-10-CM | POA: Diagnosis not present

## 2017-01-02 DIAGNOSIS — R262 Difficulty in walking, not elsewhere classified: Secondary | ICD-10-CM | POA: Diagnosis not present

## 2017-01-02 DIAGNOSIS — J9601 Acute respiratory failure with hypoxia: Secondary | ICD-10-CM | POA: Diagnosis not present

## 2017-01-02 DIAGNOSIS — Z9981 Dependence on supplemental oxygen: Secondary | ICD-10-CM | POA: Diagnosis not present

## 2017-01-02 DIAGNOSIS — I5042 Chronic combined systolic (congestive) and diastolic (congestive) heart failure: Secondary | ICD-10-CM | POA: Diagnosis not present

## 2017-01-02 DIAGNOSIS — I129 Hypertensive chronic kidney disease with stage 1 through stage 4 chronic kidney disease, or unspecified chronic kidney disease: Secondary | ICD-10-CM | POA: Diagnosis not present

## 2017-01-02 DIAGNOSIS — I481 Persistent atrial fibrillation: Secondary | ICD-10-CM | POA: Diagnosis not present

## 2017-01-02 DIAGNOSIS — E785 Hyperlipidemia, unspecified: Secondary | ICD-10-CM | POA: Diagnosis not present

## 2017-01-02 DIAGNOSIS — E669 Obesity, unspecified: Secondary | ICD-10-CM | POA: Diagnosis not present

## 2017-01-02 DIAGNOSIS — N183 Chronic kidney disease, stage 3 (moderate): Secondary | ICD-10-CM | POA: Diagnosis not present

## 2017-01-02 DIAGNOSIS — E1122 Type 2 diabetes mellitus with diabetic chronic kidney disease: Secondary | ICD-10-CM | POA: Diagnosis not present

## 2017-01-02 DIAGNOSIS — M1990 Unspecified osteoarthritis, unspecified site: Secondary | ICD-10-CM | POA: Diagnosis not present

## 2017-01-08 DIAGNOSIS — E785 Hyperlipidemia, unspecified: Secondary | ICD-10-CM | POA: Diagnosis not present

## 2017-01-08 DIAGNOSIS — J449 Chronic obstructive pulmonary disease, unspecified: Secondary | ICD-10-CM | POA: Diagnosis not present

## 2017-01-08 DIAGNOSIS — E1122 Type 2 diabetes mellitus with diabetic chronic kidney disease: Secondary | ICD-10-CM | POA: Diagnosis not present

## 2017-01-08 DIAGNOSIS — E669 Obesity, unspecified: Secondary | ICD-10-CM | POA: Diagnosis not present

## 2017-01-08 DIAGNOSIS — R262 Difficulty in walking, not elsewhere classified: Secondary | ICD-10-CM | POA: Diagnosis not present

## 2017-01-08 DIAGNOSIS — I129 Hypertensive chronic kidney disease with stage 1 through stage 4 chronic kidney disease, or unspecified chronic kidney disease: Secondary | ICD-10-CM | POA: Diagnosis not present

## 2017-01-08 DIAGNOSIS — Z9981 Dependence on supplemental oxygen: Secondary | ICD-10-CM | POA: Diagnosis not present

## 2017-01-08 DIAGNOSIS — M1990 Unspecified osteoarthritis, unspecified site: Secondary | ICD-10-CM | POA: Diagnosis not present

## 2017-01-08 DIAGNOSIS — N183 Chronic kidney disease, stage 3 (moderate): Secondary | ICD-10-CM | POA: Diagnosis not present

## 2017-01-09 DIAGNOSIS — E1122 Type 2 diabetes mellitus with diabetic chronic kidney disease: Secondary | ICD-10-CM | POA: Diagnosis not present

## 2017-01-09 DIAGNOSIS — M1990 Unspecified osteoarthritis, unspecified site: Secondary | ICD-10-CM | POA: Diagnosis not present

## 2017-01-09 DIAGNOSIS — J449 Chronic obstructive pulmonary disease, unspecified: Secondary | ICD-10-CM | POA: Diagnosis not present

## 2017-01-09 DIAGNOSIS — R262 Difficulty in walking, not elsewhere classified: Secondary | ICD-10-CM | POA: Diagnosis not present

## 2017-01-09 DIAGNOSIS — N183 Chronic kidney disease, stage 3 (moderate): Secondary | ICD-10-CM | POA: Diagnosis not present

## 2017-01-09 DIAGNOSIS — Z9981 Dependence on supplemental oxygen: Secondary | ICD-10-CM | POA: Diagnosis not present

## 2017-01-09 DIAGNOSIS — E785 Hyperlipidemia, unspecified: Secondary | ICD-10-CM | POA: Diagnosis not present

## 2017-01-09 DIAGNOSIS — I129 Hypertensive chronic kidney disease with stage 1 through stage 4 chronic kidney disease, or unspecified chronic kidney disease: Secondary | ICD-10-CM | POA: Diagnosis not present

## 2017-01-09 DIAGNOSIS — E669 Obesity, unspecified: Secondary | ICD-10-CM | POA: Diagnosis not present

## 2017-01-14 DIAGNOSIS — Z9981 Dependence on supplemental oxygen: Secondary | ICD-10-CM | POA: Diagnosis not present

## 2017-01-14 DIAGNOSIS — M1990 Unspecified osteoarthritis, unspecified site: Secondary | ICD-10-CM | POA: Diagnosis not present

## 2017-01-14 DIAGNOSIS — N183 Chronic kidney disease, stage 3 (moderate): Secondary | ICD-10-CM | POA: Diagnosis not present

## 2017-01-14 DIAGNOSIS — E785 Hyperlipidemia, unspecified: Secondary | ICD-10-CM | POA: Diagnosis not present

## 2017-01-14 DIAGNOSIS — R262 Difficulty in walking, not elsewhere classified: Secondary | ICD-10-CM | POA: Diagnosis not present

## 2017-01-14 DIAGNOSIS — E669 Obesity, unspecified: Secondary | ICD-10-CM | POA: Diagnosis not present

## 2017-01-14 DIAGNOSIS — E1122 Type 2 diabetes mellitus with diabetic chronic kidney disease: Secondary | ICD-10-CM | POA: Diagnosis not present

## 2017-01-14 DIAGNOSIS — I129 Hypertensive chronic kidney disease with stage 1 through stage 4 chronic kidney disease, or unspecified chronic kidney disease: Secondary | ICD-10-CM | POA: Diagnosis not present

## 2017-01-14 DIAGNOSIS — J449 Chronic obstructive pulmonary disease, unspecified: Secondary | ICD-10-CM | POA: Diagnosis not present

## 2017-01-15 DIAGNOSIS — E785 Hyperlipidemia, unspecified: Secondary | ICD-10-CM | POA: Diagnosis not present

## 2017-01-15 DIAGNOSIS — Z9981 Dependence on supplemental oxygen: Secondary | ICD-10-CM | POA: Diagnosis not present

## 2017-01-15 DIAGNOSIS — I129 Hypertensive chronic kidney disease with stage 1 through stage 4 chronic kidney disease, or unspecified chronic kidney disease: Secondary | ICD-10-CM | POA: Diagnosis not present

## 2017-01-15 DIAGNOSIS — E1122 Type 2 diabetes mellitus with diabetic chronic kidney disease: Secondary | ICD-10-CM | POA: Diagnosis not present

## 2017-01-15 DIAGNOSIS — R262 Difficulty in walking, not elsewhere classified: Secondary | ICD-10-CM | POA: Diagnosis not present

## 2017-01-15 DIAGNOSIS — N183 Chronic kidney disease, stage 3 (moderate): Secondary | ICD-10-CM | POA: Diagnosis not present

## 2017-01-15 DIAGNOSIS — J449 Chronic obstructive pulmonary disease, unspecified: Secondary | ICD-10-CM | POA: Diagnosis not present

## 2017-01-15 DIAGNOSIS — E669 Obesity, unspecified: Secondary | ICD-10-CM | POA: Diagnosis not present

## 2017-01-15 DIAGNOSIS — M1990 Unspecified osteoarthritis, unspecified site: Secondary | ICD-10-CM | POA: Diagnosis not present

## 2017-01-16 ENCOUNTER — Ambulatory Visit: Payer: Medicare HMO | Admitting: Cardiovascular Disease

## 2017-01-16 DIAGNOSIS — J449 Chronic obstructive pulmonary disease, unspecified: Secondary | ICD-10-CM | POA: Diagnosis not present

## 2017-01-16 DIAGNOSIS — I5042 Chronic combined systolic (congestive) and diastolic (congestive) heart failure: Secondary | ICD-10-CM | POA: Diagnosis not present

## 2017-01-16 DIAGNOSIS — I481 Persistent atrial fibrillation: Secondary | ICD-10-CM | POA: Diagnosis not present

## 2017-01-16 DIAGNOSIS — N183 Chronic kidney disease, stage 3 (moderate): Secondary | ICD-10-CM | POA: Diagnosis not present

## 2017-01-16 DIAGNOSIS — J9601 Acute respiratory failure with hypoxia: Secondary | ICD-10-CM | POA: Diagnosis not present

## 2017-01-22 DIAGNOSIS — E669 Obesity, unspecified: Secondary | ICD-10-CM | POA: Diagnosis not present

## 2017-01-22 DIAGNOSIS — M1990 Unspecified osteoarthritis, unspecified site: Secondary | ICD-10-CM | POA: Diagnosis not present

## 2017-01-22 DIAGNOSIS — I129 Hypertensive chronic kidney disease with stage 1 through stage 4 chronic kidney disease, or unspecified chronic kidney disease: Secondary | ICD-10-CM | POA: Diagnosis not present

## 2017-01-22 DIAGNOSIS — E1122 Type 2 diabetes mellitus with diabetic chronic kidney disease: Secondary | ICD-10-CM | POA: Diagnosis not present

## 2017-01-22 DIAGNOSIS — N183 Chronic kidney disease, stage 3 (moderate): Secondary | ICD-10-CM | POA: Diagnosis not present

## 2017-01-22 DIAGNOSIS — J449 Chronic obstructive pulmonary disease, unspecified: Secondary | ICD-10-CM | POA: Diagnosis not present

## 2017-01-22 DIAGNOSIS — E785 Hyperlipidemia, unspecified: Secondary | ICD-10-CM | POA: Diagnosis not present

## 2017-01-22 DIAGNOSIS — R262 Difficulty in walking, not elsewhere classified: Secondary | ICD-10-CM | POA: Diagnosis not present

## 2017-01-22 DIAGNOSIS — Z9981 Dependence on supplemental oxygen: Secondary | ICD-10-CM | POA: Diagnosis not present

## 2017-02-02 DIAGNOSIS — I481 Persistent atrial fibrillation: Secondary | ICD-10-CM | POA: Diagnosis not present

## 2017-02-02 DIAGNOSIS — J449 Chronic obstructive pulmonary disease, unspecified: Secondary | ICD-10-CM | POA: Diagnosis not present

## 2017-02-02 DIAGNOSIS — J9601 Acute respiratory failure with hypoxia: Secondary | ICD-10-CM | POA: Diagnosis not present

## 2017-02-02 DIAGNOSIS — I5042 Chronic combined systolic (congestive) and diastolic (congestive) heart failure: Secondary | ICD-10-CM | POA: Diagnosis not present

## 2017-02-02 DIAGNOSIS — N183 Chronic kidney disease, stage 3 (moderate): Secondary | ICD-10-CM | POA: Diagnosis not present

## 2017-02-03 DIAGNOSIS — E876 Hypokalemia: Secondary | ICD-10-CM | POA: Diagnosis not present

## 2017-02-03 DIAGNOSIS — I129 Hypertensive chronic kidney disease with stage 1 through stage 4 chronic kidney disease, or unspecified chronic kidney disease: Secondary | ICD-10-CM | POA: Diagnosis not present

## 2017-02-03 DIAGNOSIS — E1122 Type 2 diabetes mellitus with diabetic chronic kidney disease: Secondary | ICD-10-CM | POA: Diagnosis not present

## 2017-02-03 DIAGNOSIS — N184 Chronic kidney disease, stage 4 (severe): Secondary | ICD-10-CM | POA: Diagnosis not present

## 2017-02-03 DIAGNOSIS — N183 Chronic kidney disease, stage 3 (moderate): Secondary | ICD-10-CM | POA: Diagnosis not present

## 2017-02-03 DIAGNOSIS — R809 Proteinuria, unspecified: Secondary | ICD-10-CM | POA: Diagnosis not present

## 2017-02-10 ENCOUNTER — Telehealth: Payer: Self-pay

## 2017-02-10 NOTE — Telephone Encounter (Signed)
Pt wife stated for pt Symbicort refill please call [phone] and ask them to ship right away. Please call if needed.

## 2017-02-10 NOTE — Telephone Encounter (Signed)
Called and spoke with wife who states they need a refill on the Duoneb. Duoneb was d/c'd  07/22/16 by oncology. Wife also states pt is not on any inhalers and that Hospice has d/c'd pt because they state pt is doing well. Wife refused to schedule an appt at this time. Last seen 05/17/16. Please advise if duoneb can be refilled.

## 2017-02-10 NOTE — Progress Notes (Signed)
Cardiology Office Note  Date:  02/11/2017   ID:  Patrick [name], DOB 1931-08-27, MRN [id number]  PCP:  [place] Pink, MD   Chief Complaint  Patient presents with  . other    6 month f/u no complaints today. Meds reviewed verbally with pt.    HPI:  Mr. Patrick [name] is a very pleasant 83 year old gentleman with history of  hypertension,  COPD,  emphysema diabetes,  hyperlipidemia,  GERD  Chronic atrial fibrillation Acute on chronic diastolic CHF, on torsemide  presented to the hospital December 30 2011  in atrial fibrillation with RVR,  started on rate controlling medications and discharged on Cardizem 300 mg daily with anticoagulation (xarelto).   lower extremity edema with Cardizem  Previous diagnosis of Bell's palsy  Hospital admission 01/03/2014 with shortness of breath, diastolic CHF, pleural effusions, anemia, responding well to diuresis  He presents for routine followup of his atrial fibrillation  On admission today we were unable to weigh him, weak, unable to stand without assistance Family presents with him No longer under the care of hospice, was too stable for the family He is not exercising, legs are weak needs assistance with standing and going to the bathroom Not participating in physical therapy this is complete  Labs reviewed with him, hematocrit 35 followed by hematology oncology,,  Previous hospital admission April 2018 with anemia, had blood  transfusion and iron infusion  He was not restarted on anticoagulation secondary to profound anemia Previously on Xarelto 15 mg daily No documentation of GI bleeding Other past medical history reviewed  Weight reportedly stable, wife gives extra torsemide after lunch for any 2 pound weight gain  EKG personally reviewed by myself on todays visit Shows atrial fibrillation with ventricular rate 63 bpm unable to exclude old inferior MI   other past medical history reviewed Admission to the hospital 06/30/2016  for sepsis, pneumonia Creatinine 2.0  also with anemia,  Received one unit packed cells  In the hospital  Iron level low.     admitted in September 2017 with acute on chronic diastolic CHF.  Echo at that time showed normal EF with PASP 59 mmHg.  He was diuresed and sent home on torsemide every other day.   On 01/22/16 he developed sudden onset of SOB and presented to [facility] where he was found to have acute on chronic respiratory failure felt to be multifactorial including some component of CHF exacerbation and worsening anemia with a 3 gram drop from 11.3 during prior admission to 8.6   echo showed normal LV systolic function with an EF of 60-65%, nmo RWMA, mildly dilated left atrium, mildly increased RV wall thickness with mild reduction in systolic function, PASP increased at 70 mmHg.  CT chest showed underlying emphysema, pulmonary nodule measuring 1.8 x1.1 cm extensive atherosclerotic calcification, ascending aorta measuring 4.1x37 cm  Seen by CHF clinic on 01/12/2014. Note was reviewed. No new medication changes at that time  presented to [facility] on 10/12 with increased DOE. pBNP was found to be 2708. CXR showed mild CHF with bilateral pleural effusions. He was diuresed with IV Lasix 40 mg BID with good response. He does have a narrow euvolemic window 2/2 his CKD. He was restarted back on his home dose of torsemide 20 mg bid with double dose prn weight gain. H/H has been drifting down over the past year, has been seen by Oncology in the past with a plan for Procrit once hgb <10. Procrit and iron were given this past admission.  Previously not interested in cardioversion  He has been tolerating anticoagulation at renal dosing.  CT scan of the head in the hospital showed no intracranial process Lab work October 2013, creatinine 2.2, BUN 34 Lab work August 2013 , Hemoglobin A1c 8.1 Total cholesterol 97, LDL 48 creatinine 2.2, BUN in the 40s which is higher than his baseline BUN in the  20s. Wife reports he does not drink very much  Echocardiogram showed ejection fraction greater than 55%, mildly dilated left atrium, otherwise normal study MRI of the brain showed chronic changes without evidence of acute abnormalities   PMH:   has a past medical history of Amputated finger, Anemia of chronic disease, Atypical chest pain, Bell's palsy, Chronic combined systolic and diastolic CHF (congestive heart failure) ([place]), CKD (chronic kidney disease), stage III ([place]), Diabetes mellitus without complication ([place]), GERD (gastroesophageal reflux disease), History of gastroesophageal reflux (GERD), Hyperlipidemia, Hypertension, Permanent atrial fibrillation ([place]), and Pleural effusion, left.  PSH:    Past Surgical History:  Procedure Laterality Date  . APPENDECTOMY    . COLONOSCOPY  09/2011  . ESOPHAGOGASTRODUODENOSCOPY    . FINGER SURGERY     right hand    Current Outpatient Medications  Medication Sig Dispense Refill  . aspirin 81 MG tablet Take 81 mg by mouth daily.    . benzonatate (TESSALON) 200 MG capsule Take 1 capsule (200 mg total) by mouth 3 (three) times daily as needed for cough. 20 capsule 0  . clonazePAM (KLONOPIN) 0.5 MG tablet Take 0.5 mg by mouth 2 (two) times daily.    . cloNIDine (CATAPRES) 0.2 MG tablet Take 1 tablet (0.2 mg total) by mouth 2 (two) times daily. 180 tablet 3  . docusate sodium (COLACE) 100 MG capsule Take 100 mg by mouth daily.    . insulin NPH-regular Human (NOVOLIN 70/30) (70-30) 100 UNIT/ML injection Inject 35 Units into the skin 2 (two) times daily with a meal. 10 mL 11  . ipratropium-albuterol (DUONEB) 0.5-2.5 (3) MG/3ML SOLN Take 3 mLs by nebulization every 4 (four) hours as needed. Patient must make appointment before any further refills. 360 mL 0  . LORazepam (ATIVAN) 0.5 MG tablet Take 0.5 mg by mouth every 8 (eight) hours as needed for anxiety.    Marland Kitchen losartan (COZAAR) 50 MG tablet Take 1 tablet (50 mg total) by mouth daily. 30 tablet 2  .  morphine (ROXANOL) 20 MG/ML concentrated solution Take 0.25 mLs (5 mg total) by mouth every 4 (four) hours as needed for severe pain or shortness of breath. 30 mL 0  . omeprazole (PRILOSEC) 40 MG capsule Take 1 capsule (40 mg total) by mouth daily. 90 capsule 3  . OXYGEN Inhale 2-4 L into the lungs continuous.    . potassium chloride (K-DUR) 10 MEQ tablet Take 1 tablet (10 mEq total) by mouth 2 (two) times daily. 180 tablet 3  . sertraline (ZOLOFT) 25 MG tablet Take 50 mg by mouth daily.    Marland Kitchen torsemide (DEMADEX) 20 MG tablet Take 2 tablets (40 mg total) by mouth 2 (two) times daily as needed. (Patient taking differently: Take 20 mg by mouth daily. ) 120 tablet 3   No current facility-administered medications for this visit.    Facility-Administered Medications Ordered in Other Visits  Medication Dose Route Frequency Provider Last Rate Last Dose  . epoetin alfa (EPOGEN,PROCRIT) injection 40,000 Units  40,000 Units Subcutaneous Once Lloyd Huger, MD         Allergies:   Morphine  Social History:  The patient  reports that he quit smoking about 50 years ago. His smoking use included cigars. He has a 2.50 pack-year smoking history. he has never used smokeless tobacco. He reports that he does not drink alcohol or use drugs.   Family History:   family history includes Diabetes in his mother and sister; Heart attack in his brother; Hypertension in his unknown relative.    Review of Systems: Review of Systems  Constitutional:       Weight gain  Respiratory: Negative.   Cardiovascular: Negative.   Gastrointestinal: Negative.   Musculoskeletal: Negative.        Leg weakness, fall risk  Neurological: Positive for weakness.  Psychiatric/Behavioral: Negative.   All other systems reviewed and are negative.    PHYSICAL EXAM: VS:  BP 108/80 (BP Location: Left Arm, Patient Position: Sitting, Cuff Size: Normal)   Pulse 63   Ht 5\' 8"  (1.727 m)   Wt 195 lb (88.5 kg)   BMI 29.65 kg/m  ,  BMI Body mass index is 29.65 kg/m. GEN: Well nourished,  in no acute distress , obese, on nasal cannula oxygen,  Presents with a walker  HEENT: normal  Neck: no JVD, carotid bruits, or masses Cardiac: Irregularly irregular no murmurs, rubs, or gallops, trace lower extremity edema Respiratory:  Moderately decreased breath sounds throughout, poor inspiratory effort, normal work of breathing GI: soft, nontender, nondistended, + BS MS: no deformity or atrophy  Skin: warm and dry, no rash Neuro:  Strength and sensation are intact Psych: Alert, communicative, slurred speech    Recent Labs: 06/30/2016: B Natriuretic Peptide 780.0 07/01/2016: Magnesium 1.8 08/21/2016: BUN 25; Creatinine, Ser 1.76; Potassium 3.7; Sodium 142 11/21/2016: Hemoglobin 11.8; Platelets 222    Lipid Panel Lab Results  Component Value Date   CHOL 81 01/03/2014   HDL 16 (L) 01/03/2014   LDLCALC 43 01/03/2014   TRIG 111 01/03/2014      Wt Readings from Last 3 Encounters:  02/11/17 195 lb (88.5 kg)  12/23/16 203 lb 2 oz (92.1 kg)  11/21/16 200 lb 8 oz (90.9 kg)       ASSESSMENT AND PLAN:  Persistent atrial fibrillation (HCC) - Plan: EKG 12-Lead Rate well controlled, He has not been on anticoagulation secondary to profound anemia, previous hospitalization 6 months ago Long discussion with family, high risk of stroke.  Recommended we restart Xarelto 15 mg daily  Chronic diastolic heart failure (HCC) Weight stable, tolerating torsemide 20 mg daily, extra torsemide as needed for weight gain  Essential hypertension Blood pressure reasonable, 150s in the morning, 120s in the afternoon  Mixed hyperlipidemia Encouraged him to continue on pravastatin   Type 2 diabetes mellitus with other circulatory complication, with long-term current use of insulin (HCC) Weight is stable, not eating as much No exercise  Anemia of chronic disease Anemia resolved after blood transfusion and iron infusion He has close  follow-up with hematology oncology  CKD (chronic kidney disease), stage III Stable renal function, followed by nephrology most recent creatinine 2.0  Lymphadenopathy followed by oncology CLL     Total encounter time more than 45 minutes  Greater than 50% was spent in counseling and coordination of care with the patient   Disposition:   F/U  6 months   Orders Placed This Encounter  Procedures  . EKG 12-Lead     Signed, Esmond Plants, M.D., Ph.D. 02/11/2017  [place], [place]

## 2017-02-11 ENCOUNTER — Encounter: Payer: Self-pay | Admitting: Cardiovascular Disease

## 2017-02-11 ENCOUNTER — Ambulatory Visit: Payer: Medicare HMO | Admitting: Cardiovascular Disease

## 2017-02-11 VITALS — BP 108/80 | HR 63 | Ht 68.0 in | Wt 195.0 lb

## 2017-02-11 DIAGNOSIS — N183 Chronic kidney disease, stage 3 unspecified: Secondary | ICD-10-CM

## 2017-02-11 DIAGNOSIS — E1122 Type 2 diabetes mellitus with diabetic chronic kidney disease: Secondary | ICD-10-CM

## 2017-02-11 DIAGNOSIS — I481 Persistent atrial fibrillation: Secondary | ICD-10-CM | POA: Diagnosis not present

## 2017-02-11 DIAGNOSIS — Z794 Long term (current) use of insulin: Secondary | ICD-10-CM | POA: Diagnosis not present

## 2017-02-11 DIAGNOSIS — I110 Hypertensive heart disease with heart failure: Secondary | ICD-10-CM

## 2017-02-11 DIAGNOSIS — I5032 Chronic diastolic (congestive) heart failure: Secondary | ICD-10-CM | POA: Diagnosis not present

## 2017-02-11 DIAGNOSIS — I951 Orthostatic hypotension: Secondary | ICD-10-CM

## 2017-02-11 DIAGNOSIS — I4819 Other persistent atrial fibrillation: Secondary | ICD-10-CM

## 2017-02-11 MED ORDER — IPRATROPIUM-ALBUTEROL 0.5-2.5 (3) MG/3ML IN SOLN: 3.0000 mL | RESPIRATORY_TRACT | 0 refills | Status: DC | PRN

## 2017-02-11 MED ORDER — RIVAROXABAN 15 MG PO TABS: 15.0000 mg | ORAL_TABLET | Freq: Every day | ORAL | 11 refills | Status: DC

## 2017-02-11 NOTE — Patient Instructions (Addendum)
Medication Instructions:   Please start xarelto 15 mg  one a day for stroke prevention  Stop aspirin  Labwork:  No new labs needed  Testing/Procedures:  No further testing at this time   Follow-Up: It was a pleasure seeing you in the office today. Please call us if you have new issues that need to be addressed before your next appt.  [phone]  Your physician wants you to follow-up in: 3 months.  You will receive a reminder letter in the mail two months in advance. If you don't receive a letter, please call our office to schedule the follow-up appointment.  If you need a refill on your cardiac medications before your next appointment, please call your pharmacy.

## 2017-02-11 NOTE — Telephone Encounter (Signed)
May refill duonebs, needs to be seen within 3 months for further refills.

## 2017-02-16 DIAGNOSIS — J9601 Acute respiratory failure with hypoxia: Secondary | ICD-10-CM | POA: Diagnosis not present

## 2017-02-16 DIAGNOSIS — J449 Chronic obstructive pulmonary disease, unspecified: Secondary | ICD-10-CM | POA: Diagnosis not present

## 2017-02-16 DIAGNOSIS — I481 Persistent atrial fibrillation: Secondary | ICD-10-CM | POA: Diagnosis not present

## 2017-02-16 DIAGNOSIS — I5042 Chronic combined systolic (congestive) and diastolic (congestive) heart failure: Secondary | ICD-10-CM | POA: Diagnosis not present

## 2017-02-16 DIAGNOSIS — N183 Chronic kidney disease, stage 3 (moderate): Secondary | ICD-10-CM | POA: Diagnosis not present

## 2017-03-04 DIAGNOSIS — J9601 Acute respiratory failure with hypoxia: Secondary | ICD-10-CM | POA: Diagnosis not present

## 2017-03-04 DIAGNOSIS — I5042 Chronic combined systolic (congestive) and diastolic (congestive) heart failure: Secondary | ICD-10-CM | POA: Diagnosis not present

## 2017-03-04 DIAGNOSIS — I481 Persistent atrial fibrillation: Secondary | ICD-10-CM | POA: Diagnosis not present

## 2017-03-04 DIAGNOSIS — J449 Chronic obstructive pulmonary disease, unspecified: Secondary | ICD-10-CM | POA: Diagnosis not present

## 2017-03-04 DIAGNOSIS — N183 Chronic kidney disease, stage 3 (moderate): Secondary | ICD-10-CM | POA: Diagnosis not present

## 2017-03-18 DIAGNOSIS — I5042 Chronic combined systolic (congestive) and diastolic (congestive) heart failure: Secondary | ICD-10-CM | POA: Diagnosis not present

## 2017-03-18 DIAGNOSIS — J9601 Acute respiratory failure with hypoxia: Secondary | ICD-10-CM | POA: Diagnosis not present

## 2017-03-18 DIAGNOSIS — N183 Chronic kidney disease, stage 3 (moderate): Secondary | ICD-10-CM | POA: Diagnosis not present

## 2017-03-18 DIAGNOSIS — I481 Persistent atrial fibrillation: Secondary | ICD-10-CM | POA: Diagnosis not present

## 2017-03-18 DIAGNOSIS — J449 Chronic obstructive pulmonary disease, unspecified: Secondary | ICD-10-CM | POA: Diagnosis not present

## 2017-03-19 ENCOUNTER — Telehealth: Payer: Self-pay | Admitting: Cardiovascular Disease

## 2017-03-19 NOTE — Telephone Encounter (Signed)
Lmov for patient to call and reschedule appointment.

## 2017-03-19 NOTE — Telephone Encounter (Signed)
-----   Message from [place], [place] sent at 03/19/2017 10:12 AM EST ----- Just FYI pt is suppose to f/u in Feb. Coming in January looks like a month earlier. I am assuming that's ok looks like the appointment was already scheduled.   Thanks, [employer]

## 2017-03-20 ENCOUNTER — Ambulatory Visit: Payer: Medicare Other | Admitting: Oncology

## 2017-03-20 ENCOUNTER — Ambulatory Visit: Payer: Medicare Other

## 2017-03-20 ENCOUNTER — Other Ambulatory Visit: Payer: Medicare Other

## 2017-03-27 ENCOUNTER — Other Ambulatory Visit: Payer: Medicare Other

## 2017-03-27 ENCOUNTER — Ambulatory Visit: Payer: Medicare Other | Admitting: Oncology

## 2017-03-27 ENCOUNTER — Ambulatory Visit: Payer: Medicare Other

## 2017-03-28 ENCOUNTER — Other Ambulatory Visit: Payer: Self-pay | Admitting: *Deleted

## 2017-03-28 DIAGNOSIS — D649 Anemia, unspecified: Secondary | ICD-10-CM

## 2017-03-28 NOTE — Progress Notes (Signed)
bc

## 2017-03-30 NOTE — Progress Notes (Signed)
Patrick [name]  [phone] [phone]) [phone]  ID: KENAI FLUEGEL OB: 1932/02/18  MR#: [id number]  LTJ#:[id number]  Patient Care Team: [place] Pink, MD as PCP - General (Family Medicine) Alisa Graff, FNP as Nurse Practitioner (Family Medicine) Idolina Primer, Areta Haber, PA-C as Physician Assistant (Cardiology) Lloyd Huger, MD as Consulting Physician (Hematology) Lavonia Dana, MD as Consulting Physician (Nephrology) Gabriel Carina Betsey Holiday, MD as Physician Assistant (Endocrinology)  CHIEF COMPLAINT:  Iron deficiency anemia, anemia secondary to chronic renal failure.  INTERVAL HISTORY: Patient returns to clinic today for further evaluation, laboratory work, and consideration of continuation of Feraheme or Procrit. He continues to have chronic weakness and fatigue, but this is unchanged. He also continues to have chronic shortness of breath and requires oxygen 24 hours a day.  He has no neurologic complaints. He has a fair appetite, but denies any weight loss. He denies any recent fevers. He denies any chest pain, cough, or hemoptysis. He has no nausea, vomiting, constipation, or diarrhea.  He denies any melena or hematochezia.  He has no urinary complaints. Patient offers no further specific complaints.   REVIEW OF SYSTEMS:   Review of Systems  Constitutional: Positive for malaise/fatigue. Negative for fever and weight loss.  Respiratory: Positive for shortness of breath. Negative for cough.   Cardiovascular: Negative.  Negative for chest pain and leg swelling.  Gastrointestinal: Negative.  Negative for abdominal pain, blood in stool, melena and nausea.  Genitourinary: Negative.   Musculoskeletal: Negative.   Skin: Negative.  Negative for rash.  Neurological: Positive for weakness. Negative for tingling and speech change.  Psychiatric/Behavioral: Negative.  The patient is not nervous/anxious.     As per HPI. Otherwise, a complete review of systems is  negative.  PAST MEDICAL HISTORY: Past Medical History:  Diagnosis Date  . Amputated finger   . Anemia of chronic disease    a. plan of oncology to start Procrit - received during admission 12/2013  . Atypical chest pain    a. 12/2014 Neg CE - in setting of L pleural effusion.  . Bell's palsy   . Chronic combined systolic and diastolic CHF (congestive heart failure) ([place])    a. 11/2012 Echo: EF 60-65%, mod conc LVH, mildly dil LA/RA, mild Ao sclerosis w/o stenosis; b. 11/2014 Echo: EF 40-45%, prob mid-apicalanteroseptal, ant, apical HK, Gr2 DD, mod dil LA, mildly dil RA (technically difficult study); c. echo 11/2015: EF 60-65%, trivial AI, nl RV sys fxan, PASP 59; d. echo 10/17: EF 60-65%, no RWMA, mildly dilated LA, mildly reduced RV sys fxn, PASP 70  . CKD (chronic kidney disease), stage III (HCC)    a. stage III-IV  . Diabetes mellitus without complication ([place])   . GERD (gastroesophageal reflux disease)   . History of gastroesophageal reflux (GERD)   . Hyperlipidemia   . Hypertension   . Permanent atrial fibrillation ([place])    a. Dx 12/2011, Rate-controlled, chronic Xarelto (renal dosing) - CHA2DS2VASc = 5.  . Pleural effusion, left    a. 12/2014 s/p thoracentesis - protein <3, LDH 123, no malignancy.    PAST SURGICAL HISTORY: Past Surgical History:  Procedure Laterality Date  . APPENDECTOMY    . COLONOSCOPY  09/2011  . ESOPHAGOGASTRODUODENOSCOPY    . FINGER SURGERY     right hand    FAMILY HISTORY Family History  Problem Relation Age of Onset  . Heart attack Brother   . Diabetes Mother   . Diabetes Sister   . Hypertension Unknown  ADVANCED DIRECTIVES:    HEALTH MAINTENANCE: Social History   Tobacco Use  . Smoking status: Former Smoker    Packs/day: 0.25    Years: 10.00    Pack years: 2.50    Types: Cigars    Last attempt to quit: 05/06/1966    Years since quitting: 50.9  . Smokeless tobacco: Never Used  Substance Use Topics  . Alcohol use: No     Alcohol/week: 0.0 oz  . Drug use: No    Allergies  Allergen Reactions  . Morphine     Other reaction(s): Other (See Comments) Out of it while in the hospital    Current Outpatient Medications  Medication Sig Dispense Refill  . cloNIDine (CATAPRES) 0.2 MG tablet Take 1 tablet (0.2 mg total) by mouth 2 (two) times daily. 180 tablet 3  . docusate sodium (COLACE) 100 MG capsule Take 100 mg by mouth daily.    . insulin NPH-regular Human (NOVOLIN 70/30) (70-30) 100 UNIT/ML injection Inject 35 Units into the skin 2 (two) times daily with a meal. 10 mL 11  . losartan (COZAAR) 50 MG tablet Take 1 tablet (50 mg total) by mouth daily. 30 tablet 2  . morphine (ROXANOL) 20 MG/ML concentrated solution Take 0.25 mLs (5 mg total) by mouth every 4 (four) hours as needed for severe pain or shortness of breath. 30 mL 0  . omeprazole (PRILOSEC) 40 MG capsule Take 1 capsule (40 mg total) by mouth daily. 90 capsule 3  . OXYGEN Inhale 2-4 L into the lungs continuous.    . sertraline (ZOLOFT) 25 MG tablet Take 50 mg by mouth daily.    Marland Kitchen torsemide (DEMADEX) 20 MG tablet Take 2 tablets (40 mg total) by mouth 2 (two) times daily as needed. (Patient taking differently: Take 20 mg by mouth daily. ) 120 tablet 3  . benzonatate (TESSALON) 200 MG capsule Take 1 capsule (200 mg total) by mouth 3 (three) times daily as needed for cough. (Patient not taking: Reported on 04/01/2017) 20 capsule 0  . clonazePAM (KLONOPIN) 0.5 MG tablet Take 0.5 mg by mouth 2 (two) times daily.    Marland Kitchen ipratropium-albuterol (DUONEB) 0.5-2.5 (3) MG/3ML SOLN Take 3 mLs by nebulization every 4 (four) hours as needed. Patient must make appointment before any further refills. (Patient not taking: Reported on 04/01/2017) 360 mL 0  . LORazepam (ATIVAN) 0.5 MG tablet Take 0.5 mg by mouth every 8 (eight) hours as needed for anxiety.    . potassium chloride (K-DUR) 10 MEQ tablet Take 1 tablet (10 mEq total) by mouth 2 (two) times daily. 180 tablet 3  .  Rivaroxaban (XARELTO) 15 MG TABS tablet Take 1 tablet (15 mg total) by mouth daily with supper. 30 tablet 11   No current facility-administered medications for this visit.    Facility-Administered Medications Ordered in Other Visits  Medication Dose Route Frequency Provider Last Rate Last Dose  . epoetin alfa (EPOGEN,PROCRIT) injection 40,000 Units  40,000 Units Subcutaneous Once Lloyd Huger, MD        OBJECTIVE: Vitals:   04/01/17 1605  BP: (!) 147/84  Pulse: 97  Resp: 18  Temp: (!) 97.5 F (36.4 C)     Body mass index is 28.66 kg/m.    ECOG FS:2 - Symptomatic, <50% confined to bed  General: Well-developed, well-nourished, no acute distress. Eyes: anicteric sclera. Lungs: Clear to auscultation bilaterally. Heart: Regular rate and rhythm. No rubs, murmurs, or gallops. Abdomen: Soft, nontender, nondistended. No organomegaly noted, normoactive bowel sounds.  Musculoskeletal: No edema, cyanosis, or clubbing. Neuro: Alert, answering all questions appropriately. Cranial nerves grossly intact. Skin: No rashes or petechiae noted. Psych: Normal affect.   LAB RESULTS:  Lab Results  Component Value Date   NA 142 08/21/2016   K 3.7 08/21/2016   CL 103 08/21/2016   CO2 31 08/21/2016   GLUCOSE 195 (H) 08/21/2016   BUN 25 (H) 08/21/2016   CREATININE 1.76 (H) 08/21/2016   CALCIUM 9.1 08/21/2016   PROT 7.3 12/20/2015   ALBUMIN 3.3 (L) 12/20/2015   AST 26 12/20/2015   ALT 15 (L) 12/20/2015   ALKPHOS 87 12/20/2015   BILITOT 0.9 12/20/2015   GFRNONAA 34 (L) 08/21/2016   GFRAA 39 (L) 08/21/2016    Lab Results  Component Value Date   WBC 8.9 04/01/2017   NEUTROABS 7.0 (H) 04/01/2017   HGB 13.1 04/01/2017   HCT 39.7 (L) 04/01/2017   MCV 89.0 04/01/2017   PLT 309 04/01/2017   Lab Results  Component Value Date   IRON 43 (L) 04/01/2017   TIBC 311 04/01/2017   IRONPCTSAT 14 (L) 04/01/2017    Lab Results  Component Value Date   FERRITIN 86 04/01/2017      STUDIES: No results found.  ASSESSMENT: Iron deficiency anemia, anemia secondary to chronic renal failure.  PLAN:    1. Iron deficiency anemia, anemia secondary to chronic renal failure: Patient's hemoglobin continues to improve and is now within normal limits.  He does have mildly decreased iron stores.  He does not require Feraheme or Procrit today. He last received Feraheme on Jul 25, 2016. Patient's last injection of Procrit was on September 23, 2016. No intervention is needed at this time. Return to clinic in 6 months for repeat laboratory work and further evaluation.  If patient's hemoglobin continues to be within normal limits, he could possibly be discharged from clinic.   2. Lingula mass/mediastinal lymphadenopathy: Despite concern for underlying malignancy, possibly lymphoma, patient previously elected not to pursue biopsy and continue simple observation. Peripheral blood flow cytometry is negative.   3. Chronic renal insufficiency: Patient's creatinine appears to be at his baseline.  Continue monitoring and treatment per nephrology. 4. CHF: Continue evaluation and treatment per cardiology. 5. Leg numbness: Appears to be positional in nature, therefore likely secondary to vascular insufficiency. Patient has been instructed that if it becomes painful or persistent that he will require evaluation by vascular surgery.  Patient expressed understanding and was in agreement with this plan. He also understands that He can call clinic at any time with any questions, concerns, or complaints.    Lloyd Huger, MD   04/05/2017 10:15 AM

## 2017-04-01 ENCOUNTER — Ambulatory Visit: Payer: Medicare HMO | Admitting: Cardiovascular Disease

## 2017-04-01 ENCOUNTER — Inpatient Hospital Stay: Payer: Medicare HMO | Attending: Oncology

## 2017-04-01 ENCOUNTER — Inpatient Hospital Stay (HOSPITAL_BASED_OUTPATIENT_CLINIC_OR_DEPARTMENT_OTHER): Payer: Medicare HMO | Admitting: Oncology

## 2017-04-01 VITALS — BP 147/84 | HR 97 | Temp 97.5°F | Resp 18 | Wt 188.5 lb

## 2017-04-01 DIAGNOSIS — D631 Anemia in chronic kidney disease: Secondary | ICD-10-CM | POA: Insufficient documentation

## 2017-04-01 DIAGNOSIS — D509 Iron deficiency anemia, unspecified: Secondary | ICD-10-CM | POA: Diagnosis not present

## 2017-04-01 DIAGNOSIS — N183 Chronic kidney disease, stage 3 (moderate): Secondary | ICD-10-CM | POA: Diagnosis not present

## 2017-04-01 DIAGNOSIS — D649 Anemia, unspecified: Secondary | ICD-10-CM

## 2017-04-01 DIAGNOSIS — I5042 Chronic combined systolic (congestive) and diastolic (congestive) heart failure: Secondary | ICD-10-CM | POA: Insufficient documentation

## 2017-04-01 LAB — CBC WITH DIFFERENTIAL/PLATELET
BASOS ABS: 0.1 10*3/uL (ref 0–0.1)
Basophils Relative: 1 %
EOS PCT: 4 %
Eosinophils Absolute: 0.4 10*3/uL (ref 0–0.7)
HEMATOCRIT: 39.7 % — AB (ref 40.0–52.0)
Hemoglobin: 13.1 g/dL (ref 13.0–18.0)
LYMPHS ABS: 0.8 10*3/uL — AB (ref 1.0–3.6)
LYMPHS PCT: 9 %
MCH: 29.5 pg (ref 26.0–34.0)
MCHC: 33.1 g/dL (ref 32.0–36.0)
MCV: 89 fL (ref 80.0–100.0)
Monocytes Absolute: 0.6 10*3/uL (ref 0.2–1.0)
Monocytes Relative: 7 %
NEUTROS ABS: 7 10*3/uL — AB (ref 1.4–6.5)
Neutrophils Relative %: 79 %
Platelets: 309 10*3/uL (ref 150–440)
RBC: 4.46 MIL/uL (ref 4.40–5.90)
RDW: 14.5 % (ref 11.5–14.5)
WBC: 8.9 10*3/uL (ref 3.8–10.6)

## 2017-04-01 LAB — IRON AND TIBC
IRON: 43 ug/dL — AB (ref 45–182)
Saturation Ratios: 14 % — ABNORMAL LOW (ref 17.9–39.5)
TIBC: 311 ug/dL (ref 250–450)
UIBC: 268 ug/dL

## 2017-04-01 LAB — FERRITIN: FERRITIN: 86 ng/mL (ref 24–336)

## 2017-04-02 ENCOUNTER — Telehealth: Payer: Self-pay | Admitting: Cardiovascular Disease

## 2017-04-02 ENCOUNTER — Inpatient Hospital Stay: Payer: Medicare HMO

## 2017-04-02 MED ORDER — RIVAROXABAN 15 MG PO TABS: 15.0000 mg | ORAL_TABLET | Freq: Every day | ORAL | 11 refills | Status: DC

## 2017-04-02 NOTE — Telephone Encounter (Signed)
Refill sent.

## 2017-04-02 NOTE — Telephone Encounter (Signed)
Please review for refill. Thanks!  

## 2017-04-02 NOTE — Telephone Encounter (Signed)
°*  STAT* If patient is at the pharmacy, call can be transferred to refill team.   1. Which medications need to be refilled? (please list name of each medication and dose if known)  Xarelto   2. Which pharmacy/location (including street and city if local pharmacy) is medication to be sent to? [facility] [address]  3. Do they need a 30 day or 90 day supply? 90 day

## 2017-04-04 DIAGNOSIS — J449 Chronic obstructive pulmonary disease, unspecified: Secondary | ICD-10-CM | POA: Diagnosis not present

## 2017-04-04 DIAGNOSIS — J9601 Acute respiratory failure with hypoxia: Secondary | ICD-10-CM | POA: Diagnosis not present

## 2017-04-04 DIAGNOSIS — I481 Persistent atrial fibrillation: Secondary | ICD-10-CM | POA: Diagnosis not present

## 2017-04-04 DIAGNOSIS — I5042 Chronic combined systolic (congestive) and diastolic (congestive) heart failure: Secondary | ICD-10-CM | POA: Diagnosis not present

## 2017-04-04 DIAGNOSIS — N183 Chronic kidney disease, stage 3 (moderate): Secondary | ICD-10-CM | POA: Diagnosis not present

## 2017-04-06 IMAGING — PT NM PET TUM IMG INITIAL (PI) SKULL BASE T - THIGH
1 of 7 series · 1 of 25 positions shown · non-contrast
Comparison: Chest CT on 01/24/2016 and 12/22/2014

CLINICAL DATA: Initial treatment strategy for left lung nodule and
thoracic lymphadenopathy.

EXAM:
NUCLEAR MEDICINE PET SKULL BASE TO THIGH
TECHNIQUE: 12.6 mCi F-18 FDG was injected intravenously. Full-ring PET imaging
was performed from the skull base to thigh after the radiotracer. CT
data was obtained and used for attenuation correction and anatomic
localization.
FASTING BLOOD GLUCOSE:  Value: 147 mg/dl

[Series 603: pet/ct axial · 1 of 288 slices shown]
[im 173/288]
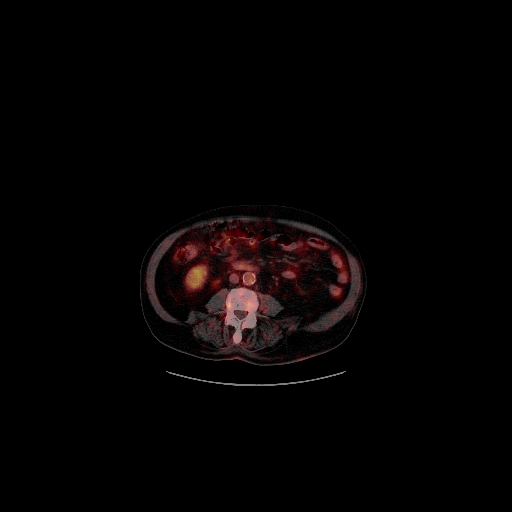

[1 of 25 positions shown; findings below may reference images not displayed]

FINDINGS: NECK

No hypermetabolic lymph nodes in the neck.

CHEST

Diffuse mediastinal lymphadenopathy shows mild progression compared
to previous studies dating back to 12/22/2014. Index lymph node in
the right paratracheal region measures 2.2 cm short axis on image
81/3 compared to 1.8 cm on 6773 exam. Index subcarinal lymph node
measures 2.1 cm on image 98/3 compared to 1.8 cm previously. This
mediastinal lymphadenopathy shows hypermetabolic activity, with
index lymph node in the right paratracheal region with SUV max of
5.0. There is also mild asymmetric hypermetabolic activity within
the left hilum, with SUV max of 4.9.

Small bilateral thyroid nodules, largest in the left lobe measuring
1.8 cm, remains stable and demonstrate absence of FDG uptake.

Bibasilar pleural- parenchymal scarring is again demonstrated. Small
loculated pleural effusion noted within the right major fissure.
Rounded atelectasis in posterior left lower lobe appears stable.
cm pleural-based nodule in the inferior lingula on image 116/3 shows
no hypermetabolic activity, also most consistent with rounded
atelectasis. No suspicious pulmonary nodules or masses identified by
CT.

ABDOMEN/PELVIS

No abnormal hypermetabolic activity within the liver, pancreas,
adrenal glands, or spleen. No hypermetabolic lymph nodes in the
abdomen or pelvis.

No evidence of splenomegaly.  Aortic atherosclerosis.

SKELETON

No focal hypermetabolic activity to suggest skeletal metastasis.
IMPRESSION: Diffuse mediastinal lymphadenopathy shows hypermetabolic activity
and minimal progression since 12/22/2014 exam. Mild hypermetabolic
nodal activity in left hilum. Differential diagnosis includes
inflammatory etiologies and chronic lymphocytic leukemia/ lymphoma.

Bilateral pleural- parenchymal scarring in left lower lobe rounded
atelectasis. No suspicious hypermetabolic pulmonary nodules or
masses identified.

No evidence of hypermetabolic lymphadenopathy within the neck,
abdomen, or pelvis.

## 2017-04-18 DIAGNOSIS — N183 Chronic kidney disease, stage 3 (moderate): Secondary | ICD-10-CM | POA: Diagnosis not present

## 2017-04-18 DIAGNOSIS — J449 Chronic obstructive pulmonary disease, unspecified: Secondary | ICD-10-CM | POA: Diagnosis not present

## 2017-04-18 DIAGNOSIS — J9601 Acute respiratory failure with hypoxia: Secondary | ICD-10-CM | POA: Diagnosis not present

## 2017-04-18 DIAGNOSIS — I481 Persistent atrial fibrillation: Secondary | ICD-10-CM | POA: Diagnosis not present

## 2017-04-18 DIAGNOSIS — I5042 Chronic combined systolic (congestive) and diastolic (congestive) heart failure: Secondary | ICD-10-CM | POA: Diagnosis not present

## 2017-05-01 ENCOUNTER — Ambulatory Visit: Payer: Medicare HMO | Admitting: Cardiovascular Disease

## 2017-05-05 DIAGNOSIS — I5042 Chronic combined systolic (congestive) and diastolic (congestive) heart failure: Secondary | ICD-10-CM | POA: Diagnosis not present

## 2017-05-05 DIAGNOSIS — J449 Chronic obstructive pulmonary disease, unspecified: Secondary | ICD-10-CM | POA: Diagnosis not present

## 2017-05-05 DIAGNOSIS — I481 Persistent atrial fibrillation: Secondary | ICD-10-CM | POA: Diagnosis not present

## 2017-05-05 DIAGNOSIS — J9601 Acute respiratory failure with hypoxia: Secondary | ICD-10-CM | POA: Diagnosis not present

## 2017-05-05 DIAGNOSIS — N183 Chronic kidney disease, stage 3 (moderate): Secondary | ICD-10-CM | POA: Diagnosis not present

## 2017-05-19 DIAGNOSIS — I481 Persistent atrial fibrillation: Secondary | ICD-10-CM | POA: Diagnosis not present

## 2017-05-19 DIAGNOSIS — I5042 Chronic combined systolic (congestive) and diastolic (congestive) heart failure: Secondary | ICD-10-CM | POA: Diagnosis not present

## 2017-05-19 DIAGNOSIS — J449 Chronic obstructive pulmonary disease, unspecified: Secondary | ICD-10-CM | POA: Diagnosis not present

## 2017-05-19 DIAGNOSIS — N183 Chronic kidney disease, stage 3 (moderate): Secondary | ICD-10-CM | POA: Diagnosis not present

## 2017-05-19 DIAGNOSIS — J9601 Acute respiratory failure with hypoxia: Secondary | ICD-10-CM | POA: Diagnosis not present

## 2017-05-21 ENCOUNTER — Telehealth: Payer: Self-pay | Admitting: Internal Medicine

## 2017-05-21 ENCOUNTER — Ambulatory Visit: Payer: Medicare HMO | Admitting: Cardiovascular Disease

## 2017-05-21 NOTE — Telephone Encounter (Signed)
Lmov for patient to call back and reschedule appointment with Dr [name] for today Change in schedule

## 2017-06-02 DIAGNOSIS — N183 Chronic kidney disease, stage 3 (moderate): Secondary | ICD-10-CM | POA: Diagnosis not present

## 2017-06-02 DIAGNOSIS — J9601 Acute respiratory failure with hypoxia: Secondary | ICD-10-CM | POA: Diagnosis not present

## 2017-06-02 DIAGNOSIS — J449 Chronic obstructive pulmonary disease, unspecified: Secondary | ICD-10-CM | POA: Diagnosis not present

## 2017-06-02 DIAGNOSIS — I481 Persistent atrial fibrillation: Secondary | ICD-10-CM | POA: Diagnosis not present

## 2017-06-02 DIAGNOSIS — I5042 Chronic combined systolic (congestive) and diastolic (congestive) heart failure: Secondary | ICD-10-CM | POA: Diagnosis not present

## 2017-06-05 ENCOUNTER — Ambulatory Visit: Payer: Medicare HMO | Admitting: Cardiovascular Disease

## 2017-06-05 ENCOUNTER — Encounter: Payer: Self-pay | Admitting: Cardiovascular Disease

## 2017-06-05 VITALS — BP 146/80 | HR 94 | Ht 68.0 in | Wt 191.0 lb

## 2017-06-05 DIAGNOSIS — D638 Anemia in other chronic diseases classified elsewhere: Secondary | ICD-10-CM

## 2017-06-05 DIAGNOSIS — I5032 Chronic diastolic (congestive) heart failure: Secondary | ICD-10-CM

## 2017-06-05 DIAGNOSIS — J439 Emphysema, unspecified: Secondary | ICD-10-CM | POA: Diagnosis not present

## 2017-06-05 DIAGNOSIS — I481 Persistent atrial fibrillation: Secondary | ICD-10-CM

## 2017-06-05 DIAGNOSIS — I10 Essential (primary) hypertension: Secondary | ICD-10-CM | POA: Diagnosis not present

## 2017-06-05 DIAGNOSIS — I110 Hypertensive heart disease with heart failure: Secondary | ICD-10-CM

## 2017-06-05 DIAGNOSIS — N183 Chronic kidney disease, stage 3 unspecified: Secondary | ICD-10-CM

## 2017-06-05 DIAGNOSIS — E782 Mixed hyperlipidemia: Secondary | ICD-10-CM | POA: Diagnosis not present

## 2017-06-05 DIAGNOSIS — I4819 Other persistent atrial fibrillation: Secondary | ICD-10-CM

## 2017-06-05 MED ORDER — GLUCOSE BLOOD VI STRP: ORAL_STRIP | 6 refills | Status: AC

## 2017-06-05 NOTE — Progress Notes (Signed)
Cardiology Office Note  Date:  06/05/2017   ID:  HARRIET BOLLEN, DOB September 01, 1931, MRN [id number]  PCP:  Maryland Pink, MD   Chief Complaint  Patient presents with  . Other    3 month follow up. Patient denies chest pain and SOB. Meds reviewed verbally with patient.     HPI:  Mr. Rutigliano is a very pleasant 84 year old gentleman with history of  hypertension,  COPD,  emphysema diabetes,  hyperlipidemia,  GERD  Chronic atrial fibrillation Acute on chronic diastolic CHF, on torsemide  presented to the hospital December 30 2011  in atrial fibrillation with RVR,  started on rate controlling medications and discharged on Cardizem 300 mg daily with anticoagulation (xarelto).   lower extremity edema with Cardizem  Previous diagnosis of Bell's palsy  Hospital admission 01/03/2014 with shortness of breath, diastolic CHF, pleural effusions, anemia, responding well to diuresis  He presents for routine followup of his atrial fibrillation  In follow-up today, he presents in a wheelchair Wife reports he is walking several times a day at home with a walker No falls, feels stronger On his last clinic visit was unable to stand without assistance No longer under the care of hospice, was too stable  Denies any shortness of breath, tachycardia, Minimal ankle swelling Takes torsemide daily, sometimes twice a day for any weight gain above 190 pounds Recent lab work reviewed, stable   Tolerating Xarelto 15 daily  Previous hospital admission April 2018 with anemia, had blood  transfusion and iron infusion  EKG personally reviewed by myself on todays visit Shows atrial fibrillation with ventricular rate 94 bpm unable to exclude old inferior MI   other past medical history reviewed Admission to the hospital 06/30/2016 for sepsis, pneumonia Creatinine 2.0  also with anemia,  Received one unit packed cells  In the hospital  Iron level low.     admitted in September 2017 with acute on  chronic diastolic CHF.  Echo at that time showed normal EF with PASP 59 mmHg.  He was diuresed and sent home on torsemide every other day.   On 01/22/16 he developed sudden onset of SOB and presented to [facility] where he was found to have acute on chronic respiratory failure felt to be multifactorial including some component of CHF exacerbation and worsening anemia with a 3 gram drop from 11.3 during prior admission to 8.6   echo showed normal LV systolic function with an EF of 60-65%, nmo RWMA, mildly dilated left atrium, mildly increased RV wall thickness with mild reduction in systolic function, PASP increased at 70 mmHg.  CT chest showed underlying emphysema, pulmonary nodule measuring 1.8 x1.1 cm extensive atherosclerotic calcification, ascending aorta measuring 4.1x37 cm  Seen by CHF clinic on 01/12/2014. Note was reviewed. No new medication changes at that time  presented to [facility] on 10/12 with increased DOE. pBNP was found to be 2708. CXR showed mild CHF with bilateral pleural effusions. He was diuresed with IV Lasix 40 mg BID with good response. He does have a narrow euvolemic window 2/2 his CKD. He was restarted back on his home dose of torsemide 20 mg bid with double dose prn weight gain. H/H has been drifting down over the past year, has been seen by Oncology in the past with a plan for Procrit once hgb <10. Procrit and iron were given this past admission.    Previously not interested in cardioversion  He has been tolerating anticoagulation at renal dosing.  CT scan of the head in  the hospital showed no intracranial process Lab work October 2013, creatinine 2.2, BUN 34 Lab work August 2013 , Hemoglobin A1c 8.1 Total cholesterol 97, LDL 48 creatinine 2.2, BUN in the 40s which is higher than his baseline BUN in the 20s. Wife reports he does not drink very much  Echocardiogram showed ejection fraction greater than 55%, mildly dilated left atrium, otherwise normal study MRI of the  brain showed chronic changes without evidence of acute abnormalities   PMH:   has a past medical history of Amputated finger, Anemia of chronic disease, Atypical chest pain, Bell's palsy, Chronic combined systolic and diastolic CHF (congestive heart failure) ([place]), CKD (chronic kidney disease), stage III ([place]), Diabetes mellitus without complication ([place]), GERD (gastroesophageal reflux disease), History of gastroesophageal reflux (GERD), Hyperlipidemia, Hypertension, Permanent atrial fibrillation ([place]), and Pleural effusion, left.  PSH:    Past Surgical History:  Procedure Laterality Date  . APPENDECTOMY    . COLONOSCOPY  09/2011  . ESOPHAGOGASTRODUODENOSCOPY    . FINGER SURGERY     right hand    Current Outpatient Medications  Medication Sig Dispense Refill  . cloNIDine (CATAPRES) 0.2 MG tablet Take 1 tablet (0.2 mg total) by mouth 2 (two) times daily. 180 tablet 3  . insulin NPH-regular Human (NOVOLIN 70/30) (70-30) 100 UNIT/ML injection Inject 35 Units into the skin 2 (two) times daily with a meal. 10 mL 11  . losartan (COZAAR) 50 MG tablet Take 1 tablet (50 mg total) by mouth daily. 30 tablet 2  . morphine (ROXANOL) 20 MG/ML concentrated solution Take 0.25 mLs (5 mg total) by mouth every 4 (four) hours as needed for severe pain or shortness of breath. 30 mL 0  . omeprazole (PRILOSEC) 40 MG capsule Take 1 capsule (40 mg total) by mouth daily. 90 capsule 3  . OXYGEN Inhale 2-4 L into the lungs continuous.    . Rivaroxaban (XARELTO) 15 MG TABS tablet Take 1 tablet (15 mg total) by mouth daily with supper. 30 tablet 11  . senna-docusate (SENOKOT-S) 8.6-50 MG tablet Take 2 tablets by mouth daily.    . sertraline (ZOLOFT) 25 MG tablet Take 50 mg by mouth daily.    Marland Kitchen torsemide (DEMADEX) 20 MG tablet Take 2 tablets (40 mg total) by mouth 2 (two) times daily as needed. (Patient taking differently: Take 20 mg by mouth daily. ) 120 tablet 3  . potassium chloride (K-DUR) 10 MEQ tablet Take 1  tablet (10 mEq total) by mouth 2 (two) times daily. 180 tablet 3   No current facility-administered medications for this visit.    Facility-Administered Medications Ordered in Other Visits  Medication Dose Route Frequency Provider Last Rate Last Dose  . epoetin alfa (EPOGEN,PROCRIT) injection 40,000 Units  40,000 Units Subcutaneous Once Lloyd Huger, MD         Allergies:   Morphine   Social History:  The patient  reports that he quit smoking about 51 years ago. His smoking use included cigars. He has a 2.50 pack-year smoking history. he has never used smokeless tobacco. He reports that he does not drink alcohol or use drugs.   Family History:   family history includes Diabetes in his mother and sister; Heart attack in his brother; Hypertension in his unknown relative.    Review of Systems: Review of Systems  Constitutional: Negative.        Weight gain  Respiratory: Negative.   Cardiovascular: Negative.   Gastrointestinal: Negative.   Musculoskeletal: Negative.  Leg weakness, fall risk  Psychiatric/Behavioral: Negative.   All other systems reviewed and are negative.    PHYSICAL EXAM: VS:  BP (!) 146/80 (BP Location: Right Arm, Patient Position: Sitting, Cuff Size: Normal)   Pulse 94   Ht 5\' 8"  (1.727 m)   Wt 191 lb (86.6 kg)   BMI 29.04 kg/m  , BMI Body mass index is 29.04 kg/m. Constitutional:  oriented to person, place, and time. No distress. Presenting in a wheelchair HENT:  Head: Normocephalic and atraumatic.  Eyes:  no discharge. No scleral icterus.  Neck: Normal range of motion. Neck supple. No JVD present.  Cardiovascular: Irregularly irregular,  normal heart sounds and intact distal pulses. Exam reveals no gallop and no friction rub. No edema No murmur heard. Pulmonary/Chest: Effort normal and breath sounds normal. No stridor. No respiratory distress.  no wheezes.  no rales.  no tenderness.  Abdominal: Soft.  no distension.  no tenderness.   Musculoskeletal: Normal range of motion.  no  tenderness or deformity.  Neurological:  normal muscle tone. Coordination normal. No atrophy Skin: Skin is warm and dry. No rash noted. not diaphoretic.  Psychiatric:  normal mood and affect. behavior is normal. Thought content normal.   Recent Labs: 06/30/2016: B Natriuretic Peptide 780.0 07/01/2016: Magnesium 1.8 08/21/2016: BUN 25; Creatinine, Ser 1.76; Potassium 3.7; Sodium 142 04/01/2017: Hemoglobin 13.1; Platelets 309    Lipid Panel Lab Results  Component Value Date   CHOL 81 01/03/2014   HDL 16 (L) 01/03/2014   LDLCALC 43 01/03/2014   TRIG 111 01/03/2014      Wt Readings from Last 3 Encounters:  06/05/17 191 lb (86.6 kg)  04/01/17 188 lb 8 oz (85.5 kg)  02/11/17 195 lb (88.5 kg)       ASSESSMENT AND PLAN:  Persistent atrial fibrillation ([place]) - Plan: EKG 12-Lead Rate well controlled, Xarelto 15 mg daily, no recent fall  No changes to his medications  Chronic diastolic heart failure (HCC) Weight stable, tolerating torsemide 20 mg daily, extra torsemide as needed for weight gain Does extra for weight gain, weight 190 No changes made  Essential hypertension Blood pressure is well controlled on today's visit. No changes made to the medications. 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    Signed, Esmond Plants, M.D., Ph.D. 06/05/2017  [place], [place]

## 2017-06-05 NOTE — Patient Instructions (Signed)

## 2017-06-11 ENCOUNTER — Telehealth: Payer: Self-pay | Admitting: Cardiovascular Disease

## 2017-06-11 NOTE — Telephone Encounter (Signed)
Informed pt's wife, Loletha Carrow, that Dr. [name] sent in prescription for glucose blood test strips to [facility] on 06/05/17. Wife appreciative

## 2017-06-11 NOTE — Telephone Encounter (Signed)
Pt spouse asking if we may be able to send strips for patient to check his sugar  She is asking if we can send this to [facility] on [address]

## 2017-06-16 DIAGNOSIS — N183 Chronic kidney disease, stage 3 (moderate): Secondary | ICD-10-CM | POA: Diagnosis not present

## 2017-06-16 DIAGNOSIS — J449 Chronic obstructive pulmonary disease, unspecified: Secondary | ICD-10-CM | POA: Diagnosis not present

## 2017-06-16 DIAGNOSIS — I5042 Chronic combined systolic (congestive) and diastolic (congestive) heart failure: Secondary | ICD-10-CM | POA: Diagnosis not present

## 2017-06-16 DIAGNOSIS — J9601 Acute respiratory failure with hypoxia: Secondary | ICD-10-CM | POA: Diagnosis not present

## 2017-06-16 DIAGNOSIS — I481 Persistent atrial fibrillation: Secondary | ICD-10-CM | POA: Diagnosis not present

## 2017-06-23 ENCOUNTER — Ambulatory Visit: Payer: Medicare Other | Admitting: Family

## 2017-06-24 ENCOUNTER — Telehealth: Payer: Self-pay | Admitting: Cardiovascular Disease

## 2017-06-24 NOTE — Telephone Encounter (Signed)
accu ch eck strip   *STAT* If patient is at the pharmacy, call can be transferred to refill team.   1. Which medications need to be refilled? (please list name of each medication and dose if known) ACCU check strips   2. Which pharmacy/location (including street and city if local pharmacy) is medication to be sent to? [place]   3. Do they need a 30 day or 90 day supply? [place]

## 2017-06-24 NOTE — Telephone Encounter (Signed)
I called and spoke with the patient's wife (ok per DPR). I advised her that we do not refill accu check strips and that this will need to be done through the patient's primary care. She verbalized understanding.

## 2017-07-03 DIAGNOSIS — I5042 Chronic combined systolic (congestive) and diastolic (congestive) heart failure: Secondary | ICD-10-CM | POA: Diagnosis not present

## 2017-07-03 DIAGNOSIS — J9601 Acute respiratory failure with hypoxia: Secondary | ICD-10-CM | POA: Diagnosis not present

## 2017-07-03 DIAGNOSIS — N183 Chronic kidney disease, stage 3 (moderate): Secondary | ICD-10-CM | POA: Diagnosis not present

## 2017-07-03 DIAGNOSIS — J449 Chronic obstructive pulmonary disease, unspecified: Secondary | ICD-10-CM | POA: Diagnosis not present

## 2017-07-03 DIAGNOSIS — I481 Persistent atrial fibrillation: Secondary | ICD-10-CM | POA: Diagnosis not present

## 2017-07-04 NOTE — Progress Notes (Signed)
Patient ID: Patrick [name], male    DOB: [birth year], 84 y.o.   MRN: [id number]  HPI  Patrick [name] is a 84 y/o male with a history of chronic kidney disease (stage III-IV), diabetes on insulin, HTN, persistent atrial fibrillation, hyperlipidemia, anemia, remote tobacco use and chronic diastolic heart failure.  Last echo done 01/22/16 which showed an EF of 60-65%without any aortic stenosis and trivial tricuspid regurgitation. Elevated PA pressure of 70 mm Hg. This is an improvement from his previous echocardiogram which showed an EF of 40-45%.  Patient has not been admitted or been in the ED in the last 6 months.   Returns today for a follow-up visit with a chief complaint of minimal shortness of breath upon moderate exertion. He describes this as chronic in nature having been present for several years. He has associated fatigue, edema and gradual weight gain along with this. He denies any difficulty sleeping, abdominal distention, palpitations, dizziness or chest tightness. Had an episode of chest pain the other other night and took 0.78ml of roxanol with good relief. No longer receiving home health services.   Past Medical History:  Diagnosis Date  . Amputated finger   . Anemia of chronic disease    a. plan of oncology to start Procrit - received during admission 12/2013  . Atypical chest pain    a. 12/2014 Neg CE - in setting of L pleural effusion.  . Bell's palsy   . Chronic combined systolic and diastolic CHF (congestive heart failure) ([place])    a. 11/2012 Echo: EF 60-65%, mod conc LVH, mildly dil LA/RA, mild Ao sclerosis w/o stenosis; b. 11/2014 Echo: EF 40-45%, prob mid-apicalanteroseptal, ant, apical HK, Gr2 DD, mod dil LA, mildly dil RA (technically difficult study); c. echo 11/2015: EF 60-65%, trivial AI, nl RV sys fxan, PASP 59; d. echo 10/17: EF 60-65%, no RWMA, mildly dilated LA, mildly reduced RV sys fxn, PASP 70  . CKD (chronic kidney disease), stage III (HCC)    a. stage  III-IV  . Diabetes mellitus without complication ([place])   . GERD (gastroesophageal reflux disease)   . History of gastroesophageal reflux (GERD)   . Hyperlipidemia   . Hypertension   . Permanent atrial fibrillation ([place])    a. Dx 12/2011, Rate-controlled, chronic Xarelto (renal dosing) - CHA2DS2VASc = 5.  . Pleural effusion, left    a. 12/2014 s/p thoracentesis - protein <3, LDH 123, no malignancy.   Past Surgical History:  Procedure Laterality Date  . APPENDECTOMY    . COLONOSCOPY  09/2011  . ESOPHAGOGASTRODUODENOSCOPY    . FINGER SURGERY     right hand   Family History  Problem Relation Age of Onset  . Heart attack Brother   . Diabetes Mother   . Diabetes Sister   . Hypertension Unknown    Social History   Tobacco Use  . Smoking status: Former Smoker    Packs/day: 0.25    Years: 10.00    Pack years: 2.50    Types: Cigars    Last attempt to quit: 05/06/1966    Years since quitting: 51.1  . Smokeless tobacco: Never Used  Substance Use Topics  . Alcohol use: No    Alcohol/week: 0.0 oz   Allergies  Allergen Reactions  . Morphine     Other reaction(s): Other (See Comments) Out of it while in the hospital   Prior to Admission medications   Medication Sig Start Date End Date Taking? Authorizing Provider  cloNIDine (CATAPRES) 0.2 MG  tablet Take 1 tablet (0.2 mg total) by mouth 2 (two) times daily. 08/13/16  Yes Minna Merritts, MD  glucose blood (ACCU-CHEK SMARTVIEW) test strip Use one strip  To check  Glucose twice a day 06/05/17  Yes Gollan, Kathlene November, MD  insulin NPH-regular Human (NOVOLIN 70/30) (70-30) 100 UNIT/ML injection Inject 35 Units into the skin 2 (two) times daily with a meal. 07/02/16  Yes Gladstone Lighter, MD  losartan (COZAAR) 50 MG tablet Take 1 tablet (50 mg total) by mouth daily. 07/02/16  Yes Gladstone Lighter, MD  morphine (ROXANOL) 20 MG/ML concentrated solution Take 0.25 mLs (5 mg total) by mouth every 4 (four) hours as needed for severe pain or  shortness of breath. 07/02/16  Yes Gladstone Lighter, MD  omeprazole (PRILOSEC) 40 MG capsule Take 1 capsule (40 mg total) by mouth daily. 04/15/16  Yes Ileen Kahre A, FNP  OXYGEN Inhale 2-4 L into the lungs continuous.   Yes [provider]  potassium chloride (K-DUR) 10 MEQ tablet Take 1 tablet (10 mEq total) by mouth 2 (two) times daily. 07/18/16 07/07/17 Yes Gollan, Kathlene November, MD  Rivaroxaban (XARELTO) 15 MG TABS tablet Take 1 tablet (15 mg total) by mouth daily with supper. 04/02/17  Yes Gollan, Kathlene November, MD  senna-docusate (SENOKOT-S) 8.6-50 MG tablet Take 2 tablets by mouth daily.   Yes [provider]  sertraline (ZOLOFT) 25 MG tablet Take 50 mg by mouth daily.   Yes [provider]  torsemide (DEMADEX) 20 MG tablet Take 2 tablets (40 mg total) by mouth 2 (two) times daily as needed. Patient taking differently: Take 20 mg by mouth daily.  07/18/16  Yes Minna Merritts, MD    Review of Systems  Constitutional: Positive for fatigue. Negative for appetite change.  HENT: Negative for congestion, postnasal drip and sore throat.   Eyes: Negative.   Respiratory: Positive for shortness of breath. Negative for chest tightness.   Cardiovascular: Positive for leg swelling. Negative for chest pain and palpitations.  Gastrointestinal: Negative for abdominal distention and abdominal pain.  Endocrine: Negative.   Genitourinary: Negative.   Musculoskeletal: Negative for back pain and neck pain.  Skin: Negative.   Allergic/Immunologic: Negative.   Neurological: Negative for dizziness and light-headedness.  Hematological: Negative for adenopathy. Bruises/bleeds easily.  Psychiatric/Behavioral: Negative for dysphoric mood and sleep disturbance. The patient is not nervous/anxious.    Vitals:   07/07/17 1540  BP: 131/86  Pulse: 70  Resp: 18  SpO2: 100%  Weight: 196 lb 6 oz (89.1 kg)  Height: 5\' 8"  (1.727 m)   Wt Readings from Last 3 Encounters:  07/07/17 196 lb 6 oz  (89.1 kg)  06/05/17 191 lb (86.6 kg)  04/01/17 188 lb 8 oz (85.5 kg)   Lab Results  Component Value Date   CREATININE 1.76 (H) 08/21/2016   CREATININE 1.51 (H) 08/13/2016   CREATININE 2.07 (H) 07/02/2016    Physical Exam  Constitutional: He is oriented to person, place, and time. He appears well-developed and well-nourished.  HENT:  Head: Normocephalic and atraumatic.  Neck: Normal range of motion. Neck supple. No JVD present.  Cardiovascular: Normal rate and regular rhythm.  Pulmonary/Chest: Effort normal. He has no wheezes. He has no rales.  Abdominal: Soft. He exhibits no distension. There is no tenderness.  Musculoskeletal: He exhibits edema (3+ pitting edema in bilateral lower legs). He exhibits no tenderness.  Neurological: He is alert and oriented to person, place, and time.  Skin: Skin is warm and  dry.  Psychiatric: He has a normal mood and affect. His behavior is normal. Thought content normal.  Nursing note and vitals reviewed.  Assessment & Plan:  1: Chronic heart failure with preserved ejection fraction-  - NYHA class II - mildly fluid overloaded today with edema in lower legs.  - Continue daily weights and call for an overnight weight gain of >2 pounds or weekly weight gain of >5 pounds.  - Weight up 5 pounds from 06/05/17  - elevate legs when sitting at home as much as possible; says that he hasn't propped them up much today - has TED hose at home and his wife says that she'll start putting them on every morning with removal at bedtime - confirmed with wife that he is taking 40mg  torsemide BID for wt> 198 pounds. Wt >193 pounds, he takes 40mg  torsemide AM and weight <193 pounds, take 20mg  torsemide AM; taking extra potassium as well.  - does try to stay active at home with doing some walking with his walker - not adding salt to his food - last saw cardiologist [name]) on 06/05/17 - saw pulmonologist Alva Garnet) 05/17/16  2: HTN- - Blood pressure looks good today -  saw PCP Kary Kos) 11/06/16 - BMP from 08/21/16 reviewed and showed sodium 142, potassium 3.7 and GFR 34  3: CKD, stage III-IV- - remains uninterested in dialysis - sees nephrology Juleen China) May 2019 - will request most recent labs from nephrology  4: Diabetes- - blood sugar at home was 190 this morning - checking blood sugar twice a day  - using insulin when sugar is over 150  Reviewed medication list with wife.   Return in 6 months or sooner for any questions/problems before then.

## 2017-07-07 ENCOUNTER — Encounter: Payer: Self-pay | Admitting: Family

## 2017-07-07 ENCOUNTER — Ambulatory Visit: Payer: Medicare HMO | Attending: Family | Admitting: Family

## 2017-07-07 VITALS — BP 131/86 | HR 70 | Resp 18 | Ht 68.0 in | Wt 196.4 lb

## 2017-07-07 DIAGNOSIS — I481 Persistent atrial fibrillation: Secondary | ICD-10-CM | POA: Insufficient documentation

## 2017-07-07 DIAGNOSIS — Z87891 Personal history of nicotine dependence: Secondary | ICD-10-CM | POA: Insufficient documentation

## 2017-07-07 DIAGNOSIS — J90 Pleural effusion, not elsewhere classified: Secondary | ICD-10-CM | POA: Diagnosis not present

## 2017-07-07 DIAGNOSIS — E1122 Type 2 diabetes mellitus with diabetic chronic kidney disease: Secondary | ICD-10-CM | POA: Diagnosis not present

## 2017-07-07 DIAGNOSIS — Z79899 Other long term (current) drug therapy: Secondary | ICD-10-CM | POA: Insufficient documentation

## 2017-07-07 DIAGNOSIS — N184 Chronic kidney disease, stage 4 (severe): Secondary | ICD-10-CM | POA: Insufficient documentation

## 2017-07-07 DIAGNOSIS — Z8249 Family history of ischemic heart disease and other diseases of the circulatory system: Secondary | ICD-10-CM | POA: Insufficient documentation

## 2017-07-07 DIAGNOSIS — I482 Chronic atrial fibrillation: Secondary | ICD-10-CM | POA: Diagnosis not present

## 2017-07-07 DIAGNOSIS — Z992 Dependence on renal dialysis: Secondary | ICD-10-CM | POA: Diagnosis not present

## 2017-07-07 DIAGNOSIS — I130 Hypertensive heart and chronic kidney disease with heart failure and stage 1 through stage 4 chronic kidney disease, or unspecified chronic kidney disease: Secondary | ICD-10-CM | POA: Insufficient documentation

## 2017-07-07 DIAGNOSIS — Z794 Long term (current) use of insulin: Secondary | ICD-10-CM | POA: Diagnosis not present

## 2017-07-07 DIAGNOSIS — Z9981 Dependence on supplemental oxygen: Secondary | ICD-10-CM | POA: Insufficient documentation

## 2017-07-07 DIAGNOSIS — Z833 Family history of diabetes mellitus: Secondary | ICD-10-CM | POA: Insufficient documentation

## 2017-07-07 DIAGNOSIS — K219 Gastro-esophageal reflux disease without esophagitis: Secondary | ICD-10-CM | POA: Diagnosis not present

## 2017-07-07 DIAGNOSIS — N183 Chronic kidney disease, stage 3 unspecified: Secondary | ICD-10-CM

## 2017-07-07 DIAGNOSIS — E785 Hyperlipidemia, unspecified: Secondary | ICD-10-CM | POA: Insufficient documentation

## 2017-07-07 DIAGNOSIS — I10 Essential (primary) hypertension: Secondary | ICD-10-CM

## 2017-07-07 DIAGNOSIS — Z885 Allergy status to narcotic agent status: Secondary | ICD-10-CM | POA: Diagnosis not present

## 2017-07-07 DIAGNOSIS — I5042 Chronic combined systolic (congestive) and diastolic (congestive) heart failure: Secondary | ICD-10-CM | POA: Insufficient documentation

## 2017-07-07 DIAGNOSIS — Z7901 Long term (current) use of anticoagulants: Secondary | ICD-10-CM | POA: Diagnosis not present

## 2017-07-07 DIAGNOSIS — I5032 Chronic diastolic (congestive) heart failure: Secondary | ICD-10-CM

## 2017-07-07 DIAGNOSIS — D649 Anemia, unspecified: Secondary | ICD-10-CM | POA: Diagnosis not present

## 2017-07-07 DIAGNOSIS — Z9889 Other specified postprocedural states: Secondary | ICD-10-CM | POA: Insufficient documentation

## 2017-07-07 NOTE — Patient Instructions (Signed)
Continue weighing daily and call for an overnight weight gain of > 2 pounds or a weekly weight gain of >5 pounds. 

## 2017-07-14 ENCOUNTER — Telehealth: Payer: Self-pay | Admitting: Cardiovascular Disease

## 2017-07-14 ENCOUNTER — Other Ambulatory Visit: Payer: Self-pay

## 2017-07-14 MED ORDER — TORSEMIDE 20 MG PO TABS: 40.0000 mg | ORAL_TABLET | Freq: Two times a day (BID) | ORAL | 3 refills | Status: DC | PRN

## 2017-07-14 NOTE — Telephone Encounter (Signed)
°*  STAT* If patient is at the pharmacy, call can be transferred to refill team.   1. Which medications need to be refilled? (please list name of each medication and dose if known) torsemide 20 MG   2. Which pharmacy/location (including street and city if local pharmacy) is medication to be sent to? [facility] on [address]  3. Do they need a 30 day or 90 day supply? [place]

## 2017-07-14 NOTE — Telephone Encounter (Signed)
torsemide (DEMADEX) 20 MG tablet 120 tablet 3 07/14/2017    Sig - Route: Take 2 tablets (40 mg total) by mouth 2 (two) times daily as needed. - Oral   Sent to pharmacy as: torsemide (DEMADEX) 20 MG tablet   E-Prescribing Status: Sent to pharmacy (07/14/2017 11:03 AM EDT)   Pharmacy   [place] [facility], [place]

## 2017-07-17 DIAGNOSIS — I481 Persistent atrial fibrillation: Secondary | ICD-10-CM | POA: Diagnosis not present

## 2017-07-17 DIAGNOSIS — I5042 Chronic combined systolic (congestive) and diastolic (congestive) heart failure: Secondary | ICD-10-CM | POA: Diagnosis not present

## 2017-07-17 DIAGNOSIS — J9601 Acute respiratory failure with hypoxia: Secondary | ICD-10-CM | POA: Diagnosis not present

## 2017-07-17 DIAGNOSIS — J449 Chronic obstructive pulmonary disease, unspecified: Secondary | ICD-10-CM | POA: Diagnosis not present

## 2017-07-17 DIAGNOSIS — N183 Chronic kidney disease, stage 3 (moderate): Secondary | ICD-10-CM | POA: Diagnosis not present

## 2017-07-28 DIAGNOSIS — N2581 Secondary hyperparathyroidism of renal origin: Secondary | ICD-10-CM | POA: Diagnosis not present

## 2017-07-28 DIAGNOSIS — N183 Chronic kidney disease, stage 3 (moderate): Secondary | ICD-10-CM | POA: Diagnosis not present

## 2017-07-28 DIAGNOSIS — E1122 Type 2 diabetes mellitus with diabetic chronic kidney disease: Secondary | ICD-10-CM | POA: Diagnosis not present

## 2017-07-28 DIAGNOSIS — I129 Hypertensive chronic kidney disease with stage 1 through stage 4 chronic kidney disease, or unspecified chronic kidney disease: Secondary | ICD-10-CM | POA: Diagnosis not present

## 2017-07-28 DIAGNOSIS — R809 Proteinuria, unspecified: Secondary | ICD-10-CM | POA: Diagnosis not present

## 2017-08-02 DIAGNOSIS — N183 Chronic kidney disease, stage 3 (moderate): Secondary | ICD-10-CM | POA: Diagnosis not present

## 2017-08-02 DIAGNOSIS — J449 Chronic obstructive pulmonary disease, unspecified: Secondary | ICD-10-CM | POA: Diagnosis not present

## 2017-08-02 DIAGNOSIS — I481 Persistent atrial fibrillation: Secondary | ICD-10-CM | POA: Diagnosis not present

## 2017-08-02 DIAGNOSIS — J9601 Acute respiratory failure with hypoxia: Secondary | ICD-10-CM | POA: Diagnosis not present

## 2017-08-02 DIAGNOSIS — I5042 Chronic combined systolic (congestive) and diastolic (congestive) heart failure: Secondary | ICD-10-CM | POA: Diagnosis not present

## 2017-08-03 ENCOUNTER — Other Ambulatory Visit: Payer: Self-pay | Admitting: Cardiovascular Disease

## 2017-08-04 ENCOUNTER — Encounter: Payer: Self-pay | Admitting: Cardiovascular Disease

## 2017-08-04 ENCOUNTER — Other Ambulatory Visit: Payer: Self-pay

## 2017-08-04 NOTE — Telephone Encounter (Signed)
This encounter was created in error - please disregard.

## 2017-08-16 DIAGNOSIS — J449 Chronic obstructive pulmonary disease, unspecified: Secondary | ICD-10-CM | POA: Diagnosis not present

## 2017-08-16 DIAGNOSIS — J9601 Acute respiratory failure with hypoxia: Secondary | ICD-10-CM | POA: Diagnosis not present

## 2017-08-16 DIAGNOSIS — I5042 Chronic combined systolic (congestive) and diastolic (congestive) heart failure: Secondary | ICD-10-CM | POA: Diagnosis not present

## 2017-08-16 DIAGNOSIS — I481 Persistent atrial fibrillation: Secondary | ICD-10-CM | POA: Diagnosis not present

## 2017-08-16 DIAGNOSIS — N183 Chronic kidney disease, stage 3 (moderate): Secondary | ICD-10-CM | POA: Diagnosis not present

## 2017-08-20 ENCOUNTER — Other Ambulatory Visit: Payer: Self-pay | Admitting: Cardiovascular Disease

## 2017-08-21 ENCOUNTER — Other Ambulatory Visit: Payer: Self-pay

## 2017-08-29 ENCOUNTER — Other Ambulatory Visit: Payer: Self-pay

## 2017-08-29 MED ORDER — CLONIDINE HCL 0.2 MG PO TABS: 0.2000 mg | ORAL_TABLET | Freq: Two times a day (BID) | ORAL | 0 refills | Status: DC

## 2017-08-29 MED ORDER — POTASSIUM CHLORIDE ER 10 MEQ PO TBCR: 10.0000 meq | EXTENDED_RELEASE_TABLET | Freq: Two times a day (BID) | ORAL | 0 refills | Status: DC

## 2017-08-29 MED ORDER — TORSEMIDE 20 MG PO TABS: 40.0000 mg | ORAL_TABLET | Freq: Two times a day (BID) | ORAL | 3 refills | Status: DC | PRN

## 2017-08-29 MED ORDER — OMEPRAZOLE 40 MG PO CPDR: 40.0000 mg | DELAYED_RELEASE_CAPSULE | Freq: Every day | ORAL | 0 refills | Status: DC

## 2017-08-29 NOTE — Telephone Encounter (Signed)
Tried to call patients wife but he answered the phone and it was difficult to understand him. Will check with CHF clinic to see if they can clarify as well.

## 2017-08-29 NOTE — Telephone Encounter (Signed)
Will send in refill

## 2017-08-29 NOTE — Telephone Encounter (Signed)
Received a Fax from [employer] for a refill on patients Torsemide 20mg  tablets.  I just want to make sure instructions are correct.   Per Dr. Donivan Scull last office note on 06/05/2017 patient was to take 20MG  daily and an extra as need for weight gain.   When patient seen Darylene Price, She confirmed with wife on 07/07/2017 that he is taking 40mg  torsemide BID for wt> 198 pounds. Wt >193 pounds, he takes 40mg  torsemide AM and weight <193 pounds, take 20mg  torsemide AM; taking extra potassium as well.   Attempted to call patient to verify with him as well but he stated "I take what my wife gives me and she is not here right now. She will be back by 2:30pm-3:00pm.

## 2017-09-02 DIAGNOSIS — I481 Persistent atrial fibrillation: Secondary | ICD-10-CM | POA: Diagnosis not present

## 2017-09-02 DIAGNOSIS — I5042 Chronic combined systolic (congestive) and diastolic (congestive) heart failure: Secondary | ICD-10-CM | POA: Diagnosis not present

## 2017-09-02 DIAGNOSIS — J9601 Acute respiratory failure with hypoxia: Secondary | ICD-10-CM | POA: Diagnosis not present

## 2017-09-02 DIAGNOSIS — J449 Chronic obstructive pulmonary disease, unspecified: Secondary | ICD-10-CM | POA: Diagnosis not present

## 2017-09-02 DIAGNOSIS — N183 Chronic kidney disease, stage 3 (moderate): Secondary | ICD-10-CM | POA: Diagnosis not present

## 2017-09-09 IMAGING — US US SOFT TISSUE HEAD/NECK
1 series · 12 of 25 positions shown · non-contrast
Comparison: CT of the chest on 01/24/2016

CLINICAL DATA: Incidental on CT.

EXAM:
THYROID ULTRASOUND
TECHNIQUE: Ultrasound examination of the thyroid gland and adjacent soft
tissues was performed.

[Series 1: us soft tissue head/neck · 0.08mm/px · 12 of 91 slices shown]
[im 4/91]
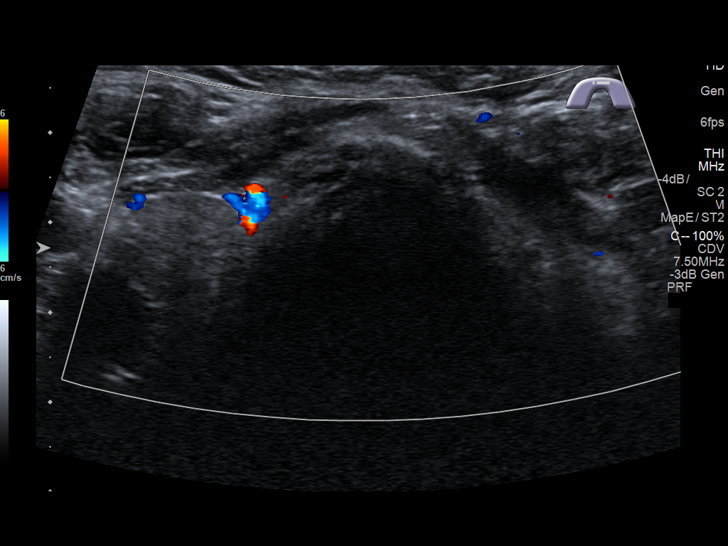
[im 12/91]
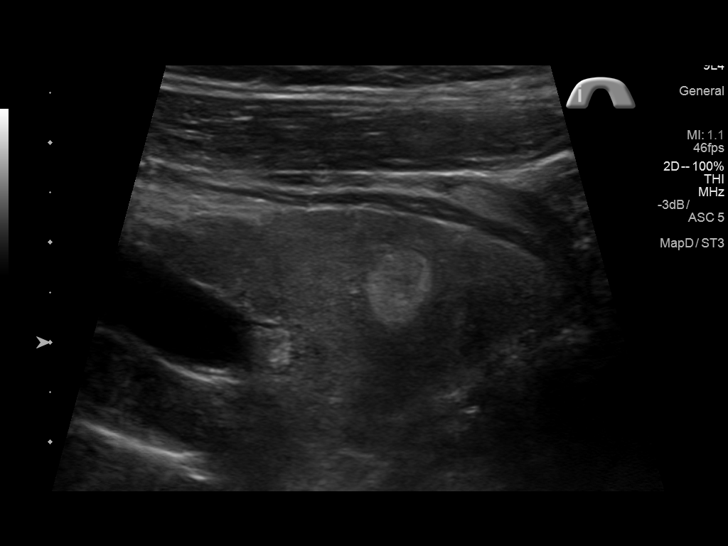
[im 19/91]
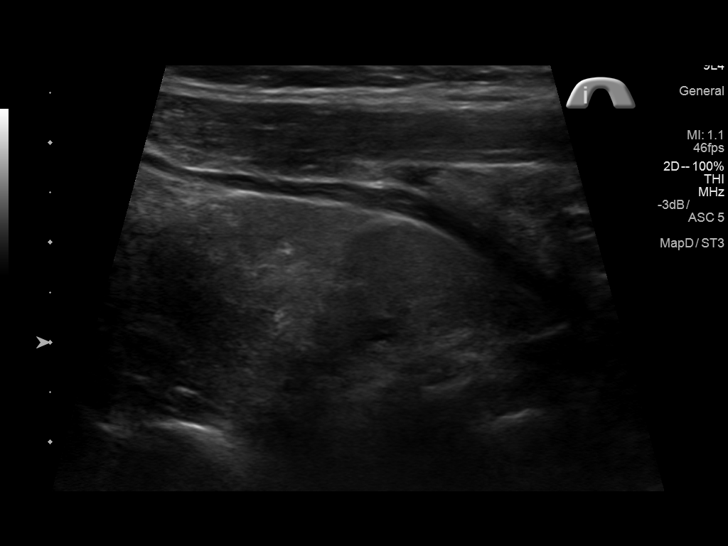
[im 27/91]
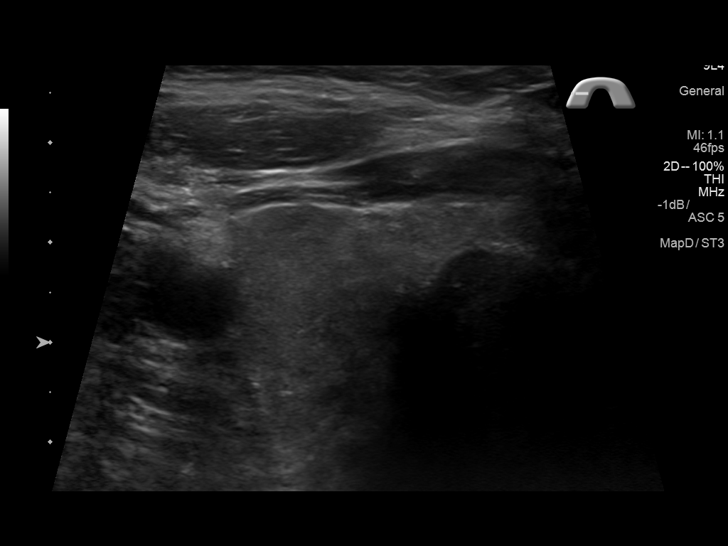
[im 34/91]
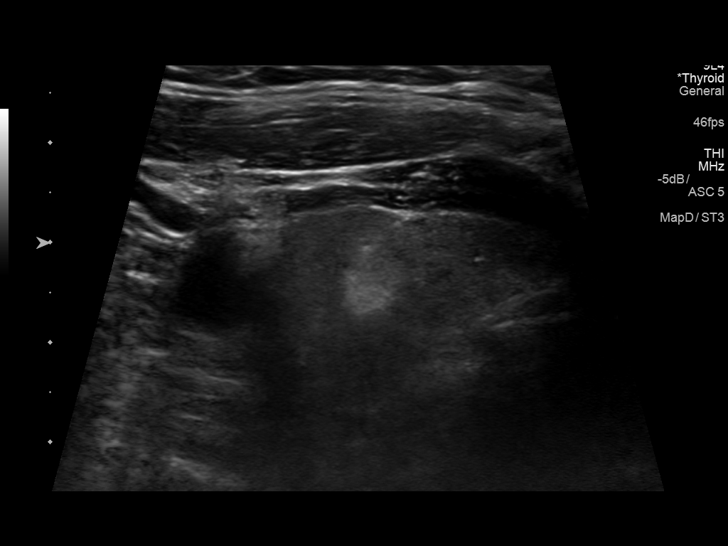
[im 42/91]
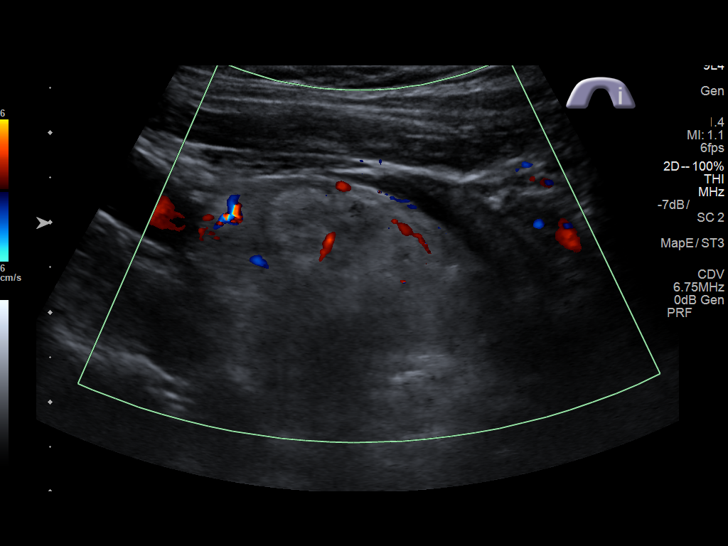
[im 49/91]
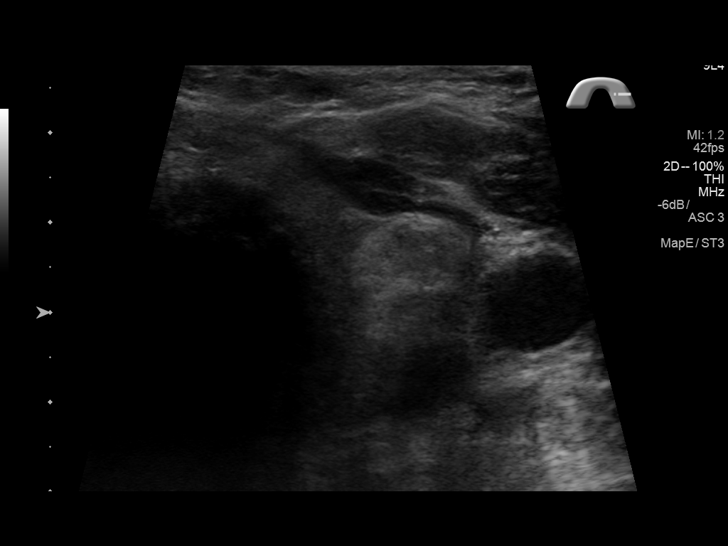
[im 57/91]
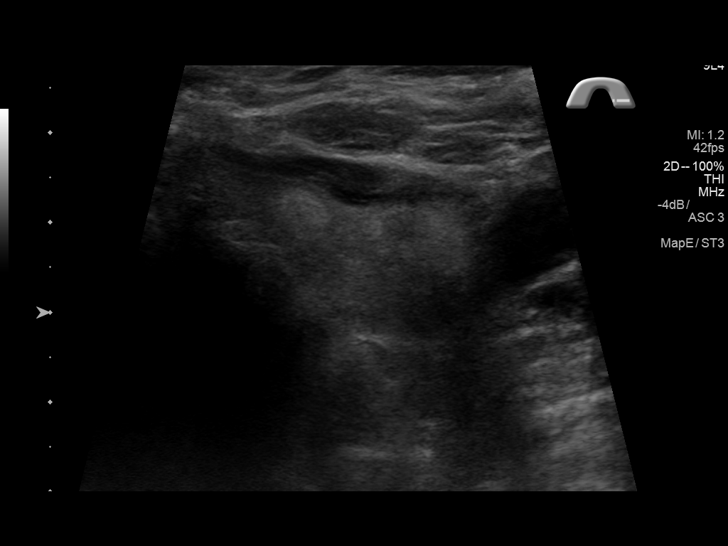
[im 64/91]
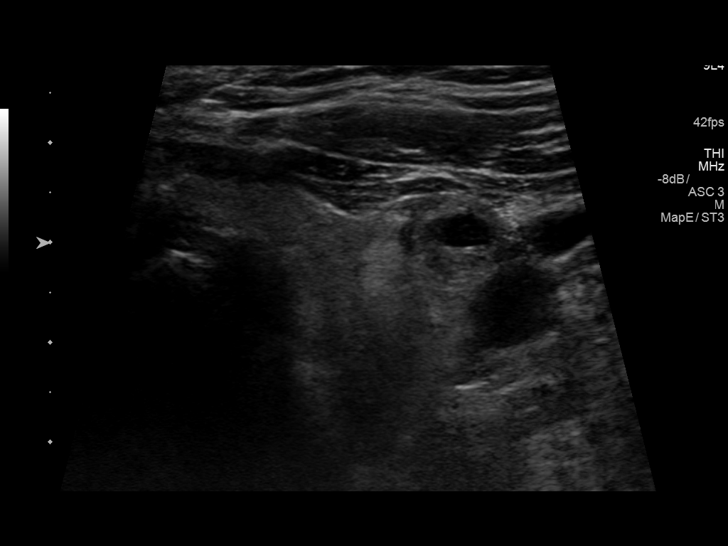
[im 72/91]
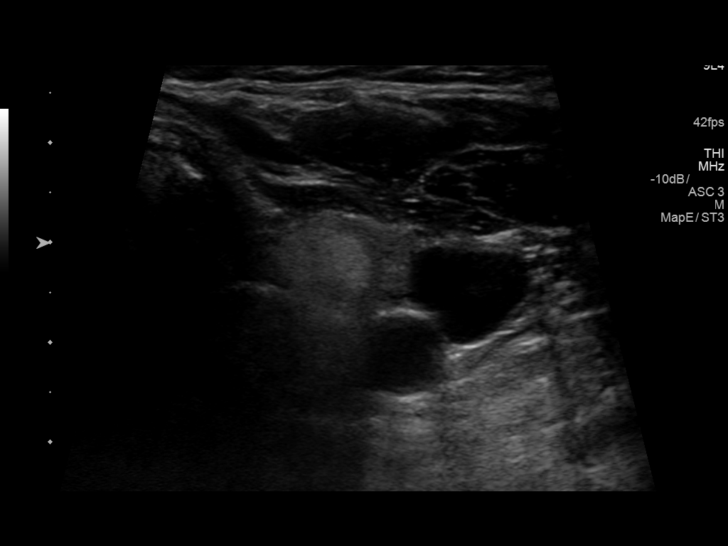
[im 79/91]
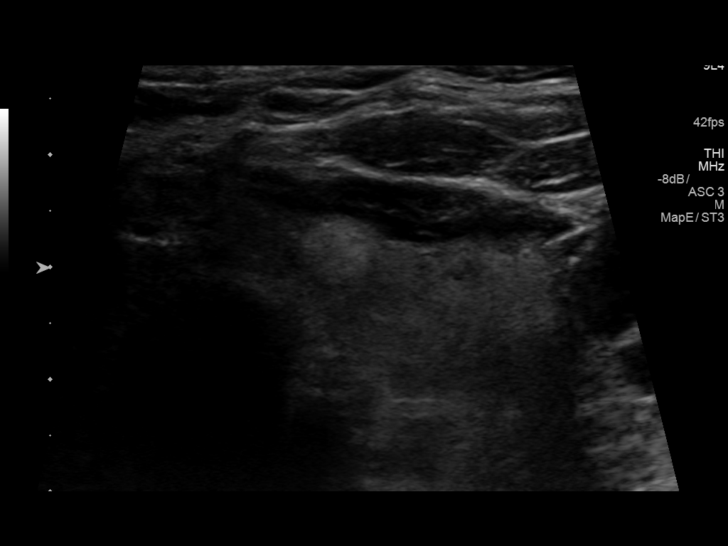
[im 87/91]
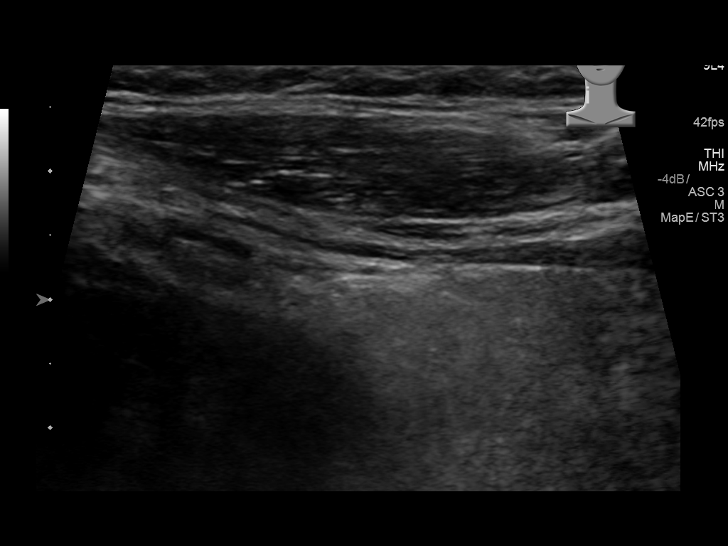

[12 of 25 positions shown; findings below may reference images not displayed]

FINDINGS: Parenchymal Echotexture: Mildly heterogenous

Estimated total number of nodules >/= 1 cm: 2

Number of spongiform nodules >/=  2 cm not described below (TR1): 0

Number of mixed cystic and solid nodules >/= 1.5 cm not described
below (TR2): 0

_________________________________________________________

Isthmus: 0.5 cm

No discrete nodules are identified within the thyroid isthmus.

_________________________________________________________

Right lobe: 4.2 x 1.9 x 1.5 cm

Nodule # 1:

Location: Right; Mid

Size: 0.8 x 0.7 x 0.6 cm

Composition: solid/almost completely solid (2)

Echogenicity: hyperechoic (1)

Shape: taller-than-wide (3)

Margins: smooth (0)

Echogenic foci: none (0)

ACR TI-RADS total points: 6.

ACR TI-RADS risk category: TR4 (4-6 points).

ACR TI-RADS recommendations:

Given size (<0.9 cm) and appearance, this nodule does NOT meet
TI-RADS criteria for biopsy or dedicated follow-up.

_________________________________________________________

Left lobe: 4.3 x 2.4 x 2.3 cm

Nodule # 1:

Location: Left; Mid

Size: 2.2 x 1.5 x 1.3 cm

Composition: solid/almost completely solid (2)

Echogenicity: hyperechoic (1)

Shape: taller-than-wide (3)

Margins: ill-defined (0)

Echogenic foci: none (0)

ACR TI-RADS total points: 6.

ACR TI-RADS risk category: TR4 (4-6 points).

ACR TI-RADS recommendations:

**Given size (>/= 1.5 cm) and appearance, fine needle aspiration of
this moderately suspicious nodule should be considered based on
TI-RADS criteria.

Nodule # 2:

Location: Left; Superior

Size: 1.2 x 0.9 x 0.9 cm

Composition: solid/almost completely solid (2)

Echogenicity: hyperechoic (1)

Shape: not taller-than-wide (0)

Margins: smooth (0)

Echogenic foci: none (0)

ACR TI-RADS total points: 3.

ACR TI-RADS risk category: TR3 (3 points).

ACR TI-RADS recommendations:

Given size (<1.4 cm) and appearance, this nodule does NOT meet
TI-RADS criteria for biopsy or dedicated follow-up.

Nodule # 3:

Location: Left; Inferior

Size: 0.8 x 0.6 x 0.7 cm

Composition: solid/almost completely solid (2)

Echogenicity: hyperechoic (1)

Shape: not taller-than-wide (0)

Margins: smooth (0)

Echogenic foci: none (0)

ACR TI-RADS total points: 3.

ACR TI-RADS risk category: TR3 (3 points).

ACR TI-RADS recommendations:

Given size (<1.4 cm) and appearance, this nodule does NOT meet
TI-RADS criteria for biopsy or dedicated follow-up.
IMPRESSION: The 2.2 cm solid, mid left lobe, hyperechoic nodule meets TI-RADS
criteria for fine needle aspirate biopsy. Other small solid
hyperechoic nodules bilaterally as outlined above do not meet
criteria for biopsy or further imaging follow-up.

The above is in keeping with the ACR TI-RADS recommendations - [HOSPITAL] 4109;[DATE].

## 2017-09-16 DIAGNOSIS — N183 Chronic kidney disease, stage 3 (moderate): Secondary | ICD-10-CM | POA: Diagnosis not present

## 2017-09-16 DIAGNOSIS — I5042 Chronic combined systolic (congestive) and diastolic (congestive) heart failure: Secondary | ICD-10-CM | POA: Diagnosis not present

## 2017-09-16 DIAGNOSIS — I481 Persistent atrial fibrillation: Secondary | ICD-10-CM | POA: Diagnosis not present

## 2017-09-16 DIAGNOSIS — J9601 Acute respiratory failure with hypoxia: Secondary | ICD-10-CM | POA: Diagnosis not present

## 2017-09-16 DIAGNOSIS — J449 Chronic obstructive pulmonary disease, unspecified: Secondary | ICD-10-CM | POA: Diagnosis not present

## 2017-10-02 DIAGNOSIS — J449 Chronic obstructive pulmonary disease, unspecified: Secondary | ICD-10-CM | POA: Diagnosis not present

## 2017-10-02 DIAGNOSIS — I481 Persistent atrial fibrillation: Secondary | ICD-10-CM | POA: Diagnosis not present

## 2017-10-02 DIAGNOSIS — N183 Chronic kidney disease, stage 3 (moderate): Secondary | ICD-10-CM | POA: Diagnosis not present

## 2017-10-02 DIAGNOSIS — J9601 Acute respiratory failure with hypoxia: Secondary | ICD-10-CM | POA: Diagnosis not present

## 2017-10-02 DIAGNOSIS — I5042 Chronic combined systolic (congestive) and diastolic (congestive) heart failure: Secondary | ICD-10-CM | POA: Diagnosis not present

## 2017-10-09 ENCOUNTER — Ambulatory Visit: Payer: Medicare HMO

## 2017-10-09 ENCOUNTER — Ambulatory Visit: Payer: Medicare HMO | Admitting: Oncology

## 2017-10-09 ENCOUNTER — Other Ambulatory Visit: Payer: Medicare HMO

## 2017-10-13 NOTE — Progress Notes (Signed)
[facility]  [phone] [phone]  ID: Patrick [name] OB: 17-Sep-1931  MR#: [id number]  QQI#:[id number]  Patient Care Team: [place] Pink, MD as PCP - General (Family Medicine) Alisa Graff, FNP as Nurse Practitioner (Family Medicine) Idolina Primer, Areta Haber, PA-C as Physician Assistant (Cardiology) Lloyd Huger, MD as Consulting Physician (Hematology) Lavonia Dana, MD as Consulting Physician (Nephrology) Gabriel Carina Betsey Holiday, MD as Physician Assistant (Endocrinology)  CHIEF COMPLAINT:  Iron deficiency anemia, anemia secondary to chronic renal failure.  INTERVAL HISTORY: Patient returns to clinic today for repeat laboratory work, further evaluation, and consideration of additional IV Feraheme.  His chronic weakness and fatigue is unchanged. He also continues to have chronic shortness of breath and requires oxygen 24 hours a day.  He has no neurologic complaints. He has a fair appetite, but denies any weight loss.  He denies any recent fevers or illnesses.  He denies any chest pain, cough, or hemoptysis. He has no nausea, vomiting, constipation, or diarrhea.  He denies any melena or hematochezia.  He has no urinary complaints.  Patient offers no further specific complaints today.   REVIEW OF SYSTEMS:   Review of Systems  Constitutional: Positive for malaise/fatigue. Negative for fever and weight loss.  Respiratory: Positive for shortness of breath. Negative for cough.   Cardiovascular: Negative.  Negative for chest pain and leg swelling.  Gastrointestinal: Negative.  Negative for abdominal pain, blood in stool, melena and nausea.  Genitourinary: Negative.  Negative for hematuria.  Musculoskeletal: Negative.  Negative for back pain.  Skin: Negative.  Negative for rash.  Neurological: Positive for weakness. Negative for tingling and speech change.  Psychiatric/Behavioral: Negative.  The patient is not nervous/anxious.     As per HPI. Otherwise,  a complete review of systems is negative.  PAST MEDICAL HISTORY: Past Medical History:  Diagnosis Date  . Amputated finger   . Anemia of chronic disease    a. plan of oncology to start Procrit - received during admission 12/2013  . Atypical chest pain    a. 12/2014 Neg CE - in setting of L pleural effusion.  . Bell's palsy   . Chronic combined systolic and diastolic CHF (congestive heart failure) ([place])    a. 11/2012 Echo: EF 60-65%, mod conc LVH, mildly dil LA/RA, mild Ao sclerosis w/o stenosis; b. 11/2014 Echo: EF 40-45%, prob mid-apicalanteroseptal, ant, apical HK, Gr2 DD, mod dil LA, mildly dil RA (technically difficult study); c. echo 11/2015: EF 60-65%, trivial AI, nl RV sys fxan, PASP 59; d. echo 10/17: EF 60-65%, no RWMA, mildly dilated LA, mildly reduced RV sys fxn, PASP 70  . CKD (chronic kidney disease), stage III (HCC)    a. stage III-IV  . Diabetes mellitus without complication ([place])   . GERD (gastroesophageal reflux disease)   . History of gastroesophageal reflux (GERD)   . Hyperlipidemia   . Hypertension   . Permanent atrial fibrillation ([place])    a. Dx 12/2011, Rate-controlled, chronic Xarelto (renal dosing) - CHA2DS2VASc = 5.  . Pleural effusion, left    a. 12/2014 s/p thoracentesis - protein <3, LDH 123, no malignancy.    PAST SURGICAL HISTORY: Past Surgical History:  Procedure Laterality Date  . APPENDECTOMY    . COLONOSCOPY  09/2011  . ESOPHAGOGASTRODUODENOSCOPY    . FINGER SURGERY     right hand    FAMILY HISTORY Family History  Problem Relation Age of Onset  . Heart attack Brother   . Diabetes Mother   .  Diabetes Sister   . Hypertension Unknown        ADVANCED DIRECTIVES:    HEALTH MAINTENANCE: Social History   Tobacco Use  . Smoking status: Former Smoker    Packs/day: 0.25    Years: 10.00    Pack years: 2.50    Types: Cigars    Last attempt to quit: 05/06/1966    Years since quitting: 51.4  . Smokeless tobacco: Never Used  Substance Use  Topics  . Alcohol use: No    Alcohol/week: 0.0 oz  . Drug use: No    Allergies  Allergen Reactions  . Morphine     Other reaction(s): Other (See Comments) Out of it while in the hospital    Current Outpatient Medications  Medication Sig Dispense Refill  . cloNIDine (CATAPRES) 0.2 MG tablet Take 1 tablet (0.2 mg total) by mouth 2 (two) times daily. 180 tablet 0  . glucose blood (ACCU-CHEK SMARTVIEW) test strip Use one strip  To check  Glucose twice a day 100 each 6  . insulin NPH-regular Human (NOVOLIN 70/30) (70-30) 100 UNIT/ML injection Inject 35 Units into the skin 2 (two) times daily with a meal. (Patient taking differently: Inject 35 Units into the skin 2 (two) times daily with a meal. ) 10 mL 11  . losartan (COZAAR) 50 MG tablet Take 1 tablet (50 mg total) by mouth daily. 30 tablet 2  . omeprazole (PRILOSEC) 40 MG capsule Take 1 capsule (40 mg total) by mouth daily. 90 capsule 0  . OXYGEN Inhale 2-4 L into the lungs continuous.    . potassium chloride (K-DUR,KLOR-CON) 10 MEQ tablet     . Rivaroxaban (XARELTO) 15 MG TABS tablet Take 1 tablet (15 mg total) by mouth daily with supper. 30 tablet 11  . sertraline (ZOLOFT) 25 MG tablet Take 50 mg by mouth daily.    Marland Kitchen torsemide (DEMADEX) 20 MG tablet Take 2 tablets (40 mg total) by mouth 2 (two) times daily as needed. 360 tablet 3  . morphine (ROXANOL) 20 MG/ML concentrated solution Take 0.25 mLs (5 mg total) by mouth every 4 (four) hours as needed for severe pain or shortness of breath. (Patient not taking: Reported on 10/15/2017) 30 mL 0  . senna-docusate (SENOKOT-S) 8.6-50 MG tablet Take 2 tablets by mouth daily.     No current facility-administered medications for this visit.    Facility-Administered Medications Ordered in Other Visits  Medication Dose Route Frequency Provider Last Rate Last Dose  . epoetin alfa (EPOGEN,PROCRIT) injection 40,000 Units  40,000 Units Subcutaneous Once Lloyd Huger, MD         OBJECTIVE: Vitals:   10/15/17 1439  BP: (!) 148/76  Pulse: 83  Resp: 18  Temp: (!) 96.9 F (36.1 C)     Body mass index is 29.73 kg/m.    ECOG FS:2 - Symptomatic, <50% confined to bed  General: Well-developed, well-nourished, no acute distress. Eyes: Pink conjunctiva, anicteric sclera. HEENT: Normocephalic, moist mucous membranes. Lungs: Clear to auscultation bilaterally. Heart: Regular rate and rhythm. No rubs, murmurs, or gallops. Abdomen: Soft, nontender, nondistended. No organomegaly noted, normoactive bowel sounds. Musculoskeletal: No edema, cyanosis, or clubbing. Neuro: Alert, answering all questions appropriately. Cranial nerves grossly intact. Skin: No rashes or petechiae noted. Psych: Normal affect.  LAB RESULTS:  Lab Results  Component Value Date   NA 142 08/21/2016   K 3.7 08/21/2016   CL 103 08/21/2016   CO2 31 08/21/2016   GLUCOSE 195 (H) 08/21/2016   BUN 25 (  H) 08/21/2016   CREATININE 1.76 (H) 08/21/2016   CALCIUM 9.1 08/21/2016   PROT 7.3 12/20/2015   ALBUMIN 3.3 (L) 12/20/2015   AST 26 12/20/2015   ALT 15 (L) 12/20/2015   ALKPHOS 87 12/20/2015   BILITOT 0.9 12/20/2015   GFRNONAA 34 (L) 08/21/2016   GFRAA 39 (L) 08/21/2016    Lab Results  Component Value Date   WBC 6.9 10/15/2017   NEUTROABS 5.3 10/15/2017   HGB 11.5 (L) 10/15/2017   HCT 34.4 (L) 10/15/2017   MCV 85.7 10/15/2017   PLT 255 10/15/2017   Lab Results  Component Value Date   IRON 35 (L) 10/15/2017   TIBC 346 10/15/2017   IRONPCTSAT 10 (L) 10/15/2017    Lab Results  Component Value Date   FERRITIN 44 10/15/2017     STUDIES: No results found.  ASSESSMENT: Iron deficiency anemia, anemia secondary to chronic renal failure.  PLAN:    1. Iron deficiency anemia, anemia secondary to chronic renal failure: Patient's hemoglobin has trended down slightly to 11.5 and his iron stores remain significantly decreased.  We will proceed with one infusion of 510 mg IV Feraheme  today.  Patient has not required Procrit since September 23, 2016.  Return to clinic in 6 months with repeat laboratory work and further evaluation.  2. Lingula mass/mediastinal lymphadenopathy: Despite concern for underlying malignancy, possibly lymphoma, patient previously elected not to pursue biopsy and continue simple observation. Peripheral blood flow cytometry is negative.   3. Chronic renal insufficiency: Chronic and unchanged.  Continue monitoring and treatment per nephrology. 4. CHF: Continue evaluation and treatment per cardiology. 5. Leg numbness: Patient does not complain of this today.  I spent a total of 30 minutes face-to-face with the patient of which greater than 50% of the visit was spent in counseling and coordination of care as detailed above.  Patient expressed understanding and was in agreement with this plan. He also understands that He can call clinic at any time with any questions, concerns, or complaints.    Lloyd Huger, MD   10/17/2017 3:02 PM

## 2017-10-15 ENCOUNTER — Other Ambulatory Visit: Payer: Self-pay

## 2017-10-15 ENCOUNTER — Inpatient Hospital Stay: Payer: Medicare HMO | Attending: Oncology

## 2017-10-15 ENCOUNTER — Inpatient Hospital Stay (HOSPITAL_BASED_OUTPATIENT_CLINIC_OR_DEPARTMENT_OTHER): Payer: Medicare HMO | Admitting: Oncology

## 2017-10-15 ENCOUNTER — Inpatient Hospital Stay: Payer: Medicare HMO

## 2017-10-15 VITALS — BP 148/76 | HR 83 | Temp 96.9°F | Resp 18 | Wt 195.5 lb

## 2017-10-15 DIAGNOSIS — N183 Chronic kidney disease, stage 3 unspecified: Secondary | ICD-10-CM

## 2017-10-15 DIAGNOSIS — D631 Anemia in chronic kidney disease: Secondary | ICD-10-CM | POA: Diagnosis not present

## 2017-10-15 DIAGNOSIS — I5042 Chronic combined systolic (congestive) and diastolic (congestive) heart failure: Secondary | ICD-10-CM

## 2017-10-15 DIAGNOSIS — I130 Hypertensive heart and chronic kidney disease with heart failure and stage 1 through stage 4 chronic kidney disease, or unspecified chronic kidney disease: Secondary | ICD-10-CM

## 2017-10-15 DIAGNOSIS — D509 Iron deficiency anemia, unspecified: Secondary | ICD-10-CM

## 2017-10-15 DIAGNOSIS — E1122 Type 2 diabetes mellitus with diabetic chronic kidney disease: Secondary | ICD-10-CM | POA: Insufficient documentation

## 2017-10-15 DIAGNOSIS — D638 Anemia in other chronic diseases classified elsewhere: Secondary | ICD-10-CM

## 2017-10-15 LAB — CBC WITH DIFFERENTIAL/PLATELET
BASOS ABS: 0.1 10*3/uL (ref 0–0.1)
BASOS PCT: 1 %
EOS ABS: 0.2 10*3/uL (ref 0–0.7)
EOS PCT: 3 %
HCT: 34.4 % — ABNORMAL LOW (ref 40.0–52.0)
HEMOGLOBIN: 11.5 g/dL — AB (ref 13.0–18.0)
Lymphocytes Relative: 13 %
Lymphs Abs: 0.9 10*3/uL — ABNORMAL LOW (ref 1.0–3.6)
MCH: 28.6 pg (ref 26.0–34.0)
MCHC: 33.4 g/dL (ref 32.0–36.0)
MCV: 85.7 fL (ref 80.0–100.0)
Monocytes Absolute: 0.5 10*3/uL (ref 0.2–1.0)
Monocytes Relative: 7 %
NEUTROS PCT: 76 %
Neutro Abs: 5.3 10*3/uL (ref 1.4–6.5)
PLATELETS: 255 10*3/uL (ref 150–440)
RBC: 4.02 MIL/uL — AB (ref 4.40–5.90)
RDW: 16.3 % — ABNORMAL HIGH (ref 11.5–14.5)
WBC: 6.9 10*3/uL (ref 3.8–10.6)

## 2017-10-15 LAB — IRON AND TIBC
IRON: 35 ug/dL — AB (ref 45–182)
SATURATION RATIOS: 10 % — AB (ref 17.9–39.5)
TIBC: 346 ug/dL (ref 250–450)
UIBC: 311 ug/dL

## 2017-10-15 LAB — FERRITIN: Ferritin: 44 ng/mL (ref 24–336)

## 2017-10-15 MED ORDER — SODIUM CHLORIDE 0.9 % IV SOLN
510.0000 mg | Freq: Once | INTRAVENOUS | Status: AC
  Administered 2017-10-15: 510 mg via INTRAVENOUS
  Filled 2017-10-15: qty 17

## 2017-10-15 MED ORDER — SODIUM CHLORIDE 0.9 % IV SOLN
Freq: Once | INTRAVENOUS | Status: AC
  Administered 2017-10-15: 15:00:00 via INTRAVENOUS
  Filled 2017-10-15: qty 1000

## 2017-10-15 NOTE — Progress Notes (Signed)
Here for follow up. Hospice released pt Feb/ 2019   Per pt" im ok"

## 2017-10-16 DIAGNOSIS — N183 Chronic kidney disease, stage 3 (moderate): Secondary | ICD-10-CM | POA: Diagnosis not present

## 2017-10-16 DIAGNOSIS — J449 Chronic obstructive pulmonary disease, unspecified: Secondary | ICD-10-CM | POA: Diagnosis not present

## 2017-10-16 DIAGNOSIS — I481 Persistent atrial fibrillation: Secondary | ICD-10-CM | POA: Diagnosis not present

## 2017-10-16 DIAGNOSIS — I5042 Chronic combined systolic (congestive) and diastolic (congestive) heart failure: Secondary | ICD-10-CM | POA: Diagnosis not present

## 2017-10-16 DIAGNOSIS — J9601 Acute respiratory failure with hypoxia: Secondary | ICD-10-CM | POA: Diagnosis not present

## 2017-10-30 DIAGNOSIS — H25813 Combined forms of age-related cataract, bilateral: Secondary | ICD-10-CM | POA: Diagnosis not present

## 2017-10-30 DIAGNOSIS — I10 Essential (primary) hypertension: Secondary | ICD-10-CM | POA: Diagnosis not present

## 2017-10-30 DIAGNOSIS — E119 Type 2 diabetes mellitus without complications: Secondary | ICD-10-CM | POA: Diagnosis not present

## 2017-10-30 DIAGNOSIS — H35033 Hypertensive retinopathy, bilateral: Secondary | ICD-10-CM | POA: Diagnosis not present

## 2017-11-02 DIAGNOSIS — I481 Persistent atrial fibrillation: Secondary | ICD-10-CM | POA: Diagnosis not present

## 2017-11-02 DIAGNOSIS — J9601 Acute respiratory failure with hypoxia: Secondary | ICD-10-CM | POA: Diagnosis not present

## 2017-11-02 DIAGNOSIS — N183 Chronic kidney disease, stage 3 (moderate): Secondary | ICD-10-CM | POA: Diagnosis not present

## 2017-11-02 DIAGNOSIS — J449 Chronic obstructive pulmonary disease, unspecified: Secondary | ICD-10-CM | POA: Diagnosis not present

## 2017-11-02 DIAGNOSIS — I5042 Chronic combined systolic (congestive) and diastolic (congestive) heart failure: Secondary | ICD-10-CM | POA: Diagnosis not present

## 2017-11-06 ENCOUNTER — Other Ambulatory Visit: Payer: Self-pay | Admitting: Cardiovascular Disease

## 2017-11-07 DIAGNOSIS — H25811 Combined forms of age-related cataract, right eye: Secondary | ICD-10-CM | POA: Diagnosis not present

## 2017-11-07 DIAGNOSIS — H25813 Combined forms of age-related cataract, bilateral: Secondary | ICD-10-CM | POA: Diagnosis not present

## 2017-11-07 DIAGNOSIS — H25812 Combined forms of age-related cataract, left eye: Secondary | ICD-10-CM | POA: Diagnosis not present

## 2017-11-07 DIAGNOSIS — E119 Type 2 diabetes mellitus without complications: Secondary | ICD-10-CM | POA: Diagnosis not present

## 2017-11-10 ENCOUNTER — Telehealth: Payer: Self-pay | Admitting: Cardiovascular Disease

## 2017-11-10 NOTE — Telephone Encounter (Signed)
Patient due to see Dr [name] in September. Called and s/w patient and wife. Patient denies any new chest pain or shortness of breath since he last saw Dr [name] on 05/2017. Wife states patient is nervous about having cataract surgery. Scheduled patient to see Dr [name] on 12/11/17. They will call the eye surgeon to push his surgery out to a farther date to give time for patient to see Dr [name] and receive clearance.  Wife was very [employer] attorney.  Routing to Dr Donivan Scull nurse.

## 2017-11-10 NOTE — Telephone Encounter (Signed)
° °  [facility] Pre-operative Risk Assessment    Request for surgical clearance:  1. What type of surgery is being performed? Cataract extraction   2. When is this surgery scheduled? 12-12-17    3. What type of clearance is required (medical clearance vs. Pharmacy clearance to hold med vs. Both)? Medical   Are there any medications that need to be held prior to surgery and how long?none 4. Practice name and name of physician performing surgery?[facility] Dr. Alanda Slim   5. What is your office phone number [phone] ext [phone]    7.   What is your office fax number  [phone] 8.   Anesthesia type (None, local, MAC, general) ?   IV Sedation   Clarisse Gouge 11/10/2017, 9:16 AM  _________________________________________________________________   (provider comments below)

## 2017-11-11 NOTE — Telephone Encounter (Signed)
acceptable risk for eye surgery Often they don't need to hold xarelto Can we find out? If they need to hold anticoagulation, would be ok, 2 days before

## 2017-11-12 NOTE — Telephone Encounter (Signed)
Copy of phone faxed to [facility] at [phone]. Confirmation received.

## 2017-11-12 NOTE — Telephone Encounter (Signed)
Reviewed paper fax regarding the patient's surgical clearance.  Per paper form, they do NOT need to stop blood thinners for this surgery.   Will fax Dr. Donivan Scull statement of clearance to [facility].

## 2017-11-16 DIAGNOSIS — N183 Chronic kidney disease, stage 3 (moderate): Secondary | ICD-10-CM | POA: Diagnosis not present

## 2017-11-16 DIAGNOSIS — J9601 Acute respiratory failure with hypoxia: Secondary | ICD-10-CM | POA: Diagnosis not present

## 2017-11-16 DIAGNOSIS — I5042 Chronic combined systolic (congestive) and diastolic (congestive) heart failure: Secondary | ICD-10-CM | POA: Diagnosis not present

## 2017-11-16 DIAGNOSIS — J449 Chronic obstructive pulmonary disease, unspecified: Secondary | ICD-10-CM | POA: Diagnosis not present

## 2017-11-16 DIAGNOSIS — I481 Persistent atrial fibrillation: Secondary | ICD-10-CM | POA: Diagnosis not present

## 2017-12-03 DIAGNOSIS — J449 Chronic obstructive pulmonary disease, unspecified: Secondary | ICD-10-CM | POA: Diagnosis not present

## 2017-12-03 DIAGNOSIS — I481 Persistent atrial fibrillation: Secondary | ICD-10-CM | POA: Diagnosis not present

## 2017-12-03 DIAGNOSIS — N183 Chronic kidney disease, stage 3 (moderate): Secondary | ICD-10-CM | POA: Diagnosis not present

## 2017-12-03 DIAGNOSIS — I5042 Chronic combined systolic (congestive) and diastolic (congestive) heart failure: Secondary | ICD-10-CM | POA: Diagnosis not present

## 2017-12-03 DIAGNOSIS — J9601 Acute respiratory failure with hypoxia: Secondary | ICD-10-CM | POA: Diagnosis not present

## 2017-12-10 NOTE — Progress Notes (Deleted)
Cardiology Office Note  Date:  12/10/2017   ID:  Patrick [name], DOB July 18, 1931, MRN [id number]  PCP:  [place] Pink, MD   No chief complaint on file.   HPI:  Mr. Patrick [name] is a very pleasant 84 year old gentleman with history of  hypertension,  COPD,  emphysema diabetes,  hyperlipidemia,  GERD  Chronic atrial fibrillation Acute on chronic diastolic CHF, on torsemide  presented to the hospital December 30 2011  in atrial fibrillation with RVR,  started on rate controlling medications and discharged on Cardizem 300 mg daily with anticoagulation (xarelto).   lower extremity edema with Cardizem  Previous diagnosis of Bell's palsy  Hospital admission 01/03/2014 with shortness of breath, diastolic CHF, pleural effusions, anemia, responding well to diuresis  He presents for routine followup of his atrial fibrillation  In follow-up today, he presents in a wheelchair Wife reports he is walking several times a day at home with a walker No falls, feels stronger On his last clinic visit was unable to stand without assistance No longer under the care of hospice, was too stable  Denies any shortness of breath, tachycardia, Minimal ankle swelling Takes torsemide daily, sometimes twice a day for any weight gain above 190 pounds Recent lab work reviewed, stable   Tolerating Xarelto 15 daily  Previous hospital admission April 2018 with anemia, had blood  transfusion and iron infusion  EKG personally reviewed by myself on todays visit Shows atrial fibrillation with ventricular rate 94 bpm unable to exclude old inferior MI   other past medical history reviewed Admission to the hospital 06/30/2016 for sepsis, pneumonia Creatinine 2.0  also with anemia,  Received one unit packed cells  In the hospital  Iron level low.     admitted in September 2017 with acute on chronic diastolic CHF.  Echo at that time showed normal EF with PASP 59 mmHg.  He was diuresed and sent home on  torsemide every other day.   On 01/22/16 he developed sudden onset of SOB and presented to [facility] where he was found to have acute on chronic respiratory failure felt to be multifactorial including some component of CHF exacerbation and worsening anemia with a 3 gram drop from 11.3 during prior admission to 8.6   echo showed normal LV systolic function with an EF of 60-65%, nmo RWMA, mildly dilated left atrium, mildly increased RV wall thickness with mild reduction in systolic function, PASP increased at 70 mmHg.  CT chest showed underlying emphysema, pulmonary nodule measuring 1.8 x1.1 cm extensive atherosclerotic calcification, ascending aorta measuring 4.1x37 cm  Seen by CHF clinic on 01/12/2014. Note was reviewed. No new medication changes at that time  presented to [facility] on 10/12 with increased DOE. pBNP was found to be 2708. CXR showed mild CHF with bilateral pleural effusions. He was diuresed with IV Lasix 40 mg BID with good response. He does have a narrow euvolemic window 2/2 his CKD. He was restarted back on his home dose of torsemide 20 mg bid with double dose prn weight gain. H/H has been drifting down over the past year, has been seen by Oncology in the past with a plan for Procrit once hgb <10. Procrit and iron were given this past admission.    Previously not interested in cardioversion  He has been tolerating anticoagulation at renal dosing.  CT scan of the head in the hospital showed no intracranial process Lab work October 2013, creatinine 2.2, BUN 34 Lab work August 2013 , Hemoglobin A1c 8.1 Total cholesterol  97, LDL 48 creatinine 2.2, BUN in the 40s which is higher than his baseline BUN in the 20s. Wife reports he does not drink very much  Echocardiogram showed ejection fraction greater than 55%, mildly dilated left atrium, otherwise normal study MRI of the brain showed chronic changes without evidence of acute abnormalities   PMH:   has a past medical history of  Amputated finger, Anemia of chronic disease, Atypical chest pain, Bell's palsy, Chronic combined systolic and diastolic CHF (congestive heart failure) ([place]), CKD (chronic kidney disease), stage III ([place]), Diabetes mellitus without complication ([place]), GERD (gastroesophageal reflux disease), History of gastroesophageal reflux (GERD), Hyperlipidemia, Hypertension, Permanent atrial fibrillation ([place]), and Pleural effusion, left.  PSH:    Past Surgical History:  Procedure Laterality Date  . APPENDECTOMY    . COLONOSCOPY  09/2011  . ESOPHAGOGASTRODUODENOSCOPY    . FINGER SURGERY     right hand    Current Outpatient Medications  Medication Sig Dispense Refill  . cloNIDine (CATAPRES) 0.2 MG tablet Take 1 tablet (0.2 mg total) by mouth 2 (two) times daily. 180 tablet 0  . glucose blood (ACCU-CHEK SMARTVIEW) test strip Use one strip  To check  Glucose twice a day 100 each 6  . insulin NPH-regular Human (NOVOLIN 70/30) (70-30) 100 UNIT/ML injection Inject 35 Units into the skin 2 (two) times daily with a meal. (Patient taking differently: Inject 35 Units into the skin 2 (two) times daily with a meal. ) 10 mL 11  . losartan (COZAAR) 50 MG tablet Take 1 tablet (50 mg total) by mouth daily. 30 tablet 2  . morphine (ROXANOL) 20 MG/ML concentrated solution Take 0.25 mLs (5 mg total) by mouth every 4 (four) hours as needed for severe pain or shortness of breath. (Patient not taking: Reported on 10/15/2017) 30 mL 0  . omeprazole (PRILOSEC) 40 MG capsule Take 1 capsule (40 mg total) by mouth daily. 90 capsule 0  . OXYGEN Inhale 2-4 L into the lungs continuous.    . potassium chloride (K-DUR,KLOR-CON) 10 MEQ tablet TAKE 1 TABLET TWICE DAILY 180 tablet 1  . Rivaroxaban (XARELTO) 15 MG TABS tablet Take 1 tablet (15 mg total) by mouth daily with supper. 30 tablet 11  . senna-docusate (SENOKOT-S) 8.6-50 MG tablet Take 2 tablets by mouth daily.    . sertraline (ZOLOFT) 25 MG tablet Take 50 mg by mouth daily.    Marland Kitchen  torsemide (DEMADEX) 20 MG tablet Take 2 tablets (40 mg total) by mouth 2 (two) times daily as needed. 360 tablet 3   No current facility-administered medications for this visit.    Facility-Administered Medications Ordered in Other Visits  Medication Dose Route Frequency Provider Last Rate Last Dose  . epoetin alfa (EPOGEN,PROCRIT) injection 40,000 Units  40,000 Units Subcutaneous Once Lloyd Huger, MD         Allergies:   Morphine   Social History:  The patient  reports that he quit smoking about 51 years ago. His smoking use included cigars. He has a 2.50 pack-year smoking history. He has never used smokeless tobacco. He reports that he does not drink alcohol or use drugs.   Family History:   family history includes Diabetes in his mother and sister; Heart attack in his brother; Hypertension in his unknown relative.    Review of Systems: Review of Systems  Constitutional: Negative.        Weight gain  Respiratory: Negative.   Cardiovascular: Negative.   Gastrointestinal: Negative.   Musculoskeletal: Negative.  Leg weakness, fall risk  Psychiatric/Behavioral: Negative.   All other systems reviewed and are negative.    PHYSICAL EXAM: VS:  There were no vitals taken for this visit. , BMI There is no height or weight on file to calculate BMI. Constitutional:  oriented to person, place, and time. No distress. Presenting in a wheelchair HENT:  Head: Normocephalic and atraumatic.  Eyes:  no discharge. No scleral icterus.  Neck: Normal range of motion. Neck supple. No JVD present.  Cardiovascular: Irregularly irregular,  normal heart sounds and intact distal pulses. Exam reveals no gallop and no friction rub. No edema No murmur heard. Pulmonary/Chest: Effort normal and breath sounds normal. No stridor. No respiratory distress.  no wheezes.  no rales.  no tenderness.  Abdominal: Soft.  no distension.  no tenderness.  Musculoskeletal: Normal range of motion.  no   tenderness or deformity.  Neurological:  normal muscle tone. Coordination normal. No atrophy Skin: Skin is warm and dry. No rash noted. not diaphoretic.  Psychiatric:  normal mood and affect. behavior is normal. Thought content normal.   Recent Labs: 10/15/2017: Hemoglobin 11.5; Platelets 255    Lipid Panel Lab Results  Component Value Date   CHOL 81 01/03/2014   HDL 16 (L) 01/03/2014   LDLCALC 43 01/03/2014   TRIG 111 01/03/2014      Wt Readings from Last 3 Encounters:  10/15/17 195 lb 8 oz (88.7 kg)  07/07/17 196 lb 6 oz (89.1 kg)  06/05/17 191 lb (86.6 kg)       ASSESSMENT AND PLAN:  Persistent atrial fibrillation ([place]) - Plan: EKG 12-Lead Rate well controlled, Xarelto 15 mg daily, no recent fall  No changes to his medications  Chronic diastolic heart failure (HCC) Weight stable, tolerating torsemide 20 mg daily, extra torsemide as needed for weight gain Does extra for weight gain, weight 190 No changes made  Essential hypertension Blood pressure is well controlled on today's visit. No changes made to the medications. Waxes and wanes  Mixed hyperlipidemia Encouraged him to continue on pravastatin  Currently at goal  Type 2 diabetes mellitus with other circulatory complication, with long-term current use of insulin (HCC) Walking and exercising more, hemoglobin A1c at goal Much improved  Anemia of chronic disease Blood count stable, hematocrit 37  CKD (chronic kidney disease), stage III Stable renal function, followed by nephrology most recent creatinine 1.7  Lymphadenopathy followed by oncology CLL  Stable per the wife    Total encounter time more than 25 minutes  Greater than 50% was spent in counseling and coordination of care with the patient   Disposition:   F/U  6 months   No orders of the defined types were placed in this encounter.    Signed, Esmond Plants, M.D., Ph.D. 12/10/2017  [place],  [place]

## 2017-12-11 ENCOUNTER — Ambulatory Visit: Payer: Medicare HMO | Admitting: Cardiovascular Disease

## 2017-12-11 NOTE — Progress Notes (Signed)
Cardiology Office Note  Date:  12/12/2017   ID:  Patrick [name], DOB 12-11-1931, MRN [id number]  PCP:  Patrick Pink, MD   Chief Complaint  Patient presents with  . other    6 month f/u pt doesn't want to have cataract surgery. Meds reviewed verbally with pt.    HPI:  Patrick [name] is a very pleasant 84 year old gentleman with history of  hypertension,  COPD,  emphysema diabetes,  hyperlipidemia,  GERD  Chronic atrial fibrillation Acute on chronic diastolic CHF, on torsemide  presented to the hospital December 30 2011  in atrial fibrillation with RVR,  started on rate controlling medications and discharged on Cardizem 300 mg daily with anticoagulation (xarelto).   lower extremity edema with Cardizem  Previous diagnosis of Bell's palsy  Hospital admission 01/03/2014 with shortness of breath, diastolic CHF, pleural effusions, anemia, responding well to diuresis  He presents for routine followup of his atrial fibrillation  In follow-up today, he presents in a wheelchair No recent trips to the emergency room, no hospitalizations Denies any lower extremity edema Blood pressure running high at home typically 628 systolic Heart rate well controlled Denies significant shortness of breath Takes torsemide daily, sometimes twice a day for any weight gain   Tolerating Xarelto 15 daily Very expensive  Previous hospital admission April 2018 with anemia, had blood  transfusion and iron infusion  EKG personally reviewed by myself on todays visit Shows atrial fibrillation with ventricular rate 70 bpm unable to exclude old inferior MI   other past medical history reviewed Admission to the hospital 06/30/2016 for sepsis, pneumonia Creatinine 2.0  also with anemia,  Received one unit packed cells  In the hospital  Iron level low.     admitted in September 2017 with acute on chronic diastolic CHF.  Echo at that time showed normal EF with PASP 59 mmHg.  He was diuresed and sent  home on torsemide every other day.   On 01/22/16 he developed sudden onset of SOB and presented to [facility] where he was found to have acute on chronic respiratory failure felt to be multifactorial including some component of CHF exacerbation and worsening anemia with a 3 gram drop from 11.3 during prior admission to 8.6   echo showed normal LV systolic function with an EF of 60-65%, nmo RWMA, mildly dilated left atrium, mildly increased RV wall thickness with mild reduction in systolic function, PASP increased at 70 mmHg.  CT chest showed underlying emphysema, pulmonary nodule measuring 1.8 x1.1 cm extensive atherosclerotic calcification, ascending aorta measuring 4.1x37 cm  Seen by CHF clinic on 01/12/2014. Note was reviewed. No new medication changes at that time  presented to [facility] on 10/12 with increased DOE. pBNP was found to be 2708. CXR showed mild CHF with bilateral pleural effusions. He was diuresed with IV Lasix 40 mg BID with good response. He does have a narrow euvolemic window 2/2 his CKD. He was restarted back on his home dose of torsemide 20 mg bid with double dose prn weight gain. H/H has been drifting down over the past year, has been seen by Oncology in the past with a plan for Procrit once hgb <10. Procrit and iron were given this past admission.    Previously not interested in cardioversion  He has been tolerating anticoagulation at renal dosing.  CT scan of the head in the hospital showed no intracranial process Lab work October 2013, creatinine 2.2, BUN 34 Lab work August 2013 , Hemoglobin A1c 8.1 Total cholesterol  97, LDL 48 creatinine 2.2, BUN in the 40s which is higher than his baseline BUN in the 20s. Wife reports he does not drink very much  Echocardiogram showed ejection fraction greater than 55%, mildly dilated left atrium, otherwise normal study MRI of the brain showed chronic changes without evidence of acute abnormalities   PMH:   has a past medical history  of Amputated finger, Anemia of chronic disease, Atypical chest pain, Bell's palsy, Chronic combined systolic and diastolic CHF (congestive heart failure) ([place]), CKD (chronic kidney disease), stage III ([place]), Diabetes mellitus without complication ([place]), GERD (gastroesophageal reflux disease), History of gastroesophageal reflux (GERD), Hyperlipidemia, Hypertension, Permanent atrial fibrillation ([place]), and Pleural effusion, left.  PSH:    Past Surgical History:  Procedure Laterality Date  . APPENDECTOMY    . COLONOSCOPY  09/2011  . ESOPHAGOGASTRODUODENOSCOPY    . FINGER SURGERY     right hand    Current Outpatient Medications  Medication Sig Dispense Refill  . cloNIDine (CATAPRES) 0.2 MG tablet Take 1 tablet (0.2 mg total) by mouth 2 (two) times daily. 180 tablet 0  . glucose blood (ACCU-CHEK SMARTVIEW) test strip Use one strip  To check  Glucose twice a day 100 each 6  . insulin NPH-regular Human (NOVOLIN 70/30) (70-30) 100 UNIT/ML injection Inject 35 Units into the skin 2 (two) times daily with a meal. (Patient taking differently: Inject 50 Units into the skin 2 (two) times daily with a meal. ) 10 mL 11  . losartan (COZAAR) 50 MG tablet Take 1 tablet (50 mg total) by mouth daily. 30 tablet 2  . morphine (ROXANOL) 20 MG/ML concentrated solution Take 0.25 mLs (5 mg total) by mouth every 4 (four) hours as needed for severe pain or shortness of breath. 30 mL 0  . omeprazole (PRILOSEC) 40 MG capsule Take 1 capsule (40 mg total) by mouth daily. 90 capsule 0  . OXYGEN Inhale 2-4 L into the lungs continuous.    . potassium chloride (K-DUR,KLOR-CON) 10 MEQ tablet TAKE 1 TABLET TWICE DAILY 180 tablet 1  . Rivaroxaban (XARELTO) 15 MG TABS tablet Take 1 tablet (15 mg total) by mouth daily with supper. 30 tablet 11  . senna-docusate (SENOKOT-S) 8.6-50 MG tablet Take 2 tablets by mouth daily.    Marland Kitchen torsemide (DEMADEX) 20 MG tablet Take 2 tablets (40 mg total) by mouth 2 (two) times daily as needed. 360  tablet 3   No current facility-administered medications for this visit.    Facility-Administered Medications Ordered in Other Visits  Medication Dose Route Frequency Provider Last Rate Last Dose  . epoetin alfa (EPOGEN,PROCRIT) injection 40,000 Units  40,000 Units Subcutaneous Once Lloyd Huger, MD         Allergies:   Morphine   Social History:  The patient  reports that he quit smoking about 51 years ago. His smoking use included cigars. He has a 2.50 pack-year smoking history. He has never used smokeless tobacco. He reports that he does not drink alcohol or use drugs.   Family History:   family history includes Diabetes in his mother and sister; Heart attack in his brother; Hypertension in his unknown relative.    Review of Systems: Review of Systems  Constitutional: Negative.        Weight gain  Respiratory: Negative.   Cardiovascular: Negative.   Gastrointestinal: Negative.   Musculoskeletal: Negative.        Leg weakness, fall risk  Psychiatric/Behavioral: Negative.   All other systems reviewed and are  negative.    PHYSICAL EXAM: VS:  BP (!) 157/95 (BP Location: Left Arm, Patient Position: Sitting, Cuff Size: Normal)   Pulse 79   Ht 5\' 8"  (1.727 m)   Wt 196 lb 8 oz (89.1 kg)   BMI 29.88 kg/m  , BMI Body mass index is 29.88 kg/m.  No significant change in exam Constitutional:  oriented to person, place, and time. No distress. Presenting in a wheelchair HENT:  Head: Normocephalic and atraumatic.  Eyes:  no discharge. No scleral icterus.  Neck: Normal range of motion. Neck supple. No JVD present.  Cardiovascular: Irregularly irregular,  normal heart sounds and intact distal pulses. Exam reveals no gallop and no friction rub. No edema No murmur heard. Pulmonary/Chest: Effort normal and breath sounds normal. No stridor. No respiratory distress.  no wheezes.  no rales.  no tenderness.  Abdominal: Soft.  no distension.  no tenderness.  Musculoskeletal: Normal  range of motion.  no  tenderness or deformity.  Neurological:  normal muscle tone. Coordination normal. No atrophy Skin: Skin is warm and dry. No rash noted. not diaphoretic.  Psychiatric:  normal mood and affect. behavior is normal. Thought content normal.   Recent Labs: 10/15/2017: Hemoglobin 11.5; Platelets 255    Lipid Panel Lab Results  Component Value Date   CHOL 81 01/03/2014   HDL 16 (L) 01/03/2014   LDLCALC 43 01/03/2014   TRIG 111 01/03/2014      Wt Readings from Last 3 Encounters:  12/12/17 196 lb 8 oz (89.1 kg)  10/15/17 195 lb 8 oz (88.7 kg)  07/07/17 196 lb 6 oz (89.1 kg)     ASSESSMENT AND PLAN:  Persistent atrial fibrillation (HCC) - Plan: EKG 12-Lead Rate well controlled, Xarelto 15 mg daily, no recent fall  No changes to his medications Continue current dose of metoprolol  Chronic diastolic heart failure (HCC) Appears relatively euvolemic Weight stable continue current dose of torsemide  Essential hypertension Blood pressure continues to run high at home, Long discussion concerning various treatment options available We will avoid overdiuresis We will increase clonidine up to 0.3 mg twice daily  Mixed hyperlipidemia  on pravastatin,  goal LDL less than 70  Type 2 diabetes mellitus with other circulatory complication, with long-term current use of insulin (HCC) Recommended continue low carbohydrate diet Walking program  Anemia of chronic disease Blood count stable, hematocrit 37  CKD (chronic kidney disease), stage III Stable renal function, followed by nephrology  Appears euvolemic  Lymphadenopathy followed by oncology CLL     Total encounter time more than 25 minutes  Greater than 50% was spent in counseling and coordination of care with the patient   Disposition:   F/U  12 months   Orders Placed This Encounter  Procedures  . EKG 12-Lead     Signed, Esmond Plants, M.D., Ph.D. 12/12/2017  [place],  [place]

## 2017-12-12 ENCOUNTER — Ambulatory Visit: Payer: Medicare HMO | Admitting: Cardiovascular Disease

## 2017-12-12 ENCOUNTER — Encounter: Payer: Self-pay | Admitting: Cardiovascular Disease

## 2017-12-12 VITALS — BP 157/95 | HR 79 | Ht 68.0 in | Wt 196.5 lb

## 2017-12-12 DIAGNOSIS — N183 Chronic kidney disease, stage 3 unspecified: Secondary | ICD-10-CM

## 2017-12-12 DIAGNOSIS — E782 Mixed hyperlipidemia: Secondary | ICD-10-CM | POA: Diagnosis not present

## 2017-12-12 DIAGNOSIS — I110 Hypertensive heart disease with heart failure: Secondary | ICD-10-CM

## 2017-12-12 DIAGNOSIS — I951 Orthostatic hypotension: Secondary | ICD-10-CM | POA: Diagnosis not present

## 2017-12-12 DIAGNOSIS — Z794 Long term (current) use of insulin: Secondary | ICD-10-CM

## 2017-12-12 DIAGNOSIS — D638 Anemia in other chronic diseases classified elsewhere: Secondary | ICD-10-CM | POA: Diagnosis not present

## 2017-12-12 DIAGNOSIS — I4819 Other persistent atrial fibrillation: Secondary | ICD-10-CM

## 2017-12-12 DIAGNOSIS — I10 Essential (primary) hypertension: Secondary | ICD-10-CM | POA: Diagnosis not present

## 2017-12-12 DIAGNOSIS — E1122 Type 2 diabetes mellitus with diabetic chronic kidney disease: Secondary | ICD-10-CM

## 2017-12-12 DIAGNOSIS — I5032 Chronic diastolic (congestive) heart failure: Secondary | ICD-10-CM

## 2017-12-12 DIAGNOSIS — I481 Persistent atrial fibrillation: Secondary | ICD-10-CM

## 2017-12-12 MED ORDER — CLONIDINE HCL 0.3 MG PO TABS: 0.3000 mg | ORAL_TABLET | Freq: Two times a day (BID) | ORAL | 3 refills | Status: AC

## 2017-12-12 NOTE — Patient Instructions (Signed)
Medication Instructions:   Please increase the clonidine up to 0.3 mg twice a day  Labwork:  No new labs needed  Testing/Procedures:  No further testing at this time   Follow-Up: It was a pleasure seeing you in the office today. Please call us if you have new issues that need to be addressed before your next appt.  [phone]  Your physician wants you to follow-up in: 12 months.  You will receive a reminder letter in the mail two months in advance. If you don't receive a letter, please call our office to schedule the follow-up appointment.  If you need a refill on your cardiac medications before your next appointment, please call your pharmacy.  For educational health videos Log in to : [email or web address] Or : [email or web address], password : [email or web address]

## 2017-12-17 DIAGNOSIS — I5042 Chronic combined systolic (congestive) and diastolic (congestive) heart failure: Secondary | ICD-10-CM | POA: Diagnosis not present

## 2017-12-17 DIAGNOSIS — I481 Persistent atrial fibrillation: Secondary | ICD-10-CM | POA: Diagnosis not present

## 2017-12-17 DIAGNOSIS — J449 Chronic obstructive pulmonary disease, unspecified: Secondary | ICD-10-CM | POA: Diagnosis not present

## 2017-12-17 DIAGNOSIS — N183 Chronic kidney disease, stage 3 (moderate): Secondary | ICD-10-CM | POA: Diagnosis not present

## 2017-12-17 DIAGNOSIS — J9601 Acute respiratory failure with hypoxia: Secondary | ICD-10-CM | POA: Diagnosis not present

## 2018-01-05 ENCOUNTER — Ambulatory Visit: Payer: Medicare HMO | Admitting: Family

## 2018-01-16 DIAGNOSIS — I5042 Chronic combined systolic (congestive) and diastolic (congestive) heart failure: Secondary | ICD-10-CM | POA: Diagnosis not present

## 2018-01-16 DIAGNOSIS — J449 Chronic obstructive pulmonary disease, unspecified: Secondary | ICD-10-CM | POA: Diagnosis not present

## 2018-01-16 DIAGNOSIS — J9601 Acute respiratory failure with hypoxia: Secondary | ICD-10-CM | POA: Diagnosis not present

## 2018-01-16 DIAGNOSIS — N183 Chronic kidney disease, stage 3 (moderate): Secondary | ICD-10-CM | POA: Diagnosis not present

## 2018-01-19 ENCOUNTER — Ambulatory Visit: Payer: Medicare HMO | Admitting: Family

## 2018-01-22 DIAGNOSIS — R809 Proteinuria, unspecified: Secondary | ICD-10-CM | POA: Diagnosis not present

## 2018-01-22 DIAGNOSIS — E1122 Type 2 diabetes mellitus with diabetic chronic kidney disease: Secondary | ICD-10-CM | POA: Diagnosis not present

## 2018-01-22 DIAGNOSIS — I5032 Chronic diastolic (congestive) heart failure: Secondary | ICD-10-CM | POA: Diagnosis not present

## 2018-01-22 DIAGNOSIS — E8809 Other disorders of plasma-protein metabolism, not elsewhere classified: Secondary | ICD-10-CM | POA: Diagnosis not present

## 2018-01-22 DIAGNOSIS — N184 Chronic kidney disease, stage 4 (severe): Secondary | ICD-10-CM | POA: Diagnosis not present

## 2018-01-22 DIAGNOSIS — I10 Essential (primary) hypertension: Secondary | ICD-10-CM | POA: Diagnosis not present

## 2018-01-22 DIAGNOSIS — N2581 Secondary hyperparathyroidism of renal origin: Secondary | ICD-10-CM | POA: Diagnosis not present

## 2018-01-22 DIAGNOSIS — I4891 Unspecified atrial fibrillation: Secondary | ICD-10-CM | POA: Diagnosis not present

## 2018-01-22 DIAGNOSIS — D631 Anemia in chronic kidney disease: Secondary | ICD-10-CM | POA: Diagnosis not present

## 2018-01-26 ENCOUNTER — Ambulatory Visit: Payer: Medicare HMO | Admitting: Family

## 2018-01-26 DIAGNOSIS — N2581 Secondary hyperparathyroidism of renal origin: Secondary | ICD-10-CM | POA: Diagnosis not present

## 2018-01-26 DIAGNOSIS — N183 Chronic kidney disease, stage 3 (moderate): Secondary | ICD-10-CM | POA: Diagnosis not present

## 2018-01-26 DIAGNOSIS — E876 Hypokalemia: Secondary | ICD-10-CM | POA: Diagnosis not present

## 2018-01-26 DIAGNOSIS — E1122 Type 2 diabetes mellitus with diabetic chronic kidney disease: Secondary | ICD-10-CM | POA: Diagnosis not present

## 2018-01-26 DIAGNOSIS — R809 Proteinuria, unspecified: Secondary | ICD-10-CM | POA: Diagnosis not present

## 2018-01-26 DIAGNOSIS — I129 Hypertensive chronic kidney disease with stage 1 through stage 4 chronic kidney disease, or unspecified chronic kidney disease: Secondary | ICD-10-CM | POA: Diagnosis not present

## 2018-02-02 ENCOUNTER — Encounter: Payer: Self-pay | Admitting: Family

## 2018-02-02 ENCOUNTER — Ambulatory Visit: Payer: Medicare HMO | Attending: Family | Admitting: Family

## 2018-02-02 VITALS — BP 147/76 | HR 92 | Resp 18 | Ht 68.0 in | Wt 191.4 lb

## 2018-02-02 DIAGNOSIS — I10 Essential (primary) hypertension: Secondary | ICD-10-CM

## 2018-02-02 DIAGNOSIS — Z7901 Long term (current) use of anticoagulants: Secondary | ICD-10-CM | POA: Diagnosis not present

## 2018-02-02 DIAGNOSIS — K219 Gastro-esophageal reflux disease without esophagitis: Secondary | ICD-10-CM | POA: Insufficient documentation

## 2018-02-02 DIAGNOSIS — Z885 Allergy status to narcotic agent status: Secondary | ICD-10-CM | POA: Diagnosis not present

## 2018-02-02 DIAGNOSIS — Z8249 Family history of ischemic heart disease and other diseases of the circulatory system: Secondary | ICD-10-CM | POA: Diagnosis not present

## 2018-02-02 DIAGNOSIS — N183 Chronic kidney disease, stage 3 unspecified: Secondary | ICD-10-CM

## 2018-02-02 DIAGNOSIS — I130 Hypertensive heart and chronic kidney disease with heart failure and stage 1 through stage 4 chronic kidney disease, or unspecified chronic kidney disease: Secondary | ICD-10-CM | POA: Diagnosis not present

## 2018-02-02 DIAGNOSIS — E785 Hyperlipidemia, unspecified: Secondary | ICD-10-CM | POA: Diagnosis not present

## 2018-02-02 DIAGNOSIS — I4821 Permanent atrial fibrillation: Secondary | ICD-10-CM | POA: Insufficient documentation

## 2018-02-02 DIAGNOSIS — Z79899 Other long term (current) drug therapy: Secondary | ICD-10-CM | POA: Diagnosis not present

## 2018-02-02 DIAGNOSIS — Z87891 Personal history of nicotine dependence: Secondary | ICD-10-CM | POA: Diagnosis not present

## 2018-02-02 DIAGNOSIS — Z9981 Dependence on supplemental oxygen: Secondary | ICD-10-CM | POA: Insufficient documentation

## 2018-02-02 DIAGNOSIS — Z833 Family history of diabetes mellitus: Secondary | ICD-10-CM | POA: Diagnosis not present

## 2018-02-02 DIAGNOSIS — Z794 Long term (current) use of insulin: Secondary | ICD-10-CM | POA: Insufficient documentation

## 2018-02-02 DIAGNOSIS — I5032 Chronic diastolic (congestive) heart failure: Secondary | ICD-10-CM | POA: Insufficient documentation

## 2018-02-02 DIAGNOSIS — E1122 Type 2 diabetes mellitus with diabetic chronic kidney disease: Secondary | ICD-10-CM | POA: Diagnosis not present

## 2018-02-02 NOTE — Progress Notes (Signed)
Patient ID: Patrick [name], male    DOB: 01/03/1932, 84 y.o.   MRN: [id number]  HPI  Mr Patrick [name] is a 84 y/o male with a history of chronic kidney disease (stage III-IV), diabetes on insulin, HTN, persistent atrial fibrillation, hyperlipidemia, anemia, remote tobacco use and chronic diastolic heart failure.  Last echo done 01/22/16 which showed an EF of 60-65%without any aortic stenosis and trivial tricuspid regurgitation. Elevated PA pressure of 70 mm Hg. This is an improvement from his previous echocardiogram which showed an EF of 40-45%.  Patient has not been admitted or been in the ED in the last 6 months.   Returns today for a follow-up visit with a chief complaint of minimal fatigue upon moderate exertion. He has associated easy bruising along with this. He denies difficulty sleeping, abdominal distention, palpitations, chest pain, shortness of breath, dizziness or weight gain.   Past Medical History:  Diagnosis Date  . Amputated finger   . Anemia of chronic disease    a. plan of oncology to start Procrit - received during admission 12/2013  . Atypical chest pain    a. 12/2014 Neg CE - in setting of L pleural effusion.  . Bell's palsy   . Chronic combined systolic and diastolic CHF (congestive heart failure) ([place])    a. 11/2012 Echo: EF 60-65%, mod conc LVH, mildly dil LA/RA, mild Ao sclerosis w/o stenosis; b. 11/2014 Echo: EF 40-45%, prob mid-apicalanteroseptal, ant, apical HK, Gr2 DD, mod dil LA, mildly dil RA (technically difficult study); c. echo 11/2015: EF 60-65%, trivial AI, nl RV sys fxan, PASP 59; d. echo 10/17: EF 60-65%, no RWMA, mildly dilated LA, mildly reduced RV sys fxn, PASP 70  . CKD (chronic kidney disease), stage III (HCC)    a. stage III-IV  . Diabetes mellitus without complication ([place])   . GERD (gastroesophageal reflux disease)   . History of gastroesophageal reflux (GERD)   . Hyperlipidemia   . Hypertension   . Permanent atrial fibrillation ([place])    a. Dx 12/2011, Rate-controlled, chronic Xarelto (renal dosing) - CHA2DS2VASc = 5.  . Pleural effusion, left    a. 12/2014 s/p thoracentesis - protein <3, LDH 123, no malignancy.   Past Surgical History:  Procedure Laterality Date  . APPENDECTOMY    . COLONOSCOPY  09/2011  . ESOPHAGOGASTRODUODENOSCOPY    . FINGER SURGERY     right hand   Family History  Problem Relation Age of Onset  . Heart attack Brother   . Diabetes Mother   . Diabetes Sister   . Hypertension Unknown    Social History   Tobacco Use  . Smoking status: Former Smoker    Packs/day: 0.25    Years: 10.00    Pack years: 2.50    Types: Cigars    Last attempt to quit: 05/06/1966    Years since quitting: 51.7  . Smokeless tobacco: Never Used  Substance Use Topics  . Alcohol use: No    Alcohol/week: 0.0 standard drinks   Allergies  Allergen Reactions  . Morphine     Other reaction(s): Other (See Comments) Out of it while in the hospital   Prior to Admission medications   Medication Sig Start Date End Date Taking? Authorizing Provider  cloNIDine (CATAPRES) 0.3 MG tablet Take 1 tablet (0.3 mg total) by mouth 2 (two) times daily. Patient taking differently: Take 0.2 mg by mouth 2 (two) times daily.  12/12/17  Yes Gollan, Kathlene November, MD  insulin NPH-regular Human (NOVOLIN 70/30) (  70-30) 100 UNIT/ML injection Inject 35 Units into the skin 2 (two) times daily with a meal. Patient taking differently: Inject 50 Units into the skin 2 (two) times daily with a meal.  07/02/16  Yes Gladstone Lighter, MD  losartan (COZAAR) 50 MG tablet Take 1 tablet (50 mg total) by mouth daily. 07/02/16  Yes Gladstone Lighter, MD  morphine (ROXANOL) 20 MG/ML concentrated solution Take 0.25 mLs (5 mg total) by mouth every 4 (four) hours as needed for severe pain or shortness of breath. 07/02/16  Yes Gladstone Lighter, MD  omeprazole (PRILOSEC) 40 MG capsule Take 1 capsule (40 mg total) by mouth daily. 08/29/17  Yes Gollan, Kathlene November, MD  OXYGEN  Inhale 2-4 L into the lungs continuous.   Yes [provider]  potassium chloride (K-DUR,KLOR-CON) 10 MEQ tablet TAKE 1 TABLET TWICE DAILY Patient taking differently: 10 mEq 2 (two) times daily.  11/06/17  Yes Minna Merritts, MD  Rivaroxaban (XARELTO) 15 MG TABS tablet Take 1 tablet (15 mg total) by mouth daily with supper. 04/02/17  Yes Minna Merritts, MD  sertraline (ZOLOFT) 25 MG tablet Take 50 mg by mouth daily.   Yes [provider]  torsemide (DEMADEX) 20 MG tablet Take 2 tablets (40 mg total) by mouth 2 (two) times daily as needed. Patient taking differently: Take 20 mg by mouth 2 (two) times daily.  08/29/17  Yes Minna Merritts, MD  glucose blood (ACCU-CHEK SMARTVIEW) test strip Use one strip  To check  Glucose twice a day Patient not taking: Reported on 02/02/2018 06/05/17   Minna Merritts, MD  senna-docusate (SENOKOT-S) 8.6-50 MG tablet Take 2 tablets by mouth daily.    [provider]    Review of Systems  Constitutional: Positive for fatigue. Negative for appetite change.  HENT: Negative for congestion, postnasal drip and sore throat.   Eyes: Negative.   Respiratory: Negative for chest tightness and shortness of breath.   Cardiovascular: Negative for chest pain, palpitations and leg swelling.  Gastrointestinal: Negative for abdominal distention and abdominal pain.  Endocrine: Negative.   Genitourinary: Negative.   Musculoskeletal: Negative for back pain and neck pain.  Skin: Negative.   Allergic/Immunologic: Negative.   Neurological: Negative for dizziness and light-headedness.  Hematological: Negative for adenopathy. Bruises/bleeds easily.  Psychiatric/Behavioral: Negative for dysphoric mood and sleep disturbance. The patient is not nervous/anxious.    Vitals:   02/02/18 1528  BP: (!) 147/76  Pulse: 92  Resp: 18  SpO2: 99%  Weight: 191 lb 6 oz (86.8 kg)  Height: 5\' 8"  (1.727 m)   Wt Readings from Last 3 Encounters:  02/02/18 191 lb 6  oz (86.8 kg)  12/12/17 196 lb 8 oz (89.1 kg)  10/15/17 195 lb 8 oz (88.7 kg)   Lab Results  Component Value Date   CREATININE 1.76 (H) 08/21/2016   CREATININE 1.51 (H) 08/13/2016   CREATININE 2.07 (H) 07/02/2016    Physical Exam  Constitutional: He is oriented to person, place, and time. He appears well-developed and well-nourished.  HENT:  Head: Normocephalic and atraumatic.  Neck: Normal range of motion. Neck supple. No JVD present.  Cardiovascular: Normal rate and regular rhythm.  Pulmonary/Chest: Effort normal. He has no wheezes. He has no rales.  Abdominal: Soft. He exhibits no distension. There is no tenderness.  Musculoskeletal: He exhibits no edema or tenderness.  Neurological: He is alert and oriented to person, place, and time.  Skin: Skin is warm and dry.  Psychiatric: He has  a normal mood and affect. His behavior is normal. Thought content normal.  Nursing note and vitals reviewed.  Assessment & Plan:  1: Chronic heart failure with preserved ejection fraction-  - NYHA class II - euvolemic today - Continue daily weights and call for an overnight weight gain of >2 pounds or weekly weight gain of >5 pounds.  - weight down 5 pounds from last visit 7 months ago - does try to stay active at home with doing some walking with his walker - not adding salt to his food - last saw cardiologist [name]) on 12/12/17 - saw pulmonologist Alva Garnet) 05/17/16  2: HTN- - BP looks good today - saw PCP Kary Kos) 11/06/16 - BMP from 08/21/16 reviewed and showed sodium 142, potassium 3.7, creatinine 1.76 and GFR 34  3: CKD, stage III-IV- - remains uninterested in dialysis - saw nephrology (Kolluru) last week - GFR improving slightly  4: Diabetes- - checking blood sugar twice a day   Reviewed medication list with wife.   Return in 6 months or sooner for any questions/problems before then.

## 2018-02-02 NOTE — Patient Instructions (Signed)
Continue weighing daily and call for an overnight weight gain of > 2 pounds or a weekly weight gain of >5 pounds. 

## 2018-02-16 DIAGNOSIS — N183 Chronic kidney disease, stage 3 (moderate): Secondary | ICD-10-CM | POA: Diagnosis not present

## 2018-02-16 DIAGNOSIS — J9601 Acute respiratory failure with hypoxia: Secondary | ICD-10-CM | POA: Diagnosis not present

## 2018-02-16 DIAGNOSIS — I5042 Chronic combined systolic (congestive) and diastolic (congestive) heart failure: Secondary | ICD-10-CM | POA: Diagnosis not present

## 2018-02-16 DIAGNOSIS — J449 Chronic obstructive pulmonary disease, unspecified: Secondary | ICD-10-CM | POA: Diagnosis not present

## 2018-03-18 DIAGNOSIS — I5042 Chronic combined systolic (congestive) and diastolic (congestive) heart failure: Secondary | ICD-10-CM | POA: Diagnosis not present

## 2018-03-18 DIAGNOSIS — J9601 Acute respiratory failure with hypoxia: Secondary | ICD-10-CM | POA: Diagnosis not present

## 2018-03-18 DIAGNOSIS — J449 Chronic obstructive pulmonary disease, unspecified: Secondary | ICD-10-CM | POA: Diagnosis not present

## 2018-03-18 DIAGNOSIS — N183 Chronic kidney disease, stage 3 (moderate): Secondary | ICD-10-CM | POA: Diagnosis not present

## 2018-03-19 DIAGNOSIS — L03032 Cellulitis of left toe: Secondary | ICD-10-CM | POA: Diagnosis not present

## 2018-03-19 DIAGNOSIS — M19072 Primary osteoarthritis, left ankle and foot: Secondary | ICD-10-CM | POA: Diagnosis not present

## 2018-04-12 NOTE — Progress Notes (Deleted)
[place]  [phone]) [phone] [phone]) [phone]  ID: SIRE POET OB: 07-27-1931  MR#: [id number]  MVH#:[id number]  Patient Care Team: Maryland Pink, MD as PCP - General (Family Medicine) Alisa Graff, FNP as Nurse Practitioner (Family Medicine) Idolina Primer, Areta Haber, PA-C as Physician Assistant (Cardiology) Lloyd Huger, MD as Consulting Physician (Hematology) Lavonia Dana, MD as Consulting Physician (Nephrology) Gabriel Carina Betsey Holiday, MD as Physician Assistant (Endocrinology)  CHIEF COMPLAINT:  Iron deficiency anemia, anemia secondary to chronic renal failure.  INTERVAL HISTORY: Patient returns to clinic today for repeat laboratory work, further evaluation, and consideration of additional IV Feraheme.  His chronic weakness and fatigue is unchanged. He also continues to have chronic shortness of breath and requires oxygen 24 hours a day.  He has no neurologic complaints. He has a fair appetite, but denies any weight loss.  He denies any recent fevers or illnesses.  He denies any chest pain, cough, or hemoptysis. He has no nausea, vomiting, constipation, or diarrhea.  He denies any melena or hematochezia.  He has no urinary complaints.  Patient offers no further specific complaints today.   REVIEW OF SYSTEMS:   Review of Systems  Constitutional: Positive for malaise/fatigue. Negative for fever and weight loss.  Respiratory: Positive for shortness of breath. Negative for cough.   Cardiovascular: Negative.  Negative for chest pain and leg swelling.  Gastrointestinal: Negative.  Negative for abdominal pain, blood in stool, melena and nausea.  Genitourinary: Negative.  Negative for hematuria.  Musculoskeletal: Negative.  Negative for back pain.  Skin: Negative.  Negative for rash.  Neurological: Positive for weakness. Negative for tingling and speech change.  Psychiatric/Behavioral: Negative.  The patient is not nervous/anxious.     As per HPI. Otherwise,  a complete review of systems is negative.  PAST MEDICAL HISTORY: Past Medical History:  Diagnosis Date  . Amputated finger   . Anemia of chronic disease    a. plan of oncology to start Procrit - received during admission 12/2013  . Atypical chest pain    a. 12/2014 Neg CE - in setting of L pleural effusion.  . Bell's palsy   . Chronic combined systolic and diastolic CHF (congestive heart failure) ([place])    a. 11/2012 Echo: EF 60-65%, mod conc LVH, mildly dil LA/RA, mild Ao sclerosis w/o stenosis; b. 11/2014 Echo: EF 40-45%, prob mid-apicalanteroseptal, ant, apical HK, Gr2 DD, mod dil LA, mildly dil RA (technically difficult study); c. echo 11/2015: EF 60-65%, trivial AI, nl RV sys fxan, PASP 59; d. echo 10/17: EF 60-65%, no RWMA, mildly dilated LA, mildly reduced RV sys fxn, PASP 70  . CKD (chronic kidney disease), stage III (HCC)    a. stage III-IV  . Diabetes mellitus without complication ([place])   . GERD (gastroesophageal reflux disease)   . History of gastroesophageal reflux (GERD)   . Hyperlipidemia   . Hypertension   . Permanent atrial fibrillation    a. Dx 12/2011, Rate-controlled, chronic Xarelto (renal dosing) - CHA2DS2VASc = 5.  . Pleural effusion, left    a. 12/2014 s/p thoracentesis - protein <3, LDH 123, no malignancy.    PAST SURGICAL HISTORY: Past Surgical History:  Procedure Laterality Date  . APPENDECTOMY    . COLONOSCOPY  09/2011  . ESOPHAGOGASTRODUODENOSCOPY    . FINGER SURGERY     right hand    FAMILY HISTORY Family History  Problem Relation Age of Onset  . Heart attack Brother   . Diabetes Mother   . Diabetes  Sister   . Hypertension Unknown        ADVANCED DIRECTIVES:    HEALTH MAINTENANCE: Social History   Tobacco Use  . Smoking status: Former Smoker    Packs/day: 0.25    Years: 10.00    Pack years: 2.50    Types: Cigars    Last attempt to quit: 05/06/1966    Years since quitting: 51.9  . Smokeless tobacco: Never Used  Substance Use Topics    . Alcohol use: No    Alcohol/week: 0.0 standard drinks  . Drug use: No    Allergies  Allergen Reactions  . Morphine     Other reaction(s): Other (See Comments) Out of it while in the hospital    Current Outpatient Medications  Medication Sig Dispense Refill  . cloNIDine (CATAPRES) 0.3 MG tablet Take 1 tablet (0.3 mg total) by mouth 2 (two) times daily. (Patient taking differently: Take 0.2 mg by mouth 2 (two) times daily. ) 180 tablet 3  . glucose blood (ACCU-CHEK SMARTVIEW) test strip Use one strip  To check  Glucose twice a day (Patient not taking: Reported on 02/02/2018) 100 each 6  . insulin NPH-regular Human (NOVOLIN 70/30) (70-30) 100 UNIT/ML injection Inject 35 Units into the skin 2 (two) times daily with a meal. (Patient taking differently: Inject 50 Units into the skin 2 (two) times daily with a meal. ) 10 mL 11  . losartan (COZAAR) 50 MG tablet Take 1 tablet (50 mg total) by mouth daily. 30 tablet 2  . morphine (ROXANOL) 20 MG/ML concentrated solution Take 0.25 mLs (5 mg total) by mouth every 4 (four) hours as needed for severe pain or shortness of breath. 30 mL 0  . omeprazole (PRILOSEC) 40 MG capsule Take 1 capsule (40 mg total) by mouth daily. 90 capsule 0  . OXYGEN Inhale 2-4 L into the lungs continuous.    . potassium chloride (K-DUR,KLOR-CON) 10 MEQ tablet TAKE 1 TABLET TWICE DAILY (Patient taking differently: 10 mEq 2 (two) times daily. ) 180 tablet 1  . Rivaroxaban (XARELTO) 15 MG TABS tablet Take 1 tablet (15 mg total) by mouth daily with supper. 30 tablet 11  . senna-docusate (SENOKOT-S) 8.6-50 MG tablet Take 2 tablets by mouth daily.    . sertraline (ZOLOFT) 25 MG tablet Take 50 mg by mouth daily.    Marland Kitchen torsemide (DEMADEX) 20 MG tablet Take 2 tablets (40 mg total) by mouth 2 (two) times daily as needed. (Patient taking differently: Take 20 mg by mouth 2 (two) times daily. ) 360 tablet 3   No current facility-administered medications for this visit.     Facility-Administered Medications Ordered in Other Visits  Medication Dose Route Frequency Provider Last Rate Last Dose  . epoetin alfa (EPOGEN,PROCRIT) injection 40,000 Units  40,000 Units Subcutaneous Once Lloyd Huger, MD        OBJECTIVE: There were no vitals filed for this visit.   There is no height or weight on file to calculate BMI.    ECOG FS:2 - Symptomatic, <50% confined to bed  General: Well-developed, well-nourished, no acute distress. Eyes: Pink conjunctiva, anicteric sclera. HEENT: Normocephalic, moist mucous membranes. Lungs: Clear to auscultation bilaterally. Heart: Regular rate and rhythm. No rubs, murmurs, or gallops. Abdomen: Soft, nontender, nondistended. No organomegaly noted, normoactive bowel sounds. Musculoskeletal: No edema, cyanosis, or clubbing. Neuro: Alert, answering all questions appropriately. Cranial nerves grossly intact. Skin: No rashes or petechiae noted. Psych: Normal affect.  LAB RESULTS:  Lab Results  Component Value  Date   NA 142 08/21/2016   K 3.7 08/21/2016   CL 103 08/21/2016   CO2 31 08/21/2016   GLUCOSE 195 (H) 08/21/2016   BUN 25 (H) 08/21/2016   CREATININE 1.76 (H) 08/21/2016   CALCIUM 9.1 08/21/2016   PROT 7.3 12/20/2015   ALBUMIN 3.3 (L) 12/20/2015   AST 26 12/20/2015   ALT 15 (L) 12/20/2015   ALKPHOS 87 12/20/2015   BILITOT 0.9 12/20/2015   GFRNONAA 34 (L) 08/21/2016   GFRAA 39 (L) 08/21/2016    Lab Results  Component Value Date   WBC 6.9 10/15/2017   NEUTROABS 5.3 10/15/2017   HGB 11.5 (L) 10/15/2017   HCT 34.4 (L) 10/15/2017   MCV 85.7 10/15/2017   PLT 255 10/15/2017   Lab Results  Component Value Date   IRON 35 (L) 10/15/2017   TIBC 346 10/15/2017   IRONPCTSAT 10 (L) 10/15/2017    Lab Results  Component Value Date   FERRITIN 44 10/15/2017     STUDIES: No results found.  ASSESSMENT: Iron deficiency anemia, anemia secondary to chronic renal failure.  PLAN:    1. Iron deficiency anemia,  anemia secondary to chronic renal failure: Patient's hemoglobin has trended down slightly to 11.5 and his iron stores remain significantly decreased.  We will proceed with one infusion of 510 mg IV Feraheme today.  Patient has not required Procrit since September 23, 2016.  Return to clinic in 6 months with repeat laboratory work and further evaluation.  2. Lingula mass/mediastinal lymphadenopathy: Despite concern for underlying malignancy, possibly lymphoma, patient previously elected not to pursue biopsy and continue simple observation. Peripheral blood flow cytometry is negative.   3. Chronic renal insufficiency: Chronic and unchanged.  Continue monitoring and treatment per nephrology. 4. CHF: Continue evaluation and treatment per cardiology. 5. Leg numbness: Patient does not complain of this today.  I spent a total of 30 minutes face-to-face with the patient of which greater than 50% of the visit was spent in counseling and coordination of care as detailed above.  Patient expressed understanding and was in agreement with this plan. He also understands that He can call clinic at any time with any questions, concerns, or complaints.    Lloyd Huger, MD   04/12/2018 8:23 AM

## 2018-04-13 ENCOUNTER — Inpatient Hospital Stay: Payer: Medicare HMO | Admitting: Oncology

## 2018-04-13 ENCOUNTER — Inpatient Hospital Stay: Payer: Medicare HMO

## 2018-04-13 ENCOUNTER — Other Ambulatory Visit: Payer: Self-pay | Admitting: *Deleted

## 2018-04-13 ENCOUNTER — Inpatient Hospital Stay: Payer: Medicare HMO | Attending: Oncology

## 2018-04-13 DIAGNOSIS — D509 Iron deficiency anemia, unspecified: Secondary | ICD-10-CM

## 2018-04-13 NOTE — Progress Notes (Signed)
cb

## 2018-04-15 ENCOUNTER — Ambulatory Visit: Payer: Medicare HMO | Admitting: Oncology

## 2018-04-15 ENCOUNTER — Ambulatory Visit: Payer: Medicare HMO

## 2018-04-15 ENCOUNTER — Other Ambulatory Visit: Payer: Medicare HMO

## 2018-04-18 DIAGNOSIS — J9601 Acute respiratory failure with hypoxia: Secondary | ICD-10-CM | POA: Diagnosis not present

## 2018-04-18 DIAGNOSIS — I5042 Chronic combined systolic (congestive) and diastolic (congestive) heart failure: Secondary | ICD-10-CM | POA: Diagnosis not present

## 2018-04-18 DIAGNOSIS — J449 Chronic obstructive pulmonary disease, unspecified: Secondary | ICD-10-CM | POA: Diagnosis not present

## 2018-04-18 DIAGNOSIS — N183 Chronic kidney disease, stage 3 (moderate): Secondary | ICD-10-CM | POA: Diagnosis not present

## 2018-04-28 ENCOUNTER — Other Ambulatory Visit: Payer: Self-pay | Admitting: Cardiovascular Disease

## 2018-04-28 MED ORDER — RIVAROXABAN 15 MG PO TABS: 15.0000 mg | ORAL_TABLET | Freq: Every day | ORAL | 6 refills | Status: DC

## 2018-04-28 NOTE — Telephone Encounter (Signed)
Please review for refill.  

## 2018-04-28 NOTE — Telephone Encounter (Signed)
°*  STAT* If patient is at the pharmacy, call can be transferred to refill team.   1. Which medications need to be refilled? (please list name of each medication and dose if known)  Xarelto 15 MG 1 tablet daily   2. Which pharmacy/location (including street and city if local pharmacy) is medication to be sent to? [facility] on [address]   3. Do they need a 30 day or 90 day supply? 30 day

## 2018-05-19 DIAGNOSIS — J449 Chronic obstructive pulmonary disease, unspecified: Secondary | ICD-10-CM | POA: Diagnosis not present

## 2018-05-19 DIAGNOSIS — N183 Chronic kidney disease, stage 3 (moderate): Secondary | ICD-10-CM | POA: Diagnosis not present

## 2018-05-19 DIAGNOSIS — I5042 Chronic combined systolic (congestive) and diastolic (congestive) heart failure: Secondary | ICD-10-CM | POA: Diagnosis not present

## 2018-05-19 DIAGNOSIS — J9601 Acute respiratory failure with hypoxia: Secondary | ICD-10-CM | POA: Diagnosis not present

## 2018-05-26 DIAGNOSIS — J9811 Atelectasis: Secondary | ICD-10-CM | POA: Diagnosis not present

## 2018-05-26 DIAGNOSIS — R0781 Pleurodynia: Secondary | ICD-10-CM | POA: Diagnosis not present

## 2018-05-29 ENCOUNTER — Telehealth: Payer: Self-pay | Admitting: Nurse Practitioner

## 2018-05-29 NOTE — Telephone Encounter (Signed)
I called Patrick [name] to schedule in-home PC f/u visit, message left to return call with contact information

## 2018-06-08 DIAGNOSIS — E1122 Type 2 diabetes mellitus with diabetic chronic kidney disease: Secondary | ICD-10-CM | POA: Diagnosis not present

## 2018-06-08 DIAGNOSIS — I10 Essential (primary) hypertension: Secondary | ICD-10-CM | POA: Diagnosis not present

## 2018-06-08 DIAGNOSIS — N186 End stage renal disease: Secondary | ICD-10-CM | POA: Diagnosis not present

## 2018-06-08 DIAGNOSIS — E785 Hyperlipidemia, unspecified: Secondary | ICD-10-CM | POA: Diagnosis not present

## 2018-06-09 DIAGNOSIS — N186 End stage renal disease: Secondary | ICD-10-CM | POA: Diagnosis not present

## 2018-06-09 DIAGNOSIS — E1122 Type 2 diabetes mellitus with diabetic chronic kidney disease: Secondary | ICD-10-CM | POA: Diagnosis not present

## 2018-06-09 DIAGNOSIS — I10 Essential (primary) hypertension: Secondary | ICD-10-CM | POA: Diagnosis not present

## 2018-06-10 DIAGNOSIS — R8290 Unspecified abnormal findings in urine: Secondary | ICD-10-CM | POA: Diagnosis not present

## 2018-06-16 ENCOUNTER — Other Ambulatory Visit: Payer: Medicare HMO | Admitting: Nurse Practitioner

## 2018-06-16 ENCOUNTER — Other Ambulatory Visit: Payer: Self-pay

## 2018-06-17 DIAGNOSIS — I4819 Other persistent atrial fibrillation: Secondary | ICD-10-CM | POA: Diagnosis not present

## 2018-06-17 DIAGNOSIS — J449 Chronic obstructive pulmonary disease, unspecified: Secondary | ICD-10-CM | POA: Diagnosis not present

## 2018-06-17 DIAGNOSIS — N183 Chronic kidney disease, stage 3 (moderate): Secondary | ICD-10-CM | POA: Diagnosis not present

## 2018-06-17 DIAGNOSIS — I5042 Chronic combined systolic (congestive) and diastolic (congestive) heart failure: Secondary | ICD-10-CM | POA: Diagnosis not present

## 2018-06-19 ENCOUNTER — Telehealth: Payer: Self-pay | Admitting: Cardiovascular Disease

## 2018-06-19 NOTE — Telephone Encounter (Signed)
Patient family member Vickie calling States they would like to know if Dr [name] can write a note to allow their caregiver Tharon Aquas to continue come in the afternoon Please call to discuss

## 2018-06-22 NOTE — Telephone Encounter (Signed)
Returned the pt wife call. [email or web address].

## 2018-06-23 NOTE — Telephone Encounter (Signed)
No answer. Left message to call back if they still have any questions or concerns.

## 2018-06-24 ENCOUNTER — Other Ambulatory Visit: Payer: Medicare HMO | Admitting: Nurse Practitioner

## 2018-06-24 ENCOUNTER — Encounter: Payer: Self-pay | Admitting: Nurse Practitioner

## 2018-06-24 DIAGNOSIS — R531 Weakness: Secondary | ICD-10-CM

## 2018-06-24 DIAGNOSIS — Z515 Encounter for palliative care: Secondary | ICD-10-CM | POA: Diagnosis not present

## 2018-06-24 DIAGNOSIS — R0602 Shortness of breath: Secondary | ICD-10-CM

## 2018-06-24 NOTE — Progress Notes (Signed)
[facility] Consult Note Telephone: [phone]  Fax: [phone]  PATIENT NAME: Patrick [name] DOB: 1932-01-14 MRN: [id number]  PRIMARY CARE PROVIDER:   Maryland Pink, MD  REFERRING PROVIDER:  Maryland Pink, MD [address] [facility] [place], [place] [address]  RESPONSIBLE PARTY:   Self; Simran Mannis, wife  RECOMMENDATIONS and PLAN:  1. Palliative care encounter Z51.5; Palliative medicine team will continue to support patient, patient's family, and medical team. Visit consisted of counseling and education dealing with the complex and emotionally intense issues of symptom management and palliative care in the setting of serious and potentially life-threatening illness  2. Generalized weakness R53.1  secondary to COPD/CHF Encourage energy conservation and rest times.  3. Dyspneic R06.00 secondary to CHF/COPD  Continue daily weights, respiratory status, inhalation therapy with continous O2   ASSESSMENT:     I visited and observed [name]. We talked about purpose of palliative care visit. We talked about the last time he was independent without oxygen, past medical history in the setting of chronic disease and natural aging. We talked about hospitalizations. We talked about upcoming doctor's appointment with Dr Jaymes Graff in one week. We talked about appointment time and exposure with covid-19 going to the office. Encourage [name], his wife to call the office to see what their recommendations are. Family does not have the ability to Telehealth but possibly do have the ability to face time. Also recommend using a mask in seeing if office is open to coming to the car to examine him. [name] endorses she will call the office to see what their recommendations are. We talked about chronic disease progression of congestive heart failure with COPD. We talked about O2 dependence. We talked about symptoms of shortness of breath  and requirement for oxygen. We talked about his functional level. We talked about his endurance and distances he's able to exert before becoming symptomatic with shortness of breath. We talked about medical goals of therapy including aggressive versus conservative versus comfort care. [name] talked about one of their experiences with one of the pulmonologist who told them that he wouldn't live but a few days this was last year and he was put in hospice. She shared that he was on hospice for six months and then discharged you to stability and that was a year ago. He continues to slowly decline overall [name] endorses. [name] talked about prognosis and really not knowing when his time would be. We talked about Hospice Services and what they provided. [name] endorses she was very thankful for [employer]. We talked prognosis and really not knowing when his time would be. We talked about Hospice Services and what they provided. [name] and door so she was very thankful for [employer]. We talked further discussion to see if [name] is back at the point where hospice would be beneficial and he would be eligible. [name] endorses that she is not sure. We talked about concern for previous discharge due to stability and concern for if he was put on service that in six months the possibility of discharge again if he still continues not to further decline. [name] and or says she will further discuss with her children in addition to Mr. Manna.. Mr. Hymas. endorses that he feels fine. We talked about upcoming appointment with Dr Jaymes Graff and will send message to [facility] nurse practitioner at heart failure clinic to get her input as well concerning hospice. We talked about medical goals  of care including aggressive versus conservative versus Comfort Care. At present time he is a full code. We talked about where he wants to be when he is in the end part of his life and passes if it would be in the  hospital or the back of an EMS truck having CPR or at home with his family and hospice. [name] endorses as well as family that their wishes are to have him at home when he passes with family and comfortable. We talked for about code status. I talked with [name] about code status and he endorses that he would not want to have CPR but wants to further discuss with his family. He previously was on a ventilator for a short but does not remember it. He is not sure if he wants that again. We talked about most form and reviewed. [place] book shared and left with [name] his wife. We talked about role of palliative care and plan of care. Discuss that will schedule a follow-up visit in two to three weeks for further discussion of possible hospice and clinical re-evaluation. [name] in the meantime will discuss with Dr Jaymes Graff Who has followed Mr. Sternberg for some time in addition to her children. Life review completed. Joneen Boers his son talked about his nephew, the [place] grandson who passed away at 28 from a traumatic motor vehicle accident. We talked about coping strategies with progression of life experiences. Therapeutic listening and emotional support provided. Contact information provided. Questions answered to satisfaction. [name] express she was thankful her palliative care visit and discussion.  Weight 187.4  I spent 60 minutes providing this consultation,  from 3:30pm to 4:30pm. More than 50% of the time in this consultation was spent coordinating communication.   HISTORY OF PRESENT ILLNESS:  [name] is a 85 y.o. year old male with multiple medical problems including  Diastolic and systolic congestive heart failure, atrial fibrillation, chronic kidney disease, diabetes, neuropathy,  gastritis 9 / 29 / 2010 with EGD one gastric polyp,  obesity,  sleep disorder possible OSA, vitamin D deficiency, hypertension anemia of chronic disease, Bell's palsy, gerd, hyperlipidemia,  amputated finger, appendectomy, lingular mass. Melodie Bouillon of the congestive heart failure Clinic 11/11 / 2019 with lacks Echo done 10 / 30 / 2017 showing EF 60 to 65% without any aortic stenosis and tricuspid regurg with elevated PA pressures of 70 mm HG. This is an overall improvement from previous echocardiogram what showed EF of 40 to 45%. He is followed by cardiologist Dr. Candis Musa and pulmonologist Dr. Jamal Collin. He is also followed by Nephrology for chronic kidney disease Dr Juleen China and not interested in dialysis. He has been followed by dr. Grayland Ormond at hematology for iron deficiency anemia requiring iron infusions. 3/ 3 / 2020 seen for office visit for back pain after a fall and hitting the recliner. He lives at home with his wife. He is able to take 5 to 10 steps but then does become short of breath. He is on continuous oxygen. He does require assistance with adl's. He is able to feed himself with a good appetite. At present he is sitting in the living room in his recliner. He appears chronically ill. His son Joneen Boers and wife [name] are with him. Palliative Care was asked to help address goals of care.   CODE STATUS: full code  PPS: 40% HOSPICE ELIGIBILITY/DIAGNOSIS: TBD  PAST MEDICAL HISTORY:  Past Medical History:  Diagnosis Date   Amputated finger    Anemia of  chronic disease    a. plan of oncology to start Procrit - received during admission 12/2013   Atypical chest pain    a. 12/2014 Neg CE - in setting of L pleural effusion.   Bell's palsy    Chronic combined systolic and diastolic CHF (congestive heart failure) ([place])    a. 11/2012 Echo: EF 60-65%, mod conc LVH, mildly dil LA/RA, mild Ao sclerosis w/o stenosis; b. 11/2014 Echo: EF 40-45%, prob mid-apicalanteroseptal, ant, apical HK, Gr2 DD, mod dil LA, mildly dil RA (technically difficult study); c. echo 11/2015: EF 60-65%, trivial AI, nl RV sys fxan, PASP 59; d. echo 10/17: EF 60-65%, no RWMA, mildly dilated LA, mildly reduced RV sys fxn, PASP  70   CKD (chronic kidney disease), stage III (HCC)    a. stage III-IV   Diabetes mellitus without complication (HCC)    GERD (gastroesophageal reflux disease)    History of gastroesophageal reflux (GERD)    Hyperlipidemia    Hypertension    Permanent atrial fibrillation    a. Dx 12/2011, Rate-controlled, chronic Xarelto (renal dosing) - CHA2DS2VASc = 5.   Pleural effusion, left    a. 12/2014 s/p thoracentesis - protein <3, LDH 123, no malignancy.    SOCIAL HX:  Social History   Tobacco Use   Smoking status: Former Smoker    Packs/day: 0.25    Years: 10.00    Pack years: 2.50    Types: Cigars    Last attempt to quit: 05/06/1966    Years since quitting: 52.1   Smokeless tobacco: Never Used  Substance Use Topics   Alcohol use: No    Alcohol/week: 0.0 standard drinks    ALLERGIES:  Allergies  Allergen Reactions   Morphine     Other reaction(s): Other (See Comments) Out of it while in the hospital     PERTINENT MEDICATIONS:  Outpatient Encounter Medications as of 06/24/2018  Medication Sig   cloNIDine (CATAPRES) 0.3 MG tablet Take 1 tablet (0.3 mg total) by mouth 2 (two) times daily. (Patient taking differently: Take 0.2 mg by mouth 2 (two) times daily. )   glucose blood (ACCU-CHEK SMARTVIEW) test strip Use one strip  To check  Glucose twice a day (Patient not taking: Reported on 02/02/2018)   insulin NPH-regular Human (NOVOLIN 70/30) (70-30) 100 UNIT/ML injection Inject 35 Units into the skin 2 (two) times daily with a meal. (Patient taking differently: Inject 50 Units into the skin 2 (two) times daily with a meal. )   losartan (COZAAR) 50 MG tablet Take 1 tablet (50 mg total) by mouth daily.   morphine (ROXANOL) 20 MG/ML concentrated solution Take 0.25 mLs (5 mg total) by mouth every 4 (four) hours as needed for severe pain or shortness of breath.   omeprazole (PRILOSEC) 40 MG capsule Take 1 capsule (40 mg total) by mouth daily.   OXYGEN Inhale 2-4 L into  the lungs continuous.   potassium chloride (K-DUR,KLOR-CON) 10 MEQ tablet TAKE 1 TABLET TWICE DAILY (Patient taking differently: 10 mEq 2 (two) times daily. )   Rivaroxaban (XARELTO) 15 MG TABS tablet Take 1 tablet (15 mg total) by mouth daily with supper.   senna-docusate (SENOKOT-S) 8.6-50 MG tablet Take 2 tablets by mouth daily.   sertraline (ZOLOFT) 25 MG tablet Take 50 mg by mouth daily.   torsemide (DEMADEX) 20 MG tablet Take 2 tablets (40 mg total) by mouth 2 (two) times daily as needed. (Patient taking differently: Take 20 mg by mouth 2 (two) times daily. )  Facility-Administered Encounter Medications as of 06/24/2018  Medication   epoetin alfa (EPOGEN,PROCRIT) injection 40,000 Units    PHYSICAL EXAM:   General: obese, chronically ill, O2 dependent, weak pleasant male Cardiovascular: regular rate and rhythm Pulmonary: clear ant fields; decrease bases Abdomen: soft, nontender, + bowel sounds GU: no suprapubic tenderness Extremities: +BLE edema, no joint deformities Skin: no rashes Neurological: Weakness but otherwise nonfocal  Haddon Fyfe Ihor Gully, NP

## 2018-06-25 ENCOUNTER — Other Ambulatory Visit: Payer: Self-pay

## 2018-06-26 ENCOUNTER — Other Ambulatory Visit: Payer: Medicare HMO | Admitting: Nurse Practitioner

## 2018-07-18 DIAGNOSIS — I5042 Chronic combined systolic (congestive) and diastolic (congestive) heart failure: Secondary | ICD-10-CM | POA: Diagnosis not present

## 2018-07-18 DIAGNOSIS — I4819 Other persistent atrial fibrillation: Secondary | ICD-10-CM | POA: Diagnosis not present

## 2018-07-18 DIAGNOSIS — J449 Chronic obstructive pulmonary disease, unspecified: Secondary | ICD-10-CM | POA: Diagnosis not present

## 2018-07-18 DIAGNOSIS — N183 Chronic kidney disease, stage 3 (moderate): Secondary | ICD-10-CM | POA: Diagnosis not present

## 2018-07-23 ENCOUNTER — Telehealth: Payer: Self-pay | Admitting: Nurse Practitioner

## 2018-07-23 NOTE — Telephone Encounter (Signed)
I called and left a message for [name], Mr. Mathison wife for [facility] phone meeting scheduled for 3:30pm today
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
## 2018-07-24 ENCOUNTER — Telehealth: Payer: Self-pay | Admitting: Nurse Practitioner
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
## 2018-07-28 ENCOUNTER — Other Ambulatory Visit: Payer: Self-pay | Admitting: Cardiovascular Disease

## 2018-07-31 ENCOUNTER — Telehealth: Payer: Self-pay

## 2018-07-31 NOTE — Telephone Encounter (Signed)
Virtual Visit Pre-Appointment Phone Call  "Patrick [name], I am calling you today to discuss your upcoming appointment. We are currently trying to limit exposure to the virus that causes COVID-19 by seeing patients at home rather than in the office."  1. "What is the BEST phone number to call the day of the visit?" - include this in appointment notes  2. Do you have or have access to (through a family member/friend) a smartphone with video capability that we can use for your visit?" a. If yes - list this number in appt notes as cell (if different from BEST phone #) and list the appointment type as a VIDEO visit in appointment notes b. If no - list the appointment type as a PHONE visit in appointment notes  3. Confirm consent - "In the setting of the current Covid19 crisis, you are scheduled for a phone visit with your provider on Aug 05, 2018 at 9:20AM.  Just as we do with many in-office visits, in order for you to participate in this visit, we must obtain consent.  If you'd like, I can send this to your mychart (if signed up) or email for you to review.  Otherwise, I can obtain your verbal consent now.  All virtual visits are billed to your insurance company just like a normal visit would be.  By agreeing to a virtual visit, we'd like you to understand that the technology does not allow for your provider to perform an examination, and thus may limit your provider's ability to fully assess your condition. If your provider identifies any concerns that need to be evaluated in person, we will make arrangements to do so.  Finally, though the technology is pretty good, we cannot assure that it will always work on either your or our end, and in the setting of a video visit, we may have to convert it to a phone-only visit.  In either situation, we cannot ensure that we have a secure connection.  Are you willing to proceed?" STAFF: Did the patient verbally acknowledge consent to telehealth visit? Document  YES/NO here: YES PER WIFE VICKI (LISTED ON DPR)  4. Advise patient to be prepared - "Two hours prior to your appointment, go ahead and check your blood pressure, pulse, oxygen saturation, and your weight (if you have the equipment to check those) and write them all down. When your visit starts, your provider will ask you for this information. If you have an Apple Watch or Kardia device, please plan to have heart rate information ready on the day of your appointment. Please have a pen and paper handy nearby the day of the visit as well."  5. Give patient instructions for MyChart download to smartphone OR [email or web address] as below if video visit (depending on what platform provider is using)  6. Inform patient they will receive a phone call 15 minutes prior to their appointment time (may be from unknown caller ID) so they should be prepared to answer    [facility] has been deemed a candidate for a follow-up tele-health visit to limit community exposure during the Covid-19 pandemic. I spoke with the patient via phone to ensure availability of phone/video source, confirm preferred email & phone number, and discuss instructions and expectations.  I reminded Markevious Ehmke Bagent to be prepared with any vital sign and/or heart rhythm information that could potentially be obtained via home monitoring, at the time of his visit. I reminded Elman Dettman Beckworth to  expect a phone call prior to his visit.  Horton Finer 07/31/2018 3:55 PM    FULL LENGTH CONSENT FOR TELE-HEALTH VISIT   I hereby voluntarily request, consent and authorize [facility] and its employed or contracted physicians, physician assistants, nurse practitioners or other licensed health care professionals (the Practitioner), to provide me with telemedicine health care services (the Services") as deemed necessary by the treating Practitioner. I acknowledge and consent to receive the  Services by the Practitioner via telemedicine. I understand that the telemedicine visit will involve communicating with the Practitioner through live audiovisual communication technology and the disclosure of certain medical information by electronic transmission. I acknowledge that I have been given the opportunity to request an in-person assessment or other available alternative prior to the telemedicine visit and am voluntarily participating in the telemedicine visit.  I understand that I have the right to withhold or withdraw my consent to the use of telemedicine in the course of my care at any time, without affecting my right to future care or treatment, and that the Practitioner or I may terminate the telemedicine visit at any time. I understand that I have the right to inspect all information obtained and/or recorded in the course of the telemedicine visit and may receive copies of available information for a reasonable fee.  I understand that some of the potential risks of receiving the Services via telemedicine include:   Delay or interruption in medical evaluation due to technological equipment failure or disruption;  Information transmitted may not be sufficient (e.g. poor resolution of images) to allow for appropriate medical decision making by the Practitioner; and/or   In rare instances, security protocols could fail, causing a breach of personal health information.  Furthermore, I acknowledge that it is my responsibility to provide information about my medical history, conditions and care that is complete and accurate to the best of my ability. I acknowledge that Practitioner's advice, recommendations, and/or decision may be based on factors not within their control, such as incomplete or inaccurate data provided by me or distortions of diagnostic images or specimens that may result from electronic transmissions. I understand that the practice of medicine is not an exact science and that  Practitioner makes no warranties or guarantees regarding treatment outcomes. I acknowledge that I will receive a copy of this consent concurrently upon execution via email to the email address I last provided but may also request a printed copy by calling the office of [facility].    I understand that my insurance will be billed for this visit.   I have read or had this consent read to me.  I understand the contents of this consent, which adequately explains the benefits and risks of the Services being provided via telemedicine.   I have been provided ample opportunity to ask questions regarding this consent and the Services and have had my questions answered to my satisfaction.  I give my informed consent for the services to be provided through the use of telemedicine in my medical care  By participating in this telemedicine visit I agree to the above.

## 2018-08-03 ENCOUNTER — Telehealth: Payer: Medicare HMO | Admitting: Family

## 2018-08-04 NOTE — Progress Notes (Signed)
Virtual Visit via Telephone Note   This visit type was conducted due to national recommendations for restrictions regarding the COVID-19 Pandemic (e.g. social distancing) in an effort to limit this patient's exposure and mitigate transmission in our community.  Due to his co-morbid illnesses, this patient is at least at moderate risk for complications without adequate follow up.  This format is felt to be most appropriate for this patient at this time.  The patient did not have access to video technology/had technical difficulties with video requiring transitioning to audio format only (telephone).  All issues noted in this document were discussed and addressed.  No physical exam could be performed with this format.  Please refer to the patient's chart for his  consent to telehealth for [facility].   I connected with  Patrick [name] on 08/05/18 by a video enabled telemedicine application and verified that I am speaking with the correct person using two identifiers. I discussed the limitations of evaluation and management by telemedicine. The patient expressed understanding and agreed to proceed.   Evaluation Performed:  Follow-up visit  Date:  08/05/2018   ID:  Patrick, [name] 1932-03-15, MRN [id number]  Patient Location:  [place] [address]   Provider location:   Patrick [name], [place] office  PCP:  Patrick Pink, MD  Cardiologist:  Patrick [name]   Chief Complaint:  Blood pressure   History of Present Illness:    Patrick [name] is a 85 y.o. male who presents via audio/video conferencing for a telehealth visit today.   The patient does not symptoms concerning for COVID-19 infection (fever, chills, cough, or new SHORTNESS OF BREATH).   Patient has a past medical history of hypertension,  COPD,  emphysema diabetes,  hyperlipidemia,  GERD  Chronic atrial fibrillation Acute on chronic diastolic CHF, on  torsemide  presented to the hospital December 30 2011  in atrial fibrillation with RVR, started on rate controlling medications and discharged on Cardizem 300 mg daily with anticoagulation (xarelto).   lower extremity edema with Cardizem  Previous diagnosis of Bell's palsy  Hospital admission 01/03/2014 with shortness of breath, diastolic CHF, pleural effusions, anemia, responding well to diuresis  He presents for routine followup of his atrial fibrillation  Doing well,  Blood pressure running high at home typically 025 systolic BP 852/77 before medicine After medication 149/72 Pulse 58  Denies any lower extremity edema  Denies significant shortness of breath  Takes torsemide daily, sometimes twice a day for any weight gain   Tolerating Xarelto 15 daily  Sugars running high, 266 in march 2020 Wife reports sugars are up and down  Total chol 195, ldl 133 Creatinine 1.8  No recent falls  Other past medical history reviewed Previous hospital admission April 2018 with anemia, had blood  transfusion and iron infusion  Admission to the hospital 06/30/2016 for sepsis, pneumonia Creatinine 2.0  also with anemia,  Received one unit packed cells  In the hospital  Iron level low.     admitted in September 2017 with acute on chronic diastolic CHF.  Echo at that time showed normal EF with PASP 59 mmHg.  He was diuresed and sent home on torsemide every other day.   On 01/22/16 he developed sudden onset of SOB and presented to [facility] where he was found to have acute on chronic respiratory failure felt to be multifactorial including some component of CHF exacerbation and worsening anemia with a 3  gram drop from 11.3 during prior admission to 8.6   echo showed normal LV systolic function with an EF of 60-65%, nmo RWMA, mildly dilated left atrium, mildly increased RV wall thickness with mild reduction in systolic function, PASP increased at 70 mmHg.  CT chest showed underlying  emphysema, pulmonary nodule measuring 1.8 x1.1 cm extensive atherosclerotic calcification, ascending aorta measuring 4.1x37 cm    Prior CV studies:   The following studies were reviewed today:    Past Medical History:  Diagnosis Date  . Amputated finger   . Anemia of chronic disease    a. plan of oncology to start Procrit - received during admission 12/2013  . Atypical chest pain    a. 12/2014 Neg CE - in setting of L pleural effusion.  . Bell's palsy   . Chronic combined systolic and diastolic CHF (congestive heart failure) ([place])    a. 11/2012 Echo: EF 60-65%, mod conc LVH, mildly dil LA/RA, mild Ao sclerosis w/o stenosis; b. 11/2014 Echo: EF 40-45%, prob mid-apicalanteroseptal, ant, apical HK, Gr2 DD, mod dil LA, mildly dil RA (technically difficult study); c. echo 11/2015: EF 60-65%, trivial AI, nl RV sys fxan, PASP 59; d. echo 10/17: EF 60-65%, no RWMA, mildly dilated LA, mildly reduced RV sys fxn, PASP 70  . CKD (chronic kidney disease), stage III (HCC)    a. stage III-IV  . Diabetes mellitus without complication ([place])   . GERD (gastroesophageal reflux disease)   . History of gastroesophageal reflux (GERD)   . Hyperlipidemia   . Hypertension   . Permanent atrial fibrillation    a. Dx 12/2011, Rate-controlled, chronic Xarelto (renal dosing) - CHA2DS2VASc = 5.  . Pleural effusion, left    a. 12/2014 s/p thoracentesis - protein <3, LDH 123, no malignancy.   Past Surgical History:  Procedure Laterality Date  . APPENDECTOMY    . COLONOSCOPY  09/2011  . ESOPHAGOGASTRODUODENOSCOPY    . FINGER SURGERY     right hand     Current Meds  Medication Sig  . cloNIDine (CATAPRES) 0.3 MG tablet Take 1 tablet (0.3 mg total) by mouth 2 (two) times daily. (Patient taking differently: Take 0.2 mg by mouth 2 (two) times daily. )  . glucose blood (ACCU-CHEK SMARTVIEW) test strip Use one strip  To check  Glucose twice a day  . insulin NPH-regular Human (NOVOLIN 70/30) (70-30) 100 UNIT/ML  injection Inject 35 Units into the skin 2 (two) times daily with a meal. (Patient taking differently: Inject 50 Units into the skin 2 (two) times daily with a meal. )  . losartan (COZAAR) 50 MG tablet Take 1 tablet (50 mg total) by mouth daily.  Marland Kitchen morphine (ROXANOL) 20 MG/ML concentrated solution Take 0.25 mLs (5 mg total) by mouth every 4 (four) hours as needed for severe pain or shortness of breath.  Marland Kitchen omeprazole (PRILOSEC) 40 MG capsule Take 1 capsule (40 mg total) by mouth daily.  . OXYGEN Inhale 2-4 L into the lungs continuous.  . potassium chloride (K-DUR) 10 MEQ tablet TAKE 1 TABLET TWICE DAILY  . Rivaroxaban (XARELTO) 15 MG TABS tablet Take 1 tablet (15 mg total) by mouth daily with supper.  . senna-docusate (SENOKOT-S) 8.6-50 MG tablet Take 2 tablets by mouth daily.  . sertraline (ZOLOFT) 50 MG tablet Take 50 mg by mouth daily.   Marland Kitchen torsemide (DEMADEX) 20 MG tablet Take 20 mg by mouth 2 (two) times daily.  . [DISCONTINUED] torsemide (DEMADEX) 20 MG tablet Take 2 tablets (40 mg  total) by mouth 2 (two) times daily as needed. (Patient taking differently: Take 20 mg by mouth 2 (two) times daily. )     Allergies:   Morphine   Social History   Tobacco Use  . Smoking status: Former Smoker    Packs/day: 0.25    Years: 10.00    Pack years: 2.50    Types: Cigars    Last attempt to quit: 05/06/1966    Years since quitting: 52.2  . Smokeless tobacco: Never Used  Substance Use Topics  . Alcohol use: No    Alcohol/week: 0.0 standard drinks  . Drug use: No     Current Outpatient Medications on File Prior to Visit  Medication Sig Dispense Refill  . cloNIDine (CATAPRES) 0.3 MG tablet Take 1 tablet (0.3 mg total) by mouth 2 (two) times daily. (Patient taking differently: Take 0.2 mg by mouth 2 (two) times daily. ) 180 tablet 3  . glucose blood (ACCU-CHEK SMARTVIEW) test strip Use one strip  To check  Glucose twice a day 100 each 6  . insulin NPH-regular Human (NOVOLIN 70/30) (70-30) 100  UNIT/ML injection Inject 35 Units into the skin 2 (two) times daily with a meal. (Patient taking differently: Inject 50 Units into the skin 2 (two) times daily with a meal. ) 10 mL 11  . losartan (COZAAR) 50 MG tablet Take 1 tablet (50 mg total) by mouth daily. 30 tablet 2  . morphine (ROXANOL) 20 MG/ML concentrated solution Take 0.25 mLs (5 mg total) by mouth every 4 (four) hours as needed for severe pain or shortness of breath. 30 mL 0  . omeprazole (PRILOSEC) 40 MG capsule Take 1 capsule (40 mg total) by mouth daily. 90 capsule 0  . OXYGEN Inhale 2-4 L into the lungs continuous.    . potassium chloride (K-DUR) 10 MEQ tablet TAKE 1 TABLET TWICE DAILY 180 tablet 0  . Rivaroxaban (XARELTO) 15 MG TABS tablet Take 1 tablet (15 mg total) by mouth daily with supper. 30 tablet 6  . senna-docusate (SENOKOT-S) 8.6-50 MG tablet Take 2 tablets by mouth daily.    . sertraline (ZOLOFT) 50 MG tablet Take 50 mg by mouth daily.     Marland Kitchen torsemide (DEMADEX) 20 MG tablet Take 20 mg by mouth 2 (two) times daily.     Current Facility-Administered Medications on File Prior to Visit  Medication Dose Route Frequency Provider Last Rate Last Dose  . epoetin alfa (EPOGEN,PROCRIT) injection 40,000 Units  40,000 Units Subcutaneous Once Lloyd Huger, MD         Family Hx: The patient's family history includes Diabetes in his mother and sister; Heart attack in his brother; Hypertension in his unknown relative.  ROS:   Please see the history of present illness.    ROS    Labs/Other Tests and Data Reviewed:    Recent Labs: 10/15/2017: Hemoglobin 11.5; Platelets 255   Recent Lipid Panel Lab Results  Component Value Date/Time   CHOL 81 01/03/2014 04:33 AM   TRIG 111 01/03/2014 04:33 AM   HDL 16 (L) 01/03/2014 04:33 AM   LDLCALC 43 01/03/2014 04:33 AM    Wt Readings from Last 3 Encounters:  08/05/18 184 lb 6 oz (83.6 kg)  02/02/18 191 lb 6 oz (86.8 kg)  12/12/17 196 lb 8 oz (89.1 kg)     Exam:     Vital Signs: Vital signs may also be detailed in the HPI BP (!) 181/82 Comment: before meds  Pulse (!) 58  Ht 5\' 8"  (1.727 m)   Wt 184 lb 6 oz (83.6 kg)   BMI 28.03 kg/m   Wt Readings from Last 3 Encounters:  08/05/18 184 lb 6 oz (83.6 kg)  02/02/18 191 lb 6 oz (86.8 kg)  12/12/17 196 lb 8 oz (89.1 kg)   Temp Readings from Last 3 Encounters:  10/15/17 (!) 96.9 F (36.1 C) (Tympanic)  04/01/17 (!) 97.5 F (36.4 C) (Tympanic)  11/21/16 (!) 96.6 F (35.9 C) (Tympanic)   BP Readings from Last 3 Encounters:  08/05/18 (!) 181/82  02/02/18 (!) 147/76  12/12/17 (!) 157/95   Pulse Readings from Last 3 Encounters:  08/05/18 (!) 58  02/02/18 92  12/12/17 79    Blood pressure running high at home typically 158 systolic BP 309/40 before medicine After medication 149/72 Pulse 58  Well nourished, well developed male in no acute distress. Constitutional:  oriented to person, place, and time. No distress.     ASSESSMENT & PLAN:    Chronic diastolic heart failure (HCC) Euvolemic on his current dose of torsemide, no medication changes made Stressed importance that we get his blood pressure down  CKD (chronic kidney disease), stage III ([place]) Lab work reviewed from primary care creatinine 1.8, stable  Type 2 diabetes mellitus with stage 3 chronic kidney disease, with long-term current use of insulin (HCC)  Persistent atrial fibrillation On anticoagulation  Essential hypertension Unable to visualize leg swelling but wife reports it is minimal For blood pressure we will add amlodipine 5 mg daily This could be titrated upwards if no significant leg swelling on the medication Mixed hyperlipidemia  Hypertensive heart disease with chronic diastolic congestive heart failure (HCC) Systolic pressure typically 150, medication changes as above  Anemia of chronic disease  Orthostatic hypotension In follow-up will need to check orthostatics  COVID-19 Education: The signs and  symptoms of COVID-19 were discussed with the patient and how to seek care for testing (follow up with PCP or arrange E-visit).  The importance of social distancing was discussed today.  Patient Risk:   After full review of this patients clinical status, I feel that they are at least moderate risk at this time.  Time:   Today, I have spent 25 minutes with the patient with telehealth technology discussing the cardiac and medical problems/diagnoses detailed above   10 min spent reviewing the chart prior to patient visit today   Medication Adjustments/Labs and Tests Ordered: Current medicines are reviewed at length with the patient today.  Concerns regarding medicines are outlined above.   Tests Ordered: No tests ordered   Medication Changes: No changes made   Disposition: Follow-up in 6 months   Signed, Ida Rogue, MD  08/05/2018 9:57 AM    [place] [facility] [address] [place] [address], [place], [place] [address]

## 2018-08-05 ENCOUNTER — Encounter: Payer: Self-pay | Admitting: Cardiovascular Disease

## 2018-08-05 ENCOUNTER — Other Ambulatory Visit: Payer: Self-pay

## 2018-08-05 ENCOUNTER — Telehealth (INDEPENDENT_AMBULATORY_CARE_PROVIDER_SITE_OTHER): Payer: Medicare HMO | Admitting: Cardiovascular Disease

## 2018-08-05 VITALS — BP 181/82 | HR 58 | Ht 68.0 in | Wt 184.4 lb

## 2018-08-05 DIAGNOSIS — N183 Chronic kidney disease, stage 3 unspecified: Secondary | ICD-10-CM

## 2018-08-05 DIAGNOSIS — E782 Mixed hyperlipidemia: Secondary | ICD-10-CM

## 2018-08-05 DIAGNOSIS — I951 Orthostatic hypotension: Secondary | ICD-10-CM | POA: Diagnosis not present

## 2018-08-05 DIAGNOSIS — I5032 Chronic diastolic (congestive) heart failure: Secondary | ICD-10-CM

## 2018-08-05 DIAGNOSIS — Z794 Long term (current) use of insulin: Secondary | ICD-10-CM

## 2018-08-05 DIAGNOSIS — I110 Hypertensive heart disease with heart failure: Secondary | ICD-10-CM

## 2018-08-05 DIAGNOSIS — I130 Hypertensive heart and chronic kidney disease with heart failure and stage 1 through stage 4 chronic kidney disease, or unspecified chronic kidney disease: Secondary | ICD-10-CM | POA: Diagnosis not present

## 2018-08-05 DIAGNOSIS — D638 Anemia in other chronic diseases classified elsewhere: Secondary | ICD-10-CM

## 2018-08-05 DIAGNOSIS — I4819 Other persistent atrial fibrillation: Secondary | ICD-10-CM

## 2018-08-05 DIAGNOSIS — E1122 Type 2 diabetes mellitus with diabetic chronic kidney disease: Secondary | ICD-10-CM

## 2018-08-05 DIAGNOSIS — I10 Essential (primary) hypertension: Secondary | ICD-10-CM

## 2018-08-05 MED ORDER — AMLODIPINE BESYLATE 5 MG PO TABS: 5.0000 mg | ORAL_TABLET | Freq: Every day | ORAL | 3 refills | Status: AC

## 2018-08-05 NOTE — Patient Instructions (Addendum)
Medication Instructions:  Your physician has recommended you make the following change in your medication:  1. START Amlodipine 5 mg once daily  Your physician has requested that you regularly monitor and record your blood pressure readings at home. Please use the same machine at the same time of day to check your readings and record them to bring to your follow-up visit.  See below for additional information on blood pressure monitoring.   If you need a refill on your cardiac medications before your next appointment, please call your pharmacy.    Lab work: No new labs needed   If you have labs (blood work) drawn today and your tests are completely normal, you will receive your results only by: Marland Kitchen MyChart Message (if you have MyChart) OR . A paper copy in the mail If you have any lab test that is abnormal or we need to change your treatment, we will call you to review the results.   Testing/Procedures: No new testing needed   Follow-Up: At [facility], you and your health needs are our priority.  As part of our continuing mission to provide you with exceptional heart care, we have created designated Provider Care Teams.  These Care Teams include your primary Cardiologist (physician) and Advanced Practice Providers (APPs -  Physician Assistants and Nurse Practitioners) who all work together to provide you with the care you need, when you need it.  . You will need a follow up appointment in 2 months  . Providers on your designated Care Team:   . Murray Hodgkins, NP . Christell Faith, PA-C . Marrianne Mood, PA-C  Any Other Special Instructions Will Be Listed Below (If Applicable).  For educational health videos Log in to : [email or web address] Or : [email or web address], password : [email or web address]   How to Take Your Blood Pressure You can take your blood pressure at home with a machine. You may need to check your blood pressure at home:  To check if you have high blood pressure  (hypertension).  To check your blood pressure over time.  To make sure your blood pressure medicine is working. Supplies needed: You will need a blood pressure machine, or monitor. You can buy one at a drugstore or online. When choosing one:  Choose one with an arm cuff.  Choose one that wraps around your upper arm. Only one finger should fit between your arm and the cuff.  Do not choose one that measures your blood pressure from your wrist or finger. Your doctor can suggest a monitor. How to prepare Avoid these things for 30 minutes before checking your blood pressure:  Drinking caffeine.  Drinking alcohol.  Eating.  Smoking.  Exercising. Five minutes before checking your blood pressure:  Pee.  Sit in a dining chair. Avoid sitting in a soft couch or armchair.  Be quiet. Do not talk. How to take your blood pressure Follow the instructions that came with your machine. If you have a digital blood pressure monitor, these may be the instructions: 1. Sit up straight. 2. Place your feet on the floor. Do not cross your ankles or legs. 3. Rest your left arm at the level of your heart. You may rest it on a table, desk, or chair. 4. Pull up your shirt sleeve. 5. Wrap the blood pressure cuff around the upper part of your left arm. The cuff should be 1 inch (2.5 cm) above your elbow. It is best to wrap the cuff around bare skin. 6. Fit the cuff snugly  around your arm. You should be able to place only one finger between the cuff and your arm. 7. Put the cord inside the groove of your elbow. 8. Press the power button. 9. Sit quietly while the cuff fills with air and loses air. 10. Write down the numbers on the screen. 11. Wait 2-3 minutes and then repeat steps 1-10. What do the numbers mean? Two numbers make up your blood pressure. The first number is called systolic pressure. The second is called diastolic pressure. An example of a blood pressure reading is "120 over 80" (or  120/80). If you are an adult and do not have a medical condition, use this guide to find out if your blood pressure is normal: Normal  First number: below 120.  Second number: below 80. Elevated  First number: 120-129.  Second number: below 80. Hypertension stage 1  First number: 130-139.  Second number: 80-89. Hypertension stage 2  First number: 140 or above.  Second number: 7 or above. Your blood pressure is above normal even if only the top or bottom number is above normal. Follow these instructions at home:  Check your blood pressure as often as your doctor tells you to.  Take your monitor to your next doctor's appointment. Your doctor will: ? Make sure you are using it correctly. ? Make sure it is working right.  Make sure you understand what your blood pressure numbers should be.  Tell your doctor if your medicines are causing side effects. Contact a doctor if:  Your blood pressure keeps being high. Get help right away if:  Your first blood pressure number is higher than 180.  Your second blood pressure number is higher than 120. This information is not intended to replace advice given to you by your health care provider. Make sure you discuss any questions you have with your health care provider. Document Released: 02/22/2008 Document Revised: 02/07/2016 Document Reviewed: 08/18/2015 Elsevier Interactive Patient Education  2019 Elsevier Inc.  Blood Pressure Record Sheet To take your blood pressure, you will need a blood pressure machine. You can buy a blood pressure machine (blood pressure monitor) at your clinic, drug store, or online. When choosing one, consider:  An automatic monitor that has an arm cuff.  A cuff that wraps snugly around your upper arm. You should be able to fit only one finger between your arm and the cuff.  A device that stores blood pressure reading results.  Do not choose a monitor that measures your blood pressure from your  wrist or finger. Follow your health care provider's instructions for how to take your blood pressure. To use this form:  Get one reading in the morning (a.m.) before you take any medicines.  Get one reading in the evening (p.m.) before supper.  Take at least 2 readings with each blood pressure check. This makes sure the results are correct. Wait 1-2 minutes between measurements.  Write down the results in the spaces on this form.  Repeat this once a week, or as told by your health care provider.  Make a follow-up appointment with your health care provider to discuss the results. Blood pressure log Date: _______________________  a.m. _____________________(1st reading) _____________________(2nd reading)  p.m. _____________________(1st reading) _____________________(2nd reading) Date: _______________________  a.m. _____________________(1st reading) _____________________(2nd reading)  p.m. _____________________(1st reading) _____________________(2nd reading) Date: _______________________  a.m. _____________________(1st reading) _____________________(2nd reading)  p.m. _____________________(1st reading) _____________________(2nd reading) Date: _______________________  a.m. _____________________(1st reading) _____________________(2nd reading)  p.m. _____________________(1st reading) _____________________(2nd reading) Date: _______________________  a.m. _____________________(1st reading) _____________________(2nd reading)  p.m. _____________________(1st reading) _____________________(2nd reading) This information is not intended to replace advice given to you by your health care provider. Make sure you discuss any questions you have with your health care provider. Document Released: 12/08/2002 Document Revised: 03/11/2017 Document Reviewed: 03/11/2017 Elsevier Interactive Patient Education  2019 [employer].

## 2018-08-06 ENCOUNTER — Telehealth: Payer: Self-pay | Admitting: Nurse Practitioner

## 2018-08-06 NOTE — Telephone Encounter (Signed)
I attempted to call Patrick [name] for telehealth/telephone PC follow up scheduled visit. No answer and message left to return call. Will call and reschedule.

## 2018-08-07 ENCOUNTER — Other Ambulatory Visit: Payer: Self-pay | Admitting: Cardiovascular Disease

## 2018-08-10 ENCOUNTER — Telehealth: Payer: Self-pay | Admitting: Nurse Practitioner

## 2018-08-10 NOTE — Telephone Encounter (Signed)
Christin Gusler, NP requested that I call and reschedule the follow-up appointment that was set up for 08/06/18.  Called patient's wife with no answer.  Left message requesting a call back so that we could reschedule the telephone follow-up visit.

## 2018-08-17 DIAGNOSIS — J449 Chronic obstructive pulmonary disease, unspecified: Secondary | ICD-10-CM | POA: Diagnosis not present

## 2018-08-17 DIAGNOSIS — I4819 Other persistent atrial fibrillation: Secondary | ICD-10-CM | POA: Diagnosis not present

## 2018-08-17 DIAGNOSIS — N183 Chronic kidney disease, stage 3 (moderate): Secondary | ICD-10-CM | POA: Diagnosis not present

## 2018-08-17 DIAGNOSIS — I5042 Chronic combined systolic (congestive) and diastolic (congestive) heart failure: Secondary | ICD-10-CM | POA: Diagnosis not present

## 2018-08-20 DIAGNOSIS — E1122 Type 2 diabetes mellitus with diabetic chronic kidney disease: Secondary | ICD-10-CM | POA: Diagnosis not present

## 2018-08-20 DIAGNOSIS — D631 Anemia in chronic kidney disease: Secondary | ICD-10-CM | POA: Diagnosis not present

## 2018-08-20 DIAGNOSIS — I10 Essential (primary) hypertension: Secondary | ICD-10-CM | POA: Diagnosis not present

## 2018-08-20 DIAGNOSIS — R809 Proteinuria, unspecified: Secondary | ICD-10-CM | POA: Diagnosis not present

## 2018-08-20 DIAGNOSIS — I4891 Unspecified atrial fibrillation: Secondary | ICD-10-CM | POA: Diagnosis not present

## 2018-08-20 DIAGNOSIS — I5032 Chronic diastolic (congestive) heart failure: Secondary | ICD-10-CM | POA: Diagnosis not present

## 2018-08-20 DIAGNOSIS — E8809 Other disorders of plasma-protein metabolism, not elsewhere classified: Secondary | ICD-10-CM | POA: Diagnosis not present

## 2018-08-20 DIAGNOSIS — N2581 Secondary hyperparathyroidism of renal origin: Secondary | ICD-10-CM | POA: Diagnosis not present

## 2018-08-20 DIAGNOSIS — N184 Chronic kidney disease, stage 4 (severe): Secondary | ICD-10-CM | POA: Diagnosis not present

## 2018-08-21 ENCOUNTER — Telehealth: Payer: Self-pay | Admitting: Nurse Practitioner

## 2018-08-21 ENCOUNTER — Telehealth: Payer: Self-pay

## 2018-08-21 NOTE — Telephone Encounter (Signed)
Talked with patient's wife [name] and have rescheduled the Palliative f/u visit of 08/06/18 to 08/31/18 @ 3 PM.

## 2018-08-21 NOTE — Telephone Encounter (Signed)
   TELEPHONE CALL NOTE  This patient has been deemed a candidate for follow-up tele-health visit to limit community exposure during the Covid-19 pandemic. I spoke with the patient via phone to discuss instructions. The patient was advised to review the section on consent for treatment as well. The patient will receive a phone call 2-3 days prior to their E-Visit at which time consent will be verbally confirmed. A Virtual Office Visit appointment type has been scheduled for 08/24/2018 with Darylene Price FNP.  Gaylord Shih, [facility] 08/21/2018 11:57 AM

## 2018-08-21 NOTE — Telephone Encounter (Signed)
TELEPHONE CALL NOTE  Patrick [name] has been deemed a candidate for a follow-up tele-health visit to limit community exposure during the Covid-19 pandemic. I spoke with the patient via phone to ensure availability of phone/video source, confirm preferred email & phone number, discuss instructions and expectations, and review consent.   I reminded Patrick [name] to be prepared with any vital sign and/or heart rhythm information that could potentially be obtained via home monitoring, at the time of his visit.  Finally, I reminded Patrick [name] to expect an e-mail containing a link for their video-based visit approximately 15 minutes before his visit, or alternatively, a phone call at the time of his visit if his visit is planned to be a phone encounter.  Did the patient verbally consent to treatment as below? YES   Patrick [name], CMA 08/21/2018 11:57 AM  CONSENT FOR TELE-HEALTH VISIT - PLEASE REVIEW  I hereby voluntarily request, consent and authorize [facility] and its employed or contracted physicians, physician assistants, nurse practitioners or other licensed health care professionals (the Practitioner), to provide me with telemedicine health care services (the "Services") as deemed necessary by the treating Practitioner. I acknowledge and consent to receive the Services by the Practitioner via telemedicine. I understand that the telemedicine visit will involve communicating with the Practitioner through telephonic communication technology and the disclosure of certain medical information by electronic transmission. I acknowledge that I have been given the opportunity to request an in-person assessment or other available alternative prior to the telemedicine visit and am voluntarily participating in the telemedicine visit.  I understand that I have the right to withhold or withdraw my consent to the use of telemedicine in the course of my care at any  time, without affecting my right to future care or treatment, and that the Practitioner or I may terminate the telemedicine visit at any time. I understand that I have the right to inspect all information obtained and/or recorded in the course of the telemedicine visit and may receive copies of available information for a reasonable fee.  I understand that some of the potential risks of receiving the Services via telemedicine include:  Patrick [name] Delay or interruption in medical evaluation due to technological equipment failure or disruption; . Information transmitted may not be sufficient (e.g. poor resolution of images) to allow for appropriate medical decision making by the Practitioner; and/or  . In rare instances, security protocols could fail, causing a breach of personal health information.  Furthermore, I acknowledge that it is my responsibility to provide information about my medical history, conditions and care that is complete and accurate to the best of my ability. I acknowledge that Practitioner's advice, recommendations, and/or decision may be based on factors not within their control, such as incomplete or inaccurate data provided by me or lack of visual representation. I understand that the practice of medicine is not an exact science and that Practitioner makes no warranties or guarantees regarding treatment outcomes. I acknowledge that I will receive a copy of this consent concurrently upon execution via email to the email address I last provided but may also request a printed copy by calling the office of [facility].    I understand that my insurance may be billed for this visit.   I have read or had this consent read to me. . I understand the contents of this consent, which adequately explains the benefits and risks of the Services being provided via telemedicine.  Patrick [name]  I have been provided ample opportunity to ask questions regarding this consent and the Services and have had my  questions answered to my satisfaction. . I give my informed consent for the services to be provided through the use of telemedicine in my medical care  By participating in this telemedicine visit I agree to the above.

## 2018-08-24 ENCOUNTER — Other Ambulatory Visit: Payer: Self-pay

## 2018-08-24 ENCOUNTER — Ambulatory Visit: Payer: Medicare HMO | Attending: Family | Admitting: Family

## 2018-08-24 ENCOUNTER — Encounter: Payer: Self-pay | Admitting: Family

## 2018-08-24 VITALS — BP 156/85 | HR 53 | Wt 185.0 lb

## 2018-08-24 DIAGNOSIS — E1122 Type 2 diabetes mellitus with diabetic chronic kidney disease: Secondary | ICD-10-CM

## 2018-08-24 DIAGNOSIS — R809 Proteinuria, unspecified: Secondary | ICD-10-CM | POA: Diagnosis not present

## 2018-08-24 DIAGNOSIS — I5032 Chronic diastolic (congestive) heart failure: Secondary | ICD-10-CM

## 2018-08-24 DIAGNOSIS — I10 Essential (primary) hypertension: Secondary | ICD-10-CM

## 2018-08-24 DIAGNOSIS — N183 Chronic kidney disease, stage 3 unspecified: Secondary | ICD-10-CM

## 2018-08-24 DIAGNOSIS — I129 Hypertensive chronic kidney disease with stage 1 through stage 4 chronic kidney disease, or unspecified chronic kidney disease: Secondary | ICD-10-CM | POA: Diagnosis not present

## 2018-08-24 DIAGNOSIS — Z794 Long term (current) use of insulin: Secondary | ICD-10-CM

## 2018-08-24 NOTE — Progress Notes (Signed)
Virtual Visit via Telephone Note   Evaluation Performed:  Follow-up visit  This visit type was conducted due to national recommendations for restrictions regarding the COVID-19 Pandemic (e.g. social distancing).  This format is felt to be most appropriate for this patient at this time.  All issues noted in this document were discussed and addressed.  No physical exam was performed (except for noted visual exam findings with Video Visits).  Please refer to the patient's chart (MyChart message for video visits and phone note for telephone visits) for the patient's consent to telehealth for [facility]  Date:  08/24/2018   ID:  Patrick [name], DOB 1932-01-05, MRN [id number]  Patient Location:  [place] [address]   Provider location:   [facility] [address], [place] [address]  PCP:  Maryland Pink, MD  Cardiologist:  Ida Rogue, MD Electrophysiologist:  None   Chief Complaint:  Shortness of breath  History of Present Illness:    Patrick [name] is a 85 y.o. male who presents via audio/video conferencing for a telehealth visit today.  Patient verified DOB and address.  The patient does not have symptoms concerning for COVID-19 infection (fever, chills, cough, or new SHORTNESS OF BREATH).   Patient reports minimal shortness of breath upon moderate exertion. He describes this as chronic in nature having been present for several years. He has associated fatigue along with this. He denies any dizziness, swelling in his legs, chest pain, difficulty sleeping or weight gain. Wearing his oxygen at 2L around the clock.   Prior CV studies:   The following studies were reviewed today:  Echo report from 01/22/16 reviewed and showed an EF of 60-65% along with moderate/ severe PA pressure of 70 mm Hg.   Past Medical History:  Diagnosis Date  . Amputated finger   . Anemia of chronic disease    a. plan of  oncology to start Procrit - received during admission 12/2013  . Atypical chest pain    a. 12/2014 Neg CE - in setting of L pleural effusion.  . Bell's palsy   . Chronic combined systolic and diastolic CHF (congestive heart failure) ([place])    a. 11/2012 Echo: EF 60-65%, mod conc LVH, mildly dil LA/RA, mild Ao sclerosis w/o stenosis; b. 11/2014 Echo: EF 40-45%, prob mid-apicalanteroseptal, ant, apical HK, Gr2 DD, mod dil LA, mildly dil RA (technically difficult study); c. echo 11/2015: EF 60-65%, trivial AI, nl RV sys fxan, PASP 59; d. echo 10/17: EF 60-65%, no RWMA, mildly dilated LA, mildly reduced RV sys fxn, PASP 70  . CKD (chronic kidney disease), stage III (HCC)    a. stage III-IV  . Diabetes mellitus without complication ([place])   . GERD (gastroesophageal reflux disease)   . History of gastroesophageal reflux (GERD)   . Hyperlipidemia   . Hypertension   . Permanent atrial fibrillation    a. Dx 12/2011, Rate-controlled, chronic Xarelto (renal dosing) - CHA2DS2VASc = 5.  . Pleural effusion, left    a. 12/2014 s/p thoracentesis - protein <3, LDH 123, no malignancy.   Past Surgical History:  Procedure Laterality Date  . APPENDECTOMY    . COLONOSCOPY  09/2011  . ESOPHAGOGASTRODUODENOSCOPY    . FINGER SURGERY     right hand     Current Meds  Medication Sig  . amLODipine (NORVASC) 5 MG tablet Take 1 tablet (5 mg total) by mouth daily.  . cloNIDine (CATAPRES) 0.3 MG  tablet Take 1 tablet (0.3 mg total) by mouth 2 (two) times daily.  Marland Kitchen glucose blood (ACCU-CHEK SMARTVIEW) test strip Use one strip  To check  Glucose twice a day  . insulin NPH-regular Human (NOVOLIN 70/30) (70-30) 100 UNIT/ML injection Inject 35 Units into the skin 2 (two) times daily with a meal. (Patient taking differently: Inject 50 Units into the skin 2 (two) times daily with a meal. )  . morphine (ROXANOL) 20 MG/ML concentrated solution Take 0.25 mLs (5 mg total) by mouth every 4 (four) hours as needed for severe pain or  shortness of breath.  Marland Kitchen omeprazole (PRILOSEC) 40 MG capsule TAKE 1 CAPSULE EVERY DAY  . OXYGEN Inhale 2-4 L into the lungs continuous.  . potassium chloride (K-DUR) 10 MEQ tablet TAKE 1 TABLET TWICE DAILY (Patient taking differently: Take 10 mEq by mouth once. )  . Rivaroxaban (XARELTO) 15 MG TABS tablet Take 1 tablet (15 mg total) by mouth daily with supper.  . senna-docusate (SENOKOT-S) 8.6-50 MG tablet Take 2 tablets by mouth daily.  . sertraline (ZOLOFT) 50 MG tablet Take 50 mg by mouth daily.   Marland Kitchen torsemide (DEMADEX) 20 MG tablet Take 20 mg by mouth daily. 20mg  each morning and additional 20mg  as needed     Allergies:   Morphine   Social History   Tobacco Use  . Smoking status: Former Smoker    Packs/day: 0.25    Years: 10.00    Pack years: 2.50    Types: Cigars    Last attempt to quit: 05/06/1966    Years since quitting: 52.3  . Smokeless tobacco: Never Used  Substance Use Topics  . Alcohol use: No    Alcohol/week: 0.0 standard drinks  . Drug use: No     Family Hx: The patient's family history includes Diabetes in his mother and sister; Heart attack in his brother; Hypertension in his unknown relative.  ROS:   Please see the history of present illness.     All other systems reviewed and are negative.   Labs/Other Tests and Data Reviewed:    Recent Labs: 10/15/2017: Hemoglobin 11.5; Platelets 255   Recent Lipid Panel Lab Results  Component Value Date/Time   CHOL 81 01/03/2014 04:33 AM   TRIG 111 01/03/2014 04:33 AM   HDL 16 (L) 01/03/2014 04:33 AM   LDLCALC 43 01/03/2014 04:33 AM    Wt Readings from Last 3 Encounters:  08/24/18 185 lb (83.9 kg)  08/05/18 184 lb 6 oz (83.6 kg)  02/02/18 191 lb 6 oz (86.8 kg)     Exam:    Vital Signs:  BP (!) 156/85 Comment: self-reported  Pulse (!) 53 Comment: self-reported  Wt 185 lb (83.9 kg) Comment: self-reported  BMI 28.13 kg/m    Well nourished, well developed male in no  acute distress.   ASSESSMENT & PLAN:     1.Chronic heart failure with preserved ejection fraction-  - NYHA class II - euvolemic today based on patient's description of symptoms - weighing daily and he was reminded to call for an overnight weight gain of >2 pounds or weekly weight gain of >5 pounds.  - does try to stay active at home with doing some walking with his walker - not adding salt to his food - had telemedicine visit with cardiology [name]) 08/05/2018 - saw pulmonologist Alva Garnet) 05/17/16  2: HTN- - self-reported BP at home looks good - saw PCP Fulton Mole) 05/26/2018 & returns later this month - BMP from 06/08/2018 reviewed and  showed sodium 139, potassium 4.5, creatinine 1.8 and GFR 36  3: CKD, stage III-IV- - remains uninterested in dialysis - sees nephrology [facility]) later today  4: Diabetes- - glucose this morning was 160  - A1c on 06/08/2018 was 9.6%   COVID-19 Education: The signs and symptoms of COVID-19 were discussed with the patient and how to seek care for testing (follow up with PCP or arrange E-visit).  The importance of social distancing was discussed today.  Patient Risk:   After full review of this patients clinical status, I feel that they are at least moderate risk at this time.  Time:   Today, I have spent 12 minutes with the patient with telehealth technology discussing medications (with his wife), diet and symptoms to report.     Medication Adjustments/Labs and Tests Ordered: Current medicines are reviewed at length with the patient today.  Concerns regarding medicines are outlined above.   Tests Ordered: No orders of the defined types were placed in this encounter.  Medication Changes: No orders of the defined types were placed in this encounter.   Disposition:  Follow-up in 6 months or sooner for any questions/problems before then.   Signed, Alisa Graff, FNP  08/24/2018 9:37 AM    [facility]

## 2018-08-24 NOTE — Patient Instructions (Signed)
Continue weighing daily and call for an overnight weight gain of > 2 pounds or a weekly weight gain of >5 pounds. 

## 2018-08-31 ENCOUNTER — Telehealth: Payer: Self-pay | Admitting: Nurse Practitioner

## 2018-08-31 ENCOUNTER — Other Ambulatory Visit: Payer: Medicare HMO | Admitting: Nurse Practitioner

## 2018-08-31 ENCOUNTER — Other Ambulatory Visit: Payer: Self-pay

## 2018-08-31 NOTE — Telephone Encounter (Signed)
I called [name], Ms. Jasinski and she shared that she is currently not able to do the appointment she's at the grocery store. She's requesting that someone call and reschedule the palliative care appointment another time. Agreed and we'll have palliative Administration reschedule appointment

## 2018-09-01 ENCOUNTER — Telehealth: Payer: Self-pay | Admitting: Nurse Practitioner

## 2018-09-01 NOTE — Telephone Encounter (Signed)
Called patient's wife [name], as requested by Natalia Leatherwood, NP to reschedule the f/u Palliative visit that was scheduled for 08/31/18, there was no answer.  Left message with reason for call along with my name and contact information.

## 2018-09-10 ENCOUNTER — Telehealth: Payer: Self-pay | Admitting: Nurse Practitioner

## 2018-09-10 NOTE — Telephone Encounter (Signed)
Called patient's wife [name] to reschedule Palliative f/u visit - no answer, left message with reason for call along with my name and contact information.

## 2018-09-11 DIAGNOSIS — H2513 Age-related nuclear cataract, bilateral: Secondary | ICD-10-CM | POA: Diagnosis not present

## 2018-09-15 DIAGNOSIS — E785 Hyperlipidemia, unspecified: Secondary | ICD-10-CM | POA: Diagnosis not present

## 2018-09-15 DIAGNOSIS — I10 Essential (primary) hypertension: Secondary | ICD-10-CM | POA: Diagnosis not present

## 2018-09-15 DIAGNOSIS — R5381 Other malaise: Secondary | ICD-10-CM | POA: Diagnosis not present

## 2018-09-15 DIAGNOSIS — D631 Anemia in chronic kidney disease: Secondary | ICD-10-CM | POA: Diagnosis not present

## 2018-09-15 DIAGNOSIS — R5383 Other fatigue: Secondary | ICD-10-CM | POA: Diagnosis not present

## 2018-09-15 DIAGNOSIS — Z0000 Encounter for general adult medical examination without abnormal findings: Secondary | ICD-10-CM | POA: Diagnosis not present

## 2018-09-15 DIAGNOSIS — E1122 Type 2 diabetes mellitus with diabetic chronic kidney disease: Secondary | ICD-10-CM | POA: Diagnosis not present

## 2018-09-15 DIAGNOSIS — N183 Chronic kidney disease, stage 3 (moderate): Secondary | ICD-10-CM | POA: Diagnosis not present

## 2018-09-15 DIAGNOSIS — I129 Hypertensive chronic kidney disease with stage 1 through stage 4 chronic kidney disease, or unspecified chronic kidney disease: Secondary | ICD-10-CM | POA: Diagnosis not present

## 2018-09-17 ENCOUNTER — Telehealth: Payer: Self-pay | Admitting: Nurse Practitioner

## 2018-09-17 DIAGNOSIS — I4819 Other persistent atrial fibrillation: Secondary | ICD-10-CM | POA: Diagnosis not present

## 2018-09-17 DIAGNOSIS — J449 Chronic obstructive pulmonary disease, unspecified: Secondary | ICD-10-CM | POA: Diagnosis not present

## 2018-09-17 DIAGNOSIS — I5042 Chronic combined systolic (congestive) and diastolic (congestive) heart failure: Secondary | ICD-10-CM | POA: Diagnosis not present

## 2018-09-17 DIAGNOSIS — N183 Chronic kidney disease, stage 3 (moderate): Secondary | ICD-10-CM | POA: Diagnosis not present

## 2018-09-17 NOTE — Telephone Encounter (Signed)
Rec'd a call back from patient's wife [name], I asked her if she wanted to reschedule the f/u visit and she stated that they have decided to cancel Palliative services at this time.  I explained to her that if they changed their minds to please contact  Dr. Barbarann Ehlers office and request Palliative services, she was in agreement with this.

## 2018-09-17 NOTE — Telephone Encounter (Signed)
Called patient to try and reschedule  Palliative f/u visit, no answer.  Left message with reason for call and requesting a call back so that we can reschedule the visit or to let us know if they would like to continue with Palliative services or not.

## 2018-09-22 DIAGNOSIS — H2511 Age-related nuclear cataract, right eye: Secondary | ICD-10-CM | POA: Diagnosis not present

## 2018-09-22 DIAGNOSIS — I4891 Unspecified atrial fibrillation: Secondary | ICD-10-CM | POA: Diagnosis not present

## 2018-09-23 ENCOUNTER — Other Ambulatory Visit: Payer: Self-pay

## 2018-09-23 ENCOUNTER — Encounter: Payer: Self-pay | Admitting: *Deleted

## 2018-09-24 ENCOUNTER — Telehealth: Payer: Self-pay | Admitting: Cardiovascular Disease

## 2018-09-24 NOTE — Telephone Encounter (Signed)
Left voicemail message to call back  

## 2018-09-24 NOTE — Telephone Encounter (Signed)
Patient wife calling to request Dr. [name] fill out POA forms so she can sign for a procedure he needs.    Patient wife  advised of the difference between advanced directives poa and hpoa   Patient wife aware she can have these forms completed with any notary and a bank is a good resource for this.    Blank packet for AD/ Living will/ HCPOA  Placed at the front desk of medical mall for pick up   Please call patient if further assistance can be obtained

## 2018-09-25 ENCOUNTER — Other Ambulatory Visit
Admission: RE | Admit: 2018-09-25 | Discharge: 2018-09-25 | Disposition: A | Discharge status: Home / Self Care | Payer: Medicare HMO | Source: Ambulatory Visit | Attending: Ophthalmology | Admitting: Ophthalmology

## 2018-09-25 ENCOUNTER — Other Ambulatory Visit: Payer: Self-pay

## 2018-09-25 DIAGNOSIS — Z01812 Encounter for preprocedural laboratory examination: Secondary | ICD-10-CM | POA: Insufficient documentation

## 2018-09-25 DIAGNOSIS — Z1159 Encounter for screening for other viral diseases: Secondary | ICD-10-CM | POA: Diagnosis not present

## 2018-09-25 LAB — SARS CORONAVIRUS 2 (TAT 6-24 HRS): SARS Coronavirus 2: NEGATIVE

## 2018-09-26 NOTE — Anesthesia Preprocedure Evaluation (Addendum)
Anesthesia Evaluation  Patient identified by MRN, date of birth, ID band Patient awake    Reviewed: Allergy & Precautions, NPO status , Patient's Chart, lab work & pertinent test results  History of Anesthesia Complications Negative for: history of anesthetic complications  Airway Mallampati: IV     Mouth opening: Limited Mouth Opening Comment: Large tongue Dental  (+) Edentulous Upper, Edentulous Lower   Pulmonary COPD (on 2L home O2), former smoker (quit 1968),    Pulmonary exam normal breath sounds clear to auscultation       Cardiovascular hypertension, +CHF  + dysrhythmias (a fib on Xarelto, last dose 09/29/18)  Rhythm:Irregular Rate:Normal     Neuro/Psych PSYCHIATRIC DISORDERS Dementia negative neurological ROS     GI/Hepatic GERD  Medicated and Controlled,  Endo/Other  diabetes, Type 2, Insulin Dependent  Renal/GU Renal disease (stage III CKD)     Musculoskeletal   Abdominal   Peds  Hematology  (+) Blood dyscrasia, anemia ,   Anesthesia Other Findings   Reproductive/Obstetrics                            Anesthesia Physical Anesthesia Plan  ASA: IV  Anesthesia Plan: MAC   Post-op Pain Management:    Induction: Intravenous  PONV Risk Score and Plan: 1 and TIVA and Midazolam  Airway Management Planned: Natural Airway  Additional Equipment:   Intra-op Plan:   Post-operative Plan:   Informed Consent: I have reviewed the patients History and Physical, chart, labs and discussed the procedure including the risks, benefits and alternatives for the proposed anesthesia with the patient or authorized representative who has indicated his/her understanding and acceptance.     Consent reviewed with POA  Plan Discussed with: CRNA  Anesthesia Plan Comments:        Anesthesia Quick Evaluation

## 2018-09-28 NOTE — Discharge Instructions (Signed)

## 2018-09-29 NOTE — Telephone Encounter (Signed)
Returned call, pt not available at this time.

## 2018-09-29 NOTE — Telephone Encounter (Signed)
Call returned by wife, she reported that she picked up paperwork last Thursday. All questions were answered at that time and she does not have any further questions at this time.   Advised pt to call for any further questions or concerns.

## 2018-09-30 ENCOUNTER — Other Ambulatory Visit: Payer: Self-pay

## 2018-09-30 ENCOUNTER — Ambulatory Visit: Payer: Medicare HMO | Admitting: Anesthesiology

## 2018-09-30 ENCOUNTER — Encounter
Admission: RE | Disposition: A | Discharge status: Home / Self Care | Payer: Self-pay | Source: Home / Self Care | Attending: Ophthalmology

## 2018-09-30 ENCOUNTER — Ambulatory Visit
Admission: RE | Admit: 2018-09-30 | Discharge: 2018-09-30 | Disposition: A | Discharge status: Home / Self Care | Payer: Medicare HMO | Attending: Ophthalmology | Admitting: Ophthalmology

## 2018-09-30 DIAGNOSIS — H5211 Myopia, right eye: Secondary | ICD-10-CM | POA: Diagnosis not present

## 2018-09-30 DIAGNOSIS — Z794 Long term (current) use of insulin: Secondary | ICD-10-CM | POA: Insufficient documentation

## 2018-09-30 DIAGNOSIS — N183 Chronic kidney disease, stage 3 (moderate): Secondary | ICD-10-CM | POA: Diagnosis not present

## 2018-09-30 DIAGNOSIS — K219 Gastro-esophageal reflux disease without esophagitis: Secondary | ICD-10-CM | POA: Diagnosis not present

## 2018-09-30 DIAGNOSIS — Z9981 Dependence on supplemental oxygen: Secondary | ICD-10-CM | POA: Insufficient documentation

## 2018-09-30 DIAGNOSIS — I509 Heart failure, unspecified: Secondary | ICD-10-CM | POA: Diagnosis not present

## 2018-09-30 DIAGNOSIS — E1136 Type 2 diabetes mellitus with diabetic cataract: Secondary | ICD-10-CM | POA: Insufficient documentation

## 2018-09-30 DIAGNOSIS — Z7901 Long term (current) use of anticoagulants: Secondary | ICD-10-CM | POA: Insufficient documentation

## 2018-09-30 DIAGNOSIS — I4891 Unspecified atrial fibrillation: Secondary | ICD-10-CM | POA: Diagnosis not present

## 2018-09-30 DIAGNOSIS — Z79899 Other long term (current) drug therapy: Secondary | ICD-10-CM | POA: Diagnosis not present

## 2018-09-30 DIAGNOSIS — H5703 Miosis: Secondary | ICD-10-CM | POA: Diagnosis not present

## 2018-09-30 DIAGNOSIS — J449 Chronic obstructive pulmonary disease, unspecified: Secondary | ICD-10-CM | POA: Diagnosis not present

## 2018-09-30 DIAGNOSIS — I130 Hypertensive heart and chronic kidney disease with heart failure and stage 1 through stage 4 chronic kidney disease, or unspecified chronic kidney disease: Secondary | ICD-10-CM | POA: Diagnosis not present

## 2018-09-30 DIAGNOSIS — E1122 Type 2 diabetes mellitus with diabetic chronic kidney disease: Secondary | ICD-10-CM | POA: Diagnosis not present

## 2018-09-30 DIAGNOSIS — Z87891 Personal history of nicotine dependence: Secondary | ICD-10-CM | POA: Insufficient documentation

## 2018-09-30 DIAGNOSIS — H25811 Combined forms of age-related cataract, right eye: Secondary | ICD-10-CM | POA: Diagnosis not present

## 2018-09-30 DIAGNOSIS — Z885 Allergy status to narcotic agent status: Secondary | ICD-10-CM | POA: Insufficient documentation

## 2018-09-30 DIAGNOSIS — H2511 Age-related nuclear cataract, right eye: Secondary | ICD-10-CM | POA: Insufficient documentation

## 2018-09-30 HISTORY — PX: CATARACT EXTRACTION W/PHACO: SHX586

## 2018-09-30 HISTORY — DX: Disorientation, unspecified: R41.0

## 2018-09-30 HISTORY — DX: Chronic obstructive pulmonary disease, unspecified: J44.9

## 2018-09-30 LAB — GLUCOSE, CAPILLARY
Glucose-Capillary: 118 mg/dL — ABNORMAL HIGH (ref 70–99)
Glucose-Capillary: 121 mg/dL — ABNORMAL HIGH (ref 70–99)

## 2018-09-30 SURGERY — PHACOEMULSIFICATION, CATARACT, WITH IOL INSERTION
Anesthesia: Monitor Anesthesia Care | Site: Eye | Laterality: Right

## 2018-09-30 MED ORDER — NA HYALUR & NA CHOND-NA HYALUR 0.4-0.35 ML IO KIT
PACK | INTRAOCULAR | Status: DC | PRN
  Administered 2018-09-30: 1 mL via INTRAOCULAR

## 2018-09-30 MED ORDER — TETRACAINE HCL 0.5 % OP SOLN
1.0000 [drp] | OPHTHALMIC | Status: DC | PRN
  Administered 2018-09-30 (×2): 1 [drp] via OPHTHALMIC

## 2018-09-30 MED ORDER — LIDOCAINE HCL (PF) 2 % IJ SOLN
INTRAOCULAR | Status: DC | PRN
  Administered 2018-09-30: 1 mL

## 2018-09-30 MED ORDER — ACETAMINOPHEN 325 MG PO TABS: 650.0000 mg | ORAL_TABLET | Freq: Once | ORAL | Status: DC | PRN

## 2018-09-30 MED ORDER — EPINEPHRINE PF 1 MG/ML IJ SOLN
INTRAOCULAR | Status: DC | PRN
  Administered 2018-09-30: 09:00:00 70 mL via OPHTHALMIC

## 2018-09-30 MED ORDER — FENTANYL CITRATE (PF) 100 MCG/2ML IJ SOLN
INTRAMUSCULAR | Status: DC | PRN
  Administered 2018-09-30: 25 ug via INTRAVENOUS
  Administered 2018-09-30: 50 ug via INTRAVENOUS
  Administered 2018-09-30: 25 ug via INTRAVENOUS

## 2018-09-30 MED ORDER — BRIMONIDINE TARTRATE-TIMOLOL 0.2-0.5 % OP SOLN
OPHTHALMIC | Status: DC | PRN
  Administered 2018-09-30: 1 [drp] via OPHTHALMIC

## 2018-09-30 MED ORDER — ARMC OPHTHALMIC DILATING DROPS
1.0000 "application " | OPHTHALMIC | Status: DC | PRN
  Administered 2018-09-30 (×3): 1 via OPHTHALMIC

## 2018-09-30 MED ORDER — ONDANSETRON HCL 4 MG/2ML IJ SOLN: 4.0000 mg | Freq: Once | INTRAMUSCULAR | Status: DC | PRN

## 2018-09-30 MED ORDER — MOXIFLOXACIN HCL 0.5 % OP SOLN
1.0000 [drp] | OPHTHALMIC | Status: DC | PRN
  Administered 2018-09-30 (×3): 1 [drp] via OPHTHALMIC

## 2018-09-30 MED ORDER — ACETAMINOPHEN 160 MG/5ML PO SOLN: 325.0000 mg | ORAL | Status: DC | PRN

## 2018-09-30 MED ORDER — CEFUROXIME OPHTHALMIC INJECTION 1 MG/0.1 ML
INJECTION | OPHTHALMIC | Status: DC | PRN
  Administered 2018-09-30: 0.1 mL via INTRACAMERAL

## 2018-09-30 SURGICAL SUPPLY — 21 items
CANNULA ANT/CHMB 27G (MISCELLANEOUS) ×1 IMPLANT
CANNULA ANT/CHMB 27GA (MISCELLANEOUS) ×2 IMPLANT
GLOVE SURG LX 7.5 STRW (GLOVE) ×1
GLOVE SURG LX STRL 7.5 STRW (GLOVE) ×1 IMPLANT
GLOVE SURG TRIUMPH 8.0 PF LTX (GLOVE) ×2 IMPLANT
GOWN STRL REUS W/ TWL LRG LVL3 (GOWN DISPOSABLE) ×2 IMPLANT
GOWN STRL REUS W/TWL LRG LVL3 (GOWN DISPOSABLE) ×2
LENS IOL TECNIS ITEC 23.0 (Intraocular Lens) ×1 IMPLANT
MARKER SKIN DUAL TIP RULER LAB (MISCELLANEOUS) ×2 IMPLANT
NDL FILTER BLUNT 18X1 1/2 (NEEDLE) ×1 IMPLANT
NEEDLE FILTER BLUNT 18X 1/2SAF (NEEDLE) ×1
NEEDLE FILTER BLUNT 18X1 1/2 (NEEDLE) ×1 IMPLANT
PACK CATARACT BRASINGTON (MISCELLANEOUS) ×2 IMPLANT
PACK EYE AFTER SURG (MISCELLANEOUS) ×2 IMPLANT
PACK OPTHALMIC (MISCELLANEOUS) ×2 IMPLANT
RING MALYGIN 7.0 (MISCELLANEOUS) ×1 IMPLANT
SYR 3ML LL SCALE MARK (SYRINGE) ×2 IMPLANT
SYR 5ML LL (SYRINGE) ×2 IMPLANT
SYR TB 1ML LUER SLIP (SYRINGE) ×2 IMPLANT
WATER STERILE IRR 500ML POUR (IV SOLUTION) ×2 IMPLANT
WIPE NON LINTING 3.25X3.25 (MISCELLANEOUS) ×2 IMPLANT

## 2018-09-30 NOTE — H&P (Signed)

## 2018-09-30 NOTE — Op Note (Signed)
OPERATIVE NOTE  Patrick [name] [id number] 09/30/2018   PREOPERATIVE DIAGNOSIS:    Nuclear Sclerotic Cataract Right eye with miotic pupil.        H25.11  POSTOPERATIVE DIAGNOSIS: Nuclear Sclerotic Cataract Right eye with miotic pupil.          PROCEDURE:  Phacoemusification with posterior chamber intraocular lens placement of the right eye which required pupil stretching with the Malyugin pupil expansion device.  LENS:   Implant Name Type Inv. Item Serial No. Manufacturer Lot No. LRB No. Used Action  LENS IOL DIOP 23.0 - [id number] Intraocular Lens LENS IOL DIOP 23.0 [id number]  Right 1 Implanted       ULTRASOUND TIME: 13 % of 1 minutes 24 seconds, CDE 10.7  SURGEON:  Wyonia Hough, MD   ANESTHESIA:  Topical with tetracaine drops and 2% Xylocaine jelly, augmented with 1% preservative-free intracameral lidocaine.   COMPLICATIONS:  None.   DESCRIPTION OF PROCEDURE:  The patient was identified in the holding room and transported to the operating room and placed in the supine position under the operating microscope. Theright eye was identified as the operative eye and it was prepped and draped in the usual sterile ophthalmic fashion.   A 1 millimeter clear-corneal paracentesis was made at the 12:00 position.  0.5 ml of preservative-free 1% lidocaine was injected into the anterior chamber. The anterior chamber was filled with Viscoat viscoelastic.  A 2.4 millimeter keratome was used to make a near-clear corneal incision at the 9:00 position. A Malyugin pupil expander was then placed through the main incision and into the anterior chamber of the eye.  The edge of the iris was secured on the lip of the pupil expander and it was released, thereby expanding the pupil to approximately 7 millimeters for completion of the cataract surgery.  Additional Viscoat was placed in the anterior chamber.  A cystotome and capsulorrhexis forceps were used to make a curvilinear capsulorrhexis.    Balanced salt solution was used to hydrodissect and hydrodelineate the lens nucleus.   Phacoemulsification was used in stop and chop fashion to remove the lens, nucleus and epinucleus.  The remaining cortex was aspirated using the irrigation aspiration handpiece.  Additional Provisc was placed into the eye to distend the capsular bag for lens placement.  A lens was then injected into the capsular bag.  The pupil expanding ring was removed using a Kuglen hook and insertion device. The remaining viscoelastic was aspirated from the capsular bag and the anterior chamber.  The anterior chamber was filled with balanced salt solution to inflate to a physiologic pressure.  Wounds were hydrated with balanced salt solution.  The anterior chamber was inflated to a physiologic pressure with balanced salt solution.  No wound leaks were noted.Cefuroxime 0.1 ml of a 10mg /ml solution was injected into the anterior chamber for a dose of 1 mg of intracameral antibiotic at the completion of the case. Timolol and Brimonidine drops were applied to the eye.  The patient was taken to the recovery room in stable condition without complications of anesthesia or surgery.  Patrick [name] 09/30/2018, 8:42 AM

## 2018-09-30 NOTE — Anesthesia Postprocedure Evaluation (Signed)
Anesthesia Post Note  Patient: Patrick [name]  Procedure(s) Performed: CATARACT EXTRACTION PHACO AND INTRAOCULAR LENS PLACEMENT (IOC)COMPLICATED  RIGHT DIABETIC (Right Eye)  Patient location during evaluation: PACU Anesthesia Type: MAC Level of consciousness: awake and alert, oriented and patient cooperative Pain management: pain level controlled Vital Signs Assessment: post-procedure vital signs reviewed and stable Respiratory status: spontaneous breathing, nonlabored ventilation and respiratory function stable Cardiovascular status: blood pressure returned to baseline and stable Postop Assessment: adequate PO intake Anesthetic complications: no    Darrin Nipper

## 2018-09-30 NOTE — Transfer of Care (Signed)
Immediate Anesthesia Transfer of Care Note  Patient: Patrick [name]  Procedure(s) Performed: CATARACT EXTRACTION PHACO AND INTRAOCULAR LENS PLACEMENT (IOC)COMPLICATED  RIGHT DIABETIC (Right Eye)  Patient Location: PACU  Anesthesia Type: MAC  Level of Consciousness: awake, alert  and patient cooperative  Airway and Oxygen Therapy: Patient Spontanous Breathing and Patient connected to supplemental oxygen  Post-op Assessment: Post-op Vital signs reviewed, Patient's Cardiovascular Status Stable, Respiratory Function Stable, Patent Airway and No signs of Nausea or vomiting  Post-op Vital Signs: Reviewed and stable  Complications: No apparent anesthesia complications

## 2018-10-01 ENCOUNTER — Encounter: Payer: Self-pay | Admitting: Ophthalmology

## 2018-10-01 ENCOUNTER — Other Ambulatory Visit: Payer: Self-pay | Admitting: Cardiovascular Disease
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
## 2018-10-05 ENCOUNTER — Other Ambulatory Visit: Payer: Self-pay

## 2018-10-05 ENCOUNTER — Ambulatory Visit (INDEPENDENT_AMBULATORY_CARE_PROVIDER_SITE_OTHER): Payer: Medicare HMO | Admitting: Cardiovascular Disease

## 2018-10-05 VITALS — BP 130/74 | HR 63 | Ht 68.0 in | Wt 181.0 lb

## 2018-10-05 DIAGNOSIS — E782 Mixed hyperlipidemia: Secondary | ICD-10-CM

## 2018-10-05 DIAGNOSIS — Z794 Long term (current) use of insulin: Secondary | ICD-10-CM

## 2018-10-05 DIAGNOSIS — I4819 Other persistent atrial fibrillation: Secondary | ICD-10-CM

## 2018-10-05 DIAGNOSIS — I5032 Chronic diastolic (congestive) heart failure: Secondary | ICD-10-CM | POA: Diagnosis not present

## 2018-10-05 DIAGNOSIS — E1122 Type 2 diabetes mellitus with diabetic chronic kidney disease: Secondary | ICD-10-CM | POA: Diagnosis not present

## 2018-10-05 DIAGNOSIS — N183 Chronic kidney disease, stage 3 unspecified: Secondary | ICD-10-CM

## 2018-10-05 DIAGNOSIS — D638 Anemia in other chronic diseases classified elsewhere: Secondary | ICD-10-CM

## 2018-10-05 DIAGNOSIS — I951 Orthostatic hypotension: Secondary | ICD-10-CM | POA: Diagnosis not present

## 2018-10-05 DIAGNOSIS — I110 Hypertensive heart disease with heart failure: Secondary | ICD-10-CM

## 2018-10-05 DIAGNOSIS — I130 Hypertensive heart and chronic kidney disease with heart failure and stage 1 through stage 4 chronic kidney disease, or unspecified chronic kidney disease: Secondary | ICD-10-CM | POA: Diagnosis not present

## 2018-10-05 DIAGNOSIS — J439 Emphysema, unspecified: Secondary | ICD-10-CM | POA: Diagnosis not present

## 2018-10-05 DIAGNOSIS — I10 Essential (primary) hypertension: Secondary | ICD-10-CM

## 2018-10-05 NOTE — Patient Instructions (Addendum)

## 2018-10-06 DIAGNOSIS — E1165 Type 2 diabetes mellitus with hyperglycemia: Secondary | ICD-10-CM | POA: Diagnosis not present

## 2018-10-06 DIAGNOSIS — E785 Hyperlipidemia, unspecified: Secondary | ICD-10-CM | POA: Diagnosis not present

## 2018-10-06 DIAGNOSIS — Z794 Long term (current) use of insulin: Secondary | ICD-10-CM | POA: Diagnosis not present

## 2018-10-06 DIAGNOSIS — E1129 Type 2 diabetes mellitus with other diabetic kidney complication: Secondary | ICD-10-CM | POA: Diagnosis not present

## 2018-10-06 DIAGNOSIS — E1122 Type 2 diabetes mellitus with diabetic chronic kidney disease: Secondary | ICD-10-CM | POA: Diagnosis not present

## 2018-10-06 DIAGNOSIS — R809 Proteinuria, unspecified: Secondary | ICD-10-CM | POA: Diagnosis not present

## 2018-10-06 DIAGNOSIS — N183 Chronic kidney disease, stage 3 (moderate): Secondary | ICD-10-CM | POA: Diagnosis not present

## 2018-10-06 DIAGNOSIS — I10 Essential (primary) hypertension: Secondary | ICD-10-CM | POA: Diagnosis not present

## 2018-10-09 DIAGNOSIS — H2512 Age-related nuclear cataract, left eye: Secondary | ICD-10-CM | POA: Diagnosis not present

## 2018-10-09 DIAGNOSIS — I4891 Unspecified atrial fibrillation: Secondary | ICD-10-CM | POA: Diagnosis not present

## 2018-10-15 ENCOUNTER — Other Ambulatory Visit: Payer: Self-pay

## 2018-10-15 ENCOUNTER — Other Ambulatory Visit: Payer: Medicare HMO

## 2018-10-15 NOTE — Anesthesia Preprocedure Evaluation (Addendum)
Anesthesia Evaluation  Patient identified by MRN, date of birth, ID band Patient awake    Reviewed: Allergy & Precautions, NPO status , Patient's Chart, lab work & pertinent test results  History of Anesthesia Complications Negative for: history of anesthetic complications  Airway Mallampati: IV     Mouth opening: Limited Mouth Opening Comment: Large tongue Dental  (+) Edentulous Upper, Edentulous Lower   Pulmonary COPD (on 2L home O2), former smoker,    Pulmonary exam normal breath sounds clear to auscultation       Cardiovascular hypertension, +CHF  + dysrhythmias (a fib on Xarelto, last dose 10/20/18)  Rhythm:Irregular Rate:Normal     Neuro/Psych PSYCHIATRIC DISORDERS Dementia negative neurological ROS     GI/Hepatic GERD  Medicated and Controlled,  Endo/Other  diabetes, Type 2, Insulin Dependent  Renal/GU Renal disease (stage III CKD)     Musculoskeletal   Abdominal   Peds  Hematology  (+) Blood dyscrasia, anemia ,   Anesthesia Other Findings Cardiology note 10/05/18:  ASSESSMENT AND PLAN:  Persistent atrial fibrillation ([place]) - Plan: EKG 12-Lead Rate well controlled, Xarelto 15 mg daily, no recent fall  No medication changes made  Chronic diastolic heart failure (HCC) Weight is down 15 pounds from September 2019 He does have pitting lower extremity edema, recommended extra torsemide after lunch for pitting edema -Unable to exclude component of dependent edema, sits most of the day  Essential hypertension Blood pressure is well controlled on today's visit. No changes made to the medications.  Mixed hyperlipidemia  on pravastatin,  goal LDL less than 70 Numbers at goal  Type 2 diabetes mellitus with other circulatory complication, with long-term current use of insulin (HCC) No regular exercise program, weight down 15 pounds  Anemia of chronic disease Lab work requested  CKD (chronic kidney  disease), stage III Stable renal function per patient's wife, followed by nephrology  Lab work requested  Lymphadenopathy followed by oncology CLL    Total encounter time more than 25 minutes  Greater than 50% was spent in counseling and coordination of care with the patient   Disposition:   F/U  6 months  Reproductive/Obstetrics                            Anesthesia Physical  Anesthesia Plan  ASA: IV  Anesthesia Plan: MAC   Post-op Pain Management:    Induction: Intravenous  PONV Risk Score and Plan: 1 and TIVA and Midazolam  Airway Management Planned: Natural Airway  Additional Equipment:   Intra-op Plan:   Post-operative Plan:   Informed Consent: I have reviewed the patients History and Physical, chart, labs and discussed the procedure including the risks, benefits and alternatives for the proposed anesthesia with the patient or authorized representative who has indicated his/her understanding and acceptance.     Consent reviewed with POA  Plan Discussed with: CRNA  Anesthesia Plan Comments:         Anesthesia Quick Evaluation

## 2018-10-15 NOTE — Discharge Instructions (Signed)

## 2018-10-16 ENCOUNTER — Other Ambulatory Visit
Admission: RE | Admit: 2018-10-16 | Discharge: 2018-10-16 | Disposition: A | Discharge status: Home / Self Care | Payer: Medicare HMO | Source: Ambulatory Visit | Attending: Ophthalmology | Admitting: Ophthalmology

## 2018-10-16 DIAGNOSIS — Z1159 Encounter for screening for other viral diseases: Secondary | ICD-10-CM | POA: Insufficient documentation

## 2018-10-17 DIAGNOSIS — I5042 Chronic combined systolic (congestive) and diastolic (congestive) heart failure: Secondary | ICD-10-CM | POA: Diagnosis not present

## 2018-10-17 DIAGNOSIS — I4819 Other persistent atrial fibrillation: Secondary | ICD-10-CM | POA: Diagnosis not present

## 2018-10-17 DIAGNOSIS — N183 Chronic kidney disease, stage 3 (moderate): Secondary | ICD-10-CM | POA: Diagnosis not present

## 2018-10-17 DIAGNOSIS — J449 Chronic obstructive pulmonary disease, unspecified: Secondary | ICD-10-CM | POA: Diagnosis not present

## 2018-10-17 LAB — SARS CORONAVIRUS 2 (TAT 6-24 HRS): SARS Coronavirus 2: NEGATIVE

## 2018-10-21 ENCOUNTER — Other Ambulatory Visit: Payer: Self-pay

## 2018-10-21 ENCOUNTER — Ambulatory Visit
Admission: RE | Admit: 2018-10-21 | Discharge: 2018-10-21 | Disposition: A | Discharge status: Home / Self Care | Payer: Medicare HMO | Attending: Ophthalmology | Admitting: Ophthalmology

## 2018-10-21 ENCOUNTER — Encounter
Admission: RE | Disposition: A | Discharge status: Home / Self Care | Payer: Self-pay | Source: Home / Self Care | Attending: Ophthalmology

## 2018-10-21 ENCOUNTER — Ambulatory Visit: Payer: Medicare HMO | Admitting: Anesthesiology

## 2018-10-21 DIAGNOSIS — Z79899 Other long term (current) drug therapy: Secondary | ICD-10-CM | POA: Insufficient documentation

## 2018-10-21 DIAGNOSIS — Z794 Long term (current) use of insulin: Secondary | ICD-10-CM | POA: Diagnosis not present

## 2018-10-21 DIAGNOSIS — J449 Chronic obstructive pulmonary disease, unspecified: Secondary | ICD-10-CM | POA: Insufficient documentation

## 2018-10-21 DIAGNOSIS — Z9981 Dependence on supplemental oxygen: Secondary | ICD-10-CM | POA: Insufficient documentation

## 2018-10-21 DIAGNOSIS — D759 Disease of blood and blood-forming organs, unspecified: Secondary | ICD-10-CM | POA: Insufficient documentation

## 2018-10-21 DIAGNOSIS — E1122 Type 2 diabetes mellitus with diabetic chronic kidney disease: Secondary | ICD-10-CM | POA: Diagnosis not present

## 2018-10-21 DIAGNOSIS — D631 Anemia in chronic kidney disease: Secondary | ICD-10-CM | POA: Insufficient documentation

## 2018-10-21 DIAGNOSIS — H5703 Miosis: Secondary | ICD-10-CM | POA: Diagnosis not present

## 2018-10-21 DIAGNOSIS — I4819 Other persistent atrial fibrillation: Secondary | ICD-10-CM | POA: Insufficient documentation

## 2018-10-21 DIAGNOSIS — H2512 Age-related nuclear cataract, left eye: Secondary | ICD-10-CM | POA: Diagnosis not present

## 2018-10-21 DIAGNOSIS — Z7901 Long term (current) use of anticoagulants: Secondary | ICD-10-CM | POA: Insufficient documentation

## 2018-10-21 DIAGNOSIS — F419 Anxiety disorder, unspecified: Secondary | ICD-10-CM | POA: Insufficient documentation

## 2018-10-21 DIAGNOSIS — K219 Gastro-esophageal reflux disease without esophagitis: Secondary | ICD-10-CM | POA: Diagnosis not present

## 2018-10-21 DIAGNOSIS — N183 Chronic kidney disease, stage 3 (moderate): Secondary | ICD-10-CM | POA: Diagnosis not present

## 2018-10-21 DIAGNOSIS — H25812 Combined forms of age-related cataract, left eye: Secondary | ICD-10-CM | POA: Diagnosis not present

## 2018-10-21 DIAGNOSIS — E782 Mixed hyperlipidemia: Secondary | ICD-10-CM | POA: Insufficient documentation

## 2018-10-21 DIAGNOSIS — Z87891 Personal history of nicotine dependence: Secondary | ICD-10-CM | POA: Diagnosis not present

## 2018-10-21 DIAGNOSIS — I130 Hypertensive heart and chronic kidney disease with heart failure and stage 1 through stage 4 chronic kidney disease, or unspecified chronic kidney disease: Secondary | ICD-10-CM | POA: Diagnosis not present

## 2018-10-21 DIAGNOSIS — R591 Generalized enlarged lymph nodes: Secondary | ICD-10-CM | POA: Insufficient documentation

## 2018-10-21 DIAGNOSIS — I5032 Chronic diastolic (congestive) heart failure: Secondary | ICD-10-CM | POA: Diagnosis not present

## 2018-10-21 HISTORY — PX: CATARACT EXTRACTION W/PHACO: SHX586

## 2018-10-21 LAB — GLUCOSE, CAPILLARY
Glucose-Capillary: 111 mg/dL — ABNORMAL HIGH (ref 70–99)
Glucose-Capillary: 115 mg/dL — ABNORMAL HIGH (ref 70–99)

## 2018-10-21 SURGERY — PHACOEMULSIFICATION, CATARACT, WITH IOL INSERTION
Anesthesia: Monitor Anesthesia Care | Site: Eye | Laterality: Left

## 2018-10-21 MED ORDER — LIDOCAINE HCL (PF) 2 % IJ SOLN
INTRAOCULAR | Status: DC | PRN
  Administered 2018-10-21: 2 mL

## 2018-10-21 MED ORDER — CEFUROXIME OPHTHALMIC INJECTION 1 MG/0.1 ML
INJECTION | OPHTHALMIC | Status: DC | PRN
  Administered 2018-10-21: 0.1 mL via INTRACAMERAL

## 2018-10-21 MED ORDER — BRIMONIDINE TARTRATE-TIMOLOL 0.2-0.5 % OP SOLN
OPHTHALMIC | Status: DC | PRN
  Administered 2018-10-21: 1 [drp] via OPHTHALMIC

## 2018-10-21 MED ORDER — FENTANYL CITRATE (PF) 100 MCG/2ML IJ SOLN
INTRAMUSCULAR | Status: DC | PRN
  Administered 2018-10-21: 50 ug via INTRAVENOUS

## 2018-10-21 MED ORDER — ERYTHROMYCIN 5 MG/GM OP OINT
TOPICAL_OINTMENT | OPHTHALMIC | Status: DC | PRN
  Administered 2018-10-21: 1 via OPHTHALMIC

## 2018-10-21 MED ORDER — ACETAMINOPHEN 325 MG PO TABS: 650.0000 mg | ORAL_TABLET | Freq: Once | ORAL | Status: DC | PRN

## 2018-10-21 MED ORDER — EPINEPHRINE PF 1 MG/ML IJ SOLN
INTRAOCULAR | Status: DC | PRN
  Administered 2018-10-21: 88 mL via OPHTHALMIC

## 2018-10-21 MED ORDER — ARMC OPHTHALMIC DILATING DROPS
1.0000 "application " | OPHTHALMIC | Status: DC | PRN
  Administered 2018-10-21 (×3): 1 via OPHTHALMIC

## 2018-10-21 MED ORDER — MOXIFLOXACIN HCL 0.5 % OP SOLN
1.0000 [drp] | OPHTHALMIC | Status: DC | PRN
  Administered 2018-10-21 (×3): 1 [drp] via OPHTHALMIC

## 2018-10-21 MED ORDER — TETRACAINE HCL 0.5 % OP SOLN
1.0000 [drp] | OPHTHALMIC | Status: DC | PRN
  Administered 2018-10-21 (×3): 1 [drp] via OPHTHALMIC

## 2018-10-21 MED ORDER — ACETAMINOPHEN 160 MG/5ML PO SOLN: 325.0000 mg | ORAL | Status: DC | PRN

## 2018-10-21 MED ORDER — ONDANSETRON HCL 4 MG/2ML IJ SOLN: 4.0000 mg | Freq: Once | INTRAMUSCULAR | Status: DC | PRN

## 2018-10-21 MED ORDER — NA HYALUR & NA CHOND-NA HYALUR 0.4-0.35 ML IO KIT
PACK | INTRAOCULAR | Status: DC | PRN
  Administered 2018-10-21: 1 mL via INTRAOCULAR

## 2018-10-21 SURGICAL SUPPLY — 21 items
CANNULA ANT/CHMB 27G (MISCELLANEOUS) ×1 IMPLANT
CANNULA ANT/CHMB 27GA (MISCELLANEOUS) ×3 IMPLANT
GLOVE SURG LX 7.5 STRW (GLOVE) ×2
GLOVE SURG LX STRL 7.5 STRW (GLOVE) ×1 IMPLANT
GLOVE SURG TRIUMPH 8.0 PF LTX (GLOVE) ×3 IMPLANT
GOWN STRL REUS W/ TWL LRG LVL3 (GOWN DISPOSABLE) ×2 IMPLANT
GOWN STRL REUS W/TWL LRG LVL3 (GOWN DISPOSABLE) ×4
LENS IOL TECNIS ITEC 23.5 (Intraocular Lens) ×2 IMPLANT
MARKER SKIN DUAL TIP RULER LAB (MISCELLANEOUS) ×3 IMPLANT
NDL FILTER BLUNT 18X1 1/2 (NEEDLE) ×1 IMPLANT
NEEDLE FILTER BLUNT 18X 1/2SAF (NEEDLE) ×2
NEEDLE FILTER BLUNT 18X1 1/2 (NEEDLE) ×1 IMPLANT
PACK CATARACT BRASINGTON (MISCELLANEOUS) ×3 IMPLANT
PACK EYE AFTER SURG (MISCELLANEOUS) ×3 IMPLANT
PACK OPTHALMIC (MISCELLANEOUS) ×3 IMPLANT
RING MALYGIN 7.0 (MISCELLANEOUS) ×2 IMPLANT
SYR 3ML LL SCALE MARK (SYRINGE) ×3 IMPLANT
SYR 5ML LL (SYRINGE) ×3 IMPLANT
SYR TB 1ML LUER SLIP (SYRINGE) ×3 IMPLANT
WATER STERILE IRR 500ML POUR (IV SOLUTION) ×3 IMPLANT
WIPE NON LINTING 3.25X3.25 (MISCELLANEOUS) ×3 IMPLANT

## 2018-10-21 NOTE — Anesthesia Procedure Notes (Signed)
Procedure Name: MAC Date/Time: 10/21/2018 8:40 AM Performed by: Cameron Ali, CRNA Pre-anesthesia Checklist: Patient identified, Emergency Drugs available, Suction available, Timeout performed and Patient being monitored Patient Re-evaluated:Patient Re-evaluated prior to induction Oxygen Delivery Method: Nasal cannula Placement Confirmation: positive ETCO2

## 2018-10-21 NOTE — H&P (Signed)

## 2018-10-21 NOTE — Op Note (Addendum)
OPERATIVE NOTE  Patrick [name] [id number] 10/21/2018   OPERATIVE NOTE  Patrick [name] [id number] 10/21/2018  PREOPERATIVE DIAGNOSIS:   Nuclear sclerotic cataract left eye with miotic pupil      H25.12   POSTOPERATIVE DIAGNOSIS:   Nuclear sclerotic cataract left eye with miotic pupil.     PROCEDURE:  Phacoemulsification with posterior chamber intraocular lens implantation of the left eye which required pupil stretching with the Malyugin pupil expansion device   LENS:   Implant Name Type Inv. Item Serial No. Manufacturer Lot No. LRB No. Used Action  LENS IOL DIOP 23.5 - [id number] Intraocular Lens LENS IOL DIOP 23.5 [id number] AMO  Left 1 Implanted        ULTRASOUND TIME: 10 % of 1 minutes, 34 seconds.  CDE 9.7   SURGEON:  Wyonia Hough, MD   ANESTHESIA: Topical with tetracaine drops and 2% Xylocaine jelly, augmented with 1% preservative-free intracameral lidocaine.   COMPLICATIONS:  None.   DESCRIPTION OF PROCEDURE:  The patient was identified in the holding room and transported to the operating room and placed in the supine position under the operating microscope.  The left eye was identified as the operative eye and it was prepped and draped in the usual sterile ophthalmic fashion.   A 1 millimeter clear-corneal paracentesis was made at the 1:30 position.  The anterior chamber was filled with Viscoat viscoelastic.  0.5 ml of preservative-free 1% lidocaine was injected into the anterior chamber.  A 2.4 millimeter keratome was used to make a near-clear corneal incision at the 10:30 position.  A Malyugin pupil expander was then placed through the main incision and into the anterior chamber of the eye.  The edge of the iris was secured on the lip of the pupil expander and it was released, thereby expanding the pupil to approximately 7 millimeters for completion of the cataract surgery.  Additional Viscoat was placed in the anterior chamber.  A cystotome and  capsulorrhexis forceps were used to make a curvilinear capsulorrhexis.   Balanced salt solution was used to hydrodissect and hydrodelineate the lens nucleus.   Phacoemulsification was used in stop and chop fashion to remove the lens, nucleus and epinucleus.  The remaining cortex was aspirated using the irrigation aspiration handpiece.  Additional Provisc was placed into the eye to distend the capsular bag for lens placement.  A lens was then injected into the capsular bag.  The pupil expanding ring was removed using a Kuglen hook and insertion device. The remaining viscoelastic was aspirated from the capsular bag and the anterior chamber.  The anterior chamber was filled with balanced salt solution to inflate to a physiologic pressure.   Wounds were hydrated with balanced salt solution.  The anterior chamber was inflated to a physiologic pressure with balanced salt solution.  No wound leaks were noted. Cefuroxime 0.1 ml of a 10mg /ml solution was injected into the anterior chamber for a dose of 1 mg of intracameral antibiotic at the completion of the case.   Timolol and Brimonidine drops were applied to the eye.  The patient was taken to the recovery room in stable condition without complications of anesthesia or surgery.  Zohra Clavel 10/21/2018, 12:23 PM

## 2018-10-21 NOTE — Anesthesia Postprocedure Evaluation (Signed)
Anesthesia Post Note  Patient: Patrick [name]  Procedure(s) Performed: CATARACT EXTRACTION PHACO AND INTRAOCULAR LENS PLACEMENT (IOC) COMPLICATED  LEFT DIABETIC INSULIN DEP (Left Eye)  Patient location during evaluation: PACU Anesthesia Type: MAC Level of consciousness: awake and alert, oriented and patient cooperative Pain management: pain level controlled Vital Signs Assessment: post-procedure vital signs reviewed and stable Respiratory status: spontaneous breathing, nonlabored ventilation and respiratory function stable Cardiovascular status: blood pressure returned to baseline and stable Postop Assessment: adequate PO intake Anesthetic complications: no    Darrin Nipper

## 2018-10-21 NOTE — Transfer of Care (Signed)
Immediate Anesthesia Transfer of Care Note  Patient: Patrick [name]  Procedure(s) Performed: CATARACT EXTRACTION PHACO AND INTRAOCULAR LENS PLACEMENT (IOC) COMPLICATED  LEFT DIABETIC INSULIN DEP (Left Eye)  Patient Location: PACU  Anesthesia Type: MAC  Level of Consciousness: awake, alert  and patient cooperative  Airway and Oxygen Therapy: Patient Spontanous Breathing and Patient connected to supplemental oxygen  Post-op Assessment: Post-op Vital signs reviewed, Patient's Cardiovascular Status Stable, Respiratory Function Stable, Patent Airway and No signs of Nausea or vomiting  Post-op Vital Signs: Reviewed and stable  Complications: No apparent anesthesia complications

## 2018-10-22 ENCOUNTER — Encounter: Payer: Self-pay | Admitting: Ophthalmology

## 2018-10-30 ENCOUNTER — Other Ambulatory Visit: Payer: Self-pay | Admitting: Cardiovascular Disease

## 2018-10-30 ENCOUNTER — Telehealth: Payer: Self-pay | Admitting: Cardiovascular Disease

## 2018-10-30 NOTE — Telephone Encounter (Signed)
potassium chloride (K-DUR) 10 MEQ tablet 180 tablet 0 10/30/2018    Sig: TAKE 1 TABLET TWICE DAILY   Sent to pharmacy as: potassium chloride (K-DUR) 10 MEQ tablet   E-Prescribing Status: Receipt confirmed by pharmacy (10/30/2018 9:54 AM EDT)   Pharmacy  [facility] [place], [place]

## 2018-10-30 NOTE — Telephone Encounter (Signed)
°*  STAT* If patient is at the pharmacy, call can be transferred to refill team.   1. Which medications need to be refilled? (please list name of each medication and dose if known) potassium chloride 10 meq bid  2. Which pharmacy/location (including street and city if local pharmacy) is medication to be sent to? [facility] order  3. Do they need a 30 day or 90 day supply? [place]

## 2018-11-03 ENCOUNTER — Telehealth: Payer: Self-pay | Admitting: Internal Medicine

## 2018-11-03 NOTE — Telephone Encounter (Signed)
Pt's daughter states that pt has been complaining more recently about being more winded and she would like to make appt. Since it has been a while since he was last seen. Daughter denies any other symptoms and he recently tested negative for covid which was done last week prior to eye surgery. Appt. Scheduled. Advised that she would need to take pt to ER if condition worsens. Understanding was verbalized.

## 2018-11-05 ENCOUNTER — Telehealth: Payer: Self-pay | Admitting: Internal Medicine

## 2018-11-05 NOTE — Telephone Encounter (Signed)
Call was returned. Pt's wife Patrick [name] was advised that the phone/virtual visits should not be a long term thing and if they would like to do it for this visit we could and then moving forward if something changes that it will be longer than anticipated changing doctors would still be an option. Pt's wife will discuss with pt to decide if they want a phone or virtual and call back. No further actions at this time.

## 2018-11-05 NOTE — Telephone Encounter (Signed)
Pt called back and says that they nowwant to see another doctor. If this is possible. Pleae advise .

## 2018-11-05 NOTE — Telephone Encounter (Signed)
Called and spoke to pt's daughter, paula.  Patrick [name] does not feel that pt would benefit from virtual or phone visit. She feels that pt needs to physically be seen in office.  She is requesting to switch providers.  I have made paula aware that we are currently trying to work out kinks with Dr. Zoila Shutter schedule, and we will contact her tomorrow for f/u.   Patrick [name] is requesting that we contact her directly at [phone]

## 2018-11-06 NOTE — Telephone Encounter (Signed)
Spoke with [place]. Advised that we have clarified the scheduling issues. Advised that it next visit would indeed be a phone visit but moving forward all future visits with the exception of suspected covid would be in person. [place] stated that they would like to cancel this visit and she will speak with her mother and they would call back to reschedule.

## 2018-11-09 ENCOUNTER — Telehealth: Payer: Self-pay | Admitting: Nurse Practitioner

## 2018-11-09 ENCOUNTER — Ambulatory Visit: Payer: Medicare HMO | Admitting: Internal Medicine

## 2018-11-09 NOTE — Telephone Encounter (Signed)
Spoke with daughter, Metro Kung regarding Palliative services and she was in agreement with this.  I have scheduled a [place] for 11/24/18 @ 3 PM.

## 2018-11-17 DIAGNOSIS — J449 Chronic obstructive pulmonary disease, unspecified: Secondary | ICD-10-CM | POA: Diagnosis not present

## 2018-11-17 DIAGNOSIS — N183 Chronic kidney disease, stage 3 (moderate): Secondary | ICD-10-CM | POA: Diagnosis not present

## 2018-11-17 DIAGNOSIS — I4819 Other persistent atrial fibrillation: Secondary | ICD-10-CM | POA: Diagnosis not present

## 2018-11-17 DIAGNOSIS — I5042 Chronic combined systolic (congestive) and diastolic (congestive) heart failure: Secondary | ICD-10-CM | POA: Diagnosis not present

## 2018-11-24 ENCOUNTER — Other Ambulatory Visit: Payer: Medicare HMO | Admitting: Nurse Practitioner

## 2018-11-24 ENCOUNTER — Other Ambulatory Visit: Payer: Self-pay

## 2018-11-24 ENCOUNTER — Encounter: Payer: Self-pay | Admitting: Nurse Practitioner

## 2018-11-24 DIAGNOSIS — Z515 Encounter for palliative care: Secondary | ICD-10-CM

## 2018-11-24 NOTE — Progress Notes (Signed)
[facility] [place] Consult Note Telephone: [phone]  Fax: [phone]  PATIENT NAME: Patrick [name] DOB: February 18, 1932 MRN: [id number]  PRIMARY CARE PROVIDER:   Maryland Pink, MD  REFERRING PROVIDER:  Maryland Pink, MD [address] [facility] [place],  [place] [address]  RESPONSIBLE PARTY:   Self; Domenick Chatmon, wife  RECOMMENDATIONS and PLAN:  1. ACP: full code and continue to discuss with family, ongoing discussion  2. Memory loss secondary to dementia. Continue with supportive measures as chronic disease remains Progressive.   3. Dyspneic R06.00 secondary to CHF/COPD  Continue daily weights, respiratory status, inhalation therapy with continous O2  4. Palliative care encounter Z51.5; Palliative medicine team will continue to support patient, patient's family, and medical team. Visit consisted of counseling and education dealing with the complex and emotionally intense issues of symptom management and palliative care in the setting of serious and potentially life-threatening illness  I spent 40 minutes providing this consultation,  from 3:00pm to 3:40pm. More than 50% of the time in this consultation was spent coordinating communication.   HISTORY OF PRESENT ILLNESS:  Patrick [name] is a 85 y.o. year old male with multiple medical problems including Diastolic and systolic congestive heart failure, atrial fibrillation, chronic kidney disease, diabetes, neuropathy, gastritis 9 / 29 / 2010 with EGD one gastric polyp, obesity, sleep disorder possible OSA, vitamin D deficiency, hypertension anemia of chronic disease, Bell's palsy, gerd, hyperlipidemia, amputated finger, appendectomy, lingular mass. Seen congestive heart failure clinic 11/11 / 2019 with lacks Echo done 10 / 30 / 2017 showing EF 60 to 65% without any aortic stenosis and tricuspid regurg with elevated PA pressures of 70 mm HG. This is an overall  improvement from previous echocardiogram what showed EF of 40 to 45%. He is followed by cardiologist Dr. Candis Musa and pulmonologist Dr. Jamal Collin. He is also followed by Nephrology for chronic kidney disease Dr Juleen China and not interested in dialysis. He has been followed by Dr. Grayland Ormond at hematology for iron deficiency anemia requiring iron infusions.7 / 29 / 2020 cataract surgery. 7 / 14 / 2020 same for diabetes. He does have dementia and wife is primary caregiver. Hemoglobin A1c on 6/23 / 2020 was 10.3. Continue with Nephrology appointments. I called daughter for schedule palliative care visit. We talked about purpose of palliative care visit and [name] in agreement. We talked about mr. Rice field and how he's been feeling. [name] endorses that the biggest concerns have been the changes in his cognition. His memory is become worsening, difficulty remembering events, family members which has concerned her. [name] endorses dementia does seem to be progressing. He does forget to wear his oxygen at times and has to be reminded. [name] endorses at times he is just confused overall. We talked about symptoms of pain which he does not seem to demonstrate. We talked about symptoms of shortness of breath which he does intermittently exhibit. [name] endorses the shortness of breath he does experience but not to the degree he was when he was under [employer]. [place] or says the oxygen that seemed to help him and he's not as active. He does ambulate with a walker. He denies symptoms of pain. [name] and I did talk with Miss price field at length about clinical condition, progression in the setting of chronic disease. We talked about realistic expectations with progression. We talked about medical goals of care and wishes are to focus more on Comfort. Ward Givens field has verbalized on  multiple occasions he does not want to go back to the hospital. We talked about recent cataract extractions for which he does go for day procedure  as he declines to stay overnight. We talked about medical goals of care. We talked about Hospice Services. We talked about the last time Mr Sherri Sear was on Hospice Services clinically what he was experiencing. [name] endorses the shortness of breath seems to be relatively stable at this time it's the memory confusion that's more concerning. We talked about chronic disease progression of dementia. We talked about role of palliative care and plan of care. We talked about scheduling follow-up palliative care visit in 4 weeks to revisit clinical condition. Discuss should he declined during that time to notify palliative care and can transition to hospice at that time. At present time he does appear stable with symptoms congestive heart failure, edema has been minimal and symptoms of dyspnea controlled with oxygen. Schedule palliative care visit set. Therapeutic listening and emotional support provided. Questions answered to satisfaction. Contact information provided.   Palliative Care was asked to help to continue to address goals of care.   CODE STATUS: Full code  PPS: 40% HOSPICE ELIGIBILITY/DIAGNOSIS: TBD  PAST MEDICAL HISTORY:  Past Medical History:  Diagnosis Date  . Amputated finger   . Anemia of chronic disease    a. plan of oncology to start Procrit - received during admission 12/2013  . Atypical chest pain    a. 12/2014 Neg CE - in setting of L pleural effusion.  . Bell's palsy   . Chronic combined systolic and diastolic CHF (congestive heart failure) ([place])    a. 11/2012 Echo: EF 60-65%, mod conc LVH, mildly dil LA/RA, mild Ao sclerosis w/o stenosis; b. 11/2014 Echo: EF 40-45%, prob mid-apicalanteroseptal, ant, apical HK, Gr2 DD, mod dil LA, mildly dil RA (technically difficult study); c. echo 11/2015: EF 60-65%, trivial AI, nl RV sys fxan, PASP 59; d. echo 10/17: EF 60-65%, no RWMA, mildly dilated LA, mildly reduced RV sys fxn, PASP 70  . CKD (chronic kidney disease), stage III (HCC)    a.  stage III-IV  . Confusion    mild  . COPD (chronic obstructive pulmonary disease) ([place])   . Diabetes mellitus without complication ([place])   . GERD (gastroesophageal reflux disease)   . History of gastroesophageal reflux (GERD)   . Hyperlipidemia   . Hypertension   . Permanent atrial fibrillation    a. Dx 12/2011, Rate-controlled, chronic Xarelto (renal dosing) - CHA2DS2VASc = 5.  . Pleural effusion, left    a. 12/2014 s/p thoracentesis - protein <3, LDH 123, no malignancy.    SOCIAL HX:  Social History   Tobacco Use  . Smoking status: Former Smoker    Packs/day: 0.25    Years: 10.00    Pack years: 2.50    Types: Cigars    Quit date: 05/06/1966    Years since quitting: 52.5  . Smokeless tobacco: Never Used  Substance Use Topics  . Alcohol use: No    Alcohol/week: 0.0 standard drinks    ALLERGIES:  Allergies  Allergen Reactions  . Morphine     Out of it while in the hospital     PERTINENT MEDICATIONS:  Outpatient Encounter Medications as of 11/24/2018  Medication Sig  . amLODipine (NORVASC) 5 MG tablet Take 1 tablet (5 mg total) by mouth daily.  . cloNIDine (CATAPRES) 0.3 MG tablet Take 1 tablet (0.3 mg total) by mouth 2 (two) times daily.  Marland Kitchen glucose  blood (ACCU-CHEK SMARTVIEW) test strip Use one strip  To check  Glucose twice a day  . insulin NPH-regular Human (NOVOLIN 70/30) (70-30) 100 UNIT/ML injection Inject 35 Units into the skin 2 (two) times daily with a meal. (Patient taking differently: Inject 50 Units into the skin 2 (two) times daily with a meal. )  . losartan (COZAAR) 50 MG tablet Take 50 mg by mouth daily.  Marland Kitchen morphine 4 mg in bupivacaine 0.25 % 20 mL Inject into the articular space once.  Marland Kitchen omeprazole (PRILOSEC) 40 MG capsule TAKE 1 CAPSULE EVERY DAY  . OXYGEN Inhale 2-4 L into the lungs continuous.  . potassium chloride (K-DUR) 10 MEQ tablet TAKE 1 TABLET TWICE DAILY  . Rivaroxaban (XARELTO) 15 MG TABS tablet Take 1 tablet (15 mg total) by mouth daily with  supper.  . senna-docusate (SENOKOT-S) 8.6-50 MG tablet Take 2 tablets by mouth daily.  . sertraline (ZOLOFT) 50 MG tablet Take 50 mg by mouth daily.   Marland Kitchen torsemide (DEMADEX) 20 MG tablet TAKE 2 TABLETS (40 MG TOTAL) BY MOUTH 2 (TWO) TIMES DAILY AS NEEDED.   Facility-Administered Encounter Medications as of 11/24/2018  Medication  . epoetin alfa (EPOGEN,PROCRIT) injection 40,000 Units    PHYSICAL EXAM:   Deferred   Z , NP

## 2018-11-27 ENCOUNTER — Telehealth: Payer: Self-pay

## 2018-11-27 NOTE — Telephone Encounter (Signed)
Patrick [name] contacted SW. SW introduced self and role. Patrick [name] provided brief history on patient and patient's recent changes. Patient has become more confused and quickly. Patrick [name] provided examples of answering the remote instead of the phone and asking to go home. Patrick [name] said that family rotates care. Patrick [name] helps two days, her brother helps one day and patient's wife is there the other times. Patient's wife is working part-time so patient is alone for short periods two days a week. Discussed safety. Patient does not have a life alert necklace/bracelet. SW encouraged Patrick [name] to consider this. Patrick [name] said a neighbor is always home and can check if needed. Discussed in-home care options. Discussed patient's wife retiring or taking a break from work to be home. Discussed referral to hospice. Patrick [name] said that they are scheduled to have patient re-evaluated on October 8th. Discussed Medicare versus Medicaid. Patrick [name] is going to [employer] and will notify SW. SW provided emotional support, discussed resources and used active and reflective listening. SW also provided contact information for [employer]. Patrick [name] expressed appreciation for phone call and palliative care support.

## 2018-11-27 NOTE — Telephone Encounter (Signed)
SW attempted to contact patient's daughter, [name] at request of [place], NP. SW left VM and provided contact information.

## 2018-12-04 ENCOUNTER — Other Ambulatory Visit: Payer: Self-pay | Admitting: Cardiovascular Disease

## 2018-12-04 MED ORDER — RIVAROXABAN 15 MG PO TABS: 15.0000 mg | ORAL_TABLET | Freq: Every day | ORAL | 5 refills | Status: AC

## 2018-12-04 NOTE — Telephone Encounter (Signed)
°*  STAT* If patient is at the pharmacy, call can be transferred to refill team.   1. Which medications need to be refilled? (please list name of each medication and dose if known) Xarelto 15 mg daily  2. Which pharmacy/location (including street and city if local pharmacy) is medication to be sent to? [facility] on [address]  3. Do they need a 30 day or 90 day supply? 30 day

## 2018-12-04 NOTE — Telephone Encounter (Signed)
Last OV 10/05/2018 Actual BW crcl =32 ml/min

## 2018-12-04 NOTE — Telephone Encounter (Signed)
Refill Request.  

## 2018-12-18 DIAGNOSIS — I4819 Other persistent atrial fibrillation: Secondary | ICD-10-CM | POA: Diagnosis not present

## 2018-12-18 DIAGNOSIS — I5042 Chronic combined systolic (congestive) and diastolic (congestive) heart failure: Secondary | ICD-10-CM | POA: Diagnosis not present

## 2018-12-18 DIAGNOSIS — N183 Chronic kidney disease, stage 3 (moderate): Secondary | ICD-10-CM | POA: Diagnosis not present

## 2018-12-18 DIAGNOSIS — J449 Chronic obstructive pulmonary disease, unspecified: Secondary | ICD-10-CM | POA: Diagnosis not present

## 2018-12-21 ENCOUNTER — Ambulatory Visit: Payer: Self-pay | Admitting: Podiatry

## 2018-12-23 DIAGNOSIS — Z794 Long term (current) use of insulin: Secondary | ICD-10-CM | POA: Diagnosis not present

## 2018-12-23 DIAGNOSIS — I130 Hypertensive heart and chronic kidney disease with heart failure and stage 1 through stage 4 chronic kidney disease, or unspecified chronic kidney disease: Secondary | ICD-10-CM | POA: Diagnosis not present

## 2018-12-23 DIAGNOSIS — E1122 Type 2 diabetes mellitus with diabetic chronic kidney disease: Secondary | ICD-10-CM | POA: Diagnosis not present

## 2018-12-23 DIAGNOSIS — R809 Proteinuria, unspecified: Secondary | ICD-10-CM | POA: Diagnosis not present

## 2018-12-23 DIAGNOSIS — N183 Chronic kidney disease, stage 3 (moderate): Secondary | ICD-10-CM | POA: Diagnosis not present

## 2018-12-23 DIAGNOSIS — E1129 Type 2 diabetes mellitus with other diabetic kidney complication: Secondary | ICD-10-CM | POA: Diagnosis not present

## 2018-12-23 DIAGNOSIS — I5032 Chronic diastolic (congestive) heart failure: Secondary | ICD-10-CM | POA: Diagnosis not present

## 2018-12-23 DIAGNOSIS — E538 Deficiency of other specified B group vitamins: Secondary | ICD-10-CM | POA: Diagnosis not present

## 2018-12-31 ENCOUNTER — Other Ambulatory Visit: Payer: Medicare HMO | Admitting: Nurse Practitioner

## 2018-12-31 ENCOUNTER — Other Ambulatory Visit: Payer: Self-pay

## 2018-12-31 ENCOUNTER — Telehealth: Payer: Self-pay | Admitting: Nurse Practitioner

## 2018-12-31 NOTE — Telephone Encounter (Signed)
Rec'd a call from patient's daughter [name], she wanted to cancel today's f/u visit, when asked if she wanted to reschedule, she said she would call me back to reschedule when it was convenient with her Mom.  Notified NP

## 2019-01-06 ENCOUNTER — Other Ambulatory Visit: Payer: Self-pay | Admitting: Cardiovascular Disease

## 2019-01-07 ENCOUNTER — Other Ambulatory Visit: Payer: Self-pay | Admitting: Cardiovascular Disease

## 2019-01-13 DIAGNOSIS — R809 Proteinuria, unspecified: Secondary | ICD-10-CM | POA: Diagnosis not present

## 2019-01-13 DIAGNOSIS — E1121 Type 2 diabetes mellitus with diabetic nephropathy: Secondary | ICD-10-CM | POA: Diagnosis not present

## 2019-01-13 DIAGNOSIS — Z794 Long term (current) use of insulin: Secondary | ICD-10-CM | POA: Diagnosis not present

## 2019-01-13 DIAGNOSIS — E1129 Type 2 diabetes mellitus with other diabetic kidney complication: Secondary | ICD-10-CM | POA: Diagnosis not present

## 2019-01-13 DIAGNOSIS — N1831 Chronic kidney disease, stage 3a: Secondary | ICD-10-CM | POA: Diagnosis not present

## 2019-01-17 DIAGNOSIS — I5042 Chronic combined systolic (congestive) and diastolic (congestive) heart failure: Secondary | ICD-10-CM | POA: Diagnosis not present

## 2019-01-17 DIAGNOSIS — I4819 Other persistent atrial fibrillation: Secondary | ICD-10-CM | POA: Diagnosis not present

## 2019-01-17 DIAGNOSIS — J449 Chronic obstructive pulmonary disease, unspecified: Secondary | ICD-10-CM | POA: Diagnosis not present

## 2019-01-25 ENCOUNTER — Telehealth: Payer: Self-pay | Admitting: Nurse Practitioner

## 2019-01-25 NOTE — Telephone Encounter (Signed)
Patient's wife Eugenie Filler called me earlier to schedule a f/u visit for patient due to some issues/concerns and explained to wife that I would need to speak with NP to see if she could work him in.  NP was able to rearrange her schedule to see patient on 01/28/19 @ 9:30 AM via telehealth and wife was in agreement with this.

## 2019-01-28 ENCOUNTER — Encounter: Payer: Self-pay | Admitting: Nurse Practitioner

## 2019-01-28 ENCOUNTER — Other Ambulatory Visit: Payer: Self-pay

## 2019-01-28 ENCOUNTER — Other Ambulatory Visit: Payer: Medicare HMO | Admitting: Nurse Practitioner

## 2019-01-28 DIAGNOSIS — Z515 Encounter for palliative care: Secondary | ICD-10-CM

## 2019-01-28 NOTE — Progress Notes (Signed)
[facility] Consult Note Telephone: [phone]  Fax: [phone]  PATIENT NAME: Patrick [name] DOB: June 12, 1931 MRN: [id number]  PRIMARY CARE PROVIDER:   Maryland Pink, MD  REFERRING PROVIDER:  Maryland Pink, MD [address] [facility] [place],  [place] [address]  RESPONSIBLE PARTY:   Self; Emmeric Avetisyan, wife  Due to the COVID-19 crisis, this visit was done via telemedicine from my office and it was initiated and consent by this patient and or family.  RECOMMENDATIONS and PLAN:  1. ACP: full code and continue to discuss with family, ongoing discussion. Family is trying to decide to take to ED for further diagnostic testing, more knowledge to know if stoke or switch to comfort care with hospice services.   2. Memory loss secondary to dementia. Continue with supportive measures as chronic disease remains Progressive.   3. Dyspneic secondary toCHF/COPDContinue daily weights, respiratory status, inhalation therapy with continous O2  4. Palliative care encounter' Palliative medicine team will continue to support patient, patient's family, and medical team. Visit consisted of counseling and education dealing with the complex and emotionally intense issues of symptom management and palliative care in the setting of serious and potentially life-threatening illness  I spent 45 minutes providing this consultation,  from 10:00am to 10:40am. More than 50% of the time in this consultation was spent coordinating communication.   HISTORY OF PRESENT ILLNESS:  Patrick [name] is a 85 y.o. year old male with multiple medical problems including Diastolic and systolic congestive heart failure, atrial fibrillation, chronic kidney disease, diabetes, neuropathy,gastritis 9 / 29 / 2010 with EGD one gastric polyp,obesity,sleep disorder possible OSA, vitamin D deficiency, hypertension anemia of chronic disease, Bell's  palsy, gerd, hyperlipidemia, amputated finger, appendectomy, lingular mass. Scheduled the follow-up telemedicine visit plan for Patrick [name]. I called Patrick [name], his daughter to sign on to zoom link. We did telemedicine / video with with [name], [name] his daughter and wife Patrick [name]. We talked about clinical changes that have occurred over the last several days. Mr. Caspar has had a left facial droop which is very visible. Mr. Newgard is drooling. He is having trouble eating and swallowing. Mr. Rudkin is coughing with liquids and foods. Mr. Kneeland does sound audible congested. We talked about chronic disease progression. We talked about the possibility of him having Bell's Palsy which was what Patrick [name] was thinking. We talked about the possibility of him having a stroke which is more likely based on medical history, comorbidities, clinical presentation by video. Mr. [place] continues to walk with a walker, no recent falls. He does not appear to be dragging his feet per family. Family endorses that he does continue to require assistance for adl's, toileting. He does feed himself though has been taking a long time now with new facial droop. We talked about memory which has become worsening and confusion. We talked about symptoms of shortness of breath and edema which have them worsening also. We talked about medical goals of care including aggressive versus conservative versus comfort care. We talked about current clinical condition and possible stroke. We talked about options that were available to include taking Patrick [name] to the emergency department for further diagnostic testing to see if he has had a stroke for more information and treatment options with congestion to rule out aspiration pneumonia. We talked about if he had had a stroke they can opt to do a mission with rehab then discharged home with rehab and continue palliative care.  Second option decide to change focus to comfort  and Hospice Services. Mr [name] has had Hospice in the past so family is familiar with what services are provided. We talked at length about what aspiration pneumonia is should he have had a stroke. We talked about treatment options of antibiotic therapy oral vs IV. We talked about quality of life. He does remain on continuous oxygen. We talked about role of palliative care and plan of care. Patrick [name] and Patrick [name] ask to further discuss with family which direction they want to pursue and will return call today with their decision. Therapeutical listening and emotional support provided. Questions answered the satisfaction. Contact information provided.  Palliative Care was asked to help to continue to address goals of care.   CODE STATUS: DNR  PPS: 40% HOSPICE ELIGIBILITY/DIAGNOSIS: TBD  PAST MEDICAL HISTORY:  Past Medical History:  Diagnosis Date  . Amputated finger   . Anemia of chronic disease    a. plan of oncology to start Procrit - received during admission 12/2013  . Atypical chest pain    a. 12/2014 Neg CE - in setting of L pleural effusion.  . Bell's palsy   . Chronic combined systolic and diastolic CHF (congestive heart failure) ([place])    a. 11/2012 Echo: EF 60-65%, mod conc LVH, mildly dil LA/RA, mild Ao sclerosis w/o stenosis; b. 11/2014 Echo: EF 40-45%, prob mid-apicalanteroseptal, ant, apical HK, Gr2 DD, mod dil LA, mildly dil RA (technically difficult study); c. echo 11/2015: EF 60-65%, trivial AI, nl RV sys fxan, PASP 59; d. echo 10/17: EF 60-65%, no RWMA, mildly dilated LA, mildly reduced RV sys fxn, PASP 70  . CKD (chronic kidney disease), stage III    a. stage III-IV  . Confusion    mild  . COPD (chronic obstructive pulmonary disease) ([place])   . Diabetes mellitus without complication ([place])   . GERD (gastroesophageal reflux disease)   . History of gastroesophageal reflux (GERD)   . Hyperlipidemia   . Hypertension   . Permanent atrial fibrillation ([place])    a. Dx 12/2011,  Rate-controlled, chronic Xarelto (renal dosing) - CHA2DS2VASc = 5.  . Pleural effusion, left    a. 12/2014 s/p thoracentesis - protein <3, LDH 123, no malignancy.    SOCIAL HX:  Social History   Tobacco Use  . Smoking status: Former Smoker    Packs/day: 0.25    Years: 10.00    Pack years: 2.50    Types: Cigars    Quit date: 05/06/1966    Years since quitting: 52.7  . Smokeless tobacco: Never Used  Substance Use Topics  . Alcohol use: No    Alcohol/week: 0.0 standard drinks    ALLERGIES:  Allergies  Allergen Reactions  . Morphine     Out of it while in the hospital     PERTINENT MEDICATIONS:  Outpatient Encounter Medications as of 01/28/2019  Medication Sig  . amLODipine (NORVASC) 5 MG tablet Take 1 tablet (5 mg total) by mouth daily.  . cloNIDine (CATAPRES) 0.3 MG tablet Take 1 tablet (0.3 mg total) by mouth 2 (two) times daily.  Marland Kitchen glucose blood (ACCU-CHEK SMARTVIEW) test strip Use one strip  To check  Glucose twice a day  . insulin NPH-regular Human (NOVOLIN 70/30) (70-30) 100 UNIT/ML injection Inject 35 Units into the skin 2 (two) times daily with a meal. (Patient taking differently: Inject 50 Units into the skin 2 (two) times daily with a meal. )  . losartan (COZAAR) 50 MG tablet Take  50 mg by mouth daily.  Marland Kitchen morphine 4 mg in bupivacaine 0.25 % 20 mL Inject into the articular space once.  Marland Kitchen omeprazole (PRILOSEC) 40 MG capsule TAKE 1 CAPSULE EVERY DAY  . OXYGEN Inhale 2-4 L into the lungs continuous.  . potassium chloride (KLOR-CON) 10 MEQ tablet TAKE 1 TABLET TWICE DAILY  . Rivaroxaban (XARELTO) 15 MG TABS tablet Take 1 tablet (15 mg total) by mouth daily with supper.  . senna-docusate (SENOKOT-S) 8.6-50 MG tablet Take 2 tablets by mouth daily.  . sertraline (ZOLOFT) 50 MG tablet Take 50 mg by mouth daily.   Marland Kitchen torsemide (DEMADEX) 20 MG tablet TAKE 2 TABLETS (40 MG TOTAL) BY MOUTH 2 (TWO) TIMES DAILY AS NEEDED.   Facility-Administered Encounter Medications as of 01/28/2019   Medication  . epoetin alfa (EPOGEN,PROCRIT) injection 40,000 Units    PHYSICAL EXAM:   Deferred  Christin Z Gusler, NP

## 2019-02-02 ENCOUNTER — Telehealth: Payer: Self-pay

## 2019-02-02 ENCOUNTER — Telehealth: Payer: Self-pay | Admitting: Nurse Practitioner

## 2019-02-02 NOTE — Telephone Encounter (Signed)
Rec'd call from patient's wife wanting to know if I could get in touch with NP to see if she could come and see patient.  Wife explained that patient seemed okay but said that patient was drooling and she wanted NP to come and check patient.  I asked wife if it was okay if I contacted our Clinical Nurse Navigator, Maxwell Caul, RN and have her to call her back to find out more about what is going on and she agreed.  Notified Maxwell Caul RN via telephone and she will follow-up with wife.

## 2019-02-02 NOTE — Telephone Encounter (Signed)
Received update that patient's wife felt that patient has displayed changes in condition. Phone call placed to patient's wife, Jocelyn Lamer, to check in on patient. Jocelyn Lamer shared that she was feeling overwhelmed today. Provided active listening and support.  She requested that Palliative care begins visiting more routinely. Denies noting any changes in patient. Encouraged Jocelyn Lamer to call with changes or concerns.

## 2019-02-08 ENCOUNTER — Telehealth: Payer: Self-pay | Admitting: Nurse Practitioner

## 2019-02-08 NOTE — Telephone Encounter (Signed)
I called Patrick [name], Patrick [name] wife to schedule follow-up palliative care visit. Last palliative care visit family was discussing option of transitioning to hospice as he is Hospice eligible for [facility]. Patrick [name] endorses Patrick [name] still continues to overall decline, he's more symptomatic with shortness of breath and confusion. Patrick [name] endorses her wishes are for Hospice services. We talked about medical goals of care to focus on Comfort. But he endorses Patrick [name] is talking a lot about passing. Discuss with [name] will call Dr Hedrick's office for hospice order. Therapeutic listening and emotional support provided. Contact information. Questions answer satisfaction  Total time 25 minutes  Phone discussion 20 minutes Documentation 5 minutes

## 2019-02-09 ENCOUNTER — Other Ambulatory Visit: Payer: Self-pay

## 2019-02-09 ENCOUNTER — Emergency Department: Payer: Medicare HMO

## 2019-02-09 ENCOUNTER — Encounter: Payer: Self-pay | Admitting: *Deleted

## 2019-02-09 ENCOUNTER — Emergency Department
Admission: EM | Admit: 2019-02-09 | Discharge: 2019-02-09 | Disposition: A | Discharge status: Home / Self Care | Payer: Medicare HMO | Attending: Student in an Organized Health Care Education/Training Program | Admitting: Student in an Organized Health Care Education/Training Program

## 2019-02-09 DIAGNOSIS — N183 Chronic kidney disease, stage 3 unspecified: Secondary | ICD-10-CM | POA: Insufficient documentation

## 2019-02-09 DIAGNOSIS — R0602 Shortness of breath: Secondary | ICD-10-CM | POA: Diagnosis not present

## 2019-02-09 DIAGNOSIS — E1122 Type 2 diabetes mellitus with diabetic chronic kidney disease: Secondary | ICD-10-CM | POA: Insufficient documentation

## 2019-02-09 DIAGNOSIS — Z794 Long term (current) use of insulin: Secondary | ICD-10-CM | POA: Diagnosis not present

## 2019-02-09 DIAGNOSIS — I959 Hypotension, unspecified: Secondary | ICD-10-CM | POA: Diagnosis not present

## 2019-02-09 DIAGNOSIS — I130 Hypertensive heart and chronic kidney disease with heart failure and stage 1 through stage 4 chronic kidney disease, or unspecified chronic kidney disease: Secondary | ICD-10-CM | POA: Diagnosis not present

## 2019-02-09 DIAGNOSIS — R001 Bradycardia, unspecified: Secondary | ICD-10-CM | POA: Diagnosis not present

## 2019-02-09 DIAGNOSIS — I5042 Chronic combined systolic (congestive) and diastolic (congestive) heart failure: Secondary | ICD-10-CM | POA: Insufficient documentation

## 2019-02-09 DIAGNOSIS — Z79899 Other long term (current) drug therapy: Secondary | ICD-10-CM | POA: Insufficient documentation

## 2019-02-09 DIAGNOSIS — Z87891 Personal history of nicotine dependence: Secondary | ICD-10-CM | POA: Diagnosis not present

## 2019-02-09 DIAGNOSIS — Z66 Do not resuscitate: Secondary | ICD-10-CM | POA: Diagnosis not present

## 2019-02-09 DIAGNOSIS — R4182 Altered mental status, unspecified: Secondary | ICD-10-CM | POA: Diagnosis not present

## 2019-02-09 DIAGNOSIS — E876 Hypokalemia: Secondary | ICD-10-CM | POA: Insufficient documentation

## 2019-02-09 DIAGNOSIS — R404 Transient alteration of awareness: Secondary | ICD-10-CM | POA: Diagnosis not present

## 2019-02-09 DIAGNOSIS — R4020 Unspecified coma: Secondary | ICD-10-CM | POA: Diagnosis not present

## 2019-02-09 LAB — CBC WITH DIFFERENTIAL/PLATELET
Abs Immature Granulocytes: 0.03 10*3/uL (ref 0.00–0.07)
Basophils Absolute: 0 10*3/uL (ref 0.0–0.1)
Basophils Relative: 0 %
Eosinophils Absolute: 0.3 10*3/uL (ref 0.0–0.5)
Eosinophils Relative: 4 %
HCT: 37.8 % — ABNORMAL LOW (ref 39.0–52.0)
Hemoglobin: 12.5 g/dL — ABNORMAL LOW (ref 13.0–17.0)
Immature Granulocytes: 0 %
Lymphocytes Relative: 7 %
Lymphs Abs: 0.6 10*3/uL — ABNORMAL LOW (ref 0.7–4.0)
MCH: 29.3 pg (ref 26.0–34.0)
MCHC: 33.1 g/dL (ref 30.0–36.0)
MCV: 88.7 fL (ref 80.0–100.0)
Monocytes Absolute: 0.5 10*3/uL (ref 0.1–1.0)
Monocytes Relative: 6 %
Neutro Abs: 7 10*3/uL (ref 1.7–7.7)
Neutrophils Relative %: 83 %
Platelets: 238 10*3/uL (ref 150–400)
RBC: 4.26 MIL/uL (ref 4.22–5.81)
RDW: 13 % (ref 11.5–15.5)
WBC: 8.4 10*3/uL (ref 4.0–10.5)
nRBC: 0 % (ref 0.0–0.2)

## 2019-02-09 LAB — COMPREHENSIVE METABOLIC PANEL
ALT: 12 U/L (ref 0–44)
AST: 23 U/L (ref 15–41)
Albumin: 1.5 g/dL — ABNORMAL LOW (ref 3.5–5.0)
Alkaline Phosphatase: 43 U/L (ref 38–126)
Anion gap: 3 — ABNORMAL LOW (ref 5–15)
BUN: 13 mg/dL (ref 8–23)
CO2: 13 mmol/L — ABNORMAL LOW (ref 22–32)
Calcium: 4.1 mg/dL — CL (ref 8.9–10.3)
Chloride: 129 mmol/L — ABNORMAL HIGH (ref 98–111)
Creatinine, Ser: 0.97 mg/dL (ref 0.61–1.24)
GFR calc Af Amer: >60 mL/min (ref >60)
GFR calc non Af Amer: >60 mL/min (ref >60)
Glucose, Bld: 73 mg/dL (ref 70–99)
Potassium: <2 mmol/L — CL (ref 3.5–5.1)
Sodium: 145 mmol/L (ref 135–145)
Total Bilirubin: 1.6 mg/dL — ABNORMAL HIGH (ref 0.3–1.2)
Total Protein: <3 g/dL — ABNORMAL LOW (ref 6.5–8.1)

## 2019-02-09 NOTE — ED Triage Notes (Addendum)
Pt brought in via ems from home with unresponsiveness and sob.  Pt is being evaluated for hospice.   Hx copd.  Pt on 2liters oxygen.  Pt alert on arrival  Iv in place  md at bedside.

## 2019-02-09 NOTE — ED Provider Notes (Signed)
[facility] Emergency Department Provider Note    None    (approximate)  I have reviewed the triage vital signs and the nursing notes.   HISTORY  Chief Complaint Shortness of Breath  Level V Caveat: AMS  HPI Patrick [name] is a 85 y.o. male presents to the ER for evaluation of unresponsiveness.  Patient reportedly was being evaluated by home hospice nurse  today.  Was having episodes of bradycardia unresponsiveness and complaining of shortness of breath.  Home hospice nurse did not feel that they had adequate resources yet was concerned the patient was dying so patient was brought to the ER for symptomatic management and evaluation.  He denies any pain right now but just seems confused.   Past Medical History:  Diagnosis Date  . Amputated finger   . Anemia of chronic disease    a. plan of oncology to start Procrit - received during admission 12/2013  . Atypical chest pain    a. 12/2014 Neg CE - in setting of L pleural effusion.  . Bell's palsy   . Chronic combined systolic and diastolic CHF (congestive heart failure) ([place])    a. 11/2012 Echo: EF 60-65%, mod conc LVH, mildly dil LA/RA, mild Ao sclerosis w/o stenosis; b. 11/2014 Echo: EF 40-45%, prob mid-apicalanteroseptal, ant, apical HK, Gr2 DD, mod dil LA, mildly dil RA (technically difficult study); c. echo 11/2015: EF 60-65%, trivial AI, nl RV sys fxan, PASP 59; d. echo 10/17: EF 60-65%, no RWMA, mildly dilated LA, mildly reduced RV sys fxn, PASP 70  . CKD (chronic kidney disease), stage III    a. stage III-IV  . Confusion    mild  . COPD (chronic obstructive pulmonary disease) ([place])   . Diabetes mellitus without complication ([place])   . GERD (gastroesophageal reflux disease)   . History of gastroesophageal reflux (GERD)   . Hyperlipidemia   . Hypertension   . Permanent atrial fibrillation ([place])    a. Dx 12/2011, Rate-controlled, chronic Xarelto (renal dosing) - CHA2DS2VASc = 5.  . Pleural  effusion, left    a. 12/2014 s/p thoracentesis - protein <3, LDH 123, no malignancy.   Family History  Problem Relation Age of Onset  . Heart attack Brother   . Diabetes Mother   . Diabetes Sister   . Hypertension Other    Past Surgical History:  Procedure Laterality Date  . APPENDECTOMY    . CATARACT EXTRACTION W/PHACO Right 09/30/2018   Procedure: CATARACT EXTRACTION PHACO AND INTRAOCULAR LENS PLACEMENT (IOC)COMPLICATED  RIGHT DIABETIC;  Surgeon: Leandrew Koyanagi, MD;  Location: [place];  Service: Ophthalmology;  Laterality: Right;  MALYUGIN Diabetic - insulin Wife to accompany - confusion  . CATARACT EXTRACTION W/PHACO Left 10/21/2018   Procedure: CATARACT EXTRACTION PHACO AND INTRAOCULAR LENS PLACEMENT ([place]) COMPLICATED  LEFT DIABETIC INSULIN DEP;  Surgeon: Leandrew Koyanagi, MD;  Location: [place];  Service: Ophthalmology;  Laterality: Left;  MALYUGIN  . COLONOSCOPY  09/2011  . ESOPHAGOGASTRODUODENOSCOPY    . FINGER SURGERY     right hand   Patient Active Problem List   Diagnosis Date Noted  . Weakness generalized 06/24/2018  . Palliative care encounter 06/24/2018  . Shortness of breath 06/24/2018  . Chronic diastolic heart failure ([place]) 08/02/2016  . Sepsis ([place]) 06/30/2016  . Iron deficiency anemia 05/11/2016  . Anemia, chronic renal failure, stage 3 (moderate) 05/11/2016  . Lingular mass 02/06/2016  . Hypertensive heart disease 12/21/2015  . HCAP (healthcare-associated pneumonia) 11/20/2015  .  CKD (chronic kidney disease), stage III   . Orthostatic hypotension 08/08/2014  . Anemia of chronic disease   . Chronic renal disease 11/26/2012  . Persistent atrial fibrillation ([place]) 01/15/2012  . Essential hypertension 01/15/2012  . Mixed hyperlipidemia 01/15/2012  . Diabetes mellitus ([place]) 01/15/2012      Prior to Admission medications   Medication Sig Start Date End Date Taking? Authorizing Provider  amLODipine (NORVASC) 5 MG tablet Take  1 tablet (5 mg total) by mouth daily. 08/05/18 11/03/18  Minna Merritts, MD  cloNIDine (CATAPRES) 0.3 MG tablet Take 1 tablet (0.3 mg total) by mouth 2 (two) times daily. 12/12/17   Minna Merritts, MD  glucose blood (ACCU-CHEK SMARTVIEW) test strip Use one strip  To check  Glucose twice a day 06/05/17   Minna Merritts, MD  insulin NPH-regular Human (NOVOLIN 70/30) (70-30) 100 UNIT/ML injection Inject 35 Units into the skin 2 (two) times daily with a meal. Patient taking differently: Inject 50 Units into the skin 2 (two) times daily with a meal.  07/02/16   Gladstone Lighter, MD  losartan (COZAAR) 50 MG tablet Take 50 mg by mouth daily.    [provider]  morphine 4 mg in bupivacaine 0.25 % 20 mL Inject into the articular space once.    [provider]  omeprazole (PRILOSEC) 40 MG capsule TAKE 1 CAPSULE EVERY DAY 01/07/19   Minna Merritts, MD  OXYGEN Inhale 2-4 L into the lungs continuous.    [provider]  potassium chloride (KLOR-CON) 10 MEQ tablet TAKE 1 TABLET TWICE DAILY 01/07/19   Minna Merritts, MD  Rivaroxaban (XARELTO) 15 MG TABS tablet Take 1 tablet (15 mg total) by mouth daily with supper. 12/04/18   Minna Merritts, MD  senna-docusate (SENOKOT-S) 8.6-50 MG tablet Take 2 tablets by mouth daily.    [provider]  sertraline (ZOLOFT) 50 MG tablet Take 50 mg by mouth daily.     [provider]  torsemide (DEMADEX) 20 MG tablet TAKE 2 TABLETS (40 MG TOTAL) BY MOUTH 2 (TWO) TIMES DAILY AS NEEDED. 10/02/18   [name] Kathlene November, MD    Allergies Morphine    Social History Social History   Tobacco Use  . Smoking status: Former Smoker    Packs/day: 0.25    Years: 10.00    Pack years: 2.50    Types: Cigars    Quit date: 05/06/1966    Years since quitting: 52.8  . Smokeless tobacco: Never Used  Substance Use Topics  . Alcohol use: No    Alcohol/week: 0.0 standard drinks  . Drug use: No    Review of Systems Patient denies  headaches, rhinorrhea, blurry vision, numbness, shortness of breath, chest pain, edema, cough, abdominal pain, nausea, vomiting, diarrhea, dysuria, fevers, rashes or hallucinations unless otherwise stated above in HPI. ____________________________________________   PHYSICAL EXAM:  VITAL SIGNS: Vitals:   02/09/19 2040 02/09/19 2045  BP:    Pulse: (!) 58 (!) 56  Resp: 16 16  Temp:    SpO2: 100% 100%    Constitutional: Alert confused Eyes: Conjunctivae are normal.  Head: Atraumatic. Nose: No congestion/rhinnorhea. Mouth/Throat: Mucous membranes are moist.   Neck: No stridor. Painless ROM.  Cardiovascular: Normal rate, irregular rhythm. Grossly normal heart sounds.  Good peripheral circulation. Respiratory: Normal respiratory effort.  No retractions. Lungs with bibasilar crackles. Gastrointestinal: Soft and nontender. No distention. No abdominal bruits. No CVA tenderness. Genitourinary:  Musculoskeletal: No lower extremity tenderness nor edema.  No joint effusions. Neurologic:  Normal speech  No gross focal neurologic deficits are appreciated.  Skin:  Skin is warm, dry and intact. No rash noted. Psychiatric: Speech and behavior are normal.  ____________________________________________   LABS (all labs ordered are listed, but only abnormal results are displayed)  Results for orders placed or performed during the hospital encounter of 02/09/19 (from the past 24 hour(s))  CBC with Differential     Status: Abnormal   Collection Time: 02/09/19  7:50 PM  Result Value Ref Range   WBC 8.4 4.0 - 10.5 K/uL   RBC 4.26 4.22 - 5.81 MIL/uL   Hemoglobin 12.5 (L) 13.0 - 17.0 g/dL   HCT 37.8 (L) 39.0 - 52.0 %   MCV 88.7 80.0 - 100.0 fL   MCH 29.3 26.0 - 34.0 pg   MCHC 33.1 30.0 - 36.0 g/dL   RDW 13.0 11.5 - 15.5 %   Platelets 238 150 - 400 K/uL   nRBC 0.0 0.0 - 0.2 %   Neutrophils Relative % 83 %   Neutro Abs 7.0 1.7 - 7.7 K/uL   Lymphocytes Relative 7 %   Lymphs Abs 0.6 (L) 0.7 - 4.0  K/uL   Monocytes Relative 6 %   Monocytes Absolute 0.5 0.1 - 1.0 K/uL   Eosinophils Relative 4 %   Eosinophils Absolute 0.3 0.0 - 0.5 K/uL   Basophils Relative 0 %   Basophils Absolute 0.0 0.0 - 0.1 K/uL   Immature Granulocytes 0 %   Abs Immature Granulocytes 0.03 0.00 - 0.07 K/uL  Comprehensive metabolic panel     Status: Abnormal   Collection Time: 02/09/19  7:50 PM  Result Value Ref Range   Sodium 145 135 - 145 mmol/L   Potassium <2.0 (LL) 3.5 - 5.1 mmol/L   Chloride 129 (H) 98 - 111 mmol/L   CO2 13 (L) 22 - 32 mmol/L   Glucose, Bld 73 70 - 99 mg/dL   BUN 13 8 - 23 mg/dL   Creatinine, Ser 0.97 0.61 - 1.24 mg/dL   Calcium 4.1 (LL) 8.9 - 10.3 mg/dL   Total Protein <3.0 (L) 6.5 - 8.1 g/dL   Albumin 1.5 (L) 3.5 - 5.0 g/dL   AST 23 15 - 41 U/L   ALT 12 0 - 44 U/L   Alkaline Phosphatase 43 38 - 126 U/L   Total Bilirubin 1.6 (H) 0.3 - 1.2 mg/dL   GFR calc non Af Amer >60 >60 mL/min   GFR calc Af Amer >60 >60 mL/min   Anion gap 3 (L) 5 - 15   ____________________________________________  EKG My review and personal interpretation at Time: 19:54   Indication: unresponsiveness  Rate: 65  Rhythm: afib Axis: normal Other: normal intervals, no stemi ____________________________________________  RADIOLOGY  I personally reviewed all radiographic images ordered to evaluate for the above acute complaints and reviewed radiology reports and findings.  These findings were personally discussed with the patient.  Please see medical record for radiology report.  ____________________________________________   PROCEDURES  Procedure(s) performed:  Procedures    Critical Care performed: no ____________________________________________   INITIAL IMPRESSION / ASSESSMENT AND PLAN / ED COURSE  Pertinent labs & imaging results that were available during my care of the patient were reviewed by me and considered in my medical decision making (see chart for details).   DDX: pna, aspiration,  dysrhythmia, electrolyte abn,  Patrick [name] is a 85 y.o. who presents to the ED with above presentation.  Arrives with DNR  and was being evaluated for hospice care today.  Wife at bedside states that she and her family know that he is dying and he stopped eating.  Blood work shows evidence of critically low potassium as well as calcium.  He does not appear uncomfortable at this time they brought him to the ER because he was agitated and complaining of shortness of breath.  He is on his chronic 2 L of nasal cannula right now.  He does not have any complaints but appears confused which is reportedly baseline.  I strongly recommended hospitalization for comfort care measures due to my concern that the family may have difficulty managing his symptoms at home but they are adamant that they want him to be at home.  Apparently he made it quite clear that his wishes were to die at home with family.  His blood pressure is stable, oxygen saturation is stable.  I informed the family at bedside as well as several phone calls with the daughter who is a Dietitian of my concern that his death could be within hours and could be before hospice RN can be back at the house tomorrow morning.  She demonstrates understanding of this and states that she intends to take him home this evening.     The patient was evaluated in Emergency Department today for the symptoms described in the history of present illness. He/she was evaluated in the context of the global COVID-19 pandemic, which necessitated consideration that the patient might be at risk for infection with the SARS-CoV-2 virus that causes COVID-19. Institutional protocols and algorithms that pertain to the evaluation of patients at risk for COVID-19 are in a state of rapid change based on information released by regulatory bodies including the CDC and federal and state organizations. These policies and algorithms were followed during the patient's care in  the ED.  As part of my medical decision making, I reviewed the following data within the [place] notes reviewed and incorporated, Labs reviewed, notes from prior ED visits and [place] Controlled Substance Database   ____________________________________________   FINAL CLINICAL IMPRESSION(S) / ED DIAGNOSES  Final diagnoses:  Hypokalemia      NEW MEDICATIONS STARTED DURING THIS VISIT:  New Prescriptions   No medications on file     Note:  This document was prepared using Dragon voice recognition software and may include unintentional dictation errors.    Merlyn Lot, MD 02/09/19 2245

## 2019-02-09 NOTE — ED Notes (Signed)
ED Provider at bedside. 

## 2019-02-09 NOTE — ED Notes (Addendum)
Pt brought in via ems from home with sob, unresponsiveness earlier today. Pt is being evaluated for hospice.  pt has a DNR on arrival  Pt alert on arrival and talking.  Iv in place.  Pt on 2 liters oxygen via [place].  Hx copd.  Pt denies chest pain   afib on monitor.  md at bedside.

## 2019-02-09 NOTE — ED Notes (Signed)
md in with pt again.  Iv dc'ed.  Family with pt.

## 2019-02-09 NOTE — ED Notes (Signed)
Family with pt

## 2019-02-10 DIAGNOSIS — E1122 Type 2 diabetes mellitus with diabetic chronic kidney disease: Secondary | ICD-10-CM | POA: Diagnosis not present

## 2019-02-10 DIAGNOSIS — I69354 Hemiplegia and hemiparesis following cerebral infarction affecting left non-dominant side: Secondary | ICD-10-CM | POA: Diagnosis not present

## 2019-02-10 DIAGNOSIS — I5042 Chronic combined systolic (congestive) and diastolic (congestive) heart failure: Secondary | ICD-10-CM | POA: Diagnosis not present

## 2019-02-10 DIAGNOSIS — Z9981 Dependence on supplemental oxygen: Secondary | ICD-10-CM | POA: Diagnosis not present

## 2019-02-10 DIAGNOSIS — D631 Anemia in chronic kidney disease: Secondary | ICD-10-CM | POA: Diagnosis not present

## 2019-02-10 DIAGNOSIS — Z8669 Personal history of other diseases of the nervous system and sense organs: Secondary | ICD-10-CM | POA: Diagnosis not present

## 2019-02-10 DIAGNOSIS — J449 Chronic obstructive pulmonary disease, unspecified: Secondary | ICD-10-CM | POA: Diagnosis not present

## 2019-02-10 DIAGNOSIS — J9611 Chronic respiratory failure with hypoxia: Secondary | ICD-10-CM | POA: Diagnosis not present

## 2019-02-10 DIAGNOSIS — K219 Gastro-esophageal reflux disease without esophagitis: Secondary | ICD-10-CM | POA: Diagnosis not present

## 2019-02-10 DIAGNOSIS — N183 Chronic kidney disease, stage 3 unspecified: Secondary | ICD-10-CM | POA: Diagnosis not present

## 2019-02-10 DIAGNOSIS — F329 Major depressive disorder, single episode, unspecified: Secondary | ICD-10-CM | POA: Diagnosis not present

## 2019-02-10 DIAGNOSIS — E785 Hyperlipidemia, unspecified: Secondary | ICD-10-CM | POA: Diagnosis not present

## 2019-02-10 DIAGNOSIS — I130 Hypertensive heart and chronic kidney disease with heart failure and stage 1 through stage 4 chronic kidney disease, or unspecified chronic kidney disease: Secondary | ICD-10-CM | POA: Diagnosis not present

## 2019-02-10 DIAGNOSIS — I4891 Unspecified atrial fibrillation: Secondary | ICD-10-CM | POA: Diagnosis not present

## 2019-02-10 DIAGNOSIS — Z794 Long term (current) use of insulin: Secondary | ICD-10-CM | POA: Diagnosis not present

## 2019-02-11 DIAGNOSIS — J9611 Chronic respiratory failure with hypoxia: Secondary | ICD-10-CM | POA: Diagnosis not present

## 2019-02-11 DIAGNOSIS — I130 Hypertensive heart and chronic kidney disease with heart failure and stage 1 through stage 4 chronic kidney disease, or unspecified chronic kidney disease: Secondary | ICD-10-CM | POA: Diagnosis not present

## 2019-02-11 DIAGNOSIS — I5042 Chronic combined systolic (congestive) and diastolic (congestive) heart failure: Secondary | ICD-10-CM | POA: Diagnosis not present

## 2019-02-11 DIAGNOSIS — E1122 Type 2 diabetes mellitus with diabetic chronic kidney disease: Secondary | ICD-10-CM | POA: Diagnosis not present

## 2019-02-11 DIAGNOSIS — J449 Chronic obstructive pulmonary disease, unspecified: Secondary | ICD-10-CM | POA: Diagnosis not present

## 2019-02-11 DIAGNOSIS — N183 Chronic kidney disease, stage 3 unspecified: Secondary | ICD-10-CM | POA: Diagnosis not present

## 2019-02-13 DIAGNOSIS — J9611 Chronic respiratory failure with hypoxia: Secondary | ICD-10-CM | POA: Diagnosis not present

## 2019-02-13 DIAGNOSIS — N183 Chronic kidney disease, stage 3 unspecified: Secondary | ICD-10-CM | POA: Diagnosis not present

## 2019-02-13 DIAGNOSIS — I130 Hypertensive heart and chronic kidney disease with heart failure and stage 1 through stage 4 chronic kidney disease, or unspecified chronic kidney disease: Secondary | ICD-10-CM | POA: Diagnosis not present

## 2019-02-13 DIAGNOSIS — J449 Chronic obstructive pulmonary disease, unspecified: Secondary | ICD-10-CM | POA: Diagnosis not present

## 2019-02-13 DIAGNOSIS — E1122 Type 2 diabetes mellitus with diabetic chronic kidney disease: Secondary | ICD-10-CM | POA: Diagnosis not present

## 2019-02-13 DIAGNOSIS — I5042 Chronic combined systolic (congestive) and diastolic (congestive) heart failure: Secondary | ICD-10-CM | POA: Diagnosis not present

## 2019-02-15 DIAGNOSIS — E1122 Type 2 diabetes mellitus with diabetic chronic kidney disease: Secondary | ICD-10-CM | POA: Diagnosis not present

## 2019-02-15 DIAGNOSIS — I5042 Chronic combined systolic (congestive) and diastolic (congestive) heart failure: Secondary | ICD-10-CM | POA: Diagnosis not present

## 2019-02-15 DIAGNOSIS — J9611 Chronic respiratory failure with hypoxia: Secondary | ICD-10-CM | POA: Diagnosis not present

## 2019-02-15 DIAGNOSIS — J449 Chronic obstructive pulmonary disease, unspecified: Secondary | ICD-10-CM | POA: Diagnosis not present

## 2019-02-15 DIAGNOSIS — I130 Hypertensive heart and chronic kidney disease with heart failure and stage 1 through stage 4 chronic kidney disease, or unspecified chronic kidney disease: Secondary | ICD-10-CM | POA: Diagnosis not present

## 2019-02-15 DIAGNOSIS — N183 Chronic kidney disease, stage 3 unspecified: Secondary | ICD-10-CM | POA: Diagnosis not present

## 2019-02-17 DIAGNOSIS — I5042 Chronic combined systolic (congestive) and diastolic (congestive) heart failure: Secondary | ICD-10-CM | POA: Diagnosis not present

## 2019-02-17 DIAGNOSIS — I4819 Other persistent atrial fibrillation: Secondary | ICD-10-CM | POA: Diagnosis not present

## 2019-02-17 DIAGNOSIS — J449 Chronic obstructive pulmonary disease, unspecified: Secondary | ICD-10-CM | POA: Diagnosis not present

## 2019-02-23 DEATH — deceased

## 2019-03-15 ENCOUNTER — Ambulatory Visit: Payer: Medicare HMO | Admitting: Family

## 2019-05-22 ENCOUNTER — Other Ambulatory Visit: Payer: Self-pay | Admitting: Nurse Practitioner

## 2019-11-03 ENCOUNTER — Telehealth: Payer: Self-pay | Admitting: Oncology

## 2019-11-03 NOTE — Telephone Encounter (Signed)
Deceased patients wife left medical records request from [place] at front desk to the attention of Dr. Grayland Ormond and nurse.  After investigation it was determined that the request was received from [place] however it was at the attention of the deceased patients son Richrd Kuzniar.  Elta Guadeloupe is not listed in the patient charts as being a person we can release medical records to.  I called the wife Patrick [name] and explained that we would be unable to release the records to the attention of Mark.  She gave verbal authorization for [place] to release the records.  I also explained that she may need to contact Dr. Verneda Skill office and do the same thing as they were also listed on the request.  She states she will be going by there tomorrow.  I will notify our HIM specialist to release the requested records via fax.
